# Patient Record
Sex: Male | Born: 1960 | Race: Black or African American | Hispanic: No | Marital: Single | Smoking: Never smoker
Health system: Southern US, Community
[De-identification: ages and names within clinical notes are randomized; demographics above are authoritative.]

## PROBLEM LIST (undated history)

## (undated) DIAGNOSIS — E785 Hyperlipidemia, unspecified: Secondary | ICD-10-CM

## (undated) DIAGNOSIS — I639 Cerebral infarction, unspecified: Secondary | ICD-10-CM

## (undated) DIAGNOSIS — I10 Essential (primary) hypertension: Secondary | ICD-10-CM

## (undated) DIAGNOSIS — Z789 Other specified health status: Secondary | ICD-10-CM

## (undated) DIAGNOSIS — E871 Hypo-osmolality and hyponatremia: Secondary | ICD-10-CM

## (undated) DIAGNOSIS — E119 Type 2 diabetes mellitus without complications: Secondary | ICD-10-CM

## (undated) HISTORY — DX: Type 2 diabetes mellitus without complications: E11.9

## (undated) HISTORY — DX: Essential (primary) hypertension: I10

## (undated) HISTORY — PX: MANDIBLE FRACTURE SURGERY: SHX706

## (undated) HISTORY — DX: Cerebral infarction, unspecified: I63.9

## (undated) HISTORY — DX: Hypo-osmolality and hyponatremia: E87.1

## (undated) HISTORY — DX: Hyperlipidemia, unspecified: E78.5

## (undated) HISTORY — PX: NO PAST SURGERIES: SHX2092

---

## 2020-09-11 ENCOUNTER — Other Ambulatory Visit: Payer: Self-pay

## 2020-09-11 ENCOUNTER — Encounter: Payer: Self-pay | Admitting: Emergency Medicine

## 2020-09-11 ENCOUNTER — Inpatient Hospital Stay
Admission: EM | Admit: 2020-09-11 | Discharge: 2020-09-12 | DRG: 065 | Disposition: A | Discharge status: Other Acute Inpatient Hospital | Payer: BC Managed Care – PPO | Attending: Internal Medicine | Admitting: Internal Medicine

## 2020-09-11 ENCOUNTER — Emergency Department: Payer: BC Managed Care – PPO

## 2020-09-11 DIAGNOSIS — G8191 Hemiplegia, unspecified affecting right dominant side: Secondary | ICD-10-CM | POA: Diagnosis present

## 2020-09-11 DIAGNOSIS — Z20822 Contact with and (suspected) exposure to covid-19: Secondary | ICD-10-CM | POA: Diagnosis present

## 2020-09-11 DIAGNOSIS — I10 Essential (primary) hypertension: Secondary | ICD-10-CM | POA: Diagnosis not present

## 2020-09-11 DIAGNOSIS — H532 Diplopia: Secondary | ICD-10-CM | POA: Diagnosis present

## 2020-09-11 DIAGNOSIS — Z8673 Personal history of transient ischemic attack (TIA), and cerebral infarction without residual deficits: Secondary | ICD-10-CM

## 2020-09-11 DIAGNOSIS — R278 Other lack of coordination: Secondary | ICD-10-CM | POA: Diagnosis present

## 2020-09-11 DIAGNOSIS — I7410 Embolism and thrombosis of unspecified parts of aorta: Secondary | ICD-10-CM | POA: Diagnosis not present

## 2020-09-11 DIAGNOSIS — R471 Dysarthria and anarthria: Secondary | ICD-10-CM | POA: Diagnosis present

## 2020-09-11 DIAGNOSIS — I639 Cerebral infarction, unspecified: Secondary | ICD-10-CM

## 2020-09-11 DIAGNOSIS — R270 Ataxia, unspecified: Secondary | ICD-10-CM | POA: Diagnosis not present

## 2020-09-11 DIAGNOSIS — I517 Cardiomegaly: Secondary | ICD-10-CM | POA: Diagnosis not present

## 2020-09-11 DIAGNOSIS — I63441 Cerebral infarction due to embolism of right cerebellar artery: Secondary | ICD-10-CM

## 2020-09-11 DIAGNOSIS — Z841 Family history of disorders of kidney and ureter: Secondary | ICD-10-CM | POA: Diagnosis not present

## 2020-09-11 DIAGNOSIS — R9431 Abnormal electrocardiogram [ECG] [EKG]: Secondary | ICD-10-CM | POA: Diagnosis not present

## 2020-09-11 DIAGNOSIS — R7301 Impaired fasting glucose: Secondary | ICD-10-CM | POA: Diagnosis not present

## 2020-09-11 DIAGNOSIS — E785 Hyperlipidemia, unspecified: Secondary | ICD-10-CM | POA: Diagnosis not present

## 2020-09-11 DIAGNOSIS — R410 Disorientation, unspecified: Secondary | ICD-10-CM | POA: Diagnosis not present

## 2020-09-11 DIAGNOSIS — R29706 NIHSS score 6: Secondary | ICD-10-CM | POA: Diagnosis present

## 2020-09-11 DIAGNOSIS — I63549 Cerebral infarction due to unspecified occlusion or stenosis of unspecified cerebellar artery: Secondary | ICD-10-CM | POA: Diagnosis not present

## 2020-09-11 DIAGNOSIS — J189 Pneumonia, unspecified organism: Secondary | ICD-10-CM | POA: Diagnosis not present

## 2020-09-11 DIAGNOSIS — F1210 Cannabis abuse, uncomplicated: Secondary | ICD-10-CM | POA: Diagnosis present

## 2020-09-11 HISTORY — DX: Other specified health status: Z78.9

## 2020-09-11 LAB — URINALYSIS, COMPLETE (UACMP) WITH MICROSCOPIC
Bacteria, UA: NONE SEEN
Bilirubin Urine: NEGATIVE
Glucose, UA: NEGATIVE mg/dL
Ketones, ur: 5 mg/dL — AB
Leukocytes,Ua: NEGATIVE
Nitrite: NEGATIVE
Protein, ur: NEGATIVE mg/dL
Specific Gravity, Urine: 1.036 — ABNORMAL HIGH (ref 1.005–1.030)
Squamous Epithelial / HPF: NONE SEEN (ref 0–5)
pH: 6 (ref 5.0–8.0)

## 2020-09-11 LAB — BASIC METABOLIC PANEL
Anion gap: 15 (ref 5–15)
BUN: 12 mg/dL (ref 6–20)
CO2: 22 mmol/L (ref 22–32)
Calcium: 9.7 mg/dL (ref 8.9–10.3)
Chloride: 96 mmol/L — ABNORMAL LOW (ref 98–111)
Creatinine, Ser: 0.9 mg/dL (ref 0.61–1.24)
GFR, Estimated: >60 mL/min (ref >60)
Glucose, Bld: 186 mg/dL — ABNORMAL HIGH (ref 70–99)
Potassium: 3.8 mmol/L (ref 3.5–5.1)
Sodium: 133 mmol/L — ABNORMAL LOW (ref 135–145)

## 2020-09-11 LAB — CBC
HCT: 43.3 % (ref 39.0–52.0)
Hemoglobin: 14.7 g/dL (ref 13.0–17.0)
MCH: 32 pg (ref 26.0–34.0)
MCHC: 33.9 g/dL (ref 30.0–36.0)
MCV: 94.3 fL (ref 80.0–100.0)
Platelets: 335 10*3/uL (ref 150–400)
RBC: 4.59 MIL/uL (ref 4.22–5.81)
RDW: 12.8 % (ref 11.5–15.5)
WBC: 6.5 10*3/uL (ref 4.0–10.5)
nRBC: 0 % (ref 0.0–0.2)

## 2020-09-11 LAB — RESP PANEL BY RT-PCR (FLU A&B, COVID) ARPGX2
Influenza A by PCR: NEGATIVE
Influenza B by PCR: NEGATIVE
SARS Coronavirus 2 by RT PCR: NEGATIVE

## 2020-09-11 LAB — TROPONIN I (HIGH SENSITIVITY)
Troponin I (High Sensitivity): 30 ng/L — ABNORMAL HIGH (ref <18)
Troponin I (High Sensitivity): 52 ng/L — ABNORMAL HIGH (ref <18)

## 2020-09-11 LAB — PROTIME-INR
INR: 1 (ref 0.8–1.2)
Prothrombin Time: 13 seconds (ref 11.4–15.2)

## 2020-09-11 LAB — APTT: aPTT: 26 seconds (ref 24–36)

## 2020-09-11 IMAGING — CT CT HEAD W/O CM
3 series · 15 of 47 positions shown, 18 images · non-contrast
Comparison: None.

CLINICAL DATA: Confusion began yesterday. Difficulty with thoughts.
Tired. Numbness in the left upper extremity.

EXAM:
CT HEAD WITHOUT CONTRAST
TECHNIQUE: Contiguous axial images were obtained from the base of the skull
through the vertex without intravenous contrast.

[Series 3: head wo · axial · 0.41mm/px · z∈[-133,-8]mm · 9 of 30 slices shown, 12 images]
[im 3/30  brain]
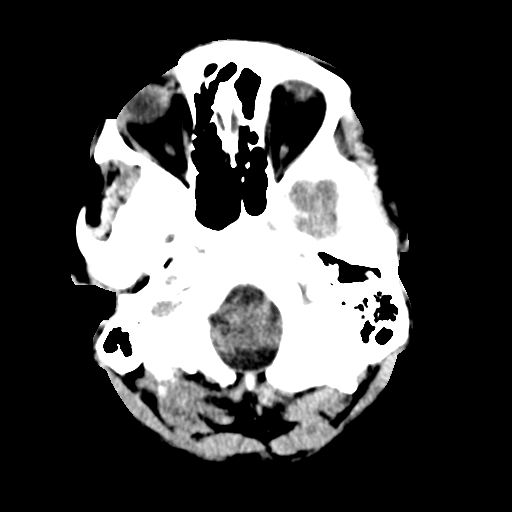
[im 3/30  bone]
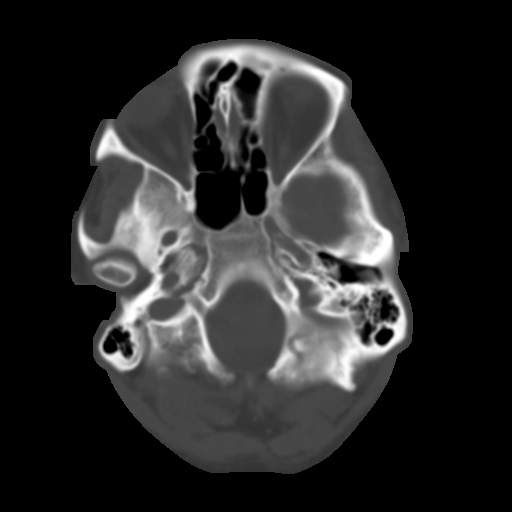
[im 6/30  brain]
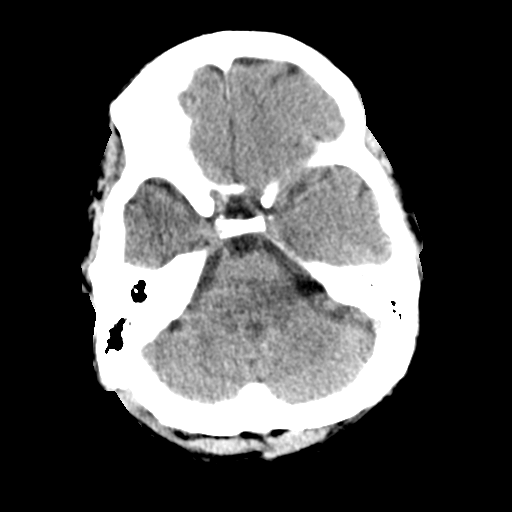
[im 9/30  brain]
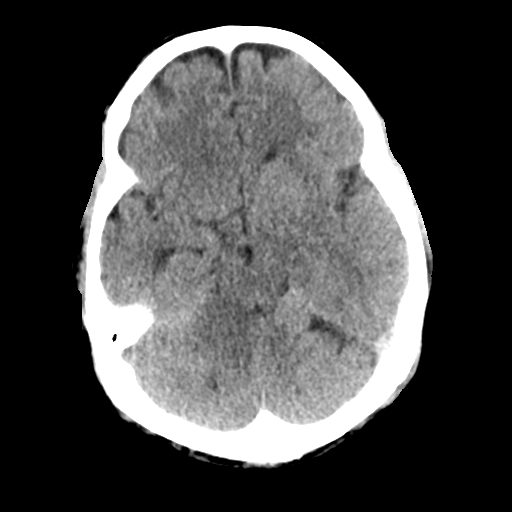
[im 12/30  brain]
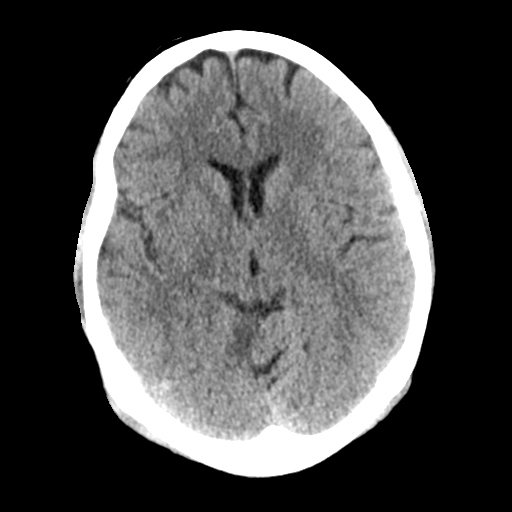
[im 16/30  brain]
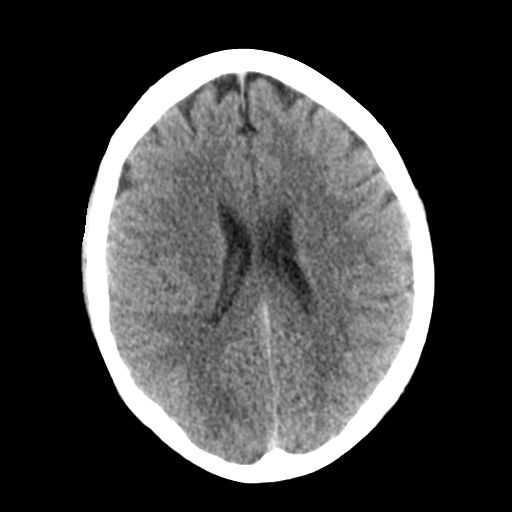
[im 16/30  bone]
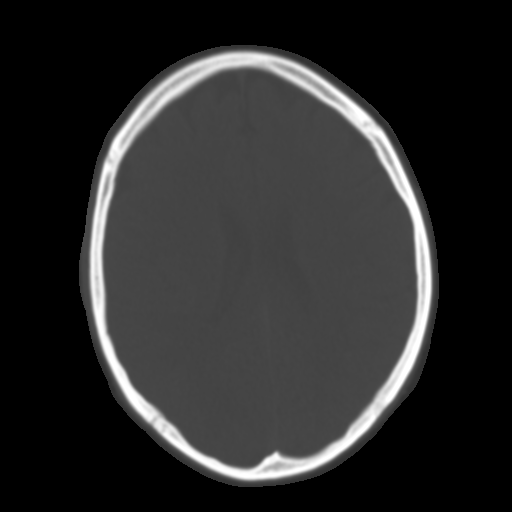
[im 19/30  brain]
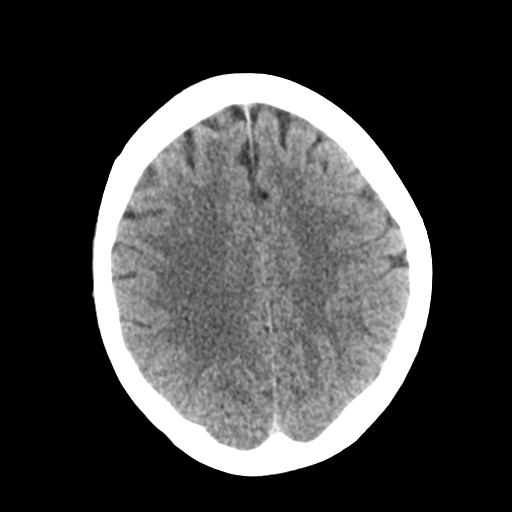
[im 22/30  brain]
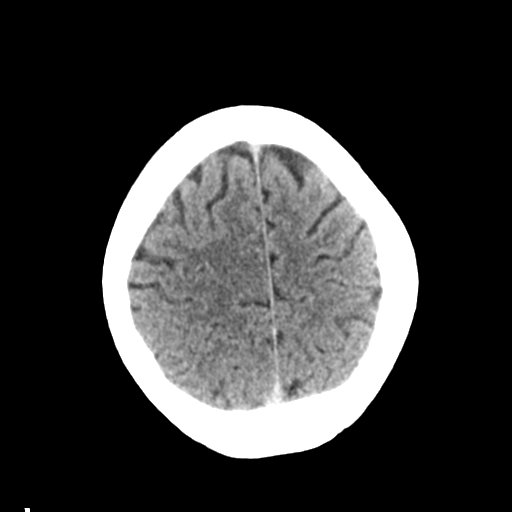
[im 25/30  brain]
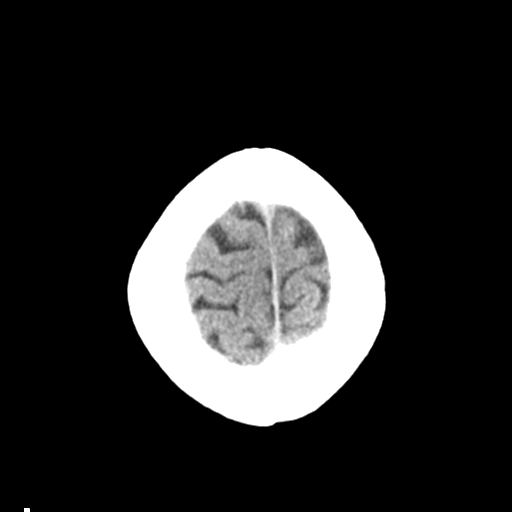
[im 28/30  brain]
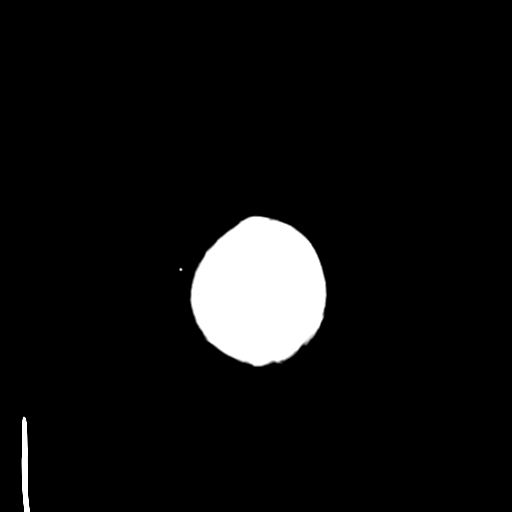
[im 28/30  bone]
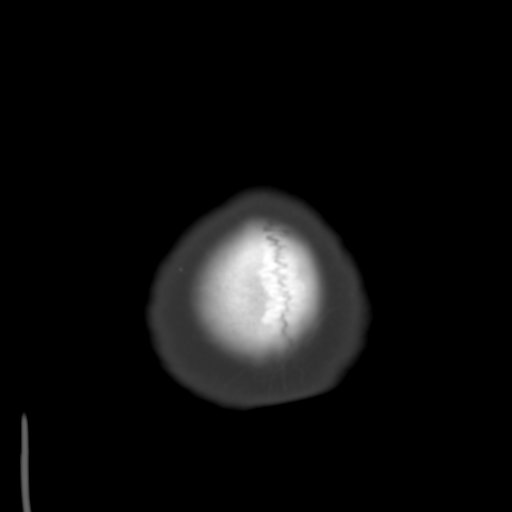

[Series 4: coronal soft tissue · coronal · 0.33mm/px · 3 of 67 slices shown]
[im 23/67  brain]
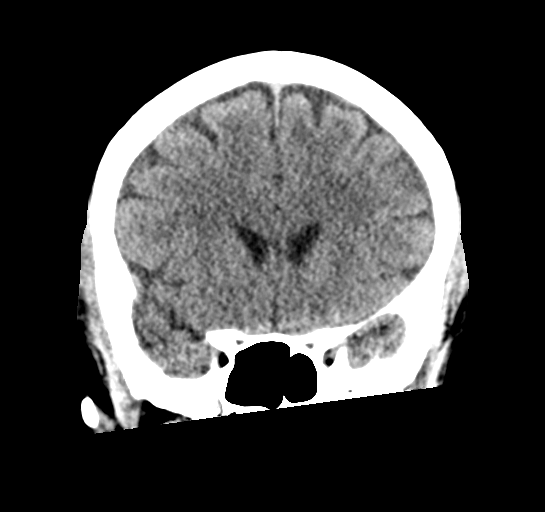
[im 30/67  brain]
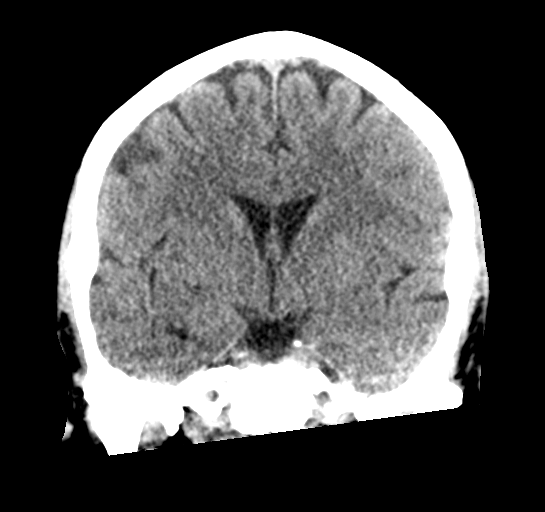
[im 37/67  brain]
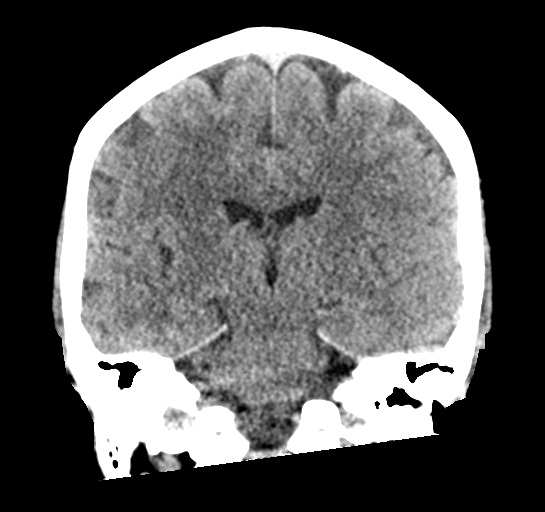

[Series 5: sagittal soft tissue · sagittal · 0.33mm/px · 3 of 56 slices shown]
[im 19/56  brain]
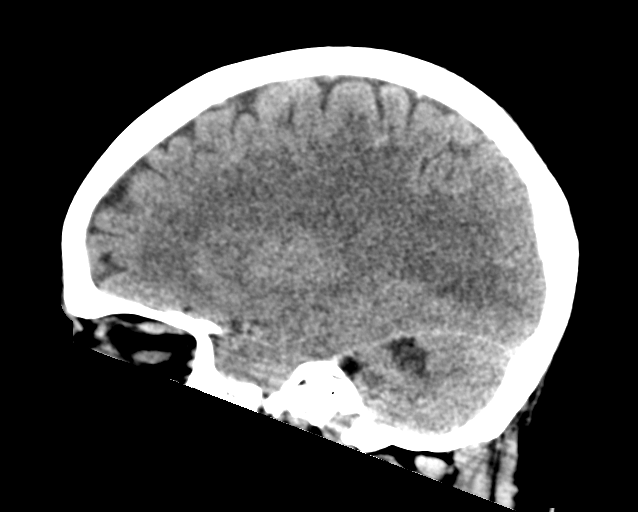
[im 28/56  brain]
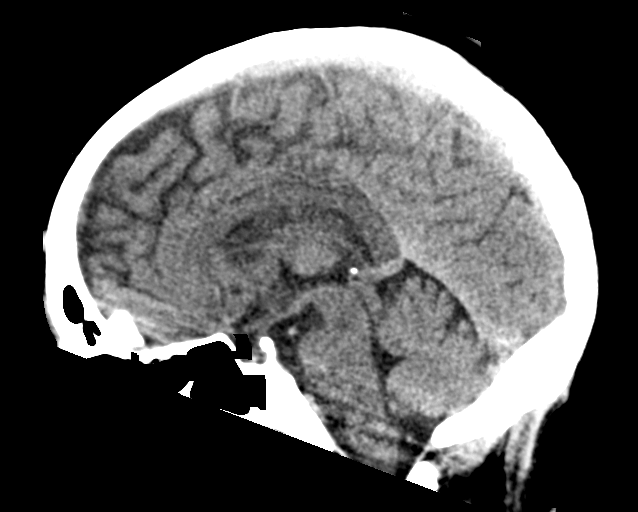
[im 37/56  brain]
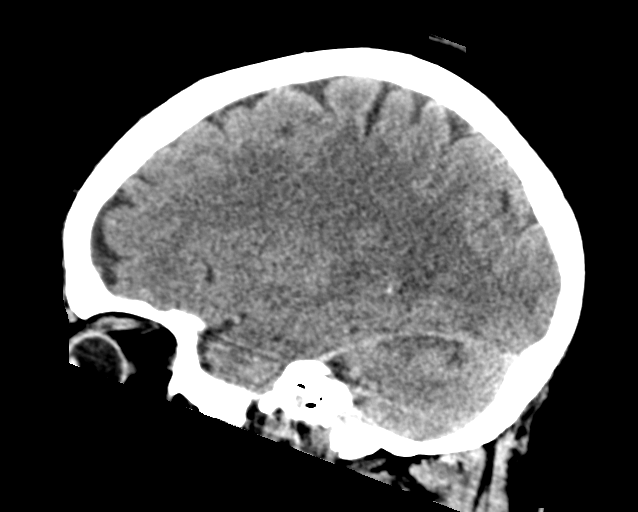

[15 of 47 positions shown; findings below may reference images not displayed]

FINDINGS: Brain: Remote left superior cerebellar infarct is noted with volume
loss.

An acute/subacute right superior cerebellar infarct is present loss
of gray-white differentiation and effacement of sulci. No acute
hemorrhage or other mass lesion is present. No other acute or focal
cortical abnormality is present.

The ventricles are of normal size. No significant extraaxial fluid
collection is present.

Vascular: Atherosclerotic changes are present the cavernous internal
carotid arteries. No hyperdense vessel is present.

Skull: Calvarium is intact. No focal lytic or blastic lesions are
present. No significant extracranial soft tissue lesion is present.

Sinuses/Orbits: The paranasal sinuses and mastoid air cells are
clear. The globes and orbits are within normal limits.
IMPRESSION: 1. Acute/subacute nonhemorrhagic right superior cerebellar infarct.
2. Remote left superior cerebellar infarct.
3. Atherosclerosis.

These results were called by telephone at the time of interpretation
acknowledged these results.

## 2020-09-11 IMAGING — CR DG CHEST 2V
1 series · 3 of 3 positions shown · non-contrast
Comparison: None.

CLINICAL DATA: Pneumonia, confusion

EXAM:
CHEST - 2 VIEW

[Series 1: w chest lat · 0.14mm/px · 3 of 3 slices shown]
[im 1/3]
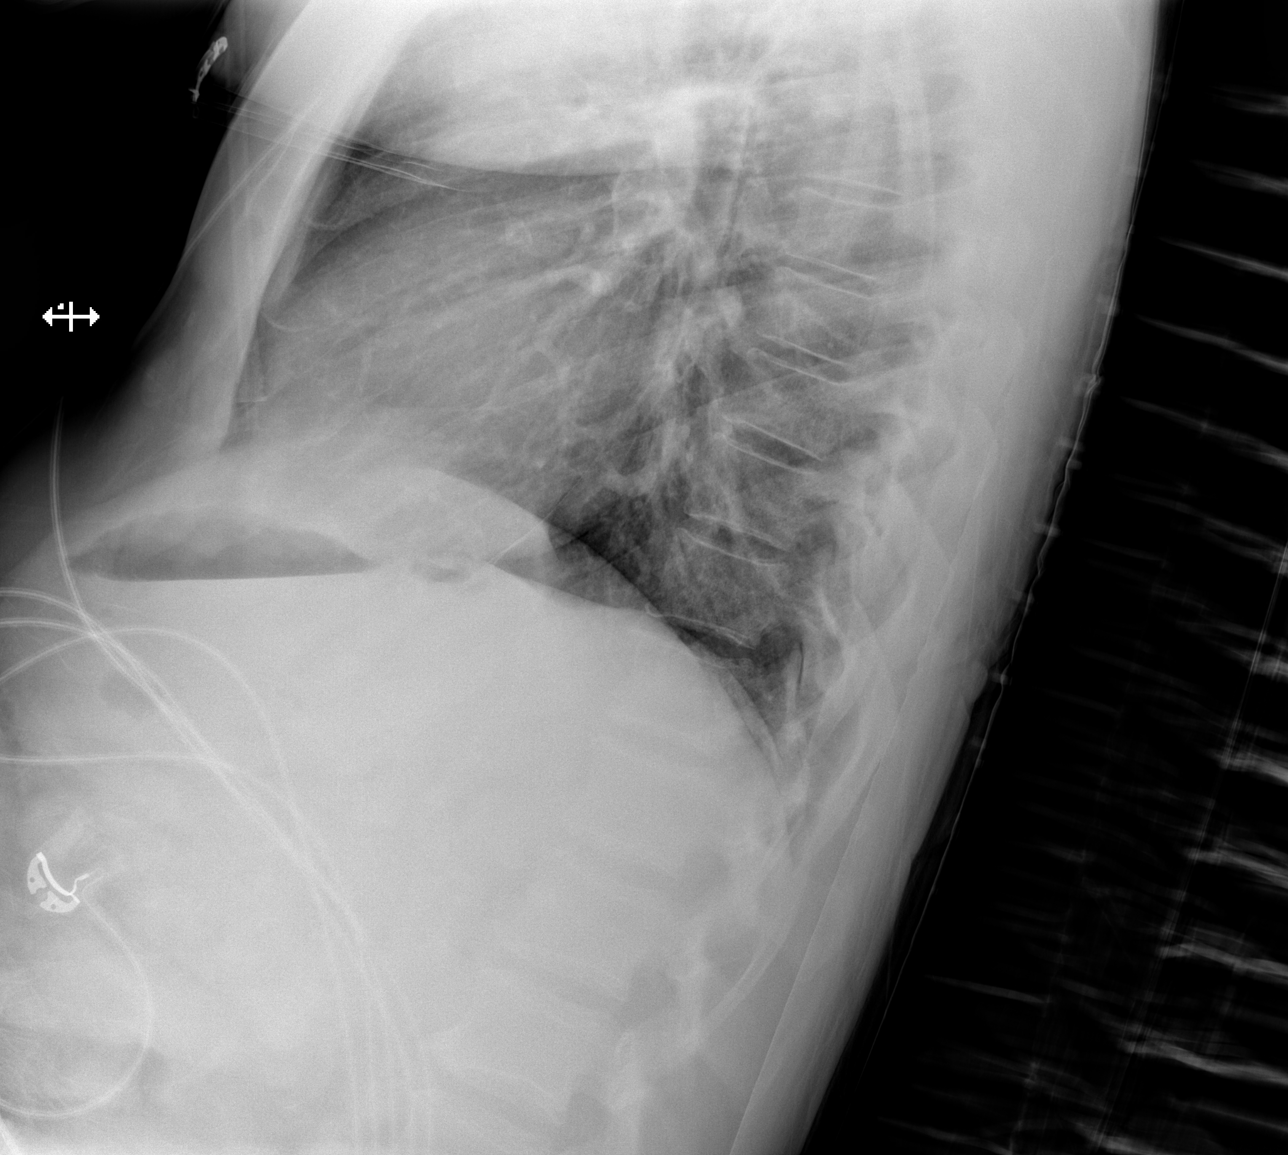
[im 2/3]
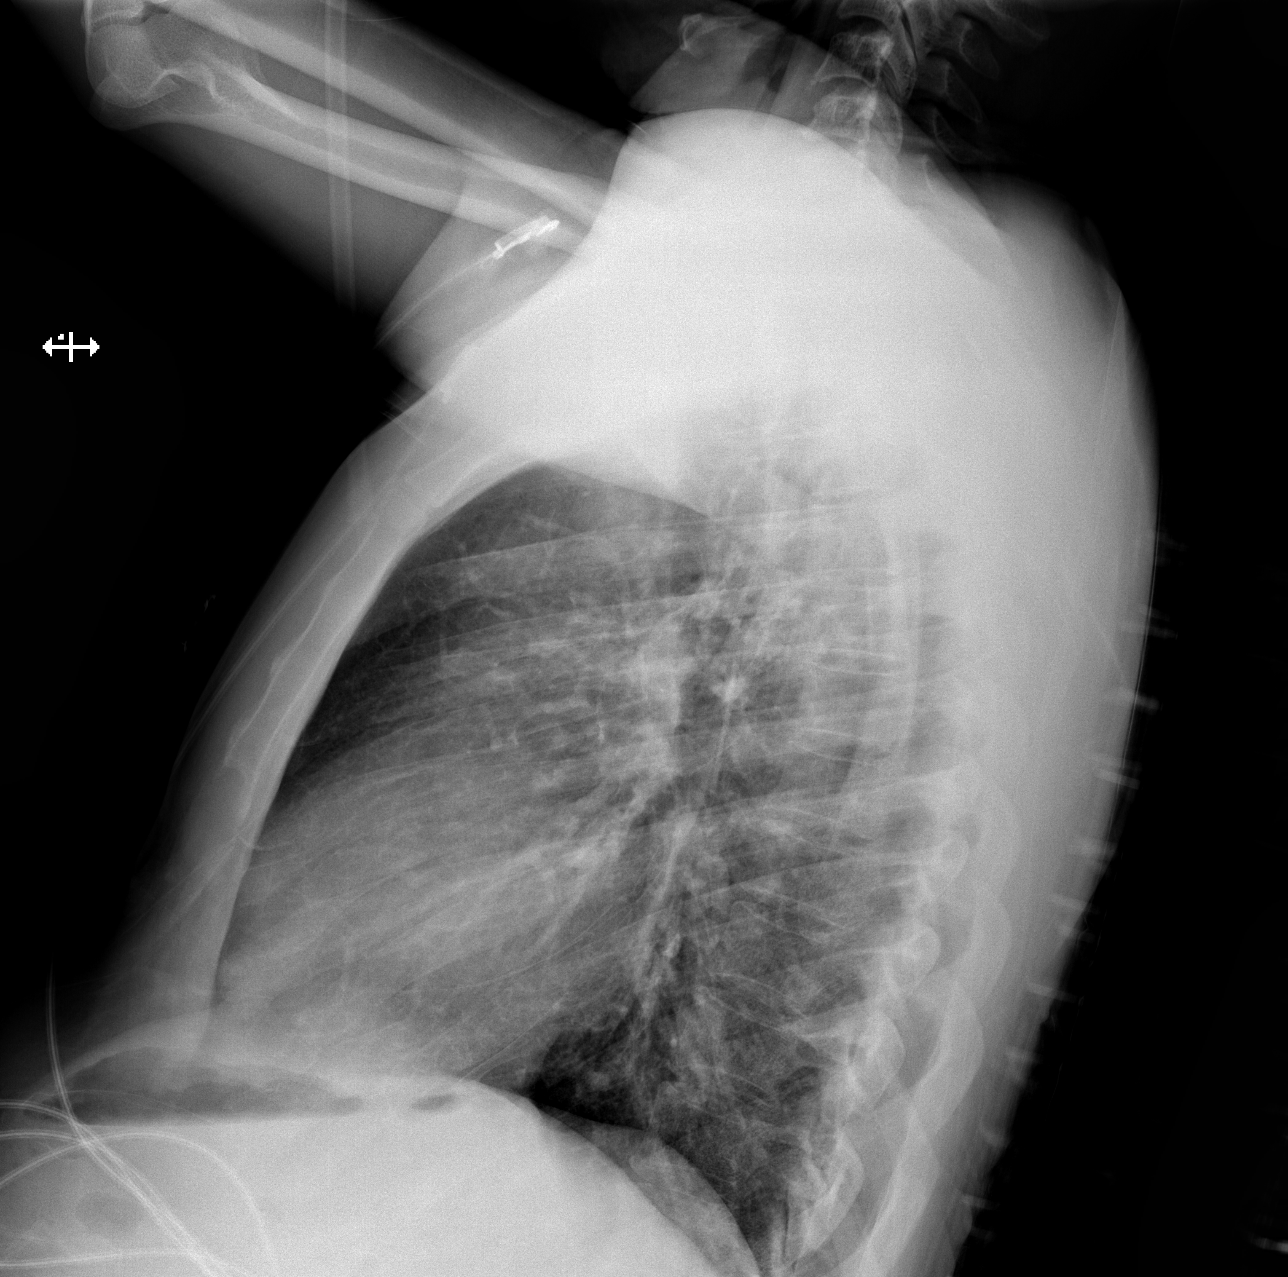
[im 3/3]
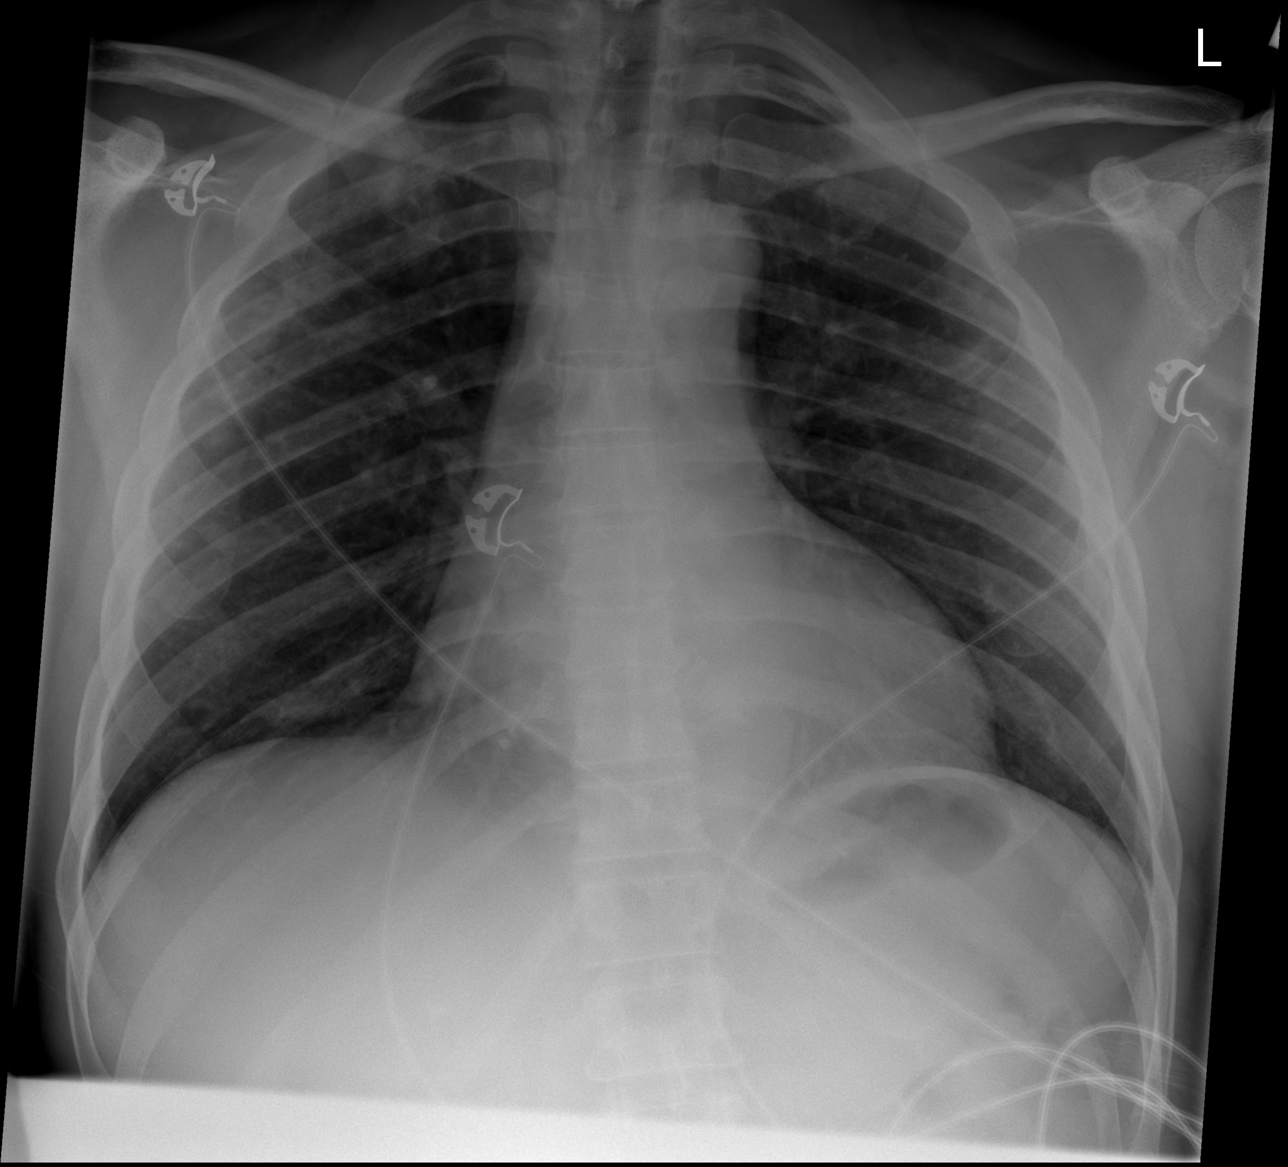

[3 of 3 positions shown; findings below may reference images not displayed]

FINDINGS: Cardiomegaly. Subtle heterogeneous airspace opacity of the
peripheral right upper lobe. The visualized skeletal structures are
unremarkable.
IMPRESSION: 1. Subtle heterogeneous airspace opacity of the peripheral right
upper lobe, concerning for infection.
2. Cardiomegaly.

## 2020-09-11 MED ORDER — IOHEXOL 350 MG/ML SOLN
100.0000 mL | Freq: Once | INTRAVENOUS | Status: AC | PRN
  Administered 2020-09-11: 100 mL via INTRAVENOUS

## 2020-09-11 MED ORDER — HYDRALAZINE HCL 20 MG/ML IJ SOLN
5.0000 mg | INTRAMUSCULAR | Status: DC | PRN
  Administered 2020-09-11 – 2020-09-12 (×5): 5 mg via INTRAVENOUS
  Filled 2020-09-11 (×4): qty 1

## 2020-09-11 MED ORDER — HEPARIN (PORCINE) 25000 UT/250ML-% IV SOLN
1350.0000 [IU]/h | INTRAVENOUS | Status: DC
  Administered 2020-09-11: 900 [IU]/h via INTRAVENOUS
  Filled 2020-09-11 (×2): qty 250

## 2020-09-11 MED ORDER — CLOPIDOGREL BISULFATE 75 MG PO TABS
75.0000 mg | ORAL_TABLET | Freq: Every day | ORAL | Status: DC
  Administered 2020-09-11: 75 mg via ORAL
  Filled 2020-09-11: qty 1

## 2020-09-11 MED ORDER — ASPIRIN EC 81 MG PO TBEC
81.0000 mg | DELAYED_RELEASE_TABLET | Freq: Every day | ORAL | Status: DC
  Administered 2020-09-11: 81 mg via ORAL
  Filled 2020-09-11: qty 1

## 2020-09-11 NOTE — Plan of Care (Signed)
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 From a neurological standpoint, he needs anticoagulation-heparin has been started. I spoke with Dr. Charna Archer again, who has reached out to [facility] hospitalist for admission. I will continue to recommend stepdown admission. Patient is likely going to go to [facility]. I will notify my team over at [facility] to follow him up in consultation once he arrives.   -- Amie Portland, MD Neurologist [facility] Pager: [phone]

## 2020-09-11 NOTE — ED Provider Notes (Signed)
[facility] Emergency Department Provider Note   ____________________________________________   Event Date/Time   First MD Initiated Contact with Patient 09/11/20 1048     (approximate)  I have reviewed the triage vital signs and the nursing notes.   HISTORY  Chief Complaint Weakness    HPI Malik Jones is a 60 y.o. male with no significant past medical history presents to the ED complaining of weakness.  Patient reports that around 8 PM last night he suddenly noticed that his left side felt numb and weak.  He thought he might be feeling better but then woke up this morning with ongoing symptoms and so decided to seek care.  He also feels like his "equilibrium" is off in both of his legs.  He states he has been very unsteady walking as well as when trying to use either arm.  He denies any vision changes or speech changes.  He has never had similar symptoms in the past.  He does not take any medications on a regular basis, denies alcohol or drug use.        History reviewed. No pertinent past medical history.  Patient Active Problem List   Diagnosis Date Noted   Embolic stroke ([place]) [id number]     History reviewed. No pertinent surgical history.  Prior to Admission medications   Not on File    Allergies Patient has no known allergies.  No family history on file.  Social History    Review of Systems  Constitutional: No fever/chills Eyes: No visual changes. ENT: No sore throat. Cardiovascular: Denies chest pain. Respiratory: Denies shortness of breath. Gastrointestinal: No abdominal pain.  No nausea, no vomiting.  No diarrhea.  No constipation. Genitourinary: Negative for dysuria. Musculoskeletal: Negative for back pain. Skin: Negative for rash. Neurological: Negative for headaches, positive for left-sided numbness and weakness.  Positive for loss of coordination.  ____________________________________________   PHYSICAL  EXAM:  VITAL SIGNS: ED Triage Vitals  Enc Vitals Group     BP 09/11/20 0941 (!) 185/98     Pulse Rate 09/11/20 0941 76     Resp 09/11/20 0941 18     Temp 09/11/20 0940 97.9 F (36.6 C)     Temp Source 09/11/20 0940 Oral     SpO2 09/11/20 0941 100 %     Weight 09/11/20 0943 163 lb (73.9 kg)     Height 09/11/20 0943 '6\' 3"'$  (1.905 m)     Head Circumference --      Peak Flow --      Pain Score 09/11/20 0943 0     Pain Loc --      Pain Edu? --      Excl. in [place]? --     Constitutional: Alert and oriented. Eyes: Conjunctivae are normal. Head: Atraumatic. Nose: No congestion/rhinnorhea. Mouth/Throat: Mucous membranes are moist. Neck: Normal ROM Cardiovascular: Normal rate, regular rhythm. Grossly normal heart sounds. Respiratory: Normal respiratory effort.  No retractions. Lungs CTAB. Gastrointestinal: Soft and nontender. No distention. Genitourinary: deferred Musculoskeletal: No lower extremity tenderness nor edema. Neurologic:  Normal speech and language.  Strength 5 out of 5 in bilateral upper and lower extremities.  Dysmetria noted with finger-nose and heel-to-shin testing in all 4 extremities, right greater than left. Skin:  Skin is warm, dry and intact. No rash noted. Psychiatric: Mood and affect are normal. Speech and behavior are normal.  ____________________________________________   LABS (all labs ordered are listed, but only abnormal results are displayed)  Labs Reviewed  BASIC METABOLIC PANEL - Abnormal; Notable for the following components:      Result Value   Sodium 133 (*)    Chloride 96 (*)    Glucose, Bld 186 (*)    All other components within normal limits  TROPONIN I (HIGH SENSITIVITY) - Abnormal; Notable for the following components:   Troponin I (High Sensitivity) 30 (*)    All other components within normal limits  TROPONIN I (HIGH SENSITIVITY) - Abnormal; Notable for the following components:   Troponin I (High Sensitivity) 52 (*)    All other  components within normal limits  RESP PANEL BY RT-PCR (FLU A&B, COVID) ARPGX2  CBC  PROTIME-INR  APTT  URINALYSIS, COMPLETE (UACMP) WITH MICROSCOPIC  CBG MONITORING, ED   ____________________________________________  EKG  ED ECG REPORT I, Blake Divine, the attending physician, personally viewed and interpreted this ECG.   Date: 09/11/2020  EKG Time: 9:46  Rate: 63  Rhythm: normal sinus rhythm  Axis: Normal  Intervals:none  ST&T Change: Possible LVH   PROCEDURES  Procedure(s) performed (including Critical Care):  .Critical Care  Date/Time: 09/11/2020 3:14 PM Performed by: Blake Divine, MD Authorized by: Blake Divine, MD   Critical care provider statement:    Critical care time (minutes):  75   Critical care time was exclusive of:  Separately billable procedures and treating other patients and teaching time   Critical care was necessary to treat or prevent imminent or life-threatening deterioration of the following conditions:  CNS failure or compromise   Critical care was time spent personally by me on the following activities:  Discussions with consultants, evaluation of patient's response to treatment, examination of patient, ordering and performing treatments and interventions, ordering and review of laboratory studies, ordering and review of radiographic studies, pulse oximetry, re-evaluation of patient's condition, obtaining history from patient or surrogate and review of old charts   I assumed direction of critical care for this patient from another provider in my specialty: no     Care discussed with: accepting provider at another facility     ____________________________________________   [place] / ASSESSMENT AND PLAN / ED COURSE      60 year old male with no significant past medical history presents to the ED complaining of acute onset left-sided numbness and weakness around 8 PM last night, has also noticed that his "equilibrium" has been  off.  Patient with significant dysmetria on exam although he does not appear to have any focal weakness.  CT head reviewed by me and is concerning for acute/subacute cerebellar stroke.  Findings discussed with neurology, who will evaluate the patient and has placed orders for CT angiogram with perfusion.  Neurology states that we may hold off on code stroke for now, patient is not a candidate for tPA given he is outside of the window.  CTA does not show central large vessel occlusion but is concerning for aortic a sending thrombosis.  In further discussion with vascular surgery and neurology, we will start patient on heparin, however due to risk of potential complication patient would be best served by transfer to tertiary care center.  Case discussed with hospitalist service at [facility] and patient accepted for transfer by Dr. Roosevelt Locks.  There is no indication for intervention by cardiothoracic surgery at this time, however patient would be best served at facility with CT surgery available.  He remained stable at this time with no new complaints.      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    Blake Divine, MD 09/11/20 [phone]

## 2020-09-11 NOTE — Consult Note (Signed)
ANTICOAGULATION CONSULT NOTE - Initial Consult  Pharmacy Consult for Heparin infusion Indication: stroke and aortic thrombus  No Known Allergies  Patient Measurements: Height: '6\' 3"'$  (190.5 cm) Weight: 73.9 kg (163 lb) IBW/kg (Calculated) : 84.5 Heparin Dosing Weight: 73.9 kg  Vital Signs: Temp: 97.9 F (36.6 C) (08/03 0941) Temp Source: Oral (08/03 0941) BP: 179/113 (08/03 1400) Pulse Rate: 73 (08/03 1400)  Labs: Recent Labs    09/11/20 0939 09/11/20 1324 09/11/20 1452  HGB 14.7  --   --   HCT 43.3  --   --   PLT 335  --   --   APTT  --   --  26  LABPROT 13.0  --   --   INR 1.0  --   --   CREATININE 0.90  --   --   TROPONINIHS 30* 52*  --     Estimated Creatinine Clearance: 93.5 mL/min (by C-G formula based on SCr of 0.9 mg/dL).   Medical History: History reviewed. No pertinent past medical history.  Medications:  (Not in a hospital admission)  Scheduled:  Infusions:   heparin 900 Units/hr (09/11/20 1451)   PRN: hydrALAZINE Anti-infectives (From admission, onward)    None       Assessment: Pharmacy has been consulted to initiate heparin infusion in 60yo male who presented to the ED with left sided weakness. CT of head c/f acute/subacute cerebellar stroke. Patient not a candidate for tPA as he is outside of the window, per Neurologist. Plan is for patient to transfer to [facility] once able. Baseline labs: INR 1.0, aPTT 26sec, Plts 335, hgb 14.7.   Goal of Therapy:  Heparin level 0.3-0.7 units/ml Monitor platelets by anticoagulation protocol: Yes   Plan:   Per stroke protocol, will avoid bolus. Start heparin infusion at 900 units/hr Check anti-Xa level in 6 hours and daily while on heparin Continue to monitor H&H and platelets  Brielyn Bosak A Clever Geraldo 09/11/2020,3:23 PM

## 2020-09-11 NOTE — ED Triage Notes (Addendum)
Pt here with confusion that started yesterday. Pt states that he is having trouble getting his thoughts together. Pt denies pain but says that he is tired. Pt now saying that his left arm is a little numb. NO facial droop, mild slurred speech but pt states that is due to being tired.

## 2020-09-11 NOTE — ED Notes (Signed)
Awaiting bed assignment at [facility]

## 2020-09-11 NOTE — ED Notes (Signed)
Pt was very unsteady when getting up from wheelchair to bed. Pt reports using "blunt" yesterday and alcohol use, states feeling bad after blunt.

## 2020-09-11 NOTE — ED Notes (Signed)
First Nurse Note: Pt to ED via [place] stating that he "cannot walk". Pt does not appear to be in any distress at this time.

## 2020-09-11 NOTE — ED Notes (Signed)
[place] sent to the lab.  

## 2020-09-11 NOTE — ED Notes (Signed)
Report received from [place], [place]

## 2020-09-11 NOTE — Consult Note (Signed)
Neurology Consultation  Reason for Consult: Stroke Referring Physician: Dr. Charna Archer  CC: Right-sided clumsiness  History is obtained from: Patient  HPI: Malik Jones is a 60 y.o. male no significant past medical history other than marijuana abuse, presenting to the emergency room for evaluation of unsteadiness and clumsiness on the right side. Reports last known well somewhere around 8 PM yesterday and started noticing that he is not able to walk normally because he is very discoordinated on the right side.  Also feels that his right side is weak compared to the left.  Reports left-sided arm and leg numbness as well.   No history of strokes in the past. Reports no nausea or vomiting or headache. Reports mild diplopia as well. Denies cocaine or stimulant abuse.  Reports marijuana abuse.  Denies tobacco abuse. ROS + dizziness.   LKW: 8 PM yesterday tpa given?: no, the window Premorbid modified Rankin scale (mRS): 0  ROS: Full ROS was performed and is negative except as noted in the HPI.   History reviewed. No pertinent past medical history.      No family history on file.   Social History:   has no history on file for tobacco use, alcohol use, and drug use.  Medications No current facility-administered medications for this encounter. No current outpatient medications on file.   Exam: Current vital signs: BP (!) 166/105   Pulse 64   Temp 97.9 F (36.6 C) (Oral)   Resp 14   Ht '6\' 3"'$  (1.905 m)   Wt 73.9 kg   SpO2 99%   BMI 20.37 kg/m  Vital signs in last 24 hours: Temp:  [97.9 F (36.6 C)] 97.9 F (36.6 C) (08/03 0941) Pulse Rate:  [64-76] 64 (08/03 1100) Resp:  [14-18] 14 (08/03 1100) BP: (166-185)/(98-105) 166/105 (08/03 1100) SpO2:  [99 %-100 %] 99 % (08/03 1100) Weight:  [73.9 kg] 73.9 kg (08/03 0943)  GENERAL: Awake, alert in NAD HEENT: - Normocephalic and atraumatic, dry mm, no LN++, no Thyromegally LUNGS - Clear to auscultation bilaterally with no  wheezes CV - S1S2 RRR, no m/r/g, equal pulses bilaterally. ABDOMEN - Soft, nontender, nondistended with normoactive BS Ext: warm, well perfused, intact peripheral pulses, intact skin with no edema  NEURO:  Mental Status: AA&Ox3  Language: speech is mildly dysarthric.  Naming, repetition, fluency, and comprehension intact. Cranial Nerves: PERRL. EOMI with no nystagmus, visual fields full, no facial asymmetry, facial sensation intact, hearing intact, tongue/uvula/soft palate midline, normal sternocleidomastoid and trapezius muscle strength. No evidence of tongue atrophy or fibrillations Motor: Right upper and lower extremity are mildly weak 4/5 with mild vertical drift and apparent ataxia even while trying to vertically lift from bed.  Left side normal. Tone: is normal and bulk is normal Sensation-mildly diminished left  sensation Coordination: [place] ataxic and right upper and lower extremity Gait- deferred  NIHSS 1a Level of Conscious.: 0 1b LOC Questions: 0 1c LOC Commands: 0 2 Best Gaze: 0 3 Visual: 0 4 Facial Palsy: 0 5a Motor Arm - left: 0 5b Motor Arm - Right: 1 6a Motor Leg - Left: 0 6b Motor Leg - Right: 1 7 Limb Ataxia: 2 8 Sensory: 1 9 Best Language: 0 10 Dysarthria: 1 11 Extinct. and Inatten.: 0 TOTAL: 6    Labs I have reviewed labs in epic and the results pertinent to this consultation are:   CBC    Component Value Date/Time   WBC 6.5 09/11/2020 0939   RBC 4.59 09/11/2020 0939  HGB 14.7 09/11/2020 0939   HCT 43.3 09/11/2020 0939   PLT 335 09/11/2020 0939   MCV 94.3 09/11/2020 0939   MCH 32.0 09/11/2020 0939   MCHC 33.9 09/11/2020 0939   RDW 12.8 09/11/2020 0939    CMP     Component Value Date/Time   NA 133 (L) 09/11/2020 0939   K 3.8 09/11/2020 0939   CL 96 (L) 09/11/2020 0939   CO2 22 09/11/2020 0939   GLUCOSE 186 (H) 09/11/2020 0939   BUN 12 09/11/2020 0939   CREATININE 0.90 09/11/2020 0939   CALCIUM 9.7 09/11/2020 0939   GFRNONAA >60  09/11/2020 0939    Imaging I have reviewed the images obtained: CT-head: Acute/subacute right superior cerebellar infarction as evidenced by a formed hypodensity.  No bleed. Remote left superior cerebellar stroke   Assessment:  60 year old with no significant past medical history presenting for sudden onset of gait disturbance, right-sided clumsiness and on examination is noted to have mild right hemiparesis and right-sided ataxia.  CT head concerning for a subacute right superior cerebellar infarct.  I suspect some brainstem involvement given crossed findings of sensory loss on the left. Needs further work-up  Impression: Acute ischemic stroke-etiology under investigation  Recommendations: Stat CTA head and neck and CT perfusion Frequent neurochecks Telemetry Dual antiplatelets-duration to be decided after head and neck vessel imaging High-dose statin MRI brain without contrast A1c Lipid panel PT OT Speech therapy Permissive hypertension-allow blood pressures to right high and treat only if systolics greater than [id number] on a as needed basis. Urinary toxicology screen Discussed with Dr. Charna Archer Will follow   ADDENDUM after CTA head and neck      CTA head and neck-suboptimal bolus timing but shows adherent or free floating thrombus in the ascending aorta.   Also concerning   ADDITIONAL RECS -Heparin drip-stroke protocol. I have canceled DAPT orders since he will be on anticoagulation. -MRI brain when able to -Will switch to PO DOAC if tolerates anticoagulation well and exam remains stable for 24h.  D/w Dr. Charna Archer. Patient prefers [facility] transfer. May not have beds but ER has a call out to [facility]. If he stays here - I will follow along. I would recommend stepdown unit admission.  -- Amie Portland, MD Neurologist [facility] Pager: [phone]

## 2020-09-12 ENCOUNTER — Inpatient Hospital Stay (HOSPITAL_COMMUNITY)
Admission: AD | Admit: 2020-09-12 | Discharge: 2020-09-18 | DRG: 065 | Disposition: A | Discharge status: Rehabilitation Facility | Payer: BC Managed Care – PPO | Source: Other Acute Inpatient Hospital | Attending: Internal Medicine | Admitting: Internal Medicine

## 2020-09-12 ENCOUNTER — Inpatient Hospital Stay
Admit: 2020-09-12 | Discharge: 2020-09-12 | Disposition: A | Discharge status: Home / Self Care | Payer: BC Managed Care – PPO | Attending: Student in an Organized Health Care Education/Training Program | Admitting: Student in an Organized Health Care Education/Training Program

## 2020-09-12 ENCOUNTER — Encounter (HOSPITAL_COMMUNITY): Payer: Self-pay | Admitting: Internal Medicine

## 2020-09-12 ENCOUNTER — Encounter: Payer: Self-pay | Admitting: Internal Medicine

## 2020-09-12 ENCOUNTER — Inpatient Hospital Stay: Payer: BC Managed Care – PPO

## 2020-09-12 DIAGNOSIS — I10 Essential (primary) hypertension: Secondary | ICD-10-CM | POA: Diagnosis present

## 2020-09-12 DIAGNOSIS — E785 Hyperlipidemia, unspecified: Secondary | ICD-10-CM | POA: Diagnosis present

## 2020-09-12 DIAGNOSIS — G464 Cerebellar stroke syndrome: Secondary | ICD-10-CM | POA: Diagnosis not present

## 2020-09-12 DIAGNOSIS — Z20822 Contact with and (suspected) exposure to covid-19: Secondary | ICD-10-CM | POA: Diagnosis present

## 2020-09-12 DIAGNOSIS — R7401 Elevation of levels of liver transaminase levels: Secondary | ICD-10-CM | POA: Diagnosis not present

## 2020-09-12 DIAGNOSIS — E1159 Type 2 diabetes mellitus with other circulatory complications: Secondary | ICD-10-CM | POA: Insufficient documentation

## 2020-09-12 DIAGNOSIS — I69322 Dysarthria following cerebral infarction: Secondary | ICD-10-CM | POA: Diagnosis not present

## 2020-09-12 DIAGNOSIS — Z8673 Personal history of transient ischemic attack (TIA), and cerebral infarction without residual deficits: Secondary | ICD-10-CM | POA: Diagnosis present

## 2020-09-12 DIAGNOSIS — E119 Type 2 diabetes mellitus without complications: Secondary | ICD-10-CM | POA: Diagnosis not present

## 2020-09-12 DIAGNOSIS — G8191 Hemiplegia, unspecified affecting right dominant side: Secondary | ICD-10-CM | POA: Diagnosis present

## 2020-09-12 DIAGNOSIS — R7301 Impaired fasting glucose: Secondary | ICD-10-CM | POA: Diagnosis not present

## 2020-09-12 DIAGNOSIS — I69393 Ataxia following cerebral infarction: Secondary | ICD-10-CM | POA: Diagnosis not present

## 2020-09-12 DIAGNOSIS — I63441 Cerebral infarction due to embolism of right cerebellar artery: Secondary | ICD-10-CM | POA: Diagnosis not present

## 2020-09-12 DIAGNOSIS — R29703 NIHSS score 3: Secondary | ICD-10-CM | POA: Diagnosis not present

## 2020-09-12 DIAGNOSIS — R5381 Other malaise: Secondary | ICD-10-CM | POA: Diagnosis not present

## 2020-09-12 DIAGNOSIS — Z23 Encounter for immunization: Secondary | ICD-10-CM | POA: Diagnosis not present

## 2020-09-12 DIAGNOSIS — I69318 Other symptoms and signs involving cognitive functions following cerebral infarction: Secondary | ICD-10-CM | POA: Diagnosis not present

## 2020-09-12 DIAGNOSIS — I7411 Embolism and thrombosis of thoracic aorta: Secondary | ICD-10-CM | POA: Diagnosis not present

## 2020-09-12 DIAGNOSIS — I6359 Cerebral infarction due to unspecified occlusion or stenosis of other cerebral artery: Secondary | ICD-10-CM | POA: Diagnosis not present

## 2020-09-12 DIAGNOSIS — E559 Vitamin D deficiency, unspecified: Secondary | ICD-10-CM | POA: Diagnosis not present

## 2020-09-12 DIAGNOSIS — I639 Cerebral infarction, unspecified: Secondary | ICD-10-CM | POA: Insufficient documentation

## 2020-09-12 DIAGNOSIS — I7410 Embolism and thrombosis of unspecified parts of aorta: Secondary | ICD-10-CM | POA: Insufficient documentation

## 2020-09-12 DIAGNOSIS — E871 Hypo-osmolality and hyponatremia: Secondary | ICD-10-CM | POA: Diagnosis not present

## 2020-09-12 DIAGNOSIS — R590 Localized enlarged lymph nodes: Secondary | ICD-10-CM | POA: Diagnosis not present

## 2020-09-12 DIAGNOSIS — E440 Moderate protein-calorie malnutrition: Secondary | ICD-10-CM | POA: Diagnosis not present

## 2020-09-12 DIAGNOSIS — R599 Enlarged lymph nodes, unspecified: Secondary | ICD-10-CM | POA: Diagnosis not present

## 2020-09-12 DIAGNOSIS — Z841 Family history of disorders of kidney and ureter: Secondary | ICD-10-CM | POA: Diagnosis not present

## 2020-09-12 DIAGNOSIS — I69351 Hemiplegia and hemiparesis following cerebral infarction affecting right dominant side: Secondary | ICD-10-CM | POA: Diagnosis not present

## 2020-09-12 DIAGNOSIS — H5509 Other forms of nystagmus: Secondary | ICD-10-CM | POA: Diagnosis not present

## 2020-09-12 LAB — ECHOCARDIOGRAM COMPLETE
AR max vel: 2.37 cm2
AV Area VTI: 2.34 cm2
AV Area mean vel: 2.23 cm2
AV Mean grad: 5 mmHg
AV Peak grad: 9.6 mmHg
Ao pk vel: 1.55 m/s
Area-P 1/2: 7.99 cm2
Height: 75 in
MV VTI: 3.71 cm2
S' Lateral: 2.3 cm
Weight: 2608 oz

## 2020-09-12 LAB — LIPID PANEL
Cholesterol: 251 mg/dL — ABNORMAL HIGH (ref 0–200)
HDL: 60 mg/dL (ref >40)
LDL Cholesterol: 169 mg/dL — ABNORMAL HIGH (ref 0–99)
Total CHOL/HDL Ratio: 4.2 RATIO
Triglycerides: 111 mg/dL (ref <150)
VLDL: 22 mg/dL (ref 0–40)

## 2020-09-12 LAB — CBC WITH DIFFERENTIAL/PLATELET
Abs Immature Granulocytes: 0.03 10*3/uL (ref 0.00–0.07)
Basophils Absolute: 0 10*3/uL (ref 0.0–0.1)
Basophils Relative: 1 %
Eosinophils Absolute: 0 10*3/uL (ref 0.0–0.5)
Eosinophils Relative: 0 %
HCT: 48.2 % (ref 39.0–52.0)
Hemoglobin: 16.3 g/dL (ref 13.0–17.0)
Immature Granulocytes: 0 %
Lymphocytes Relative: 11 %
Lymphs Abs: 0.9 10*3/uL (ref 0.7–4.0)
MCH: 31.7 pg (ref 26.0–34.0)
MCHC: 33.8 g/dL (ref 30.0–36.0)
MCV: 93.6 fL (ref 80.0–100.0)
Monocytes Absolute: 0.9 10*3/uL (ref 0.1–1.0)
Monocytes Relative: 11 %
Neutro Abs: 6.5 10*3/uL (ref 1.7–7.7)
Neutrophils Relative %: 77 %
Platelets: 373 10*3/uL (ref 150–400)
RBC: 5.15 MIL/uL (ref 4.22–5.81)
RDW: 13.2 % (ref 11.5–15.5)
WBC: 8.4 10*3/uL (ref 4.0–10.5)
nRBC: 0 % (ref 0.0–0.2)

## 2020-09-12 LAB — URINE DRUG SCREEN, QUALITATIVE (ARMC ONLY)
Amphetamines, Ur Screen: NOT DETECTED
Barbiturates, Ur Screen: NOT DETECTED
Benzodiazepine, Ur Scrn: NOT DETECTED
Cannabinoid 50 Ng, Ur ~~LOC~~: POSITIVE — AB
Cocaine Metabolite,Ur ~~LOC~~: NOT DETECTED
MDMA (Ecstasy)Ur Screen: NOT DETECTED
Methadone Scn, Ur: NOT DETECTED
Opiate, Ur Screen: NOT DETECTED
Phencyclidine (PCP) Ur S: NOT DETECTED
Tricyclic, Ur Screen: NOT DETECTED

## 2020-09-12 LAB — HEMOGLOBIN A1C
Hgb A1c MFr Bld: 6.5 % — ABNORMAL HIGH (ref 4.8–5.6)
Mean Plasma Glucose: 139.85 mg/dL

## 2020-09-12 LAB — CBG MONITORING, ED: Glucose-Capillary: 115 mg/dL — ABNORMAL HIGH (ref 70–99)

## 2020-09-12 LAB — BASIC METABOLIC PANEL
Anion gap: 12 (ref 5–15)
BUN: 14 mg/dL (ref 6–20)
CO2: 24 mmol/L (ref 22–32)
Calcium: 10 mg/dL (ref 8.9–10.3)
Chloride: 100 mmol/L (ref 98–111)
Creatinine, Ser: 0.86 mg/dL (ref 0.61–1.24)
GFR, Estimated: >60 mL/min (ref >60)
Glucose, Bld: 139 mg/dL — ABNORMAL HIGH (ref 70–99)
Potassium: 3.8 mmol/L (ref 3.5–5.1)
Sodium: 136 mmol/L (ref 135–145)

## 2020-09-12 LAB — HEPARIN LEVEL (UNFRACTIONATED)
Heparin Unfractionated: <0.1 IU/mL — ABNORMAL LOW (ref 0.30–0.70)
Heparin Unfractionated: 0.27 IU/mL — ABNORMAL LOW (ref 0.30–0.70)
Heparin Unfractionated: 0.19 IU/mL — ABNORMAL LOW (ref 0.30–0.70)

## 2020-09-12 MED ORDER — ACETAMINOPHEN 160 MG/5ML PO SOLN: 650.0000 mg | ORAL | Status: DC | PRN

## 2020-09-12 MED ORDER — ATORVASTATIN CALCIUM 20 MG PO TABS: 80.0000 mg | ORAL_TABLET | Freq: Every evening | ORAL | Status: DC

## 2020-09-12 MED ORDER — HEPARIN BOLUS VIA INFUSION
1100.0000 [IU] | Freq: Once | INTRAVENOUS | Status: AC
  Administered 2020-09-12: 1100 [IU] via INTRAVENOUS
  Filled 2020-09-12: qty 1100

## 2020-09-12 MED ORDER — STROKE: EARLY STAGES OF RECOVERY BOOK
Freq: Once | Status: DC
  Filled 2020-09-12: qty 1

## 2020-09-12 MED ORDER — ATORVASTATIN CALCIUM 40 MG PO TABS
40.0000 mg | ORAL_TABLET | Freq: Every day | ORAL | Status: DC
  Administered 2020-09-12 – 2020-09-18 (×7): 40 mg via ORAL
  Filled 2020-09-12 (×7): qty 1

## 2020-09-12 MED ORDER — STROKE: EARLY STAGES OF RECOVERY BOOK
Freq: Once | Status: AC
  Filled 2020-09-12: qty 1

## 2020-09-12 MED ORDER — ASPIRIN EC 81 MG PO TBEC
81.0000 mg | DELAYED_RELEASE_TABLET | Freq: Every day | ORAL | Status: DC
  Administered 2020-09-12: 81 mg via ORAL
  Filled 2020-09-12: qty 1

## 2020-09-12 MED ORDER — LORAZEPAM 1 MG PO TABS: 1.0000 mg | ORAL_TABLET | ORAL | Status: DC | PRN

## 2020-09-12 MED ORDER — SODIUM CHLORIDE 0.9 % IV SOLN: INTRAVENOUS | Status: DC

## 2020-09-12 MED ORDER — THIAMINE HCL 100 MG/ML IJ SOLN
100.0000 mg | Freq: Every day | INTRAMUSCULAR | Status: DC
  Administered 2020-09-12: 100 mg via INTRAVENOUS
  Filled 2020-09-12: qty 2

## 2020-09-12 MED ORDER — ACETAMINOPHEN 325 MG PO TABS: 650.0000 mg | ORAL_TABLET | ORAL | Status: DC | PRN

## 2020-09-12 MED ORDER — HEPARIN BOLUS VIA INFUSION
2200.0000 [IU] | Freq: Once | INTRAVENOUS | Status: AC
  Administered 2020-09-12: 2200 [IU] via INTRAVENOUS
  Filled 2020-09-12: qty 2200

## 2020-09-12 MED ORDER — SENNOSIDES-DOCUSATE SODIUM 8.6-50 MG PO TABS: 1.0000 | ORAL_TABLET | Freq: Every evening | ORAL | Status: DC | PRN

## 2020-09-12 MED ORDER — LORAZEPAM 2 MG/ML IJ SOLN: 1.0000 mg | INTRAMUSCULAR | Status: DC | PRN

## 2020-09-12 MED ORDER — ADULT MULTIVITAMIN W/MINERALS CH
1.0000 | ORAL_TABLET | Freq: Every day | ORAL | Status: DC
  Administered 2020-09-12: 1 via ORAL
  Filled 2020-09-12: qty 1

## 2020-09-12 MED ORDER — HEPARIN (PORCINE) 25000 UT/250ML-% IV SOLN
1300.0000 [IU]/h | INTRAVENOUS | Status: DC
  Administered 2020-09-12: 1350 [IU]/h via INTRAVENOUS
  Administered 2020-09-14 – 2020-09-16 (×4): 1300 [IU]/h via INTRAVENOUS
  Filled 2020-09-12 (×5): qty 250

## 2020-09-12 MED ORDER — FOLIC ACID 1 MG PO TABS
1.0000 mg | ORAL_TABLET | Freq: Every day | ORAL | Status: DC
  Administered 2020-09-12: 1 mg via ORAL
  Filled 2020-09-12: qty 1

## 2020-09-12 MED ORDER — ACETAMINOPHEN 650 MG RE SUPP: 650.0000 mg | RECTAL | Status: DC | PRN

## 2020-09-12 NOTE — Progress Notes (Addendum)
SLP Cancellation Note  Patient Details Name: Malik Jones MRN: [id number] DOB: 05-May-1960   Cancelled treatment:       Reason Eval/Treat Not Completed: Medical issues which prohibited therapy;Patient not medically ready (chart reviewed; consulted NSG re: pt's status). Pt is admitted for acute CVA and is known to have an aortic thrombus on CTA.  Neurology and vascular surgery consulted here.  Per vascular surgery recommend transfer to [facility] where the CT surgery is available.  ST services will Hold on any assessment at this time and monitor pt's status via chart for any potential f/u while still in ED at [facility] as he is pending transfer to [facility].    Orinda Kenner, MS, CCC-SLP Speech Language Pathologist Rehab Services [phone] [facility] 09/12/2020, 12:28 PM

## 2020-09-12 NOTE — ED Notes (Signed)
Pt resting, no complaints at this time

## 2020-09-12 NOTE — ED Notes (Signed)
[name] [phone] please call him with any updates and MRI result.

## 2020-09-12 NOTE — H&P (Signed)
[facility] [place] NAME: Malik Jones    MR#:  [id number]  DATE OF BIRTH:  16-Jan-1961  DATE OF ADMISSION:  09/11/2020  PRIMARY CARE PHYSICIAN: Ellene Route   REQUESTING/REFERRING PHYSICIAN: Dr Vladimir Crofts  CHIEF COMPLAINT:   Chief Complaint  Patient presents with   Weakness    HISTORY OF PRESENT ILLNESS:  Malik Jones  is a 60 y.o. male no past medical history presents to the emergency room after feeling dizzy and a few falls.  He was able to crawl back into his house and lie down.  He was discoordinated on his right side.  Also complains of right-sided weakness.  Patient did have some numbness on his left side.  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  ER physician contacted vascular surgery here who recommended transfer to a tertiary care center.  Dr. Roosevelt Locks hospitalist at [facility] accepted the patient but currently no beds.  Neurology here saw the patient and ordered an MRI of the brain and echocardiogram.  Patient currently on heparin drip.  PAST MEDICAL HISTORY:   Past Medical History:  Diagnosis Date   Medical history non-contributory     PAST SURGICAL HISTORY:   Past Surgical History:  Procedure Laterality Date   NO PAST SURGERIES      SOCIAL HISTORY:   Social History   Tobacco Use   Smoking status: Never   Smokeless tobacco: Never  Substance Use Topics   Alcohol use: Yes    FAMILY HISTORY:   Family History  Problem Relation Age of Onset   Kidney disease Mother    Hip fracture Father     DRUG ALLERGIES:  No Known Allergies  REVIEW OF SYSTEMS:  CONSTITUTIONAL: No fever, fatigue.  Positive for sweating. EYES: No blurred or double vision.  EARS, NOSE, AND THROAT: No tinnitus or ear pain. No  sore throat RESPIRATORY: No cough, shortness of breath, wheezing or hemoptysis.  CARDIOVASCULAR: No chest pain, orthopnea, edema.  GASTROINTESTINAL: No nausea, vomiting, diarrhea or abdominal pain. No blood in bowel movements GENITOURINARY: No dysuria, hematuria.  ENDOCRINE: No polyuria, nocturia,  HEMATOLOGY: No anemia, easy bruising or bleeding SKIN: No rash or lesion. MUSCULOSKELETAL: Right-sided incoordination NEUROLOGIC: Right-sided incoordination and left-sided numbness PSYCHIATRY: No anxiety or depression.   MEDICATIONS AT HOME:   Prior to Admission medications   Not on File   Patient does not take any medications.  VITAL SIGNS:  Blood pressure (!) 188/106, pulse 87, temperature 98.1 F (36.7 C), temperature source Oral, resp. rate 16, height '6\' 3"'$  (1.905 m), weight 73.9 kg, SpO2 99 %. Most recent BP 146/91. PHYSICAL EXAMINATION:  GENERAL:  60 y.o.-year-old patient lying in the bed with no acute distress.  EYES: Pupils equal, round, reactive to light and accommodation. No scleral icterus. Extraocular muscles intact.  HEENT: Head atraumatic, normocephalic. Oropharynx and nasopharynx clear.  NECK:  Supple, no jugular venous distention. No thyroid enlargement, no tenderness.  LUNGS: Normal breath sounds bilaterally, no wheezing, rales,rhonchi or crepitation. No use of accessory muscles of respiration.  CARDIOVASCULAR: S1, S2 normal. No murmurs, rubs, or gallops.  ABDOMEN: Soft, nontender, nondistended. Bowel sounds present. No organomegaly or mass.  EXTREMITIES: No pedal edema, cyanosis, or clubbing.  NEUROLOGIC: Tongue deviation to the right, and coordination with finger-nose and  heel shin right leg.  Power 4+ out of 5 right side.  5 out of 5 left side. PSYCHIATRIC: The patient is alert and oriented x 3.  SKIN: No rash, lesion, or ulcer.   LABORATORY PANEL:   CBC Recent Labs  Lab 09/12/20 0930  WBC 8.4  HGB 16.3  HCT 48.2  PLT 373    ------------------------------------------------------------------------------------------------------------------  Chemistries  Recent Labs  Lab 09/11/20 0939  NA 133*  K 3.8  CL 96*  CO2 22  GLUCOSE 186*  BUN 12  CREATININE 0.90  CALCIUM 9.7   ------------------------------------------------------------------------------------------------------------------    RADIOLOGY:  DG Chest 2 View  Result Date: 09/11/2020 CLINICAL DATA:  Pneumonia, confusion EXAM: CHEST - 2 VIEW COMPARISON:  None. FINDINGS: Cardiomegaly. Subtle heterogeneous airspace opacity of the peripheral right upper lobe. The visualized skeletal structures are unremarkable. IMPRESSION: 1. Subtle heterogeneous airspace opacity of the peripheral right upper lobe, concerning for infection. 2. Cardiomegaly. Electronically Signed   By: Eddie Candle M.D.   On: 09/11/2020 15:01   CT HEAD WO CONTRAST  Result Date: 09/11/2020 CLINICAL DATA:  Confusion began yesterday. Difficulty with thoughts. Tired. Numbness in the left upper extremity. EXAM: CT HEAD WITHOUT CONTRAST TECHNIQUE: Contiguous axial images were obtained from the base of the skull through the vertex without intravenous contrast. COMPARISON:  None. FINDINGS: Brain: Remote left superior cerebellar infarct is noted with volume loss. An acute/subacute right superior cerebellar infarct is present loss of gray-white differentiation and effacement of sulci. No acute hemorrhage or other mass lesion is present. No other acute or focal cortical abnormality is present. The ventricles are of normal size. No significant extraaxial fluid collection is present. Vascular: Atherosclerotic changes are present the cavernous internal carotid arteries. No hyperdense vessel is present. Skull: Calvarium is intact. No focal lytic or blastic lesions are present. No significant extracranial soft tissue lesion is present. Sinuses/Orbits: The paranasal sinuses and mastoid air cells are clear. The  globes and orbits are within normal limits. IMPRESSION: 1. Acute/subacute nonhemorrhagic right superior cerebellar infarct. 2. Remote left superior cerebellar infarct. 3. Atherosclerosis. These results were called by telephone at the time of interpretation on 09/11/2020 at 11:06 am to provider [facility] , who verbally acknowledged these results. Electronically Signed   By: San Morelle M.D.   On: 09/11/2020 11:06   CT ANGIO HEAD NECK W WO CM W PERF  Result Date: 09/11/2020 CLINICAL DATA:  Stroke, follow-up EXAM: CT ANGIOGRAPHY HEAD AND NECK CT PERFUSION BRAIN TECHNIQUE: Multidetector CT imaging of the head and neck was performed using the standard protocol during bolus administration of intravenous contrast. Multiplanar CT image reconstructions and MIPs were obtained to evaluate the vascular anatomy. Carotid stenosis measurements (when applicable) are obtained utilizing NASCET criteria, using the distal internal carotid diameter as the denominator. Multiphase CT imaging of the brain was performed following IV bolus contrast injection. Subsequent parametric perfusion maps were calculated using RAPID software. CONTRAST:  175m OMNIPAQUE IOHEXOL 350 MG/ML SOLN COMPARISON:  None. FINDINGS: CTA NECK Aortic arch: There is focal free-floating or adherent thrombus along the ascending thoracic aorta. Minor calcified plaque along the arch. Great vessel origins are patent with poor visualization of the left common carotid origin due to streak artifact from adjacent venous contrast. Right carotid system: Post patent.  No stenosis. Left carotid system: Patent.  No stenosis. Vertebral arteries: Patent. Suboptimal evaluation due to adjacent venous contrast. Left vertebral is dominant. No stenosis identified. Skeleton: No significant abnormality. Other neck: Unremarkable. Upper chest: Enlarged 1.2 cm  right paratracheal node. Nonenlarged 9 mm prevascular node. Foci of nodular consolidation in the visualized lungs. Review  of the MIP images confirms the above findings CTA HEAD Suboptimal contrast bolus. Anterior circulation: Intracranial internal carotid arteries are patent with trace calcified plaque. Anterior cerebral arteries are patent. Right A1 ACA is dominant. Middle cerebral arteries are patent. Posterior circulation: Intracranial vertebral arteries are patent. Basilar artery is patent. Major cerebellar artery origins are not well evaluated due to contrast timing. Right superior cerebellar artery origin is patent with suspected subsequent occlusion. Posterior cerebral arteries are patent. Venous sinuses: Patent as allowed by contrast bolus timing. Review of the MIP images confirms the above findings CT Brain Perfusion Findings: CBF (<30%) Volume: 50m Perfusion (Tmax>6.0s) volume: 362mMismatch Volume: 51m251mnfarction Location:Area of territory at risk partially corresponds to right superior cerebellar infarction on noncontrast head CT. IMPRESSION: Suboptimal contrast bolus timing. Focal free floating or adherent thrombus along the ascending thoracic aorta. Major cerebellar artery origins are not well evaluated due to contrast timing. Probable patent right superior cerebellar artery origin with suspected subsequent occlusion. Perfusion imaging is likely providing incomplete information in the posterior fossa. There is 3 mL of penumbra identified partially corresponding to the area of right superior cerebellar infarction on the noncontrast head CT. Foci of nodular consolidation in the visualized lungs suspicious for pneumonia. Reactive mediastinal adenopathy. Initial results were discussed by telephone at the time of interpretation on 09/11/2020 at 1:10 pm to [facility] these results. Electronically Signed   By: PraMacy MisD.   On: 09/11/2020 13:23    EKG:   Normal sinus rhythm 63 bpm, LVH.  IMPRESSION AND PLAN:   1.  Acute right superior cerebellar infarct.  Chronic left superior  cerebellar infarct.  Thrombus and aorta.  Neurology has on heparin drip and may convert over to Eliquis.  Patient with right-sided incoordination.  Neurology and vascular surgery here recommended tertiary care center evaluation.  ER physician [facility]  Asked nurse to do a swallow evaluation.  Speech, PT and OT consultations.  Patient currently getting an echocardiogram.  Neurology ordered an MRI of the brain.  LDL 169 start atorvastatin 2.  Alcohol use.  Patient would not quantify how much he drinks.  I will give IV thiamine and put on alcohol withdrawal protocol just in case. 3.  Hypertension essential.  Allow permissive hypertension at this point.  As needed hydralazine for elevated blood pressure. 4.  Impaired fasting glucose we will add on a hemoglobin A1c to existing labs 5.  Hyperlipidemia unspecified start atorvastatin    All the records are reviewed and case discussed with ED provider. Management plans discussed with the patient, and he is in agreement.  Left message for patient's sister.  Patient meets inpatient criteria secondary to having acute stroke and thrombus in the aorta.  Will need close monitoring.  CODE STATUS: full code  TOTAL TIME TAKING CARE OF THIS PATIENT: 50 minutes.    RicLoletha GrayerD on 09/12/2020 at 10:41 AM  Between 7am to 6pm - Pager - [phone] 6pm call admission pager [phone]  [facility]  CC: Primary care physician; CliEllene Route

## 2020-09-12 NOTE — ED Notes (Addendum)
Pt resting but blood pressure is elevated will give hydralazine

## 2020-09-12 NOTE — ED Notes (Signed)
Patient is scheduled for MRI, MRI called regarding IV pump for scan. Pump was brought to ER but does not turn on and no electrical cord with pump.  RN called and they are going to send another pump.

## 2020-09-12 NOTE — ED Notes (Signed)
Called by pharmacy to give bolus of heparin and to adjust the drip

## 2020-09-12 NOTE — H&P (Signed)
[facility] History and Physical  Malik Jones [id number] DOB: 1960-03-24 DOA: 09/12/2020  Referring physician: ED  PCP: Ellene Route   Patient is coming from: Home  Chief Complaint: Dizziness and falls  HPI:   Malik Jones is a 60 y.o. male with no significant past medical history presented to the emergency room at [facility] with dizziness and  falls with some discoordination on the right side and numbness on the left.  He was then brought into the emergency department and was noted to have acute cerebellar infarct.  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  Vascular surgery was consulted who recommended transfer to higher center.  Neurology saw the patient in the hospital who ordered MRI of the brain and echocardiogram.  Patient was then started on heparin drip and transferred to [facility] for further evaluation and treatment.  During my interview, patient still complains about dizziness lightheadedness.  Denies any chest pain, shortness of breath, fever, chills or rigor.  Denies any cough, runny nose or sore throat.  Denies any syncope.  Denies any urinary urgency, frequency or dysuria.  Patient did have 1 episode of vomiting during the whole episode.  He denies any current nausea.  Denies any recent travel or sick contacts.  Patient denies any significant medical history.  He states that he had been to [facility] in the past and never been told about blood pressure or cholesterol issues in the past   Review of Systems:  All systems were reviewed and were negative unless otherwise mentioned in the HPI  Past Medical History:  Diagnosis Date   Medical history non-contributory    Past Surgical History:  Procedure Laterality Date   NO PAST SURGERIES      Social History:  reports that he has never smoked. He has never used smokeless tobacco. He reports current alcohol use.  He reports current drug use. Drug: Marijuana.  No Known Allergies  Family History  Problem Relation Age of Onset   Kidney disease Mother    Hip fracture Father      Prior to Admission medications   Not on File    Physical Exam: There were no vitals filed for this visit. Wt Readings from Last 3 Encounters:  09/11/20 73.9 kg   There is no height or weight on file to calculate BMI.  General:  Average built, not in obvious distress HENT: Normocephalic, pupils equally reacting to light and accommodation.  No scleral pallor or icterus noted. Oral mucosa is moist.  Chest:  Clear breath sounds.  Diminished breath sounds bilaterally. No crackles or wheezes.  CVS: S1 &S2 heard. No murmur.  Regular rate and rhythm. Abdomen: Soft, nontender, nondistended.  Bowel sounds are heard.  Liver is not palpable, no abdominal mass palpated Extremities: No cyanosis, clubbing or edema.  Peripheral pulses are palpable. Psych: Alert, awake and oriented, normal mood CNS: Muscle power on both sides relatively preserved.  Gait not tested. Skin: Warm and dry.  No rashes noted.  Labs on Admission:   CBC: Recent Labs  Lab 09/11/20 0939 09/12/20 0930  WBC 6.5 8.4  NEUTROABS  --  6.5  HGB 14.7 16.3  HCT 43.3 48.2  MCV 94.3 93.6  PLT 335 [id number]    Basic Metabolic Panel: Recent Labs  Lab 09/11/20 0939 09/12/20 0930  NA 133* 136  K 3.8 3.8  CL 96* 100  CO2 22 24  GLUCOSE 186* 139*  BUN 12 14  CREATININE 0.90 0.86  CALCIUM 9.7 10.0    Liver Function Tests: No results for input(s): AST, ALT, ALKPHOS, BILITOT, PROT, ALBUMIN in the last 168 hours. No results for input(s): LIPASE, AMYLASE in the last 168 hours. No results for input(s): AMMONIA in the last 168 hours.  Cardiac Enzymes: No results for input(s): CKTOTAL, CKMB, CKMBINDEX, TROPONINI in the last 168 hours.  BNP (last 3 results) No results for input(s): BNP in the last 8760 hours.  ProBNP (last 3 results) No results for input(s):  PROBNP in the last 8760 hours.  CBG: Recent Labs  Lab 09/12/20 0732  GLUCAP 115*    Lipase  No results found for: LIPASE   Urinalysis    Component Value Date/Time   COLORURINE STRAW (A) 09/11/2020 1521   APPEARANCEUR CLEAR (A) 09/11/2020 1521   LABSPEC 1.036 (H) 09/11/2020 1521   PHURINE 6.0 09/11/2020 1521   GLUCOSEU NEGATIVE 09/11/2020 1521   HGBUR SMALL (A) 09/11/2020 1521   BILIRUBINUR NEGATIVE 09/11/2020 1521   KETONESUR 5 (A) 09/11/2020 1521   PROTEINUR NEGATIVE 09/11/2020 1521   NITRITE NEGATIVE 09/11/2020 1521   LEUKOCYTESUR NEGATIVE 09/11/2020 1521     Drugs of Abuse     Component Value Date/Time   LABOPIA NONE DETECTED 09/12/2020 0930   COCAINSCRNUR NONE DETECTED 09/12/2020 0930   LABBENZ NONE DETECTED 09/12/2020 0930   AMPHETMU NONE DETECTED 09/12/2020 0930   THCU POSITIVE (A) 09/12/2020 0930   LABBARB NONE DETECTED 09/12/2020 0930      Radiological Exams on Admission: DG Chest 2 View  Result Date: 09/11/2020 CLINICAL DATA:  Pneumonia, confusion EXAM: CHEST - 2 VIEW COMPARISON:  None. FINDINGS: Cardiomegaly. Subtle heterogeneous airspace opacity of the peripheral right upper lobe. The visualized skeletal structures are unremarkable. IMPRESSION: 1. Subtle heterogeneous airspace opacity of the peripheral right upper lobe, concerning for infection. 2. Cardiomegaly. Electronically Signed   By: Eddie Candle M.D.   On: 09/11/2020 15:01   CT HEAD WO CONTRAST  Result Date: 09/11/2020 CLINICAL DATA:  Confusion began yesterday. Difficulty with thoughts. Tired. Numbness in the left upper extremity. EXAM: CT HEAD WITHOUT CONTRAST TECHNIQUE: Contiguous axial images were obtained from the base of the skull through the vertex without intravenous contrast. COMPARISON:  None. FINDINGS: Brain: Remote left superior cerebellar infarct is noted with volume loss. An acute/subacute right superior cerebellar infarct is present loss of gray-white differentiation and effacement of sulci.  No acute hemorrhage or other mass lesion is present. No other acute or focal cortical abnormality is present. The ventricles are of normal size. No significant extraaxial fluid collection is present. Vascular: Atherosclerotic changes are present the cavernous internal carotid arteries. No hyperdense vessel is present. Skull: Calvarium is intact. No focal lytic or blastic lesions are present. No significant extracranial soft tissue lesion is present. Sinuses/Orbits: The paranasal sinuses and mastoid air cells are clear. The globes and orbits are within normal limits. IMPRESSION: 1. Acute/subacute nonhemorrhagic right superior cerebellar infarct. 2. Remote left superior cerebellar infarct. 3. Atherosclerosis. These results were called by telephone at the time of interpretation on 09/11/2020 at 11:06 am to provider [facility] , who verbally acknowledged these results. Electronically Signed   By: San Morelle M.D.   On: 09/11/2020 11:06   ECHOCARDIOGRAM COMPLETE  Result Date: 09/12/2020    ECHOCARDIOGRAM REPORT   Patient Name:   Malik Jones Date of Exam: 09/12/2020 Medical Rec #:  [id number]      Height:       75.0  in Accession #:    [id number]     Weight:       163.0 lb Date of Birth:  04-Apr-1960      BSA:          2.012 m Patient Age:    28 years       BP:           188/106 mmHg Patient Gender: M              HR:           103 bpm. Exam Location:  [facility] Procedure: 2D Echo, Color Doppler and Cardiac Doppler Indications:     I63.9 Stroke  History:         Patient has no prior history of Echocardiogram examinations.  Sonographer:     Charmayne Sheer RDCS (AE) Referring Phys:  [id number] ASHISH ARORA Diagnosing Phys: Serafina Royals MD  Sonographer Comments: Technically difficult study due to poor echo windows. Image acquisition challenging due to uncooperative patient and Image acquisition challenging due to respiratory motion. IMPRESSIONS  1. Left ventricular ejection fraction, by estimation, is 70 to 75%. The left  ventricle has hyperdynamic function. The left ventricle has no regional wall motion abnormalities. Left ventricular diastolic parameters were normal.  2. Right ventricular systolic function is normal. The right ventricular size is normal.  3. The mitral valve is normal in structure. Trivial mitral valve regurgitation.  4. The aortic valve is normal in structure. Aortic valve regurgitation is not visualized. FINDINGS  Left Ventricle: Left ventricular ejection fraction, by estimation, is 70 to 75%. The left ventricle has hyperdynamic function. The left ventricle has no regional wall motion abnormalities. The left ventricular internal cavity size was small. There is no  left ventricular hypertrophy. Left ventricular diastolic parameters were normal. Right Ventricle: The right ventricular size is normal. No increase in right ventricular wall thickness. Right ventricular systolic function is normal. Left Atrium: Left atrial size was normal in size. Right Atrium: Right atrial size was normal in size. Pericardium: There is no evidence of pericardial effusion. Mitral Valve: The mitral valve is normal in structure. Trivial mitral valve regurgitation. MV peak gradient, 2.9 mmHg. The mean mitral valve gradient is 1.0 mmHg. Tricuspid Valve: The tricuspid valve is normal in structure. Tricuspid valve regurgitation is trivial. Aortic Valve: The aortic valve is normal in structure. Aortic valve regurgitation is not visualized. Aortic valve mean gradient measures 5.0 mmHg. Aortic valve peak gradient measures 9.6 mmHg. Aortic valve area, by VTI measures 2.34 cm. Pulmonic Valve: The pulmonic valve was normal in structure. Pulmonic valve regurgitation is not visualized. Aorta: The aortic root and ascending aorta are structurally normal, with no evidence of dilitation. IAS/Shunts: No atrial level shunt detected by color flow Doppler.  LEFT VENTRICLE PLAX 2D LVIDd:         3.70 cm  Diastology LVIDs:         2.30 cm  LV e' medial:     5.44 cm/s LV PW:         0.90 cm  LV E/e' medial:  9.2 LV IVS:        0.80 cm  LV e' lateral:   7.29 cm/s LVOT diam:     2.10 cm  LV E/e' lateral: 6.9 LV SV:         46 LV SV Index:   23 LVOT Area:     3.46 cm  LEFT ATRIUM  Index LA diam:        2.30 cm 1.14 cm/m LA Vol (A2C):   26.2 ml 13.02 ml/m LA Vol (A4C):   22.0 ml 10.94 ml/m LA Biplane Vol: 24.5 ml 12.18 ml/m  AORTIC VALVE                    PULMONIC VALVE AV Area (Vmax):    2.37 cm     PV Vmax:       1.15 m/s AV Area (Vmean):   2.23 cm     PV Vmean:      80.800 cm/s AV Area (VTI):     2.34 cm     PV VTI:        0.150 m AV Vmax:           155.00 cm/s  PV Peak grad:  5.3 mmHg AV Vmean:          105.000 cm/s PV Mean grad:  3.0 mmHg AV VTI:            0.197 m AV Peak Grad:      9.6 mmHg AV Mean Grad:      5.0 mmHg LVOT Vmax:         106.00 cm/s LVOT Vmean:        67.600 cm/s LVOT VTI:          0.133 m LVOT/AV VTI ratio: 0.68  AORTA Ao Root diam: 3.50 cm MITRAL VALVE MV Area (PHT): 7.99 cm    SHUNTS MV Area VTI:   3.71 cm    Systemic VTI:  0.13 m MV Peak grad:  2.9 mmHg    Systemic Diam: 2.10 cm MV Mean grad:  1.0 mmHg MV Vmax:       0.85 m/s MV Vmean:      57.9 cm/s MV Decel Time: 95 msec MV E velocity: 50.10 cm/s MV A velocity: 78.00 cm/s MV E/A ratio:  0.64 Serafina Royals MD Electronically signed by Serafina Royals MD Signature Date/Time: 09/12/2020/2:42:46 PM    Final    CT ANGIO HEAD NECK W WO CM W PERF  Result Date: 09/11/2020 CLINICAL DATA:  Stroke, follow-up EXAM: CT ANGIOGRAPHY HEAD AND NECK CT PERFUSION BRAIN TECHNIQUE: Multidetector CT imaging of the head and neck was performed using the standard protocol during bolus administration of intravenous contrast. Multiplanar CT image reconstructions and MIPs were obtained to evaluate the vascular anatomy. Carotid stenosis measurements (when applicable) are obtained utilizing NASCET criteria, using the distal internal carotid diameter as the denominator. Multiphase CT imaging of the brain  was performed following IV bolus contrast injection. Subsequent parametric perfusion maps were calculated using RAPID software. CONTRAST:  170m OMNIPAQUE IOHEXOL 350 MG/ML SOLN COMPARISON:  None. FINDINGS: CTA NECK Aortic arch: There is focal free-floating or adherent thrombus along the ascending thoracic aorta. Minor calcified plaque along the arch. Great vessel origins are patent with poor visualization of the left common carotid origin due to streak artifact from adjacent venous contrast. Right carotid system: Post patent.  No stenosis. Left carotid system: Patent.  No stenosis. Vertebral arteries: Patent. Suboptimal evaluation due to adjacent venous contrast. Left vertebral is dominant. No stenosis identified. Skeleton: No significant abnormality. Other neck: Unremarkable. Upper chest: Enlarged 1.2 cm right paratracheal node. Nonenlarged 9 mm prevascular node. Foci of nodular consolidation in the visualized lungs. Review of the MIP images confirms the above findings CTA HEAD Suboptimal contrast bolus. Anterior circulation: Intracranial internal carotid arteries are patent with trace calcified plaque.  Anterior cerebral arteries are patent. Right A1 ACA is dominant. Middle cerebral arteries are patent. Posterior circulation: Intracranial vertebral arteries are patent. Basilar artery is patent. Major cerebellar artery origins are not well evaluated due to contrast timing. Right superior cerebellar artery origin is patent with suspected subsequent occlusion. Posterior cerebral arteries are patent. Venous sinuses: Patent as allowed by contrast bolus timing. Review of the MIP images confirms the above findings CT Brain Perfusion Findings: CBF (<30%) Volume: 12m Perfusion (Tmax>6.0s) volume: 332mMismatch Volume: 78m40mnfarction Location:Area of territory at risk partially corresponds to right superior cerebellar infarction on noncontrast head CT. IMPRESSION: Suboptimal contrast bolus timing. Focal free floating or  adherent thrombus along the ascending thoracic aorta. Major cerebellar artery origins are not well evaluated due to contrast timing. Probable patent right superior cerebellar artery origin with suspected subsequent occlusion. Perfusion imaging is likely providing incomplete information in the posterior fossa. There is 3 mL of penumbra identified partially corresponding to the area of right superior cerebellar infarction on the noncontrast head CT. Foci of nodular consolidation in the visualized lungs suspicious for pneumonia. Reactive mediastinal adenopathy. Initial results were discussed by telephone at the time of interpretation on 09/11/2020 at 1:10 pm to provider [facility] verb[facility] these results. Electronically Signed   By: PraMacy MisD.   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  On heparin drip.  [facility].  Continue heparin drip.  Repeat panel done this morning showed total cholesterol of 241 and LDL cholesterol of 169..  Marland Kitcheneurochecks.  We will get PT OT speech therapy.  [facility]  Spoke with neurology for consultation.  We will follow neurology recommendations.  We will continue heparin drip for now.  We will add Lipitor, low-dose aspirin.  Falls, dizziness secondary to cerebellar stroke.  We will provide supportive care.  Fall precautions.  Hyperlipidemia.  As per the lipid profile.  Continue statins.  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  He is unaware.  Hemoglobin A1c of 6.5.  Will need lifestyle  modification.  Might benefit from metformin prior to discharge.  We will get nutrition evaluation.   DVT Prophylaxis: Heparin drip  Consultant: Neurology consulted   Code Status: Full code  Microbiology none  Antibiotics: None  Family Communication:  Patients' condition and plan of care including tests being ordered have been discussed with the patient  who indicate understanding and agree with the plan.   Status is: Inpatient  Remains inpatient appropriate because:IV treatments appropriate due to intensity of illness or inability to take PO and Inpatient level of care appropriate due to severity of illness  Dispo: The patient is from: Home              Anticipated d/c is to: Home              Patient currently is not medically stable to d/c.   Difficult to place patient No  Severity of Illness: The appropriate patient status for this patient is INPATIENT. Inpatient status is judged to be reasonable and necessary in order to provide the required  intensity of service to ensure the patient's safety. The patient's presenting symptoms, physical exam findings, and initial radiographic and laboratory data in the context of their chronic comorbidities is felt to place them at high risk for further clinical deterioration. Furthermore, it is not anticipated that the patient will be medically stable for discharge from the hospital within 2 midnights of admission.  I certify that at the point of admission it is my clinical judgment that the patient will require inpatient hospital care spanning beyond 2 midnights from the point of admission due to high intensity of service, high risk for further deterioration and high frequency of surveillance required.  Signed, Flora Lipps, MD [facility] 09/12/2020

## 2020-09-12 NOTE — Progress Notes (Signed)
*  PRELIMINARY RESULTS* Echocardiogram 2D Echocardiogram has been performed.  Malik Jones 09/12/2020, 10:57 AM

## 2020-09-12 NOTE — Consult Note (Addendum)
ANTICOAGULATION CONSULT NOTE - Initial Consult at [place] for  IV Heparin Indication: stroke and aortic thrombus  No Known Allergies  Patient Measurements: Total Body Weight: 73.9 kg Height: 75 inches Heparin Dosing Weight: 73.9 kg  Vital Signs: Temp: 98.6 F (37 C) (08/04 1555) Temp Source: Oral (08/04 1356) BP: 159/107 (08/04 1555) Pulse Rate: 114 (08/04 1555)  Labs: Recent Labs    09/11/20 0939 09/11/20 1324 09/11/20 1452 09/11/20 2238 09/12/20 0724 09/12/20 0930 09/12/20 1826  HGB 14.7  --   --   --   --  16.3  --   HCT 43.3  --   --   --   --  48.2  --   PLT 335  --   --   --   --  373  --   APTT  --   --  26  --   --   --   --   LABPROT 13.0  --   --   --   --   --   --   INR 1.0  --   --   --   --   --   --   HEPARINUNFRC  --   --   --  <0.10* 0.27*  --  0.19*  CREATININE 0.90  --   --   --   --  0.86  --   TROPONINIHS 30* 52*  --   --   --   --   --   Estimated Creatinine Clearance: 97.9 mL/min (by C-G formula based on SCr of 0.86 mg/dL).  Medical History: Past Medical History:  Diagnosis Date   Medical history non-contributory     Assessment: 60 yr old man presented to the [facility] ED on 09/11/20 with L-sided weakness; head CT showed R nonhemorrhagic cerebellar stroke (pt out of window for tPA) and  thrombus along ascending thoracic aorta (likely the etiology of embolic infarction). Pt was started on heparin infusion at [facility]. He was transferred to [facility] this afternoon for further evaluation. Pt was on no anticoagulants PTA to [facility].  Pt's last heparin level at [facility] was 0.27 units/ml at 0724 this AM; he rec'd heparin 1100 units IV bolus X 1 and heparin infusion was increased to 1350 units/hr prior to transfer to [facility]. Per [facility] RN, pt arrived with heparin infusing; no issues with IV or bleeding observed. H/H 16.3/48.2, plt 373 (CBC stable, WNL).  Goal of Therapy:  Heparin level: 0.3-0.5 units/ml (stroke  protocol, no boluses due to recent stroke) Monitor platelets by anticoagulation protocol: Yes   Plan:  Continue heparin infusion at 1350 units/hr Check heparin level 1830 PM (~6 hrs after heparin infusion was increased to 1350 units/hr) Monitor daily heparin level, CBC Monitor for bleeding  Gillermina Hu, PharmD, BCPS, [facility] Clinical Pharmacist 09/12/2020 5:41 PM  ADDENDUM Heparin level ~6.5 hrs after pt rec'd heparin 1100 units IV bolus X 1, followed by heparin rate increase to 1350 units/hr at [facility], was 0.19 units/ml, which is below the goal range for this pt, despite bolus and rate increase.  As noted above, pt had heparin level of 0.27 units/ml prior to the bolus and rate increase. [facility] RN said that pt arrived with heparin IV infusing on pump from [facility] and she was told that the heparin was infusing en route from [facility] to [facility]. No issues with IV or bleeding observed. Increase heparin infusion to 1450 units/hr, ck 6-hr heparin level.

## 2020-09-12 NOTE — ED Notes (Signed)
RN has called MRI again regarding the IV pump they are going to check again and are aware this patient needs a pump.

## 2020-09-12 NOTE — Progress Notes (Signed)
OT Cancellation Note  Patient Details Name: Malik Jones MRN: [id number] DOB: [birth year]   Cancelled Treatment:    Reason Eval/Treat Not Completed: Patient not medically ready. Order received and chart reviewed. Pt noted to be pending possible transfer to [facility] surgery. Will hold this date and follow up as pt medically appropriate once plan established if pt remains at [facility]. Thank you.   Dessie Coma, M.S. OTR/L  09/12/20, 12:17 PM  ascom [phone]

## 2020-09-12 NOTE — ED Notes (Signed)
Report given to [place], Therapist, sports. Cathryn, RN will assume care of patient at this time.

## 2020-09-12 NOTE — ED Notes (Signed)
Pt care taken no complaints at this time. 

## 2020-09-12 NOTE — ED Notes (Signed)
Theodis Aguas (son) requested to be notified if patient is transferred to different facility. Phone number: [phone]

## 2020-09-12 NOTE — ED Notes (Signed)
Updated sister, Wandra Scot, at bedside. Contact number: [phone]

## 2020-09-12 NOTE — ED Provider Notes (Signed)
I assumed care of this patient approximately 0 700.  Please see operative progress note for full details regarding patient's initial evaluation assessment.  In brief patient presents with initial work-up concerning for acute CVA and is known to have an aortic thrombus on CTA.  Neurology and vascular surgery consulted here.  Per vascular surgery recommend transfer to [facility] where the CT surgery is available.  Was contacted by neurology with concerns that patient was still emergency room this morning and had any repeat lab work follow-up done at this point.  Blood work placed.  Discussed again with vascular surgery Dr. Delana Meyer who strongly advises not admit this patient here as he believes this will delay patient's transfer to [facility].  However given patient's prolonged stay in the emergency room will consult hospitalist pending transfer to [facility].   Lucrezia Starch, MD 09/12/20 1105

## 2020-09-12 NOTE — ED Notes (Signed)
Urine collected and sent to lab.

## 2020-09-12 NOTE — Consult Note (Signed)
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 Louanne Belton  CC: Right-sided clumsiness   History is obtained from: Chart review, Patient  HPI: Malik Jones is a 60 y.o. male with no significant medical history other than marijuana use who presented to [facility] on 09/11/2020 for evaluation of gait disturbance and clumsiness on the right upper and lower extremities. He noted on 09/10/2020 around 20:00 that his gait was uncoordinated with right lower extremity clumsiness with initial reports of left upper and lower extremity numbness. A CT head was obtained revealing an acute/subacute right superior cerebellar infarction as evidenced by a formed hypodensity. Vessel imaging was obtained with evidence of an adherent or free floating thrombus in the ascending aorta. He was started on a heparin drip under stroke protocol and transferred to [facility] for ongoing evaluation and access to a tertiary care center in the event that his condition should worsen with his aortic thrombus requiring cardiothoracic surgery. He arrived to [facility] on 09/12/2020.  On arrival, Malik Jones continues to endorse incoordination with the right upper and lower extremities with right lower extremity tingling sensation but without further reports of sensory deficits on the left.   LKW:  09/10/2020 20:00 tpa given?: no, outside of time window Modified Rankin Scale: 0-Completely asymptomatic and back to baseline post- stroke  ROS: A complete ROS was performed and is negative except as noted in the HPI.  Past Medical History:  Diagnosis Date   Medical history non-contributory    Past Surgical History:  Procedure Laterality Date   NO PAST SURGERIES     Family History  Problem Relation Age of Onset   Kidney disease Mother    Hip fracture Father    Social History:   reports that he has  never smoked. He has never used smokeless tobacco. He reports current alcohol use. He reports current drug use. Drug: Marijuana.  Medications  Current Facility-Administered Medications:     stroke: mapping our early stages of recovery book, , Does not apply, Once, Pokhrel, Laxman, MD   0.9 %  sodium chloride infusion, , Intravenous, Continuous, Pokhrel, Laxman, MD   acetaminophen (TYLENOL) tablet 650 mg, 650 mg, Oral, Q4H PRN **OR** acetaminophen (TYLENOL) 160 MG/5ML solution 650 mg, 650 mg, Per Tube, Q4H PRN **OR** acetaminophen (TYLENOL) suppository 650 mg, 650 mg, Rectal, Q4H PRN, Pokhrel, Laxman, MD   senna-docusate (Senokot-S) tablet 1 tablet, 1 tablet, Oral, QHS PRN, Pokhrel, Laxman, MD  Exam: Current vital signs: There were no vitals taken for this visit. Vital signs in last 24 hours: Temp:  [98.1 F (36.7 C)-98.6 F (37 C)] 98.6 F (37 C) (08/04 1555) Pulse Rate:  [66-115] 114 (08/04 1555) Resp:  [11-23] 23 (08/04 1555) BP: (139-188)/(87-116) 159/107 (08/04 1555) SpO2:  [91 %-100 %] 97 % (08/04 1555)  GENERAL: Awake, alert, in no acute distress Psych: Calm and cooperative with examination Head: Normocephalic and atraumatic without obvious abnormality EENT: Arcus senilis, dry mucous membranes, normal conjunctivae LUNGS: Normal respiratory effort on room air, non-labored breathing CV: Tachycardia on telemetry, no pedal edema noted ABDOMEN: Soft, non-tender, non-distended Ext: warm, well perfused, without obvious deformity  NEURO:  Mental Status: Awake, alert, and oriented x 3 Speech/Language: speech is intact without dysarthria.   Naming, repetition, fluency, and comprehension intact without evidence of aphasia No neglect is noted Cranial Nerves:  II: PERRL 2 mm/brisk. Visual fields full.   III, IV, VI:  EOMI without ptosis or nystagmus V: Sensation is intact to light touch with decreased sensation reported to the left cheek.  VII: Face is symmetric resting and smiling.    VIII: Hearing intact to voice IX, X: Palate elevation is symmetric. Phonation normal.  XI: Normal sternocleidomastoid and trapezius muscle strength XII: Tongue protrudes midline without fasciculations.   Motor: 5/5 strength is all muscle groups without vertical drift on assessment. No noted strength asymmetry.  Tone is normal. Bulk is normal.  Sensation: Sensation intact and symmetric to light touch on bilateral upper extremities with reports of tingling to the right lower extremity to light touch compared to the left lower extremity.  Coordination: Ataxic movements present on the right upper and lower extremity with more significant loss of fluid movement on the lower extremity compared to the upper extremity.  DTRs: 2+ and symmetric biceps and patellae Gait: Deferred  NIHSS: 1a Level of Conscious.: 0 1b LOC Questions: 0 1c LOC Commands: 0 2 Best Gaze: 0 3 Visual: 0 4 Facial Palsy: 0 5a Motor Arm - left: 0 5b Motor Arm - Right: 0 6a Motor Leg - Left: 0 6b Motor Leg - Right: 0 7 Limb Ataxia: 2 8 Sensory: 1 9 Best Language: 0 10 Dysarthria: 0 11 Extinct. and Inatten.: 0 TOTAL: 3  Labs I have reviewed labs in epic and the results pertinent to this consultation are: CBC    Component Value Date/Time   WBC 8.4 09/12/2020 0930   RBC 5.15 09/12/2020 0930   HGB 16.3 09/12/2020 0930   HCT 48.2 09/12/2020 0930   PLT 373 09/12/2020 0930   MCV 93.6 09/12/2020 0930   MCH 31.7 09/12/2020 0930   MCHC 33.8 09/12/2020 0930   RDW 13.2 09/12/2020 0930   LYMPHSABS 0.9 09/12/2020 0930   MONOABS 0.9 09/12/2020 0930   EOSABS 0.0 09/12/2020 0930   BASOSABS 0.0 09/12/2020 0930   CMP     Component Value Date/Time   NA 136 09/12/2020 0930   K 3.8 09/12/2020 0930   CL 100 09/12/2020 0930   CO2 24 09/12/2020 0930   GLUCOSE 139 (H) 09/12/2020 0930   BUN 14 09/12/2020 0930   CREATININE 0.86 09/12/2020 0930   CALCIUM 10.0 09/12/2020 0930   GFRNONAA >60 09/12/2020 0930   Lipid Panel      Component Value Date/Time   CHOL 251 (H) 09/12/2020 0930   TRIG 111 09/12/2020 0930   HDL 60 09/12/2020 0930   CHOLHDL 4.2 09/12/2020 0930   VLDL 22 09/12/2020 0930   LDLCALC 169 (H) 09/12/2020 0930   Lab Results  Component Value Date   HGBA1C 6.5 (H) 09/12/2020   Drugs of Abuse     Component Value Date/Time   LABOPIA NONE DETECTED 09/12/2020 0930   COCAINSCRNUR NONE DETECTED 09/12/2020 0930   LABBENZ NONE DETECTED 09/12/2020 0930   AMPHETMU NONE DETECTED 09/12/2020 0930   THCU POSITIVE (A) 09/12/2020 0930   LABBARB NONE DETECTED 09/12/2020 0930    Imaging I have reviewed the images obtained:  CT-scan of the brain 09/11/2020: 1. Acute/subacute nonhemorrhagic right superior cerebellar infarct. 2. Remote left superior cerebellar infarct. 3. Atherosclerosis.  CT angio head and neck with CT cerebral perfusion 09/11/2020: Suboptimal contrast bolus timing. Focal free floating or adherent thrombus along the ascending thoracic aorta. Major cerebellar artery origins are not well evaluated due to contrast timing. Probable patent right superior cerebellar artery origin with suspected subsequent occlusion. Perfusion imaging is likely providing incomplete information in the posterior fossa. There is  3 mL of penumbra identified partially corresponding to the area of right superior cerebellar infarction on the noncontrast head CT. Foci of nodular consolidation in the visualized lungs suspicious for pneumonia. Reactive mediastinal adenopathy.  Assessment: 60 year old with no significant PMHx who presented to [facility] 09/11/2020 for sudden onset of gait disturbance, right-sided clumsiness, left-sided numbness noted to have mild right hemiparesis, right-sided ataxia and left-sided sensory deficits with a CT head concerning for acute to subacute right superior cerebellar infarction and CT angiography of the head with no emergent LVO but an ascending aortic thrombus. 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 - MRI brain pending- if there is no bleed, might consider converting IV anticoagulation to p.o. anticoagulation with Eliquis. - Hemoglobin A1c is at goal at 6.5% - LDL of 169, above goal of < 70, initiate statin therapy - Given that he is anticoagulated-blood pressure goal, although permissive hypertension would be ideal but I would do modified reduced permissive hypertension with a systolic goal of no more than 180.  Pt seen by NP/Neuro and later by MD. Note/plan to be edited by MD as needed.  Anibal Henderson, AGAC-NP [facility] Pager: [phone]   NEUROHOSPITALIST ADDENDUM Performed a face to face diagnostic evaluation.   I have reviewed the contents of history and physical exam as documented by PA/ARNP/Resident and agree with above documentation.  I have discussed and formulated the above plan as documented. Edits to the note have been made as needed.  Impression/key exam findings/Plan: 88M p/w R cerebellar stroke and aortic arch thrombus. Tolerating Heparin fine. Has ataxia in RUE and RLE. Eventual plan to transition to [place] if stable.  Donnetta Simpers, MD [facility] [phone]   If 7pm to 7am, please call on call as listed on AMION.

## 2020-09-12 NOTE — ED Notes (Signed)
Report given to Lou, RN. 

## 2020-09-12 NOTE — Progress Notes (Signed)
PT Cancellation Note  Patient Details Name: Malik Jones MRN: [id number] DOB: 1960/11/04   Cancelled Treatment:    Reason Eval/Treat Not Completed: Patient not medically ready. PT order received and pt chart reviewed. Pt noted to be pending possible transfer to [facility] for CT surgery. Will hold this date and follow up as pt medically appropriate once plan established if pt remains at [facility]. Thank you.     Herminio Commons, PT, DPT 12:29 PM,09/12/20

## 2020-09-12 NOTE — Consult Note (Signed)
ANTICOAGULATION CONSULT NOTE - Initial Consult  Pharmacy Consult for Heparin infusion Indication: stroke and aortic thrombus  No Known Allergies  Patient Measurements: Height: '6\' 3"'$  (190.5 cm) Weight: 73.9 kg (163 lb) IBW/kg (Calculated) : 84.5 Heparin Dosing Weight: 73.9 kg  Vital Signs: BP: 181/116 (08/03 2100) Pulse Rate: 84 (08/03 2100)  Labs: Recent Labs    09/11/20 0939 09/11/20 1324 09/11/20 1452 09/11/20 2238  HGB 14.7  --   --   --   HCT 43.3  --   --   --   PLT 335  --   --   --   APTT  --   --  26  --   LABPROT 13.0  --   --   --   INR 1.0  --   --   --   HEPARINUNFRC  --   --   --  <0.10*  CREATININE 0.90  --   --   --   TROPONINIHS 30* 52*  --   --      Estimated Creatinine Clearance: 93.5 mL/min (by C-G formula based on SCr of 0.9 mg/dL).   Medical History: History reviewed. No pertinent past medical history.  Medications:  (Not in a hospital admission) Scheduled:   heparin  2,200 Units Intravenous Once   Infusions:   heparin 900 Units/hr (09/11/20 1451)   PRN: hydrALAZINE Anti-infectives (From admission, onward)    None       Assessment: Pharmacy has been consulted to initiate heparin infusion in 60yo male who presented to the ED with left sided weakness. CT of head c/f acute/subacute cerebellar stroke. Patient not a candidate for tPA as he is outside of the window, per Neurologist. Plan is for patient to transfer to [facility] once able. Baseline labs: INR 1.0, aPTT 26sec, Plts 335, hgb 14.7.   Goal of Therapy:  Heparin level 0.3-0.7 units/ml Monitor platelets by anticoagulation protocol: Yes   Plan:   8/4:  HL @ 2238 = < 0.1 Will order heparin 2200 units IV X 1 bolus and increase drip rate to 1200 units/hr.  Will recheck HL 6 hrs after rate change.   AmeLie Hollars D 09/12/2020,12:42 AM

## 2020-09-12 NOTE — Progress Notes (Addendum)
Neurology Progress Note   S:// No acute issues overnight. Blood pressure systolic range 1 [id number].   O:// Current vital signs: BP (!) 163/101   Pulse 99   Temp 98.1 F (36.7 C) (Oral)   Resp 14   Ht '6\' 3"'$  (1.905 m)   Wt 73.9 kg   SpO2 100%   BMI 20.37 kg/m  Vital signs in last 24 hours: Temp:  [97.9 F (36.6 C)-98.1 F (36.7 C)] 98.1 F (36.7 C) (08/04 0700) Pulse Rate:  [61-99] 99 (08/04 0700) Resp:  [11-19] 14 (08/04 0700) BP: (139-188)/(88-116) 163/101 (08/04 0700) SpO2:  [91 %-100 %] 100 % (08/04 0700) Weight:  [73.9 kg] 73.9 kg (08/03 0943) General: Comfortably sleeping in bed in no distress HEENT: Normocephalic atraumatic Lungs: Clear next cardiovascular: Regular rate rhythm Abdomen nondistended nontender Extremities warm well perfused Neurological exam Awake alert oriented x3. Speech is mildly dysarthric No evidence of aphasia Cranial nerves II to XII intact Motor examination with mildly decreased strength in the right a upper and lower extremities with subtle drift and gross dysmetria.  Left side normal strength. Sensation diminished on the left in comparison to the right Coordination: Severe dysmetria on the right upper and lower extremity Gait testing deferred at this time Examination is unchanged from yesterday Medications  Current Facility-Administered Medications:     stroke: mapping our early stages of recovery book, , Does not apply, Once, Amie Portland, MD   heparin ADULT infusion 100 units/mL (25000 units/297m), 1,200 Units/hr, Intravenous, Continuous, ZWynetta FinesT, MD, Last Rate: 12 mL/hr at 09/12/20 0154, 1,200 Units/hr at 09/12/20 0154   hydrALAZINE (APRESOLINE) injection 5 mg, 5 mg, Intravenous, Q4H PRN, ZWynetta FinesT, MD, 5 mg at 09/12/20 0650 No current outpatient medications on file.  Labs CBC    Component Value Date/Time   WBC 6.5 09/11/2020 0939   RBC 4.59 09/11/2020 0939   HGB 14.7 09/11/2020 0939   HCT 43.3 09/11/2020 0939    PLT 335 09/11/2020 0939   MCV 94.3 09/11/2020 0939   MCH 32.0 09/11/2020 0939   MCHC 33.9 09/11/2020 0939   RDW 12.8 09/11/2020 0939    CMP     Component Value Date/Time   NA 133 (L) 09/11/2020 0939   K 3.8 09/11/2020 0939   CL 96 (L) 09/11/2020 0939   CO2 22 09/11/2020 0939   GLUCOSE 186 (H) 09/11/2020 0939   BUN 12 09/11/2020 0939   CREATININE 0.90 09/11/2020 0939   CALCIUM 9.7 09/11/2020 0939   GFRNONAA >60 09/11/2020 [id number]   Imaging I have reviewed images in epic and the results pertinent to this consultation are: No new imaging to review CTA head and neck from yesterday with an aortic thrombus CT head with right cerebellar stroke MRI ordered-pending   Assessment: 60year old with no significant past medical history presenting for sudden onset of gait disturbance, right-sided clumsiness, left-sided numbness noted to have mild right hemiparesis, right-sided ataxia and left-sided sensory deficits with a CT head concerning for acute to subacute right superior cerebellar infarction and CT angiography of the head with no emergent LVO but an ascending aortic thrombus. Started on heparin yesterday. Vascular surgery consultation placed-recommend transfer to tertiary care center which he is still awaiting. [facility].  Impression: Right cerebellar infarction Ascending aortic thrombus-likely the etiology of the embolic infarction   Recommendations: He has tolerated IV heparin. I would repeat a  head CT or an MRI and if there is no bleed, might consider converting IV anticoagulation to p.o. anticoagulation with Eliquis. I have ordered an MRI of the brain Have also ordered the A1c, lipid panel and place the stroke order set as he is awaiting a bed at [facility]. I have reached out to the emergency room doctors to follow-up on his daily labs. His urinary  toxicology screen is also pending. Given that he is anticoagulated-blood pressure goal, although permissive hypertension would be ideal but I would do modified reduced permissive hypertension with a systolic goal of no more than 180.  I discussed my plan with the medical director on-call for the hospitalist service as well as the ED provider Dr. Tamala Julian.  Plan remains to transfer him to [facility] soon as a bed becomes available.  My team at [facility] is aware  -- Amie Portland, MD Neurologist [facility] Pager: [phone]

## 2020-09-12 NOTE — ED Notes (Signed)
Called pharmacy and requested they send stroke recovery book.

## 2020-09-12 NOTE — ED Notes (Signed)
Venipuncture completed for Heparin level and CBG Patient tolerated well.

## 2020-09-12 NOTE — Consult Note (Signed)
ANTICOAGULATION CONSULT NOTE - Initial Consult  Pharmacy Consult for Heparin infusion Indication: stroke and aortic thrombus  No Known Allergies  Patient Measurements: Height: '6\' 3"'$  (190.5 cm) Weight: 73.9 kg (163 lb) IBW/kg (Calculated) : 84.5 Heparin Dosing Weight: 73.9 kg  Vital Signs: Temp: 98.4 F (36.9 C) (08/04 1100) Temp Source: Oral (08/04 1100) BP: 165/87 (08/04 1100) Pulse Rate: 89 (08/04 1100)  Labs: Recent Labs    09/11/20 0939 09/11/20 1324 09/11/20 1452 09/11/20 2238 09/12/20 0724 09/12/20 0930  HGB 14.7  --   --   --   --  16.3  HCT 43.3  --   --   --   --  48.2  PLT 335  --   --   --   --  373  APTT  --   --  26  --   --   --   LABPROT 13.0  --   --   --   --   --   INR 1.0  --   --   --   --   --   HEPARINUNFRC  --   --   --  <0.10* 0.27*  --   CREATININE 0.90  --   --   --   --   --   TROPONINIHS 30* 52*  --   --   --   --     Estimated Creatinine Clearance: 93.5 mL/min (by C-G formula based on SCr of 0.9 mg/dL).   Medical History: Past Medical History:  Diagnosis Date   Medical history non-contributory     Medications:  (Not in a hospital admission) Scheduled:    stroke: mapping our early stages of recovery book   Does not apply Once   atorvastatin  80 mg Oral QPM   folic acid  1 mg Oral Daily   multivitamin with minerals  1 tablet Oral Daily   thiamine injection  100 mg Intravenous Daily   Infusions:   heparin 1,200 Units/hr (09/12/20 0154)   PRN: hydrALAZINE, LORazepam **OR** LORazepam  Assessment: Pharmacy has been consulted to initiate heparin infusion in 60yo male who presented to the ED with left sided weakness. CT of head c/f acute/subacute cerebellar stroke. Patient not a candidate for tPA as he is outside of the window, per Neurologist. Plan is for patient to transfer to [facility] once able. Baseline labs: INR 1.0, aPTT 26sec, Plts 335, hgb 14.7. repeat CBC shows stable H/H and PLT.  Goal of Therapy:  Heparin level  0.3-0.7 units/ml Monitor platelets by anticoagulation protocol: Yes   Plan:   8/4:  HL @ 0724 = 0.27 Will order heparin 1100 units IV X 1 bolus and increase drip rate to 1350 units/hr.  Will recheck HL 6 hrs after rate change.   Jadah Bobak Rodriguez-Guzman PharmD, BCPS 09/12/2020 11:28 AM

## 2020-09-12 NOTE — ED Notes (Signed)
Patient to [facility] via [place]. Patient stable Heparin drip infusing. Son at bedside at time of transport all information given to son.

## 2020-09-12 NOTE — ED Notes (Signed)
Verified Heparin infusing at 1200units/hr

## 2020-09-13 ENCOUNTER — Inpatient Hospital Stay (HOSPITAL_COMMUNITY): Payer: BC Managed Care – PPO

## 2020-09-13 DIAGNOSIS — E440 Moderate protein-calorie malnutrition: Secondary | ICD-10-CM | POA: Insufficient documentation

## 2020-09-13 LAB — BASIC METABOLIC PANEL
Anion gap: 10 (ref 5–15)
BUN: 14 mg/dL (ref 6–20)
CO2: 23 mmol/L (ref 22–32)
Calcium: 9.5 mg/dL (ref 8.9–10.3)
Chloride: 101 mmol/L (ref 98–111)
Creatinine, Ser: 0.99 mg/dL (ref 0.61–1.24)
GFR, Estimated: >60 mL/min (ref >60)
Glucose, Bld: 118 mg/dL — ABNORMAL HIGH (ref 70–99)
Potassium: 3.6 mmol/L (ref 3.5–5.1)
Sodium: 134 mmol/L — ABNORMAL LOW (ref 135–145)

## 2020-09-13 LAB — CBC
HCT: 46.2 % (ref 39.0–52.0)
Hemoglobin: 15.3 g/dL (ref 13.0–17.0)
MCH: 31.3 pg (ref 26.0–34.0)
MCHC: 33.1 g/dL (ref 30.0–36.0)
MCV: 94.5 fL (ref 80.0–100.0)
Platelets: 398 10*3/uL (ref 150–400)
RBC: 4.89 MIL/uL (ref 4.22–5.81)
RDW: 13.4 % (ref 11.5–15.5)
WBC: 5.9 10*3/uL (ref 4.0–10.5)
nRBC: 0 % (ref 0.0–0.2)

## 2020-09-13 LAB — HIV ANTIBODY (ROUTINE TESTING W REFLEX): HIV Screen 4th Generation wRfx: NONREACTIVE

## 2020-09-13 LAB — MAGNESIUM: Magnesium: 2.4 mg/dL (ref 1.7–2.4)

## 2020-09-13 LAB — HEPARIN LEVEL (UNFRACTIONATED)
Heparin Unfractionated: 0.68 IU/mL (ref 0.30–0.70)
Heparin Unfractionated: 0.48 IU/mL (ref 0.30–0.70)
Heparin Unfractionated: 0.33 IU/mL (ref 0.30–0.70)

## 2020-09-13 IMAGING — MR MR HEAD W/O CM
4 of 10 series · 21 of 48 positions shown · non-contrast
Comparison: Head CT [DATE].

CLINICAL DATA: Stroke suspected.

EXAM:
MRI HEAD WITHOUT CONTRAST
TECHNIQUE: Multiplanar, multiecho pulse sequences of the brain and surrounding
structures were obtained without intravenous contrast.

[Series 2: DWI · axial · 3.0mm · 0.94mm/px · z∈[-81,+61]mm · 9 of 96 slices shown (1 of 2)]
[im 1/96]
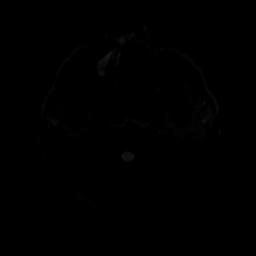
[im 12/96]
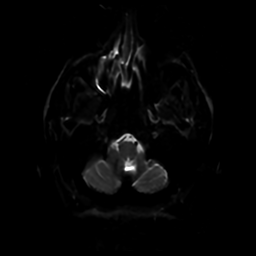
[im 24/96]
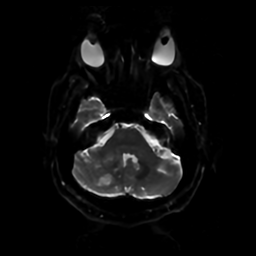
[im 36/96]
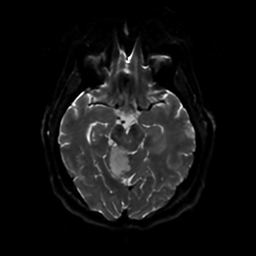
[im 48/96]
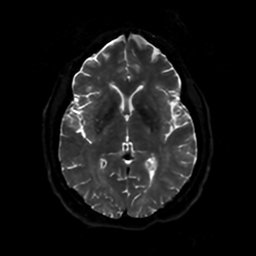
[im 60/96]
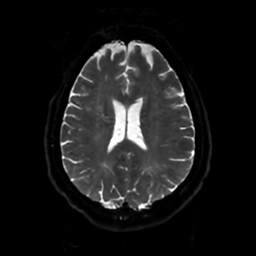
[im 72/96]
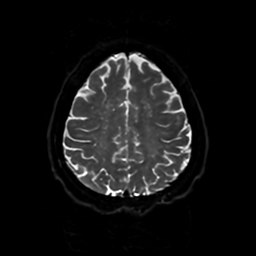
[im 84/96]
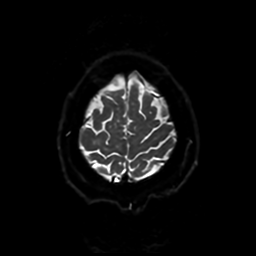
[im 96/96]
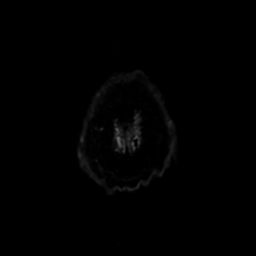

[Series 3: DWI · coronal · 4.0mm · 0.94mm/px · 5 of 71 slices shown (2 of 2)]
[im 1/71]
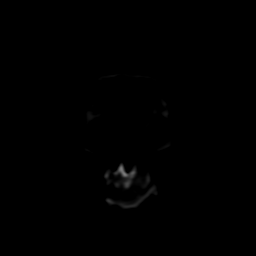
[im 11/71]
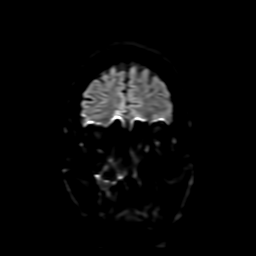
[im 21/71]
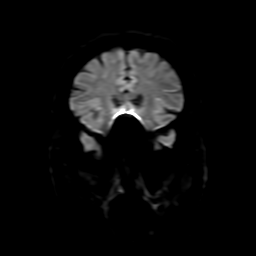
[im 41/71]
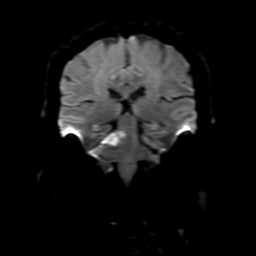
[im 61/71]
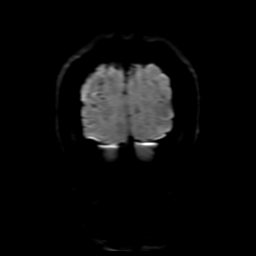

[Series 4: FLAIR · axial · 4.0mm · 0.45mm/px · z∈[-83,+64]mm · 4 of 35 slices shown (1 of 2)]
[im 1/35]
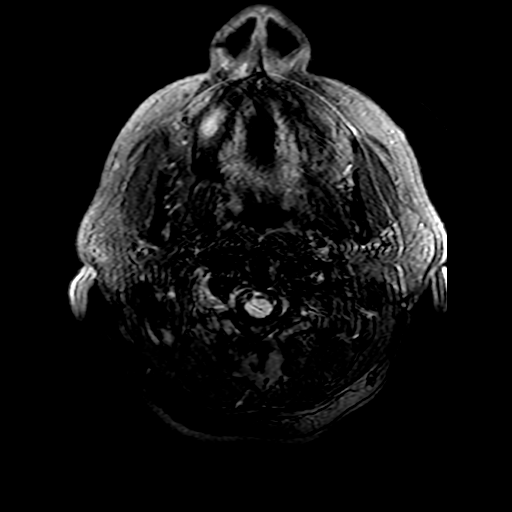
[im 12/35]
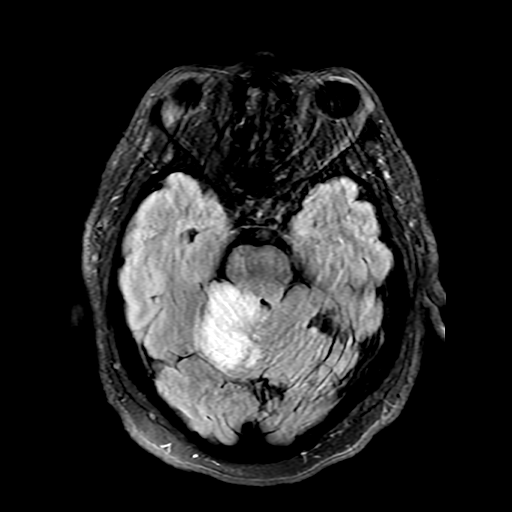
[im 23/35]
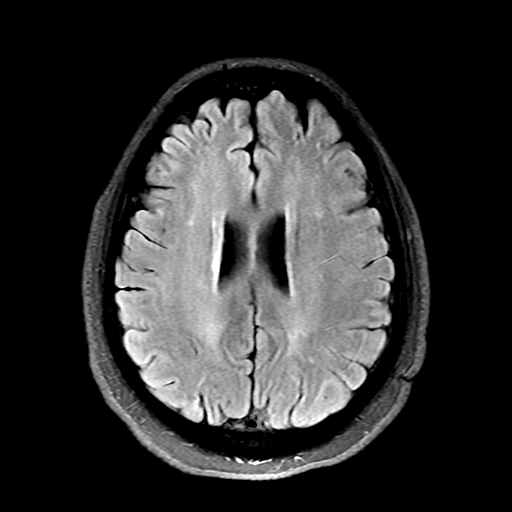
[im 35/35]
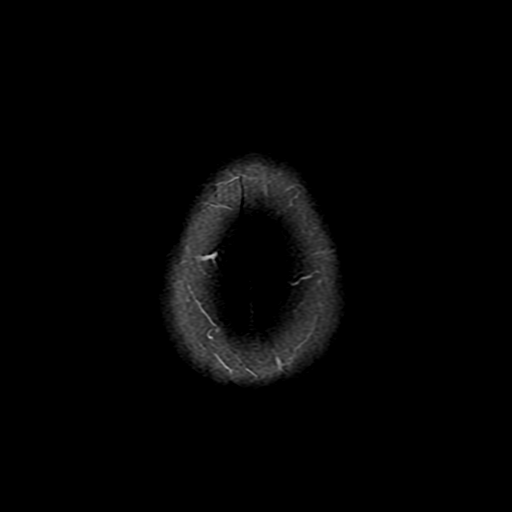

[Series 6: FLAIR · sagittal · 5.0mm · 0.23mm/px · 3 of 23 slices shown (2 of 2)]
[im 1/23]
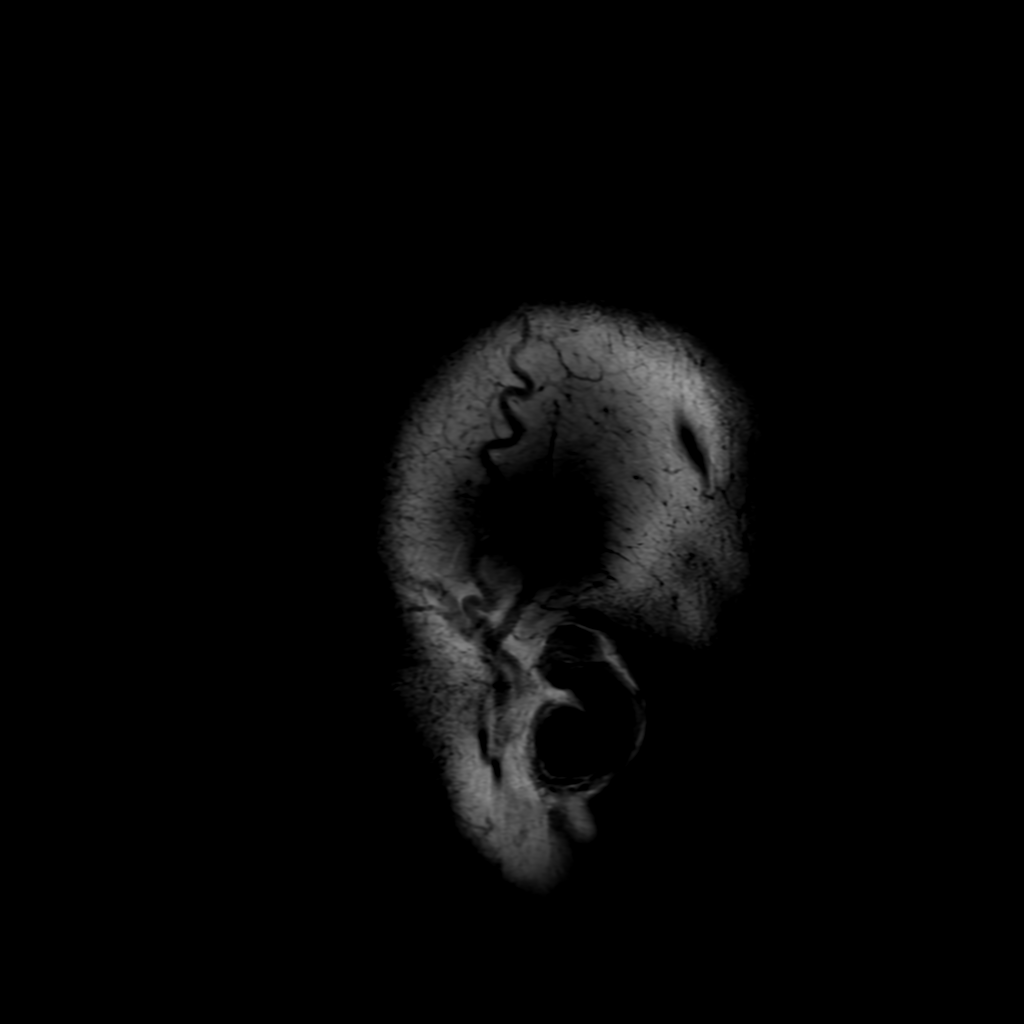
[im 12/23]
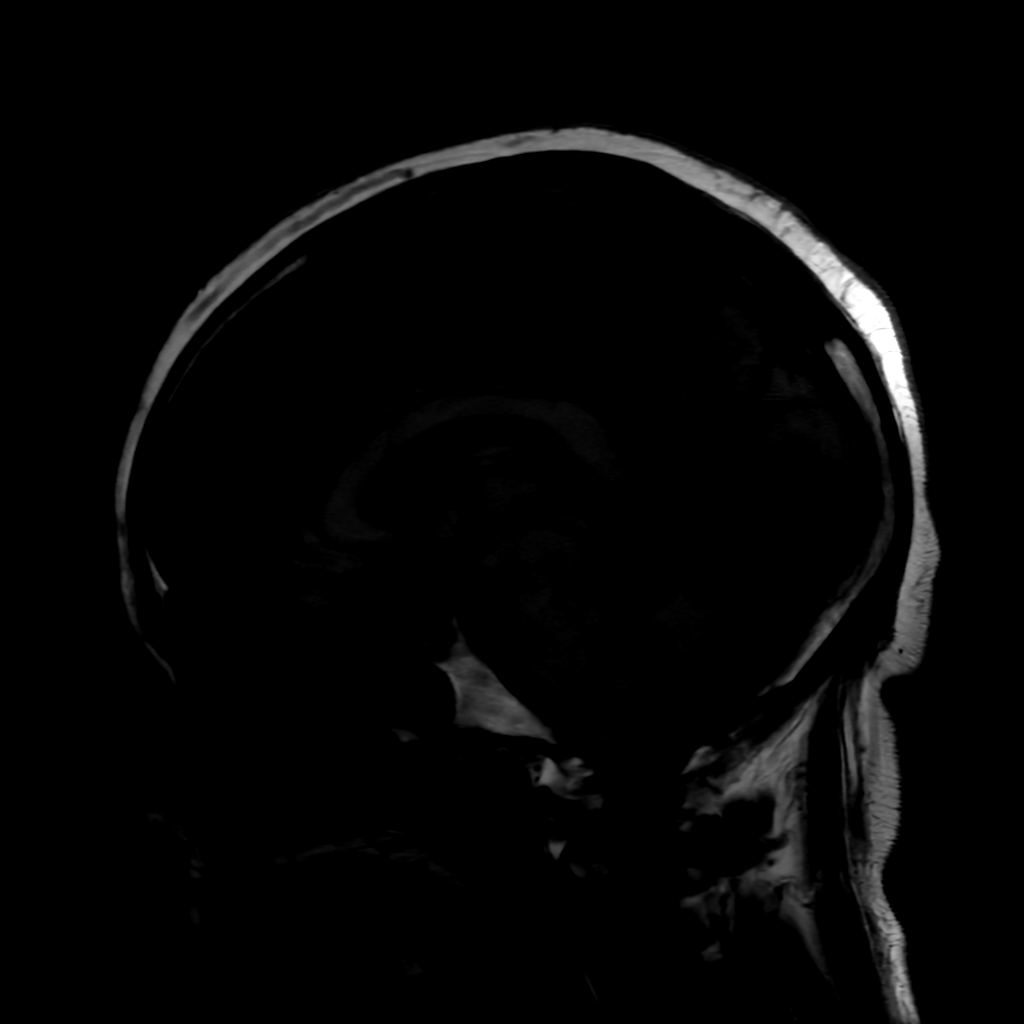
[im 23/23]
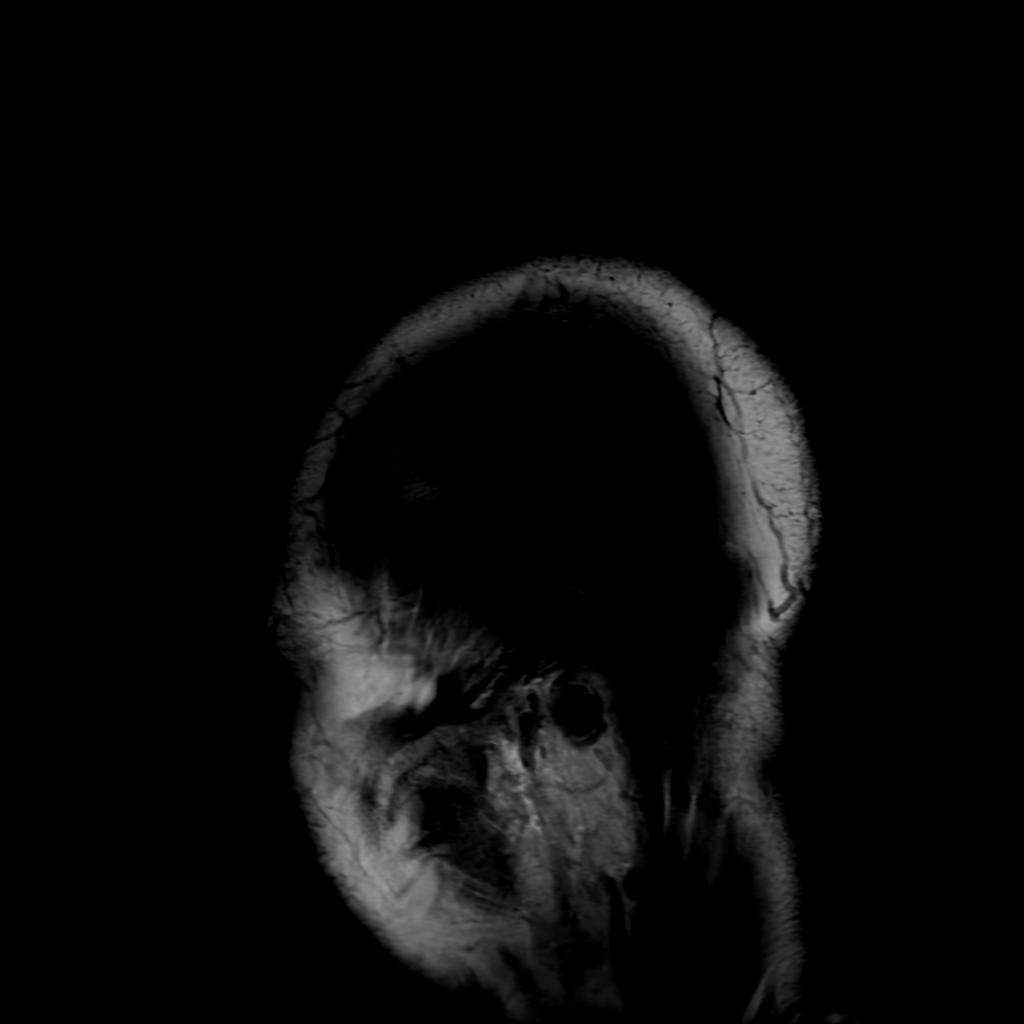

[21 of 48 positions shown; findings below may reference images not displayed]

FINDINGS: Brain: Area of restricted diffusion involving the superior right
cerebellar hemisphere extending to the superior cerebellar peduncle,
consistent with right superior cerebellar artery territory infarct.
There is mild mass effect with effacement of the fourth ventricle
which remains patent. No hydrocephalus. Remote infarct in the left
cerebellar hemisphere. Scattered foci of T2 hyperintensity are seen
within the white matter of the cerebral hemispheres, nonspecific.

Vascular: Normal flow voids.

Skull and upper cervical spine: Normal marrow signal.

Sinuses/Orbits: Opacification of the right maxillary sinus. Mucosal
thickening of the left maxillary sinus and bilateral ethmoid cells.
The orbits are maintained.

Other: None.
IMPRESSION: 1. Acute/subacute large right superior cerebellar artery territory
infarct. Mild mass effect on the fourth ventricle which remains
patent. No hydrocephalus. No evidence of hemorrhagic transformation.
2. Remote left superior cerebellar artery territory infarct.
3. Mild-to-moderate chronic microvascular ischemic changes of the
white matter.

## 2020-09-13 MED ORDER — GLUCERNA SHAKE PO LIQD
237.0000 mL | Freq: Three times a day (TID) | ORAL | Status: DC
  Administered 2020-09-13 – 2020-09-17 (×13): 237 mL via ORAL
  Filled 2020-09-13: qty 237

## 2020-09-13 MED ORDER — ADULT MULTIVITAMIN W/MINERALS CH
1.0000 | ORAL_TABLET | Freq: Every day | ORAL | Status: DC
  Administered 2020-09-13 – 2020-09-18 (×6): 1 via ORAL
  Filled 2020-09-13 (×6): qty 1

## 2020-09-13 NOTE — Progress Notes (Signed)
OT Cancellation Note  Patient Details Name: Malik Jones MRN: [id number] DOB: 1960-11-29   Cancelled Treatment:    Reason Eval/Treat Not Completed: Patient at procedure or test/ unavailable (MRI)  [place], OT/L   Acute OT Clinical Specialist Acute Rehabilitation Services Pager [phone] Office [phone]  09/13/2020, 11:44 AM

## 2020-09-13 NOTE — Progress Notes (Signed)
PROGRESS NOTE  Malik Jones [id number] DOB: 28-Nov-1960 DOA: 09/12/2020 PCP: Ellene Route   LOS: 1 day   Brief narrative:  Malik Jones is a 60 y.o. male with no significant past medical history presented to the emergency room at [facility] with dizziness and  falls with some discoordination on the right side and numbness on the left.  He was then brought into the emergency department and was noted to have acute cerebellar infarct.  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  Vascular surgery was consulted who recommended transfer to higher center.  Neurology saw the patient in the hospital who ordered MRI of the brain and echocardiogram.  Patient was then started on heparin drip and transferred to [facility] for further evaluation and treatment.   Assessment/Plan:  Principal Problem:   Cerebellar stroke 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  On heparin drip.  Vascular surgery and neurology was consulted at [facility] regional hospital who recommended admission to [facility] for further evaluation.  Continue heparin drip.  Lipid panel done  showed total cholesterol of 241 and LDL cholesterol of 169.  Continue neurochecks.   2D echocardiogram at [facility] showed LV ejection fraction of 70 to 75%.  CT head scan showed acute subacute nonhemorrhagic right superior cerebellar infarct.  MRI of the brain has been ordered, continue Lipitor.  Patient is on heparin drip so we will discontinue aspirin.  Follow neurology recommendations.   Falls, dizziness secondary to cerebellar stroke.  Physical therapy has seen the patient and recommended CIR.    Hyperlipidemia.  Continue statins.   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  He is unaware.  Hemoglobin A1c of 6.5.  Will need lifestyle  modification.  Might benefit from metformin prior to discharge.  We will get nutrition evaluation.  DVT prophylaxis:   Heparin drip  Code Status: Full code  Family Communication: None  Status is: Inpatient  Remains inpatient appropriate because:IV treatments appropriate due to intensity of illness or inability to take PO and Inpatient level of care appropriate due to severity of illness, awaiting neurology evaluation.  Dispo: The patient is from: Home              Anticipated d/c is to: CIR as per PT evaluation.              Patient currently is not medically stable to d/c.   Difficult to place patient No   Consultants: Neurology notified  Procedures: None  Anti-infectives:  None  Anti-infectives (From admission, onward)    None      Subjective: Today, patient was seen and examined at bedside.  Denies any dizziness lightheadedness today.  Objective: Vitals:   09/13/20 0403 09/13/20 0700  BP: 130/81 (!) 158/104  Pulse: 88 76  Resp: 17 15  Temp: 98.9 F (37.2 C) 98.1 F (36.7 C)  SpO2: 98% 99%   No intake or output data in the 24 hours ending 09/13/20 1108 There were no vitals filed for this visit. There is no height or weight on file to calculate BMI.   Physical Exam:  GENERAL: Patient is alert awake and oriented. Not in obvious distress. HENT: No scleral pallor or icterus. Pupils equally reactive to light. Oral mucosa is moist NECK: is supple, no gross swelling noted. CHEST: Clear to auscultation. No crackles or wheezes.  Diminished breath sounds bilaterally. CVS: S1 and S2 heard, no murmur. Regular rate and rhythm.  Mild tachycardia noted. ABDOMEN: Soft, non-tender, bowel sounds are present. EXTREMITIES: No edema. CNS: Cranial nerves are intact.  Minimal right sided weakness.  Finger-to-nose test intact.  Gait not tested. SKIN: warm and dry without rashes.  Data Review: I have personally reviewed the following laboratory data and  studies,  CBC: Recent Labs  Lab 09/11/20 0939 09/12/20 0930 09/13/20 0146  WBC 6.5 8.4 5.9  NEUTROABS  --  6.5  --   HGB 14.7 16.3 15.3  HCT 43.3 48.2 46.2  MCV 94.3 93.6 94.5  PLT 335 373 [id number]   Basic Metabolic Panel: Recent Labs  Lab 09/11/20 0939 09/12/20 0930 09/13/20 0146  NA 133* 136 134*  K 3.8 3.8 3.6  CL 96* 100 101  CO2 '22 24 23  '$ GLUCOSE 186* 139* 118*  BUN '12 14 14  '$ CREATININE 0.90 0.86 0.99  CALCIUM 9.7 10.0 9.5  MG  --   --  2.4   Liver Function Tests: No results for input(s): AST, ALT, ALKPHOS, BILITOT, PROT, ALBUMIN in the last 168 hours. No results for input(s): LIPASE, AMYLASE in the last 168 hours. No results for input(s): AMMONIA in the last 168 hours. Cardiac Enzymes: No results for input(s): CKTOTAL, CKMB, CKMBINDEX, TROPONINI in the last 168 hours. BNP (last 3 results) No results for input(s): BNP in the last 8760 hours.  ProBNP (last 3 results) No results for input(s): PROBNP in the last 8760 hours.  CBG: Recent Labs  Lab 09/12/20 0732  GLUCAP 115*   Recent Results (from the past 240 hour(s))  Resp Panel by RT-PCR (Flu A&B, Covid) Nasopharyngeal Swab     Status: None   Collection Time: 09/11/20 12:08 PM   Specimen: Nasopharyngeal Swab; Nasopharyngeal(NP) swabs in vial transport medium  Result Value Ref Range Status   SARS Coronavirus 2 by RT PCR NEGATIVE NEGATIVE Final    Comment: (NOTE) SARS-CoV-2 target nucleic acids are NOT DETECTED.  The SARS-CoV-2 RNA is generally detectable in upper respiratory specimens during the acute phase of infection. The lowest concentration of SARS-CoV-2 viral copies this assay can detect is 138 copies/mL. A negative result does not preclude SARS-Cov-2 infection and should not be used as the sole basis for treatment or other patient management decisions. A negative result may occur with  improper specimen collection/handling, submission of specimen other than nasopharyngeal swab, presence of viral  mutation(s) within the areas targeted by this assay, and inadequate number of viral copies(<138 copies/mL). A negative result must be combined with clinical observations, patient history, and epidemiological information. The expected result is Negative.  Fact Sheet for Patients:  EntrepreneurPulse.com.au  Fact Sheet for Healthcare Providers:  IncredibleEmployment.be  This test is no t yet approved or cleared by the [place] FDA and  has been authorized for detection and/or diagnosis of SARS-CoV-2 by FDA under an Emergency Use Authorization (EUA). This EUA will remain  in effect (meaning this test can be used) for the duration of the COVID-19 declaration under Section 564(b)(1) of the Act, 21 U.S.C.section 360bbb-3(b)(1), unless the authorization is terminated  or revoked sooner.       Influenza A by PCR NEGATIVE NEGATIVE Final   Influenza B by PCR NEGATIVE NEGATIVE Final    Comment: (NOTE) The Xpert Xpress SARS-CoV-2/FLU/RSV plus assay is intended as an aid in the diagnosis of influenza from Nasopharyngeal swab specimens and should not be used as a sole basis for treatment.  Nasal washings and aspirates are unacceptable for Xpert Xpress SARS-CoV-2/FLU/RSV testing.  Fact Sheet for Patients: EntrepreneurPulse.com.au  Fact Sheet for Healthcare Providers: IncredibleEmployment.be  This test is not yet approved or cleared by the [place] FDA and has been authorized for detection and/or diagnosis of SARS-CoV-2 by FDA under an Emergency Use Authorization (EUA). This EUA will remain in effect (meaning this test can be used) for the duration of the COVID-19 declaration under Section 564(b)(1) of the Act, 21 U.S.C. section 360bbb-3(b)(1), unless the authorization is terminated or revoked.  Performed at [facility] [address] [place], [place] [address]      Studies: DG Chest 2  View  Result Date: 09/11/2020 CLINICAL DATA:  Pneumonia, confusion EXAM: CHEST - 2 VIEW COMPARISON:  None. FINDINGS: Cardiomegaly. Subtle heterogeneous airspace opacity of the peripheral right upper lobe. The visualized skeletal structures are unremarkable. IMPRESSION: 1. Subtle heterogeneous airspace opacity of the peripheral right upper lobe, concerning for infection. 2. Cardiomegaly. Electronically Signed   By: Eddie Candle M.D.   On: 09/11/2020 15:01   ECHOCARDIOGRAM COMPLETE  Result Date: 09/12/2020    ECHOCARDIOGRAM REPORT   Patient Name:   ZINEDINE MAJID Date of Exam: 09/12/2020 Medical Rec #:  [id number]      Height:       75.0 in Accession #:    [id number]     Weight:       163.0 lb Date of Birth:  1960/06/23      BSA:          2.012 m Patient Age:    57 years       BP:           188/106 mmHg Patient Gender: M              HR:           103 bpm. Exam Location:  [facility] Procedure: 2D Echo, Color Doppler and Cardiac Doppler Indications:     I63.9 Stroke  History:         Patient has no prior history of Echocardiogram examinations.  Sonographer:     Charmayne Sheer RDCS (AE) Referring Phys:  [id number] ASHISH ARORA Diagnosing Phys: Serafina Royals MD  Sonographer Comments: Technically difficult study due to poor echo windows. Image acquisition challenging due to uncooperative patient and Image acquisition challenging due to respiratory motion. IMPRESSIONS  1. Left ventricular ejection fraction, by estimation, is 70 to 75%. The left ventricle has hyperdynamic function. The left ventricle has no regional wall motion abnormalities. Left ventricular diastolic parameters were normal.  2. Right ventricular systolic function is normal. The right ventricular size is normal.  3. The mitral valve is normal in structure. Trivial mitral valve regurgitation.  4. The aortic valve is normal in structure. Aortic valve regurgitation is not visualized. FINDINGS  Left Ventricle: Left ventricular ejection fraction, by estimation, is 70 to  75%. The left ventricle has hyperdynamic function. The left ventricle has no regional wall motion abnormalities. The left ventricular internal cavity size was small. There is no  left ventricular hypertrophy. Left ventricular diastolic parameters were normal. Right Ventricle: The right ventricular size is normal. No increase in right ventricular wall thickness. Right ventricular systolic function is normal. Left Atrium: Left atrial size was normal in size. Right Atrium: Right atrial size was normal in size. Pericardium: There is no evidence of pericardial effusion. Mitral Valve: The mitral valve is normal in structure. Trivial mitral valve regurgitation. MV peak gradient, 2.9 mmHg. The mean  mitral valve gradient is 1.0 mmHg. Tricuspid Valve: The tricuspid valve is normal in structure. Tricuspid valve regurgitation is trivial. Aortic Valve: The aortic valve is normal in structure. Aortic valve regurgitation is not visualized. Aortic valve mean gradient measures 5.0 mmHg. Aortic valve peak gradient measures 9.6 mmHg. Aortic valve area, by VTI measures 2.34 cm. Pulmonic Valve: The pulmonic valve was normal in structure. Pulmonic valve regurgitation is not visualized. Aorta: The aortic root and ascending aorta are structurally normal, with no evidence of dilitation. IAS/Shunts: No atrial level shunt detected by color flow Doppler.  LEFT VENTRICLE PLAX 2D LVIDd:         3.70 cm  Diastology LVIDs:         2.30 cm  LV e' medial:    5.44 cm/s LV PW:         0.90 cm  LV E/e' medial:  9.2 LV IVS:        0.80 cm  LV e' lateral:   7.29 cm/s LVOT diam:     2.10 cm  LV E/e' lateral: 6.9 LV SV:         46 LV SV Index:   23 LVOT Area:     3.46 cm  LEFT ATRIUM             Index LA diam:        2.30 cm 1.14 cm/m LA Vol (A2C):   26.2 ml 13.02 ml/m LA Vol (A4C):   22.0 ml 10.94 ml/m LA Biplane Vol: 24.5 ml 12.18 ml/m  AORTIC VALVE                    PULMONIC VALVE AV Area (Vmax):    2.37 cm     PV Vmax:       1.15 m/s AV Area  (Vmean):   2.23 cm     PV Vmean:      80.800 cm/s AV Area (VTI):     2.34 cm     PV VTI:        0.150 m AV Vmax:           155.00 cm/s  PV Peak grad:  5.3 mmHg AV Vmean:          105.000 cm/s PV Mean grad:  3.0 mmHg AV VTI:            0.197 m AV Peak Grad:      9.6 mmHg AV Mean Grad:      5.0 mmHg LVOT Vmax:         106.00 cm/s LVOT Vmean:        67.600 cm/s LVOT VTI:          0.133 m LVOT/AV VTI ratio: 0.68  AORTA Ao Root diam: 3.50 cm MITRAL VALVE MV Area (PHT): 7.99 cm    SHUNTS MV Area VTI:   3.71 cm    Systemic VTI:  0.13 m MV Peak grad:  2.9 mmHg    Systemic Diam: 2.10 cm MV Mean grad:  1.0 mmHg MV Vmax:       0.85 m/s MV Vmean:      57.9 cm/s MV Decel Time: 95 msec MV E velocity: 50.10 cm/s MV A velocity: 78.00 cm/s MV E/A ratio:  0.64 Serafina Royals MD Electronically signed by Serafina Royals MD Signature Date/Time: 09/12/2020/2:42:46 PM    Final    CT ANGIO HEAD NECK W WO CM W PERF  Result Date: 09/11/2020 CLINICAL DATA:  Stroke, follow-up EXAM:  CT ANGIOGRAPHY HEAD AND NECK CT PERFUSION BRAIN TECHNIQUE: Multidetector CT imaging of the head and neck was performed using the standard protocol during bolus administration of intravenous contrast. Multiplanar CT image reconstructions and MIPs were obtained to evaluate the vascular anatomy. Carotid stenosis measurements (when applicable) are obtained utilizing NASCET criteria, using the distal internal carotid diameter as the denominator. Multiphase CT imaging of the brain was performed following IV bolus contrast injection. Subsequent parametric perfusion maps were calculated using RAPID software. CONTRAST:  167m OMNIPAQUE IOHEXOL 350 MG/ML SOLN COMPARISON:  None. FINDINGS: CTA NECK Aortic arch: There is focal free-floating or adherent thrombus along the ascending thoracic aorta. Minor calcified plaque along the arch. Great vessel origins are patent with poor visualization of the left common carotid origin due to streak artifact from adjacent venous  contrast. Right carotid system: Post patent.  No stenosis. Left carotid system: Patent.  No stenosis. Vertebral arteries: Patent. Suboptimal evaluation due to adjacent venous contrast. Left vertebral is dominant. No stenosis identified. Skeleton: No significant abnormality. Other neck: Unremarkable. Upper chest: Enlarged 1.2 cm right paratracheal node. Nonenlarged 9 mm prevascular node. Foci of nodular consolidation in the visualized lungs. Review of the MIP images confirms the above findings CTA HEAD Suboptimal contrast bolus. Anterior circulation: Intracranial internal carotid arteries are patent with trace calcified plaque. Anterior cerebral arteries are patent. Right A1 ACA is dominant. Middle cerebral arteries are patent. Posterior circulation: Intracranial vertebral arteries are patent. Basilar artery is patent. Major cerebellar artery origins are not well evaluated due to contrast timing. Right superior cerebellar artery origin is patent with suspected subsequent occlusion. Posterior cerebral arteries are patent. Venous sinuses: Patent as allowed by contrast bolus timing. Review of the MIP images confirms the above findings CT Brain Perfusion Findings: CBF (<30%) Volume: 066mPerfusion (Tmax>6.0s) volume: 80m4mismatch Volume: 80mL5mfarction Location:Area of territory at risk partially corresponds to right superior cerebellar infarction on noncontrast head CT. IMPRESSION: Suboptimal contrast bolus timing. Focal free floating or adherent thrombus along the ascending thoracic aorta. Major cerebellar artery origins are not well evaluated due to contrast timing. Probable patent right superior cerebellar artery origin with suspected subsequent occlusion. Perfusion imaging is likely providing incomplete information in the posterior fossa. There is 3 mL of penumbra identified partially corresponding to the area of right superior cerebellar infarction on the noncontrast head CT. Foci of nodular consolidation in the  visualized lungs suspicious for pneumonia. Reactive mediastinal adenopathy. Initial results were discussed by telephone at the time of interpretation on 09/11/2020 at 1:10 pm to provider [facility] verbally acknowledged these results. Electronically Signed   By: PranMacy Mis.   On: 09/11/2020 13:23      LaxmFlora Lipps  [facility] 09/13/2020  If 7PM-7AM, please contact night-coverage

## 2020-09-13 NOTE — Evaluation (Signed)
Physical Therapy Evaluation Patient Details Name: Malik Jones MRN: [id number] DOB: 1960-08-14 Today's Date: 09/13/2020   History of Present Illness  Pt admitted to [facility] on 09/11/20 for evaluation of unsteadiness, R sided weakness, and L sided numbness. Speech is mildly dysarthric. 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 Pt without significant PMH.   Clinical Impression  Pt presents with condition above and deficits mentioned below, see PT Problem List. PTA, he was living alone in a 1-level house with 4-5 STE, driving, functioning independently, and working in retail at [employer]. 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 Dysmetria/ataxia noted in bil upper extremities also, again R worse than L. Pt is at high risk for falls. He is currently requiring minA for transfers and min-modA for short household gait distances using a RW. Pt would greatly benefit from intensive therapy in the CIR setting prior to return home to maximize his safety and independence with all functional mobility. Will continue to follow acutely.     Follow Up Recommendations CIR;Supervision/Assistance - 24 hour    Equipment Recommendations  Rolling walker with 5" wheels;3in1 (PT)    Recommendations for Other Services Rehab consult     Precautions / Restrictions Precautions Precautions: Fall Precaution Comments: ataxia Restrictions Weight Bearing Restrictions: No      Mobility  Bed Mobility Overal bed mobility: Needs Assistance Bed Mobility: Supine to Sit     Supine to sit: Supervision     General bed mobility comments: Bed flat and no use of rails, pt displaying incoordination with R UE to push up to  sit, supervision for safety.    Transfers Overall transfer level: Needs assistance Equipment used: Rolling walker (2 wheeled) Transfers: Sit to/from Stand Sit to Stand: Min assist         General transfer comment: Cues to keep R hand on RW but push up with L UE due to his ataxia, success but minA to steady with power up to stand.  Ambulation/Gait Ambulation/Gait assistance: Min assist;Mod assist Gait Distance (Feet): 50 Feet Assistive device: Rolling walker (2 wheeled) Gait Pattern/deviations: Step-through pattern;Decreased stride length;Ataxic;Trunk flexed Gait velocity: reduced Gait velocity interpretation: <1.31 ft/sec, indicative of household ambulator General Gait Details: Pt with downward gaze and trunk flexed, watching his feet placement throughout. Cues provided to aim feet for square tiles to decrease his poor placement due to ataxia. Increased narrowing of feet as distance and fatigue progressed, needing min > modA for stability.  Stairs            Wheelchair Mobility    Modified Rankin (Stroke Patients Only) Modified Rankin (Stroke Patients Only) Pre-Morbid Rankin Score: No symptoms Modified Rankin: Moderately severe disability     Balance Overall balance assessment: Needs assistance Sitting-balance support: No upper extremity supported;Feet supported Sitting balance-Leahy Scale: Fair Sitting balance - Comments: Reaching min off BOS to donn socks with min guard assist, truncal instability noted.   Standing balance support: Bilateral upper extremity supported Standing balance-Leahy Scale: Poor Standing balance comment: Relaint on UE support and min-modA for standing mobility.                             Pertinent Vitals/Pain Pain Assessment: Faces Faces Pain Scale: No hurt  Pain Intervention(s): Monitored during session    Home Living Family/patient expects to be discharged to:: Private residence Living Arrangements: Alone Available Help  at Discharge: Family;Available 24 hours/day Type of Home: House Home Access: Stairs to enter Entrance Stairs-Rails: None Entrance Stairs-Number of Steps: 4-5 Home Layout: One level Home Equipment: None      Prior Function Level of Independence: Independent         Comments: Drives and works in Scientist, research (medical) at [employer].     Hand Dominance   Dominant Hand: Right    Extremity/Trunk Assessment   Upper Extremity Assessment Upper Extremity Assessment: Defer to OT evaluation;RUE deficits/detail;LUE deficits/detail RUE Deficits / Details: Dysmetria/ataxia noted, worse in R than L LUE Deficits / Details: Dysmetria/ataxia noted, worse in R than L    Lower Extremity Assessment Lower Extremity Assessment: RLE deficits/detail;LLE deficits/detail RLE Deficits / Details: dysmetria/dysdiadochokinesia/ataxia noted (R worse than L); gross strength of 5/5 MMT throughout; sensation to light touch intact RLE Coordination: decreased fine motor;decreased gross motor LLE Deficits / Details: dysmetria/dysdiadochokinesia/ataxia noted (R worse than L); gross strength of 5/5 MMT throughout; slight numbness to light touch noted throughout LLE Sensation: decreased light touch LLE Coordination: decreased fine motor;decreased gross motor    Cervical / Trunk Assessment Cervical / Trunk Assessment: Normal  Communication   Communication: No difficulties  Cognition Arousal/Alertness: Awake/alert Behavior During Therapy: Flat affect Overall Cognitive Status: Within Functional Limits for tasks assessed                                 General Comments: A&Ox4. Follows commands and waits for therapist appropriately throughout. Flat affect throughout.      General Comments General comments (skin integrity, edema, etc.): VSS on RA; denied dizziness throughout    Exercises     Assessment/Plan    PT Assessment Patient needs continued PT services  PT Problem List Decreased activity  tolerance;Decreased balance;Decreased mobility;Decreased coordination;Decreased knowledge of use of DME;Impaired sensation       PT Treatment Interventions DME instruction;Gait training;Stair training;Functional mobility training;Therapeutic activities;Therapeutic exercise;Balance training;Neuromuscular re-education;Cognitive remediation;Patient/family education    PT Goals (Current goals can be found in the Care Plan section)  Acute Rehab PT Goals Patient Stated Goal: to improve and go home PT Goal Formulation: With patient/family Time For Goal Achievement: 09/27/20 Potential to Achieve Goals: Good    Frequency Min 4X/week   Barriers to discharge        Co-evaluation               AM-PAC PT "6 Clicks" Mobility  Outcome Measure Help needed turning from your back to your side while in a flat bed without using bedrails?: A Little Help needed moving from lying on your back to sitting on the side of a flat bed without using bedrails?: A Little Help needed moving to and from a bed to a chair (including a wheelchair)?: A Lot Help needed standing up from a chair using your arms (e.g., wheelchair or bedside chair)?: A Little Help needed to walk in hospital room?: A Lot Help needed climbing 3-5 steps with a railing? : Total 6 Click Score: 14    End of Session Equipment Utilized During Treatment: Gait belt Activity Tolerance: Patient tolerated treatment well Patient left: in chair;with call bell/phone within reach;with chair alarm set Nurse Communication: Mobility status PT Visit Diagnosis: Unsteadiness on feet (R26.81);Other abnormalities of gait and mobility (R26.89);Ataxic gait (R26.0);Difficulty in walking, not elsewhere  classified (R26.2);Other symptoms and signs involving the nervous system (R29.898)    Time: [id number] PT Time Calculation (min) (ACUTE ONLY): 45 min   Charges:   PT Evaluation $PT Eval Moderate Complexity: 1 Mod PT Treatments $Gait Training: 8-22  mins $Therapeutic Activity: 8-22 mins        Moishe Spice, PT, DPT Acute Rehabilitation Services  Pager: [phone] Office: [phone]   [place] 09/13/2020, 10:30 AM

## 2020-09-13 NOTE — Progress Notes (Signed)
ANTICOAGULATION CONSULT NOTE - Follow Up Consult  Pharmacy Consult for heparin Indication: stroke and aortic thrombus   Labs: Recent Labs    09/11/20 0939 09/11/20 1324 09/11/20 1452 09/11/20 2238 09/12/20 0724 09/12/20 0930 09/12/20 1826 09/13/20 0146  HGB 14.7  --   --   --   --  16.3  --  15.3  HCT 43.3  --   --   --   --  48.2  --  46.2  PLT 335  --   --   --   --  373  --  398  APTT  --   --  26  --   --   --   --   --   LABPROT 13.0  --   --   --   --   --   --   --   INR 1.0  --   --   --   --   --   --   --   HEPARINUNFRC  --   --   --    < > 0.27*  --  0.19* 0.68  CREATININE 0.90  --   --   --   --  0.86  --  0.99  TROPONINIHS 30* 52*  --   --   --   --   --   --    < > = values in this interval not displayed.   Assessment: 60yo male now supratherapeutic on heparin after rate change, likely accumulating; no infusion issues or signs of bleeding per RN.  Goal of Therapy:  Heparin level 0.3-0.5 units/ml   Plan:  Will decrease heparin infusion by 2 units/kg/hr to 1300 units/hr and check level in 6 hours.    Wynona Neat, PharmD, BCPS  09/13/2020,3:20 AM

## 2020-09-13 NOTE — TOC Initial Note (Signed)
Transition of Care [facility]) - Initial/Assessment Note    Patient Details  Name: Malik Jones MRN: [id number] Date of Birth: 1960-08-17  [facility]) CM/SW Contact:    Tom-Johnson, Renea Ee, RN Phone Number: 09/13/2020, 2:29 PM  Clinical Narrative:                 Patient checked on to see if there is any Case manager needs. Patient states he lives alone but family comes around and help. Goes to [facility] for health issues. Does not have any problems with medication. Drives self to appointments. No further needs at this time.  Expected Discharge Plan: IP Rehab Facility Barriers to Discharge: Continued Medical Work up   Patient Goals and CMS Choice Patient states their goals for this hospitalization and ongoing recovery are:: To get stronger and go home.      Expected Discharge Plan and Services Expected Discharge Plan: [place] In-house Referral: NA     Living arrangements for the past 2 months: [place]                                      Prior Living Arrangements/Services Living arrangements for the past 2 months: Single Family Home Lives with:: Self Patient language and need for interpreter reviewed:: Yes Do you feel safe going back to the place where you live?: Yes      Need for Family Participation in Patient Care: Yes (Comment) Care giver support system in place?: Yes (comment)   Criminal Activity/Legal Involvement Pertinent to Current Situation/Hospitalization: No - Comment as needed  Activities of Daily Living      Permission Sought/Granted Permission sought to share information with : Case Manager, Customer service manager Permission granted to share information with : Yes, Verbal Permission Granted              Emotional Assessment Appearance:: Appears stated age Attitude/Demeanor/Rapport: Engaged Affect (typically observed): Accepting Orientation: : Oriented to Self, Oriented to Place,  Oriented to  Time, Oriented to Situation Alcohol / Substance Use: Illicit Drugs (Cannabinoid) Psych Involvement: No (comment)  Admission diagnosis:  Stroke [facility]) [I63.9] Patient Active Problem List   Diagnosis Date Noted   Stroke ([place]) 09/12/2020   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

## 2020-09-13 NOTE — Consult Note (Signed)
[place] for  IV Heparin Indication: stroke and aortic thrombus  No Known Allergies  Patient Measurements: Total Body Weight: 73.9 kg Height: 75 inches Heparin Dosing Weight: 73.9 kg  Vital Signs: Temp: 98.2 F (36.8 C) (08/05 2021) Temp Source: Oral (08/05 2021) BP: 146/98 (08/05 2021) Pulse Rate: 75 (08/05 2021)  Labs: Recent Labs    09/11/20 0939 09/11/20 1324 09/11/20 1452 09/11/20 2238 09/12/20 0930 09/12/20 1826 09/13/20 0146 09/13/20 0949 09/13/20 2004  HGB 14.7  --   --   --  16.3  --  15.3  --   --   HCT 43.3  --   --   --  48.2  --  46.2  --   --   PLT 335  --   --   --  373  --  398  --   --   APTT  --   --  26  --   --   --   --   --   --   LABPROT 13.0  --   --   --   --   --   --   --   --   INR 1.0  --   --   --   --   --   --   --   --   HEPARINUNFRC  --   --   --    < >  --    < > 0.68 0.48 0.33  CREATININE 0.90  --   --   --  0.86  --  0.99  --   --   TROPONINIHS 30* 52*  --   --   --   --   --   --   --    < > = values in this interval not displayed.   Estimated Creatinine Clearance: 85 mL/min (by C-G formula based on SCr of 0.99 mg/dL).  Medical History: Past Medical History:  Diagnosis Date   Medical history non-contributory     Assessment: 60 yr old man presented to the [facility] ED on 09/11/20 with L-sided weakness; head CT showed R nonhemorrhagic cerebellar stroke (pt out of window for tPA) and  thrombus along ascending thoracic aorta (likely the etiology of embolic infarction). Pt was started on heparin infusion at [facility]. He was transferred to [facility] this afternoon for further evaluation. Pt was on no anticoagulants PTA to [facility].  Heparin level came back therapeutic at 0.33, on 1300 units/hr. No s/sx of bleeding or infusion issues. CBC stable.   Goal of Therapy:  Heparin level: 0.3-0.5 units/ml (stroke protocol, no boluses due to recent stroke) Monitor platelets by anticoagulation protocol:  Yes   Plan:  Continue heparin infusion at 1300 units/hr Monitor daily heparin level, CBC Monitor for bleeding  Alanda Slim, PharmD, [facility] Clinical Pharmacist Please see AMION for all Pharmacists' Contact Phone Numbers 09/13/2020, 9:05 PM

## 2020-09-13 NOTE — Consult Note (Signed)
[place] for  IV Heparin Indication: stroke and aortic thrombus  No Known Allergies  Patient Measurements: Total Body Weight: 73.9 kg Height: 75 inches Heparin Dosing Weight: 73.9 kg  Vital Signs: Temp: 98.1 F (36.7 C) (08/05 0700) Temp Source: Oral (08/05 0700) BP: 158/104 (08/05 0700) Pulse Rate: 76 (08/05 0700)  Labs: Recent Labs    09/11/20 0939 09/11/20 1324 09/11/20 1452 09/11/20 2238 09/12/20 0930 09/12/20 1826 09/13/20 0146 09/13/20 0949  HGB 14.7  --   --   --  16.3  --  15.3  --   HCT 43.3  --   --   --  48.2  --  46.2  --   PLT 335  --   --   --  373  --  398  --   APTT  --   --  26  --   --   --   --   --   LABPROT 13.0  --   --   --   --   --   --   --   INR 1.0  --   --   --   --   --   --   --   HEPARINUNFRC  --   --   --    < >  --  0.19* 0.68 0.48  CREATININE 0.90  --   --   --  0.86  --  0.99  --   TROPONINIHS 30* 52*  --   --   --   --   --   --    < > = values in this interval not displayed.   Estimated Creatinine Clearance: 85 mL/min (by C-G formula based on SCr of 0.99 mg/dL).  Medical History: Past Medical History:  Diagnosis Date   Medical history non-contributory     Assessment: 60 yr old man presented to the [facility] ED on 09/11/20 with L-sided weakness; head CT showed R nonhemorrhagic cerebellar stroke (pt out of window for tPA) and  thrombus along ascending thoracic aorta (likely the etiology of embolic infarction). Pt was started on heparin infusion at [facility]. He was transferred to [facility] this afternoon for further evaluation. Pt was on no anticoagulants PTA to [facility].  Heparin level came back therapeutic at 0.48, on 1300 units/hr. No s/sx of bleeding or infusion issues. CBC stable.   Goal of Therapy:  Heparin level: 0.3-0.5 units/ml (stroke protocol, no boluses due to recent stroke) Monitor platelets by anticoagulation protocol: Yes   Plan:  Continue heparin infusion at 1300  units/hr Given on higher end of goal and clinical pic, will confirm level in 6 hours  Monitor daily heparin level, CBC Monitor for bleeding  Antonietta Jewel, PharmD, BCCCP Clinical Pharmacist  Phone: [phone] 09/13/2020 11:35 AM  Please check AMION for all [place] phone numbers After 10:00 PM, call [place] [phone]

## 2020-09-13 NOTE — Progress Notes (Signed)
STROKE TEAM PROGRESS NOTE   HISTORY OF PRESENT ILLNESS (per record) Malik Jones is a 60 y.o. male with no significant medical history other than marijuana use who presented to [facility] on 09/11/2020 for evaluation of gait disturbance and clumsiness on the right upper and lower extremities. He noted on 09/10/2020 around 20:00 that his gait was uncoordinated with right lower extremity clumsiness with initial reports of left upper and lower extremity numbness. A CT head was obtained revealing an acute/subacute right superior cerebellar infarction as evidenced by a formed hypodensity. Vessel imaging was obtained with evidence of an adherent or free floating thrombus in the ascending aorta. He was started on a heparin drip under stroke protocol and transferred to [facility] for ongoing evaluation and access to a tertiary care center in the event that his condition should worsen with his aortic thrombus requiring cardiothoracic surgery. He arrived to [facility] on 09/12/2020. On arrival, Mr. Saah continues to endorse incoordination with the right upper and lower extremities with right lower extremity tingling sensation but without further reports of sensory deficits on the left.  LKW:  09/10/2020 20:00 tpa given?: no, outside of time window   INTERVAL HISTORY Patient is lying comfortably in bed.  He still has some right-sided ataxia but he feels he is doing better.  Vital signs are stable. MRI scan of the brain done today confirms large subacute to acute right superior cerebellar infarct with mild mass-effect on fourth ventricle but no hydrocephalus or hemorrhagic transformation.  Remote his left superior cerebellar artery infarct is noted. CT angiogram done at [facility] shows a free-floating or adherent thrombus along ascending thoracic aorta but no large vessel stenosis or occlusion. Echocardiogram shows hyperdynamic left ventricular function with ejection fraction 7075%. LDL cholesterol is elevated 169 mg  percent and hemoglobin A1c 6.5.  Urine drug screen is positive for marijuana  OBJECTIVE Vitals:   09/12/20 2043 09/12/20 2348 09/13/20 0403 09/13/20 0700  BP:  127/88 130/81 (!) 158/104  Pulse: 96 98 88 76  Resp:  '17 17 15  '$ Temp:  99.9 F (37.7 C) 98.9 F (37.2 C) 98.1 F (36.7 C)  TempSrc:  Oral Oral Oral  SpO2:  97% 98% 99%    CBC:  Recent Labs  Lab 09/12/20 0930 09/13/20 0146  WBC 8.4 5.9  NEUTROABS 6.5  --   HGB 16.3 15.3  HCT 48.2 46.2  MCV 93.6 94.5  PLT 373 [id number]    Basic Metabolic Panel:  Recent Labs  Lab 09/12/20 0930 09/13/20 0146  NA 136 134*  K 3.8 3.6  CL 100 101  CO2 24 23  GLUCOSE 139* 118*  BUN 14 14  CREATININE 0.86 0.99  CALCIUM 10.0 9.5  MG  --  2.4    Lipid Panel:     Component Value Date/Time   CHOL 251 (H) 09/12/2020 0930   TRIG 111 09/12/2020 0930   HDL 60 09/12/2020 0930   CHOLHDL 4.2 09/12/2020 0930   VLDL 22 09/12/2020 0930   LDLCALC 169 (H) 09/12/2020 0930   HgbA1c:  Lab Results  Component Value Date   HGBA1C 6.5 (H) 09/12/2020   Urine Drug Screen:     Component Value Date/Time   LABOPIA NONE DETECTED 09/12/2020 0930   COCAINSCRNUR NONE DETECTED 09/12/2020 0930   LABBENZ NONE DETECTED 09/12/2020 0930   AMPHETMU NONE DETECTED 09/12/2020 0930   THCU POSITIVE (A) 09/12/2020 0930   LABBARB NONE DETECTED 09/12/2020 0930    Alcohol Level No results found for: [facility]  IMAGING   DG Chest 2 View  Result Date: 09/11/2020 CLINICAL DATA:  Pneumonia, confusion EXAM: CHEST - 2 VIEW COMPARISON:  None. FINDINGS: Cardiomegaly. Subtle heterogeneous airspace opacity of the peripheral right upper lobe. The visualized skeletal structures are unremarkable. IMPRESSION: 1. Subtle heterogeneous airspace opacity of the peripheral right upper lobe, concerning for infection. 2. Cardiomegaly. Electronically Signed   By: Eddie Candle M.D.   On: 09/11/2020 15:01   MR BRAIN WO CONTRAST  Result Date: 09/13/2020 CLINICAL DATA:  Stroke suspected.  EXAM: MRI HEAD WITHOUT CONTRAST TECHNIQUE: Multiplanar, multiecho pulse sequences of the brain and surrounding structures were obtained without intravenous contrast. COMPARISON:  Head CT September 11, 2020. FINDINGS: Brain: Area of restricted diffusion involving the superior right cerebellar hemisphere extending to the superior cerebellar peduncle, consistent with right superior cerebellar artery territory infarct. There is mild mass effect with effacement of the fourth ventricle which remains patent. No hydrocephalus. Remote infarct in the left cerebellar hemisphere. Scattered foci of T2 hyperintensity are seen within the white matter of the cerebral hemispheres, nonspecific. Vascular: Normal flow voids. Skull and upper cervical spine: Normal marrow signal. Sinuses/Orbits: Opacification of the right maxillary sinus. Mucosal thickening of the left maxillary sinus and bilateral ethmoid cells. The orbits are maintained. Other: None. IMPRESSION: 1. 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 Mild mass effect on the fourth ventricle which remains patent. No hydrocephalus. No evidence of hemorrhagic transformation. 2. Remote left superior cerebellar artery territory infarct. 3. Mild-to-moderate chronic microvascular ischemic changes of the white matter. Electronically Signed   By: Pedro Earls M.D.   On: 09/13/2020 13:15   ECHOCARDIOGRAM COMPLETE  Result Date: 09/12/2020    ECHOCARDIOGRAM REPORT   Patient Name:   Malik Jones Date of Exam: 09/12/2020 Medical Rec #:  [id number]      Height:       75.0 in Accession #:    [id number]     Weight:       163.0 lb Date of Birth:  July 20, 1960      BSA:          2.012 m Patient Age:    33 years       BP:           188/106 mmHg Patient Gender: M              HR:           103 bpm. Exam Location:  [facility] Procedure: 2D Echo, Color Doppler and Cardiac Doppler Indications:     I63.9 Stroke  History:         Patient has no prior history of  Echocardiogram examinations.  Sonographer:     Charmayne Sheer RDCS (AE) Referring Phys:  [id number] ASHISH ARORA Diagnosing Phys: Serafina Royals MD  Sonographer Comments: Technically difficult study due to poor echo windows. Image acquisition challenging due to uncooperative patient and Image acquisition challenging due to respiratory motion. IMPRESSIONS  1. Left ventricular ejection fraction, by estimation, is 70 to 75%. The left ventricle has hyperdynamic function. The left ventricle has no regional wall motion abnormalities. Left ventricular diastolic parameters were normal.  2. Right ventricular systolic function is normal. The right ventricular size is normal.  3. The mitral valve is normal in structure. Trivial mitral valve regurgitation.  4. The aortic valve is normal in structure. Aortic valve regurgitation is not visualized. FINDINGS  Left Ventricle: Left ventricular ejection fraction, by estimation, is 70 to 75%. The left ventricle has hyperdynamic function.  The left ventricle has no regional wall motion abnormalities. The left ventricular internal cavity size was small. There is no  left ventricular hypertrophy. Left ventricular diastolic parameters were normal. Right Ventricle: The right ventricular size is normal. No increase in right ventricular wall thickness. Right ventricular systolic function is normal. Left Atrium: Left atrial size was normal in size. Right Atrium: Right atrial size was normal in size. Pericardium: There is no evidence of pericardial effusion. Mitral Valve: The mitral valve is normal in structure. Trivial mitral valve regurgitation. MV peak gradient, 2.9 mmHg. The mean mitral valve gradient is 1.0 mmHg. Tricuspid Valve: The tricuspid valve is normal in structure. Tricuspid valve regurgitation is trivial. Aortic Valve: The aortic valve is normal in structure. Aortic valve regurgitation is not visualized. Aortic valve mean gradient measures 5.0 mmHg. Aortic valve peak gradient measures 9.6  mmHg. Aortic valve area, by VTI measures 2.34 cm. Pulmonic Valve: The pulmonic valve was normal in structure. Pulmonic valve regurgitation is not visualized. Aorta: The aortic root and ascending aorta are structurally normal, with no evidence of dilitation. IAS/Shunts: No atrial level shunt detected by color flow Doppler.  LEFT VENTRICLE PLAX 2D LVIDd:         3.70 cm  Diastology LVIDs:         2.30 cm  LV e' medial:    5.44 cm/s LV PW:         0.90 cm  LV E/e' medial:  9.2 LV IVS:        0.80 cm  LV e' lateral:   7.29 cm/s LVOT diam:     2.10 cm  LV E/e' lateral: 6.9 LV SV:         46 LV SV Index:   23 LVOT Area:     3.46 cm  LEFT ATRIUM             Index LA diam:        2.30 cm 1.14 cm/m LA Vol (A2C):   26.2 ml 13.02 ml/m LA Vol (A4C):   22.0 ml 10.94 ml/m LA Biplane Vol: 24.5 ml 12.18 ml/m  AORTIC VALVE                    PULMONIC VALVE AV Area (Vmax):    2.37 cm     PV Vmax:       1.15 m/s AV Area (Vmean):   2.23 cm     PV Vmean:      80.800 cm/s AV Area (VTI):     2.34 cm     PV VTI:        0.150 m AV Vmax:           155.00 cm/s  PV Peak grad:  5.3 mmHg AV Vmean:          105.000 cm/s PV Mean grad:  3.0 mmHg AV VTI:            0.197 m AV Peak Grad:      9.6 mmHg AV Mean Grad:      5.0 mmHg LVOT Vmax:         106.00 cm/s LVOT Vmean:        67.600 cm/s LVOT VTI:          0.133 m LVOT/AV VTI ratio: 0.68  AORTA Ao Root diam: 3.50 cm MITRAL VALVE MV Area (PHT): 7.99 cm    SHUNTS MV Area VTI:   3.71 cm    Systemic VTI:  0.13 m  MV Peak grad:  2.9 mmHg    Systemic Diam: 2.10 cm MV Mean grad:  1.0 mmHg MV Vmax:       0.85 m/s MV Vmean:      57.9 cm/s MV Decel Time: 95 msec MV E velocity: 50.10 cm/s MV A velocity: 78.00 cm/s MV E/A ratio:  0.64 Serafina Royals MD Electronically signed by Serafina Royals MD Signature Date/Time: 09/12/2020/2:42:46 PM    Final      ECG - SR rate 63 BPM. (See cardiology reading for complete details)   PHYSICAL EXAM Blood pressure (!) 158/104, pulse 76, temperature 98.1 F (36.7  C), temperature source Oral, resp. rate 15, SpO2 99 %. Pleasant middle-aged African-American male not in distress. . Afebrile. Head is nontraumatic. Neck is supple without bruit.    Cardiac exam no murmur or gallop. Lungs are clear to auscultation. Distal pulses are well felt.   Neurological Exam : Awake alert oriented to time place and person.  No dysarthria or aphasia or apraxia.  Extraocular movements are full range without nystagmus.  Mild saccadic dysmetria on rightward gaze no nystagmus.  Face is symmetric without weakness.  Visual fields are full to bedside confrontational testing.  Tongue is midline.  Motor system exam shows no upper or lower extremity drift or focal weakness.  Finger-to-nose and knee to heel incoordination is present on the right and normal on the left.  Sensation is intact but some subjective tingling in the right leg.  Gait not tested.   ASSESSMENT/PLAN Malik Jones is a 60 y.o. male with history of marijuana use who presented to [facility] on 09/11/2020 for evaluation of gait disturbance and clumsiness on the right upper and lower extremities. CT head was obtained revealing an acute/subacute right superior cerebellar infarction. Pt found to have a floating thrombus in the ascending aorta. He was started on a heparin drip and tx'd to [facility] for further treatment. He did not receive IV t-PA due to late presentation (>4.5 hours from time of onset).  Stroke: acute/subacute right superior cerebellar infarction - embolic - aortic thrombus. Resultant right hemiataxia Code Stroke CT Head - [facility] CT head - not ordered MRI head - Acute/subacute large right superior cerebellar artery territory infarct. Mild mass effect on the fourth ventricle which remains patent. No hydrocephalus. No evidence of hemorrhagic transformation. Remote left superior cerebellar artery territory infarct. Mild-to-moderate chronic microvascular ischemic changes of the white matter. MRA head - not ordered CTA H&N  - Suboptimal contrast bolus timing. Focal free floating or adherent thrombus along the ascending thoracic aorta. Major cerebellar artery origins are not well evaluated due to contrast timing. Probable patent right superior cerebellar artery origin with suspected subsequent occlusion. Foci of nodular consolidation in the visualized lungs suspicious for pneumonia. Reactive mediastinal adenopathy. CT Perfusion - Perfusion imaging is likely providing incomplete information in the posterior fossa. There is 3 mL of penumbra identified partially corresponding to the area of right superior cerebellar infarction on the noncontrast head CT. Carotid Doppler - CTA neck ordered - carotid dopplers not indicated. 2D Echo - EF 70 - 75%. No cardiac source of emboli identified.  Sars Corona Virus 2 - negative LDL - 169 HgbA1c - 6.5 UDS - positive THCU VTE prophylaxis - Heparin IV Diet  Diet Order             Diet regular Room service appropriate? Yes; Fluid consistency: Thin  Diet effective now  No antithrombotic prior to admission, now on heparin IV Patient will be counseled to be compliant with his antithrombotic medications Ongoing aggressive stroke risk factor management Therapy recommendations: CLR  disposition:  Pending  Hypertension Home BP meds: none  Current BP meds: none  Stable Permissive hypertension (OK if < 220/120) but gradually normalize in 5-7 days  Long-term BP goal normotensive  Hyperlipidemia Home Lipid lowering medication: none  LDL 169, goal < 70 Current lipid lowering medication: Lipitor 40 mg daily  Continue statin at discharge  Other Stroke Risk Factors ETOH use, advised to drink no more than 1 alcoholic beverage per day. Substance abuse  Other Active Problems, Findings, Recommendations and/or Plan Code status - Full code Possible pneumonia   Hospital day # 1  Patient presented with ataxia secondary to right superior cerebral artery infarct  likely thromboembolic from ascending aorta floating thrombus.  Remains at risk for recurrent thromboembolism hence agree with IV heparin for now but will need to transition to Eliquis at discharge.  For at least 3 to 6 months and then repeat CT angio and if the clot is resolved switch to antiplatelet therapy.  Patient counseled to quit using marijuana.  Aggressive risk factor modification.  Continue ongoing therapies and likely transfer to inpatient rehab when bed available over the next few days.  Greater than 50% time during this 35-minute visit was spent in counseling and coordination of care about his embolic stroke and discussion about aortic thrombus and need for short-term anticoagulation for at least 3 to 6 months.  Stroke team will sign off.  Kindly call for questions Antony Contras, MD  To contact Stroke Continuity provider, please refer to [email or web address] After hours, contact General Neurology

## 2020-09-13 NOTE — Progress Notes (Signed)
Occupational Therapy Evaluation Patient Details Name: Malik Jones MRN: [id number] DOB: 31-Jul-1960 Today's Date: 09/13/2020    History of Present Illness Pt admitted to [facility] on 09/11/20 for evaluation of unsteadiness, R sided weakness, and L sided numbness. Speech is mildly dysarthric. 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 Pt without significant PMH.   Clinical Impression   PTA pt lived alone independently and worked at [employer]. Pt presents with significant ataxia R side and L sided sensory deficits, requiring Mod A with ADL and mobility @ RW level. Pt will most likely benefit from using ankle weight RLE to increase proprioceptive input, in addition to visual targets, to increase ability to mobilize. Feel pt is an excellent CIR candidate and very motivated to return to prior level of function. Will follow acutely.    Follow Up Recommendations  CIR    Equipment Recommendations  3 in 1 bedside commode;Tub/shower seat    Recommendations for Other Services Rehab consult     Precautions / Restrictions Precautions Precautions: Fall Precaution Comments: ataxia      Mobility Bed Mobility Overal bed mobility: Needs Assistance Bed Mobility: Supine to Sit     Supine to sit: Supervision          Transfers Overall transfer level: Needs assistance Equipment used: Rolling walker (2 wheeled) Transfers: Sit to/from Stand Sit to Stand: Mod assist         General transfer comment: Mod A without use of RW. Pt unaware of position of BLE in preparation to stand.Mod VC    Balance Overall balance assessment: Needs assistance Sitting-balance support: No upper extremity supported;Feet supported Sitting balance-Leahy Scale: Fair Sitting balance - Comments: Reaching min off BOS to donn socks with min guard assist, truncal instability noted.   Standing balance support: Bilateral upper extremity  supported Standing balance-Leahy Scale: Poor Standing balance comment: Relaint on UE support and min-modA for standing mobility.                           ADL either performed or assessed with clinical judgement   ADL Overall ADL's : Needs assistance/impaired Eating/Feeding: Set up;Sitting (feeding self with LUE as unable to feed self with R dominant hand)   Grooming: Minimal assistance;Sitting   Upper Body Bathing: Minimal assistance;Sitting   Lower Body Bathing: Moderate assistance;Sit to/from stand   Upper Body Dressing : Moderate assistance;Sitting   Lower Body Dressing: Maximal assistance;Sit to/from stand   Toilet Transfer: Moderate assistance;RW;Ambulation;Comfort height toilet;Grab bars;Cueing for safety;Cueing for sequencing   Toileting- Clothing Manipulation and Hygiene: Moderate assistance;Sit to/from stand       Functional mobility during ADLs: Moderate assistance General ADL Comments: does well with visual cues/targets to help with foot placement during mobility     Vision Baseline Vision/History: Wears glasses Wears Glasses: Reading only Patient Visual Report: Blurring of vision Vision Assessment?: Yes Eye Alignment: Within Functional Limits Ocular Range of Motion: Within Functional Limits Alignment/Gaze Preference: Within Defined Limits Tracking/Visual Pursuits: Able to track stimulus in all quads without difficulty Saccades: Within functional limits Convergence: Within functional limits Visual Fields: No apparent deficits Additional Comments: Pt states vision was "blurry" yesterday. Much improved today per pt     Perception     Praxis Praxis Praxis tested?: Within functional limits    Pertinent Vitals/Pain Pain Assessment: No/denies pain Faces Pain Scale: No hurt     Hand Dominance Right   Extremity/Trunk  Assessment Upper Extremity Assessment Upper Extremity Assessment: RUE deficits/detail;LUE deficits/detail RUE Deficits /  Details: apparent motor sensory deficits resulting in decreased gross/fine motor coordination. poor control of speed and accuracy of movement, making funciotnal use of RUE difficult. strength WFL; dropping items LUE Deficits / Details: Strength WFL; decreased sensation throughout LUE   Lower Extremity Assessment Lower Extremity Assessment: Defer to PT evaluation   Cervical / Trunk Assessment Cervical / Trunk Assessment: Other exceptions (Decreased sensation L hemibody) Cervical / Trunk Exceptions: trunkal ataxia noted   Communication Communication Communication: No difficulties   Cognition Arousal/Alertness: Awake/alert Behavior During Therapy: Flat affect Overall Cognitive Status: No family/caregiver present to determine baseline cognitive functioning                                 General Comments: A&Ox4. Will further assess   General Comments  VSS    Exercises Exercises: Other exercises Other Exercises Other Exercises: Educated pt on importance of repeating smaller controller exerciese with emphasis on smooth controlled movement patterns. Added water bottle to R side to increase proprioceptive input. Ableto complete hand to mouth pattern with increased control Pt able to return demonstrate   Shoulder Instructions      Home Living Family/patient expects to be discharged to:: Private residence Living Arrangements: Alone Available Help at Discharge: Family;Available 24 hours/day Type of Home: House Home Access: Stairs to enter [employer] of Steps: 4-5 Entrance Stairs-Rails: None Home Layout: One level     Bathroom Shower/Tub: Occupational psychologist: Standard Bathroom Accessibility: Yes How Accessible: Accessible via walker Home Equipment: None          Prior Functioning/Environment Level of Independence: Independent        Comments: Drives and works in Scientist, research (medical) at [employer].        OT Problem List: Decreased activity  tolerance;Impaired balance (sitting and/or standing);Decreased coordination;Decreased safety awareness;Decreased knowledge of use of DME or AE;Impaired sensation;Impaired UE functional use      OT Treatment/Interventions: Self-care/ADL training;Therapeutic exercise;Neuromuscular education;DME and/or AE instruction;Therapeutic activities;Patient/family education;Balance training    OT Goals(Current goals can be found in the care plan section) Acute Rehab OT Goals Patient Stated Goal: to get better OT Goal Formulation: With patient Time For Goal Achievement: 09/27/20 Potential to Achieve Goals: Good  OT Frequency: Min 2X/week   Barriers to D/C:            Co-evaluation              AM-PAC OT "6 Clicks" Daily Activity     Outcome Measure Help from another person eating meals?: A Little Help from another person taking care of personal grooming?: A Little Help from another person toileting, which includes using toliet, bedpan, or urinal?: A Lot Help from another person bathing (including washing, rinsing, drying)?: A Lot Help from another person to put on and taking off regular upper body clothing?: A Lot Help from another person to put on and taking off regular lower body clothing?: A Lot 6 Click Score: 14   End of Session Equipment Utilized During Treatment: Gait belt;Rolling walker Nurse Communication: Mobility status  Activity Tolerance: Patient tolerated treatment well Patient left: in chair;with call bell/phone within reach;with chair alarm set;with nursing/sitter in room  OT Visit Diagnosis: Unsteadiness on feet (R26.81);Other abnormalities of gait and mobility (R26.89);Ataxia, unspecified (R27.0)                Time:  [id number] OT Time Calculation (min): 29 min Charges:  OT General Charges $OT Visit: 1 Visit OT Evaluation $OT Eval Moderate Complexity: 1 Mod OT Treatments $Self Care/Home Management : 8-22 mins  Maurie Boettcher, OT/L   Acute OT Clinical  Specialist Acute Rehabilitation Services Pager [phone] Office [phone]   [facility] 09/13/2020, 3:28 PM

## 2020-09-13 NOTE — Plan of Care (Signed)
MRI completed today. Pt is working with PT and OT for mobility remains at high risk for falls. Bed alarm on. Heparin gtt is therapeutic.   Problem: Education: Goal: Knowledge of General Education information will improve Description: Including pain rating scale, medication(s)/side effects and non-pharmacologic comfort measures Outcome: Progressing   Problem: Health Behavior/Discharge Planning: Goal: Ability to manage health-related needs will improve Outcome: Progressing   Problem: Clinical Measurements: Goal: Ability to maintain clinical measurements within normal limits will improve Outcome: Progressing Goal: Will remain free from infection Outcome: Progressing Goal: Diagnostic test results will improve Outcome: Progressing Goal: Respiratory complications will improve Outcome: Progressing Goal: Cardiovascular complication will be avoided Outcome: Progressing   Problem: Activity: Goal: Risk for activity intolerance will decrease Outcome: Progressing   Problem: Nutrition: Goal: Adequate nutrition will be maintained Outcome: Progressing   Problem: Coping: Goal: Level of anxiety will decrease Outcome: Progressing   Problem: Elimination: Goal: Will not experience complications related to bowel motility Outcome: Progressing Goal: Will not experience complications related to urinary retention Outcome: Progressing   Problem: Pain Managment: Goal: General experience of comfort will improve Outcome: Progressing   Problem: Safety: Goal: Ability to remain free from injury will improve Outcome: Progressing   Problem: Skin Integrity: Goal: Risk for impaired skin integrity will decrease Outcome: Progressing   Problem: Education: Goal: Knowledge of disease or condition will improve Outcome: Progressing Goal: Knowledge of secondary prevention will improve Outcome: Progressing Goal: Knowledge of patient specific risk factors addressed and post discharge goals established  will improve Outcome: Progressing

## 2020-09-13 NOTE — Progress Notes (Signed)
Rehab Admissions Coordinator Note:  Patient was screened by Cleatrice Burke for appropriateness for an Inpatient Acute Rehab Consult per therapy recs.  At this time, we are recommending Inpatient Rehab consult. I will place order per protocol.  Cleatrice Burke RN MSN 09/13/2020, 11:47 AM  I can be reached at [phone].

## 2020-09-13 NOTE — Progress Notes (Signed)
Initial Nutrition Assessment  DOCUMENTATION CODES:   Non-severe (moderate) malnutrition in context of acute illness/injury  INTERVENTION:  -Glucerna Shake po TID, each supplement provides 220 kcal and 10 grams of protein -MVI with minerals daily -provided information on diabetes diet, will follow-up as able/appropriate   NUTRITION DIAGNOSIS:   Moderate Malnutrition related to acute illness (stroke) as evidenced by mild fat depletion, mild muscle depletion  GOAL:   Patient will meet greater than or equal to 90% of their needs  MONITOR:   PO intake, Supplement acceptance, Labs, Weight trends, I & O's  REASON FOR ASSESSMENT:   Consult Assessment of nutrition requirement/status, Diet education  ASSESSMENT:   Pt with no significant PMH admitted with 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  RD consulted as pt found to have hemoglobin A1c of 6.5, new onset DM and unaware of diagnosis. At time of RD visit, pt was still very lethargic and unable to participate in interview/exam (sleeping). Pt is inappropriate for thorough education at this time. Will provide educational materials in discharge instructions and will follow-up as able to attempt education as pt's mentation/alertness improve.   No weight history available to review. No PO intake documented.    Medications reviewed. Labs: Na 134 (L) CBGs 115 Hemoglobin A1c 6.5  UOP: 763m x24 hours I/O: +1.1L since admit  NUTRITION - FOCUSED PHYSICAL EXAM:  Flowsheet Row Most Recent Value  Orbital Region No depletion  Upper Arm Region Mild depletion  Thoracic and Lumbar Region No depletion  Buccal Region Mild depletion  Temple Region Mild depletion  Clavicle Bone Region Mild depletion  Clavicle and Acromion Bone Region No depletion  Scapular Bone Region No depletion  Dorsal Hand No depletion  Patellar Region Mild depletion  Anterior Thigh Region Moderate depletion  Posterior Calf Region Moderate depletion  Edema (RD  Assessment) None  Hair Reviewed  Eyes Unable to assess  Mouth Unable to assess  Skin Reviewed  Nails Reviewed       Diet Order:   Diet Order             Diet regular Room service appropriate? Yes; Fluid consistency: Thin  Diet effective now                   EDUCATION NEEDS:   Education needs have been addressed  Skin:  Skin Assessment: Reviewed RN Assessment  Last BM:  8/2  Height:   Ht Readings from Last 1 Encounters:  09/11/20 '6\' 3"'$  (1.905 m)    Weight:   Wt Readings from Last 1 Encounters:  09/11/20 73.9 kg    BMI:  There is no height or weight on file to calculate BMI.  Estimated Nutritional Needs:   Kcal:  2200-2400  Protein:  110-120 grams  Fluid:  >2.2L    ALarkin Ina MS, RD, LDN (she/her/hers) RD pager number and weekend/on-call pager number located in [email or web address]

## 2020-09-14 DIAGNOSIS — I639 Cerebral infarction, unspecified: Secondary | ICD-10-CM

## 2020-09-14 LAB — BASIC METABOLIC PANEL
Anion gap: 8 (ref 5–15)
BUN: 13 mg/dL (ref 6–20)
CO2: 22 mmol/L (ref 22–32)
Calcium: 9.3 mg/dL (ref 8.9–10.3)
Chloride: 106 mmol/L (ref 98–111)
Creatinine, Ser: 0.93 mg/dL (ref 0.61–1.24)
GFR, Estimated: >60 mL/min (ref >60)
Glucose, Bld: 109 mg/dL — ABNORMAL HIGH (ref 70–99)
Potassium: 4.1 mmol/L (ref 3.5–5.1)
Sodium: 136 mmol/L (ref 135–145)

## 2020-09-14 LAB — CBC
HCT: 46.4 % (ref 39.0–52.0)
Hemoglobin: 14.9 g/dL (ref 13.0–17.0)
MCH: 30.8 pg (ref 26.0–34.0)
MCHC: 32.1 g/dL (ref 30.0–36.0)
MCV: 96.1 fL (ref 80.0–100.0)
Platelets: 327 10*3/uL (ref 150–400)
RBC: 4.83 MIL/uL (ref 4.22–5.81)
RDW: 13.2 % (ref 11.5–15.5)
WBC: 5.7 10*3/uL (ref 4.0–10.5)
nRBC: 0 % (ref 0.0–0.2)

## 2020-09-14 LAB — MAGNESIUM: Magnesium: 2.1 mg/dL (ref 1.7–2.4)

## 2020-09-14 LAB — PHOSPHORUS: Phosphorus: 3.7 mg/dL (ref 2.5–4.6)

## 2020-09-14 LAB — HEPARIN LEVEL (UNFRACTIONATED): Heparin Unfractionated: 0.34 IU/mL (ref 0.30–0.70)

## 2020-09-14 MED ORDER — AMLODIPINE BESYLATE 2.5 MG PO TABS
2.5000 mg | ORAL_TABLET | Freq: Every day | ORAL | Status: DC
  Administered 2020-09-14 – 2020-09-16 (×3): 2.5 mg via ORAL
  Filled 2020-09-14 (×3): qty 1

## 2020-09-14 NOTE — Progress Notes (Signed)
Occupational Therapy Treatment Patient Details Name: Malik Jones MRN: [id number] DOB: 12-20-1960 Today's Date: 09/14/2020    History of present illness Pt admitted to [facility] on 09/11/20 for evaluation of unsteadiness, R sided weakness, and L sided numbness. Speech is mildly dysarthric. 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 Pt without significant PMH.   OT comments  Pt continues with excellent participation and is a great candidate for post acute CIR level of rehab. Focus of session on sequencing reach-grasp-release activities and targeting items on tray both in and out of base of support. Goal of activities to focus on improved coordination in prep for Indep and ease with ADL completion. Tolerated transfer training and brief bout of mobility with up to mod A for correction of balance, improved with verbal cuing through task completion. OT will continue to follow acutely, with post acute recommendations remaining for CIR.    Follow Up Recommendations  CIR    Equipment Recommendations  3 in 1 bedside commode;Tub/shower seat    Recommendations for Other Services Rehab consult    Precautions / Restrictions Precautions Precautions: Fall Precaution Comments: ataxia Restrictions Weight Bearing Restrictions: No       Mobility Bed Mobility Overal bed mobility: Needs Assistance Bed Mobility: Supine to Sit     Supine to sit: Supervision     General bed mobility comments: supervision for safety    Transfers Overall transfer level: Needs assistance Equipment used: Rolling walker (2 wheeled) Transfers: Sit to/from Stand Sit to Stand: Min guard         General transfer comment: min guard consistently with cues for hand placement and posture with use of RW for support.    Balance Overall balance assessment: Needs assistance Sitting-balance support: No upper extremity supported;Feet  supported Sitting balance-Leahy Scale: Fair     Standing balance support: Bilateral upper extremity supported Standing balance-Leahy Scale: Poor Standing balance comment: reliant on UE support and external assist                           ADL either performed or assessed with clinical judgement   ADL                           Toilet Transfer: Moderate assistance;RW           Functional mobility during ADLs: Moderate assistance General ADL Comments: improved performance with mod cues including sequencing and visual reminders/sustained attention     Vision       Perception     Praxis      Cognition Arousal/Alertness: Awake/alert Behavior During Therapy: Flat affect Overall Cognitive Status: No family/caregiver present to determine baseline cognitive functioning                                 General Comments: Fairly flat affect, follow multi step commands, Ox4,        Exercises     Shoulder Instructions       General Comments      Pertinent Vitals/ Pain       Pain Assessment: No/denies pain  Home Living  Prior Functioning/Environment              Frequency  Min 2X/week        Progress Toward Goals  OT Goals(current goals can now be found in the care plan section)  Progress towards OT goals: Progressing toward goals  Acute Rehab OT Goals Patient Stated Goal: to do more for myself OT Goal Formulation: With patient  Plan Discharge plan remains appropriate    Co-evaluation                 AM-PAC OT "6 Clicks" Daily Activity     Outcome Measure   Help from another person eating meals?: A Little Help from another person taking care of personal grooming?: A Little Help from another person toileting, which includes using toliet, bedpan, or urinal?: A Lot Help from another person bathing (including washing, rinsing, drying)?: A Lot Help  from another person to put on and taking off regular upper body clothing?: A Lot Help from another person to put on and taking off regular lower body clothing?: A Lot 6 Click Score: 14    End of Session Equipment Utilized During Treatment: Gait belt;Rolling walker  OT Visit Diagnosis: Unsteadiness on feet (R26.81);Other abnormalities of gait and mobility (R26.89);Ataxia, unspecified (R27.0)   Activity Tolerance Patient tolerated treatment well   Patient Left in chair;with call bell/phone within reach;with chair alarm set;with nursing/sitter in room   Nurse Communication Mobility status        Time: [id number] OT Time Calculation (min): 28 min  Charges: OT General Charges $OT Visit: 1 Visit OT Treatments $Therapeutic Activity: 23-37 mins  Wynton Hufstetler OTR/L acute rehab services Office: [phone]   09/14/2020, 2:47 PM

## 2020-09-14 NOTE — Progress Notes (Signed)
Patient ID: Mal[facility] Jones, male   DOB: [birth year], 60 y.o.   MRN: [id number]  PROGRESS NOTE    Malik Jones  [id number] DOB: January 05, 1961 DOA: 09/12/2020 PCP: [facility]    Brief Narrative:  Malik Jones is a 60 y.o. male with no significant past medical history presented to [facility] emergency room at [facility] with dizziness and falls with some discoordination on Malik right side and numbness on [facility] left.  He was then brought into [facility] emergency department and was noted to have acute cerebellar infarct.  CTA of [facility] head and neck was found to have free-floating) thrombus along [facility]ik ascending thoracic aorta.  Vascular surgery was consulted who recommended transfer to higher center.  Neurology saw [facility] patient in [facility] who ordered MRI of [facility] brain and echocardiogram.  Patient was then started on heparin drip and transferred to [facility] for further evaluation and treatment.   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 On heparin drip.  Vascular surgery and neurology was consulted at [facility] who recommended admission to [facility] for further evaluation.  Lipid panel with total cholesterol of 241 and LDL cholesterol of 169. 2D echocardiogram at [facility] showed LV ejection fraction of 70 to 75%, no cardiac source of emboli.  MRI of [facility] brain showed large right superior cerebellar artery territory infarct with mild mass-effect on Malik fourth ventricle without hydrocephalus, remote left superior cerebellar artery territory infarct and chronic microvascular ischemic changes.  Stroke team has signed off Continue Lipitor.  Anticoagulation for Malik next 3 to 6 months (will likely be Eliquis pharmacy to check co-pay costs on Monday)    Falls, dizziness secondary to cerebellar stroke.  Physical therapy has seen [facility] patient and recommended CIR.     Hyperlipidemia.  Continue statins.   Hypertension Permissive hypertension allowed and pressures are in Malik 160s over 100 range Will begin low-dose Norvasc today  New onset diabetes likely.  He is unaware.  Hemoglobin A1c of 6.5.  Will need lifestyle modification.  Might benefit from metformin prior to discharge.  We will get nutrition evaluation.   DVT prophylaxis: On: Heparin GTT Code Status: Full code  Family Communication: Patient at bedside Disposition Plan: CIR Patient remains inpatient due to ongoing medical work-up and unsafe discharge planning.   Consultants:  Neurology  Procedures: None  Antimicrobials: Anti-infectives (From admission, onward)    None        Subjective: Patient feels well today he is anxious to get home if possible.  He notes some fatigue with walking  Objective: Vitals:   09/14/20 0425 09/14/20 0755 09/14/20 1156 09/14/20 1515  BP: (!) 161/97 (!) 160/106 (!) 160/98 (!) 153/83  Pulse: 73 69 69 71  Resp: '15 14 18 16  '$ Temp: 98 F (36.7 C) 98.6 F (37 C) 98.2 F (36.8 C) 98.8 F (37.1 C)  TempSrc: Oral Oral Oral Oral  SpO2: 100% 99% 100% 99%    Intake/Output Summary (Last 24 hours) at 09/14/2020 1523 Last data filed at 09/13/2020 2021 Gross per 24 hour  Intake --  Output 600 ml  Net -600 ml    Examination:  General exam: Appears calm and comfortable  Respiratory system: Clear to auscultation. Respiratory effort normal. Cardiovascular system: S1 & S2 heard, RRR.  Gastrointestinal  system: Abdomen is nondistended, soft and nontender.  Central nervous system: Alert and oriented. No focal neurological deficits. Extremities: Symmetric  Skin: No rashes Psychiatry: Judgement and insight appear normal. Mood & affect appropriate.   Data Reviewed: I have personally reviewed following labs and imaging studies  CBC: Recent  Labs  Lab 09/11/20 0939 09/12/20 0930 09/13/20 0146 09/14/20 0247  WBC 6.5 8.4 5.9 5.7  NEUTROABS  --  6.5  --   --   HGB 14.7 16.3 15.3 14.9  HCT 43.3 48.2 46.2 46.4  MCV 94.3 93.6 94.5 96.1  PLT 335 373 398 [id number]   Basic Metabolic Panel: Recent Labs  Lab 09/11/20 0939 09/12/20 0930 09/13/20 0146 09/14/20 0247  NA 133* 136 134* 136  K 3.8 3.8 3.6 4.1  CL 96* 100 101 106  CO2 '22 24 23 22  '$ GLUCOSE 186* 139* 118* 109*  BUN '12 14 14 13  '$ CREATININE 0.90 0.86 0.99 0.93  CALCIUM 9.7 10.0 9.5 9.3  MG  --   --  2.4 2.1  PHOS  --   --   --  3.7   GFR: Estimated Creatinine Clearance: 90.5 mL/min (by C-G formula based on SCr of 0.93 mg/dL).  Coagulation Profile: Recent Labs  Lab 09/11/20 0939  INR 1.0    HbA1C: Recent Labs    09/12/20 0930  HGBA1C 6.5*   CBG: Recent Labs  Lab 09/12/20 0732  GLUCAP 115*   Lipid Profile: Recent Labs    09/12/20 0930  CHOL 251*  HDL 60  LDLCALC 169*  TRIG 111  CHOLHDL 4.2    Sepsis Labs: Recent Results (from [facility] past 240 hour(s))  Resp Panel by RT-PCR (Flu A&B, Covid) Nasopharyngeal Swab     Status: None   Collection Time: 09/11/20 12:08 PM   Specimen: Nasopharyngeal Swab; Nasopharyngeal(NP) swabs in vial transport medium  Result Value Ref Range Status   SARS Coronavirus 2 by RT PCR NEGATIVE NEGATIVE Final    Comment: (NOTE) SARS-CoV-2 target nucleic acids are NOT DETECTED.  [facility] SARS-CoV-2 RNA is generally detectable in upper respiratory specimens during Malik acute phase of infection. Malik lowest concentration of SARS-CoV-2 viral copies this assay can detect is 138 copies/mL. A negative result does not preclude SARS-Cov-2 infection and should not be used as Malik sole basis for treatment or other patient management decisions. A negative result may occur with  improper specimen collection/handling, submission of specimen other than nasopharyngeal swab, presence of viral mutation(s) within Malik areas targeted by this assay,  and inadequate number of viral copies(<138 copies/mL). A negative result must be combined with clinical observations, patient history, and epidemiological information. Malik expected result is Negative.  Fact Sheet for Patients:  EntrepreneurPulse.com.au  Fact Sheet for Healthcare Providers:  IncredibleEmployment.be  This test is no t yet approved or cleared by [place] FDA and  has been authorized for detection and/or diagnosis of SARS-CoV-2 by FDA under an Emergency Use Authorization (EUA). This EUA will remain  in effect (meaning this test can be used) for Malik duration of Malik COVID-19 declaration under Section 564(b)(1) of [place] Act, 21 U.S.C.section 360bbb-3(b)(1), unless Malik authorization is terminated  or revoked sooner.       Influenza A by PCR NEGATIVE NEGATIVE Final   Influenza B by PCR NEGATIVE NEGATIVE Final    Comment: (NOTE) [facility] Xpert Xpress SARS-CoV-2/FLU/RSV plus assay is intended as an aid in [facility] diagnosis of influenza from Nasopharyngeal swab specimens and should not be used as a sole basis  for treatment. Nasal washings and aspirates are unacceptable for Xpert Xpress SARS-CoV-2/FLU/RSV testing.  Fact Sheet for Patients: EntrepreneurPulse.com.au  Fact Sheet for Healthcare Providers: IncredibleEmployment.be  This test is not yet approved or cleared by [place] FDA and has been authorized for detection and/or diagnosis of SARS-CoV-2 by FDA under an Emergency Use Authorization (EUA). This EUA will remain in effect (meaning this test can be used) for Mal[facility] duration of Malik COVID-19 declaration under Section 564(b)(1) of [place] Act, 21 U.S.C. section 360bbb-3(b)(1), unless [place] authorization is terminated or revoked.  Performed at [facility], [address], [place], [place] [address]       Radiology Studies: MR BRAIN WO CONTRAST  Result Date: 09/13/2020 CLINICAL  DATA:  Stroke suspected. EXAM: MRI HEAD WITHOUT CONTRAST TECHNIQUE: Multiplanar, multiecho pulse sequences of Malik brain and surrounding structures were obtained without intravenous contrast. COMPARISON:  Head CT September 11, 2020. FINDINGS: Brain: Area of restricted diffusion involving Malik superior right cerebellar hemisphere extending to Malik superior cerebellar peduncle, consistent with right superior cerebellar artery territory infarct. There is mild mass effect with effacement of Malik fourth ventricle which remains patent. No hydrocephalus. Remote infarct in Malik left cerebellar hemisphere. Scattered foci of T2 hyperintensity are seen within Malik white matter of Malik cerebral hemispheres, nonspecific. Vascular: Normal flow voids. Skull and upper cervical spine: Normal marrow signal. Sinuses/Orbits: Opacification of Malik right maxillary sinus. Mucosal thickening of Malik left maxillary sinus and bilateral ethmoid cells. Malik orbits are maintained. Other: None. IMPRESSION: 1. 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 Mild mass effect on Malik fourth ventricle which remains patent. No hydrocephalus. No evidence of hemorrhagic transformation. 2. Remote left superior cerebellar artery territory infarct. 3. Mild-to-moderate chronic microvascular ischemic changes of Malik white matter. Electronically Signed   By: Pedro Earls M.D.   On: 09/13/2020 13:15     Scheduled Meds:  atorvastatin  40 mg Oral Daily   feeding supplement (GLUCERNA SHAKE)  237 mL Oral TID BM   multivitamin with minerals  1 tablet Oral Daily   Continuous Infusions:  sodium chloride 100 mL/hr at 09/14/20 0637   heparin 1,300 Units/hr (09/14/20 0350)     LOS: 2 days    Donnamae Jude, MD 09/14/2020 3:23 PM [phone] [facility] If 7PM-7AM, please contact night-coverage 09/14/2020, 3:23 PM

## 2020-09-14 NOTE — Consult Note (Signed)
[facility] for  IV Heparin Indication: stroke and aortic thrombus  No Known Allergies  Patient Measurements: Total Body Weight: 73.9 kg Height: 75 inches Heparin Dosing Weight: 73.9 kg  Vital Signs: Temp: 98.6 F (37 C) (08/06 0755) Temp Source: Oral (08/06 0755) BP: 160/106 (08/06 0755) Pulse Rate: 69 (08/06 0755)  Labs: Recent Labs    09/11/20 1324 09/11/20 1452 09/11/20 2238 09/12/20 0930 09/12/20 1826 09/13/20 0146 09/13/20 0949 09/13/20 2004 09/14/20 0247  HGB  --   --    < > 16.3  --  15.3  --   --  14.9  HCT  --   --   --  48.2  --  46.2  --   --  46.4  PLT  --   --   --  373  --  398  --   --  327  APTT  --  26  --   --   --   --   --   --   --   HEPARINUNFRC  --   --    < >  --    < > 0.68 0.48 0.33 0.34  CREATININE  --   --   --  0.86  --  0.99  --   --  0.93  TROPONINIHS 52*  --   --   --   --   --   --   --   --    < > = values in this interval not displayed.   Estimated Creatinine Clearance: 90.5 mL/min (by C-G formula based on SCr of 0.93 mg/dL).  Medical History: Past Medical History:  Diagnosis Date   Medical history non-contributory     Assessment: 60 yr old man presented to [facility] ED on 09/11/20 with L-sided weakness; head CT showed R nonhemorrhagic cerebellar stroke (pt out of window for tPA) and thrombus along ascending thoracic aorta (likely the etiology of embolic infarction). Pt was started on heparin infusion at [facility]. He was transferred to [facility] for further evaluation. Pt was on no anticoagulants PTA to [facility].  Pt continues on heparin at 1300 units/hr.  Heparin level remains therapeutic.  No s/sx of bleeding or infusion issues. CBC stable.   Goal of Therapy:  Heparin level: 0.3-0.5 units/ml (stroke protocol, no boluses due to recent stroke) Monitor platelets by anticoagulation protocol: Yes   Plan:  Continue heparin infusion at 1300 units/hr Monitor daily heparin level, CBC Monitor for  bleeding  Noted Stroke team recommendations for eventual transition to Eliquis for 3-6 months. Will ask for copay check on Monday 8/8.  [employer], Pharm.D., BCPS Clinical Pharmacist  **Pharmacist phone directory can be found on [email or web address] listed under [place].  09/14/2020 9:54 AM

## 2020-09-14 NOTE — Progress Notes (Signed)
Physical Therapy Treatment Patient Details Name: Malik Jones MRN: [id number] DOB: November 21, 1960 Today's Date: 09/14/2020    History of Present Illness Pt admitted to [facility] on 09/11/20 for evaluation of unsteadiness, R sided weakness, and L sided numbness. Speech is mildly dysarthric. 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 Pt without significant PMH.    PT Comments    Patient progressing towards physical therapy goals. Patient continues to be limited by ataxia on R, impaired sensation on L, and weakness. Patient had LOB x 4-5 throughout ambulation requiring modA to recover. Patient required frequent standing rest breaks due increasing incoordination and needing to "reset". Patient with increased difficulty with turns requiring cues for smaller steps and slowed pace during turns. Patient seems motivated but had flat affect throughout. Continue to recommend comprehensive inpatient rehab (CIR) for post-acute therapy needs.     Follow Up Recommendations  CIR;Supervision/Assistance - 24 hour     Equipment Recommendations  Rolling Verneda Hollopeter with 5" wheels;3in1 (PT)    Recommendations for Other Services       Precautions / Restrictions Precautions Precautions: Fall Precaution Comments: ataxia Restrictions Weight Bearing Restrictions: No    Mobility  Bed Mobility Overal bed mobility: Needs Assistance Bed Mobility: Supine to Sit     Supine to sit: Supervision     General bed mobility comments: supervision for safety    Transfers Overall transfer level: Needs assistance Equipment used: Rolling Keilana Morlock (2 wheeled) Transfers: Sit to/from Stand Sit to Stand: Min guard         General transfer comment: min guard for safety from low bed surface. Able to self steady upon standing  Ambulation/Gait Ambulation/Gait assistance: Min assist;Mod assist Gait Distance (Feet): 150 Feet Assistive  device: Rolling Kamryn Messineo (2 wheeled) Gait Pattern/deviations: Step-through pattern;Decreased stride length;Ataxic;Trunk flexed Gait velocity: decreased   General Gait Details: LOB x 4-5 during ambulation (more with fatigue) with modA to recover. MinA for balance overall. Cues for stopping and restarting when patient becomes increasingly unsteady rather than pushing self which increases fall risk. Standing rest break x 5 throughout ambulation. HR max 110. Patient with forceful placement of R LE at times, cues for control and soft placement.   Stairs             Wheelchair Mobility    Modified Rankin (Stroke Patients Only) Modified Rankin (Stroke Patients Only) Pre-Morbid Rankin Score: No symptoms Modified Rankin: Moderately severe disability     Balance Overall balance assessment: Needs assistance Sitting-balance support: No upper extremity supported;Feet supported Sitting balance-Leahy Scale: Fair     Standing balance support: Bilateral upper extremity supported Standing balance-Leahy Scale: Poor Standing balance comment: reliant on UE support and external assist                            Cognition Arousal/Alertness: Awake/alert Behavior During Therapy: Flat affect Overall Cognitive Status: No family/caregiver present to determine baseline cognitive functioning                                 General Comments: A&Ox4. Flat affect but follows all commands appropriately      Exercises      General Comments        Pertinent Vitals/Pain Pain Assessment: No/denies pain    Home Living  Prior Function            PT Goals (current goals can now be found in the care plan section) Acute Rehab PT Goals Patient Stated Goal: to get better PT Goal Formulation: With patient/family Time For Goal Achievement: 09/27/20 Potential to Achieve Goals: Good Progress towards PT goals: Progressing toward goals     Frequency    Min 4X/week      PT Plan Current plan remains appropriate    Co-evaluation              AM-PAC PT "6 Clicks" Mobility   Outcome Measure  Help needed turning from your back to your side while in a flat bed without using bedrails?: A Little Help needed moving from lying on your back to sitting on the side of a flat bed without using bedrails?: A Little Help needed moving to and from a bed to a chair (including a wheelchair)?: A Lot Help needed standing up from a chair using your arms (e.g., wheelchair or bedside chair)?: A Little Help needed to walk in hospital room?: A Lot Help needed climbing 3-5 steps with a railing? : Total 6 Click Score: 14    End of Session Equipment Utilized During Treatment: Gait belt Activity Tolerance: Patient tolerated treatment well Patient left: in chair;with call bell/phone within reach;with chair alarm set Nurse Communication: Mobility status PT Visit Diagnosis: Unsteadiness on feet (R26.81);Other abnormalities of gait and mobility (R26.89);Ataxic gait (R26.0);Difficulty in walking, not elsewhere classified (R26.2);Other symptoms and signs involving the nervous system (R29.898)     Time: [id number] PT Time Calculation (min) (ACUTE ONLY): 43 min  Charges:  $Gait Training: 38-52 mins                     Josten Warmuth A. Gilford Rile PT, DPT Acute Rehabilitation Services Pager [phone] Office [phone]    Linna Hoff 09/14/2020, 1:06 PM

## 2020-09-15 LAB — CBC
HCT: 40.5 % (ref 39.0–52.0)
Hemoglobin: 13.5 g/dL (ref 13.0–17.0)
MCH: 31.8 pg (ref 26.0–34.0)
MCHC: 33.3 g/dL (ref 30.0–36.0)
MCV: 95.3 fL (ref 80.0–100.0)
Platelets: 281 10*3/uL (ref 150–400)
RBC: 4.25 MIL/uL (ref 4.22–5.81)
RDW: 13 % (ref 11.5–15.5)
WBC: 4.5 10*3/uL (ref 4.0–10.5)
nRBC: 0 % (ref 0.0–0.2)

## 2020-09-15 LAB — HEPARIN LEVEL (UNFRACTIONATED): Heparin Unfractionated: 0.44 IU/mL (ref 0.30–0.70)

## 2020-09-15 NOTE — Progress Notes (Signed)
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 He does not have the subscriber ID but states that he will try to find it and get it to me. Family is interested in [place] for Pt. And Theodis Aguas is working on pulling together a plan for 24/7 support at discharge.   Clemens Catholic, [place], [place] Admissions Coordinator  [phone] ([place]) [phone] (office)

## 2020-09-15 NOTE — Consult Note (Signed)
[facility] OF [place]  ASSESSMENT / PLAN: 60 y.o. 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 No evidence of significant disease in the carotid or vertebral arteries. Recommend continued anticoagulation with heparin, and non-urgent cardiac surgery evaluation. Please call for questions.   CHIEF COMPLAINT: cerebellar stroke  HISTORY OF PRESENT ILLNESS: Malik Jones is a 60 y.o. male transferred to [facility] from [facility] regional for higher level of care after a cerebellar stroke.  Patient was brought to the emergency room at [facility] 09/12/2020.  With dizziness, falls, discoordination.  CT angiogram of the head neck performed 09/13/2020 demonstrated ascending aortic thrombus.  Vascular surgery at [facility] recommended transfer to higher level of care.  He was initiated on a heparin drip and transferred to our facility for further care.  On my evaluation, the patient reports good progress since admission. He is slowly regaining neurologic function. He reports no prior history of stroke or clotting.  Past Medical History:  Diagnosis Date   Medical history non-contributory     Past Surgical History:  Procedure Laterality Date   NO PAST SURGERIES      Family History  Problem Relation Age of Onset   Kidney disease Mother    Hip fracture Father     Social History   Socioeconomic History   Marital status: Single    Spouse name: Not on file   Number of children: Not on file   Years of education: Not on file   Highest education level: Not on file  Occupational History   Not on file  Tobacco Use   Smoking status: Never   Smokeless tobacco: Never  Substance and Sexual Activity   Alcohol use: Yes   Drug use: Yes    Types: Marijuana   Sexual activity: Not on file  Other Topics Concern   Not on file  Social History Narrative   Not on file   Social Determinants of Health   Financial Resource Strain: Not on  file  Food Insecurity: Not on file  Transportation Needs: Not on file  Physical Activity: Not on file  Stress: Not on file  Social Connections: Not on file  Intimate Partner Violence: Not on file    No Known Allergies  Current Facility-Administered Medications  Medication Dose Route Frequency Provider Last Rate Last Admin   0.9 %  sodium chloride infusion   Intravenous Continuous Pokhrel, Laxman, MD 100 mL/hr at 09/15/20 0828 New Bag at 09/15/20 [id number]   acetaminophen (TYLENOL) tablet 650 mg  650 mg Oral Q4H PRN Pokhrel, Laxman, MD       Or   acetaminophen (TYLENOL) 160 MG/5ML solution 650 mg  650 mg Per Tube Q4H PRN Pokhrel, Laxman, MD       Or   acetaminophen (TYLENOL) suppository 650 mg  650 mg Rectal Q4H PRN Pokhrel, Laxman, MD       amLODipine (NORVASC) tablet 2.5 mg  2.5 mg Oral Daily Donnamae Jude, MD   2.5 mg at 09/15/20 1031   atorvastatin (LIPITOR) tablet 40 mg  40 mg Oral Daily Pokhrel, Laxman, MD   40 mg at 09/15/20 1031   feeding supplement (GLUCERNA SHAKE) (GLUCERNA SHAKE) liquid 237 mL  237 mL Oral TID BM Pokhrel, Laxman, MD   237 mL at 09/15/20 1031   heparin ADULT infusion 100 units/mL (25000 units/230m)  1,300 Units/hr Intravenous Continuous BLaren Everts RPH 13 mL/hr at 09/15/20 0022 1,300 Units/hr at 09/15/20 0022  multivitamin with minerals tablet 1 tablet  1 tablet Oral Daily Pokhrel, Laxman, MD   1 tablet at 09/15/20 1031   senna-docusate (Senokot-S) tablet 1 tablet  1 tablet Oral QHS PRN Pokhrel, Laxman, MD        REVIEW OF SYSTEMS:  '[X]'$  denotes positive finding, '[ ]'$  denotes negative finding Cardiac  Comments:  Chest pain or chest pressure:    Shortness of breath upon exertion:    Short of breath when lying flat:    Irregular heart rhythm:        Vascular    Pain in calf, thigh, or hip brought on by ambulation:    Pain in feet at night that wakes you up from your sleep:     Blood clot in your veins:    Leg swelling:         Pulmonary    Oxygen at  home:    Productive cough:     Wheezing:         Neurologic    Sudden weakness in arms or legs:     Sudden numbness in arms or legs:     Sudden onset of difficulty speaking or slurred speech:    Temporary loss of vision in one eye:     Problems with dizziness:         Gastrointestinal    Blood in stool:     Vomited blood:         Genitourinary    Burning when urinating:     Blood in urine:        Psychiatric    Major depression:         Hematologic    Bleeding problems:    Problems with blood clotting too easily:        Skin    Rashes or ulcers:        Constitutional    Fever or chills:      PHYSICAL EXAM Vitals:   09/14/20 2350 09/15/20 0739 09/15/20 1031 09/15/20 1201  BP: (!) 177/96 (!) 168/90 (!) 178/99 (!) 151/88  Pulse: 64 (!) 57  68  Resp: '16 10  14  '$ Temp: 98.2 F (36.8 C) 98.8 F (37.1 C)  97.9 F (36.6 C)  TempSrc: Oral Oral  Oral  SpO2: 98% 100%  100%    Constitutional: well appearing. no distress. Appears well nourished.  Neurologic: CN intact. Some hesitancy with speech. No gross extremity weakness. Some mild discoordination. Psychiatric: Mood and affect symmetric and appropriate. Eyes: No icterus. No conjunctival pallor. Ears, nose, throat: mucous membranes moist. Midline trachea.  Cardiac: regular rate and rhythm.  Respiratory:  unlabored. Abdominal:  soft, non-tender, non-distended.  Peripheral vascular: 2+ radial pulses. 2+ PT  Extremity: no edema. no cyanosis. no pallor.  Skin: no gangrene. no ulceration.  Lymphatic: no Stemmer's sign. no palpable lymphadenopathy.  PERTINENT LABORATORY AND RADIOLOGIC DATA  Most recent CBC CBC Latest Ref Rng & Units 09/15/2020 09/14/2020 09/13/2020  WBC 4.0 - 10.5 K/uL 4.5 5.7 5.9  Hemoglobin 13.0 - 17.0 g/dL 13.5 14.9 15.3  Hematocrit 39.0 - 52.0 % 40.5 46.4 46.2  Platelets 150 - 400 K/uL 281 327 398     Most recent CMP CMP Latest Ref Rng & Units 09/14/2020 09/13/2020 09/12/2020  Glucose 70 - 99 mg/dL  109(H) 118(H) 139(H)  BUN 6 - 20 mg/dL '13 14 14  '$ Creatinine 0.61 - 1.24 mg/dL 0.93 0.99 0.86  Sodium 135 - 145 mmol/L 136 134(L) 136  Potassium 3.5 -  5.1 mmol/L 4.1 3.6 3.8  Chloride 98 - 111 mmol/L 106 101 100  CO2 22 - 32 mmol/L '22 23 24  '$ Calcium 8.9 - 10.3 mg/dL 9.3 9.5 10.0    Renal function Estimated Creatinine Clearance: 90.5 mL/min (by C-G formula based on SCr of 0.93 mg/dL).  Hgb A1c MFr Bld (%)  Date Value  09/12/2020 6.5 (H)    LDL Cholesterol  Date Value Ref Range Status  09/12/2020 169 (H) 0 - 99 mg/dL Final    Comment:           Total Cholesterol/HDL:CHD Risk Coronary Heart Disease Risk Table                     Men   Women  1/2 Average Risk   3.4   3.3  Average Risk       5.0   4.4  2 X Average Risk   9.6   7.1  3 X Average Risk  23.4   11.0        Use the calculated Patient Ratio above and the CHD Risk Table to determine the patient's CHD Risk.        ATP III CLASSIFICATION (LDL):  <100     mg/dL   Optimal  100-129  mg/dL   Near or Above                    Optimal  130-159  mg/dL   Borderline  160-189  mg/dL   High  >190     mg/dL   Very High Performed at [facility], [address] [place], [place] [address]      CLINICAL DATA:  Stroke suspected.   EXAM: MRI HEAD WITHOUT CONTRAST   TECHNIQUE: Multiplanar, multiecho pulse sequences of the brain and surrounding structures were obtained without intravenous contrast.   COMPARISON:  Head CT September 11, 2020.   FINDINGS: Brain: Area of restricted diffusion involving the superior right cerebellar hemisphere extending to the superior cerebellar peduncle, consistent with right superior cerebellar artery territory infarct. There is mild mass effect with effacement of the fourth ventricle which remains patent. No hydrocephalus. Remote infarct in the left cerebellar hemisphere. Scattered foci of T2 hyperintensity are seen within the white matter of the cerebral hemispheres, nonspecific.    Vascular: Normal flow voids.   Skull and upper cervical spine: Normal marrow signal.   Sinuses/Orbits: Opacification of the right maxillary sinus. Mucosal thickening of the left maxillary sinus and bilateral ethmoid cells. The orbits are maintained.   Other: None.   IMPRESSION: 1. 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 Mild mass effect on the fourth ventricle which remains patent. No hydrocephalus. No evidence of hemorrhagic transformation. 2. Remote left superior cerebellar artery territory infarct. 3. Mild-to-moderate chronic microvascular ischemic changes of the white matter.     Electronically Signed   By: Pedro Earls M.D.   On: 09/13/2020 13:15   CLINICAL DATA:  Stroke, follow-up   EXAM: CT ANGIOGRAPHY HEAD AND NECK   CT PERFUSION BRAIN   TECHNIQUE: Multidetector CT imaging of the head and neck was performed using the standard protocol during bolus administration of intravenous contrast. Multiplanar CT image reconstructions and MIPs were obtained to evaluate the vascular anatomy. Carotid stenosis measurements (when applicable) are obtained utilizing NASCET criteria, using the distal internal carotid diameter as the denominator.   Multiphase CT imaging of the brain was performed following IV bolus contrast injection. Subsequent parametric  perfusion maps were calculated using RAPID software.   CONTRAST:  159m OMNIPAQUE IOHEXOL 350 MG/ML SOLN   COMPARISON:  None.   FINDINGS: CTA NECK   Aortic arch: There is focal free-floating or adherent thrombus along the ascending thoracic aorta. Minor calcified plaque along the arch. Great vessel origins are patent with poor visualization of the left common carotid origin due to streak artifact from adjacent venous contrast.   Right carotid system: Post patent.  No stenosis.   Left carotid system: Patent.  No stenosis.   Vertebral arteries: Patent. Suboptimal  evaluation due to adjacent venous contrast. Left vertebral is dominant. No stenosis identified.   Skeleton: No significant abnormality.   Other neck: Unremarkable.   Upper chest: Enlarged 1.2 cm right paratracheal node. Nonenlarged 9 mm prevascular node. Foci of nodular consolidation in the visualized lungs.   Review of the MIP images confirms the above findings   CTA HEAD   Suboptimal contrast bolus.   Anterior circulation: Intracranial internal carotid arteries are patent with trace calcified plaque. Anterior cerebral arteries are patent. Right A1 ACA is dominant. Middle cerebral arteries are patent.   Posterior circulation: Intracranial vertebral arteries are patent. Basilar artery is patent. Major cerebellar artery origins are not well evaluated due to contrast timing. Right superior cerebellar artery origin is patent with suspected subsequent occlusion. Posterior cerebral arteries are patent.   Venous sinuses: Patent as allowed by contrast bolus timing.   Review of the MIP images confirms the above findings   CT Brain Perfusion Findings:   CBF (<30%) Volume: 059m  Perfusion (Tmax>6.0s) volume: 69m75m Mismatch Volume: 69mL36mInfarction Location:Area of territory at risk partially corresponds to right superior cerebellar infarction on noncontrast head CT.   IMPRESSION: Suboptimal contrast bolus timing.   Focal free floating or adherent thrombus along the ascending thoracic aorta.   Major cerebellar artery origins are not well evaluated due to contrast timing. Probable patent right superior cerebellar artery origin with suspected subsequent occlusion.   Perfusion imaging is likely providing incomplete information in the posterior fossa. There is 3 mL of penumbra identified partially corresponding to the area of right superior cerebellar infarction on the noncontrast head CT.   Foci of nodular consolidation in the visualized lungs suspicious for pneumonia.  Reactive mediastinal adenopathy.   Initial results were discussed by telephone at the time of interpretation on 09/11/2020 at 1:10 pm to provider [facility] verbally acknowledged these results.     Electronically Signed   By: PranMacy Mis.   On: 09/11/2020 13:23  ThomYevonne AlinewkStanford Breed [facility] Number: [phone]/2022 1:01 PM

## 2020-09-15 NOTE — Progress Notes (Signed)
PROGRESS NOTE    Malik Jones  [id number] DOB: [birth year] DOA: 09/12/2020 PCP: Ellene Route    No chief complaint on file.   Brief Narrative:  Malik Jones is a 60 y.o. male with no significant past medical history presented to the emergency room at [facility] with dizziness and falls with some discoordination on the right side and numbness on the left.  He was then brought into the emergency department and was noted to have acute cerebellar infarct.  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  Vascular surgery was consulted who recommended transfer to higher center.  Neurology saw the patient in the hospital who ordered MRI of the brain and echocardiogram.  Patient was then started on heparin drip and transferred to [facility] for further evaluation and treatment.  Subjective:  Sitting up in chair, reports dysarthria is better, still right side clumsiness He is tolerating heparin drip, denies chest pain, Family at bedside   Assessment & Plan:   Principal Problem:   Cerebellar stroke [facility]) Active Problems:   Aortic thrombus (HCC)   Impaired fasting glucose   Malnutrition of moderate degree  Cerebellar stroke Likely from aortic thrombus and embolism He started on statin He is on heparin drip, neurology evaluated recommend transition to Eliquis and signed off  Aortic thrombus Case discussed with vascular surgery Dr. Daphane Shepherd who is going to see patient later today, will follow recommendation  Hypertension Does not appear to be on medication at home He is started on Norvasc  New diagnosis of diabetes A1c 6.5 Start diabetes education Carb modified diet Consider metformin at discharge    Nutrition Status: Nutrition Problem: Moderate Malnutrition Etiology: acute illness (stroke) Signs/Symptoms: mild fat depletion, mild muscle depletion Interventions: MVI, Education,  Glucerna shake  .      Unresulted Labs (From admission, onward)     Start     Ordered   09/13/20 0500  Heparin level (unfractionated)  Daily,   R      09/12/20 1809   09/13/20 0500  CBC  Daily,   R      09/12/20 1809              DVT prophylaxis:   On heparin drip   Code Status: Full Family Communication: Family at bedside  disposition:   Status is: Inpatient   Dispo: The patient is from: Home              Anticipated d/c is to: CIR              Anticipated d/c date is: When bed is available, follow-up on vascular surgery recommendation                Consultants:  Neurology Vascular surgery Dr Stanford Breed   Procedures:  None  Antimicrobials:   None     Objective: Vitals:   09/14/20 2350 09/15/20 0739 09/15/20 1031 09/15/20 1201  BP: (!) 177/96 (!) 168/90 (!) 178/99 (!) 151/88  Pulse: 64 (!) 57  68  Resp: '16 10  14  '$ Temp: 98.2 F (36.8 C) 98.8 F (37.1 C)  97.9 F (36.6 C)  TempSrc: Oral Oral  Oral  SpO2: 98% 100%  100%    Intake/Output Summary (Last 24 hours) at 09/15/2020 1306 Last data filed at 09/15/2020 0500 Gross per 24 hour  Intake --  Output 3100 ml  Net -3100 ml   There were no vitals filed for  this visit.  Examination:  General exam: calm, NAD Respiratory system: Clear to auscultation. Respiratory effort normal. Cardiovascular system: S1 & S2 heard, RRR. No JVD, no murmur, No pedal edema. Gastrointestinal system: Abdomen is nondistended, soft and nontender.  Normal bowel sounds heard. Central nervous system: Alert and oriented.  Finger-to-nose incoordination on the right Extremities: Symmetric 5 x 5 power. Skin: No rashes, lesions or ulcers Psychiatry: Judgement and insight appear normal. Mood & affect appropriate.     Data Reviewed: I have personally reviewed following labs and imaging studies  CBC: Recent Labs  Lab 09/11/20 0939 09/12/20 0930 09/13/20 0146 09/14/20 0247 09/15/20 0509  WBC 6.5 8.4 5.9 5.7 4.5   NEUTROABS  --  6.5  --   --   --   HGB 14.7 16.3 15.3 14.9 13.5  HCT 43.3 48.2 46.2 46.4 40.5  MCV 94.3 93.6 94.5 96.1 95.3  PLT 335 373 398 327 [id number]    Basic Metabolic Panel: Recent Labs  Lab 09/11/20 0939 09/12/20 0930 09/13/20 0146 09/14/20 0247  NA 133* 136 134* 136  K 3.8 3.8 3.6 4.1  CL 96* 100 101 106  CO2 '22 24 23 22  '$ GLUCOSE 186* 139* 118* 109*  BUN '12 14 14 13  '$ CREATININE 0.90 0.86 0.99 0.93  CALCIUM 9.7 10.0 9.5 9.3  MG  --   --  2.4 2.1  PHOS  --   --   --  3.7    GFR: Estimated Creatinine Clearance: 90.5 mL/min (by C-G formula based on SCr of 0.93 mg/dL).  Liver Function Tests: No results for input(s): AST, ALT, ALKPHOS, BILITOT, PROT, ALBUMIN in the last 168 hours.  CBG: Recent Labs  Lab 09/12/20 0732  GLUCAP 115*     Recent Results (from the past 240 hour(s))  Resp Panel by RT-PCR (Flu A&B, Covid) Nasopharyngeal Swab     Status: None   Collection Time: 09/11/20 12:08 PM   Specimen: Nasopharyngeal Swab; Nasopharyngeal(NP) swabs in vial transport medium  Result Value Ref Range Status   SARS Coronavirus 2 by RT PCR NEGATIVE NEGATIVE Final    Comment: (NOTE) SARS-CoV-2 target nucleic acids are NOT DETECTED.  The SARS-CoV-2 RNA is generally detectable in upper respiratory specimens during the acute phase of infection. The lowest concentration of SARS-CoV-2 viral copies this assay can detect is 138 copies/mL. A negative result does not preclude SARS-Cov-2 infection and should not be used as the sole basis for treatment or other patient management decisions. A negative result may occur with  improper specimen collection/handling, submission of specimen other than nasopharyngeal swab, presence of viral mutation(s) within the areas targeted by this assay, and inadequate number of viral copies(<138 copies/mL). A negative result must be combined with clinical observations, patient history, and epidemiological information. The expected result is  Negative.  Fact Sheet for Patients:  EntrepreneurPulse.com.au  Fact Sheet for Healthcare Providers:  IncredibleEmployment.be  This test is no t yet approved or cleared by the [place] FDA and  has been authorized for detection and/or diagnosis of SARS-CoV-2 by FDA under an Emergency Use Authorization (EUA). This EUA will remain  in effect (meaning this test can be used) for the duration of the COVID-19 declaration under Section 564(b)(1) of the Act, 21 U.S.C.section 360bbb-3(b)(1), unless the authorization is terminated  or revoked sooner.       Influenza A by PCR NEGATIVE NEGATIVE Final   Influenza B by PCR NEGATIVE NEGATIVE Final    Comment: (NOTE) The Xpert Xpress SARS-CoV-2/FLU/RSV plus assay  is intended as an aid in the diagnosis of influenza from Nasopharyngeal swab specimens and should not be used as a sole basis for treatment. Nasal washings and aspirates are unacceptable for Xpert Xpress SARS-CoV-2/FLU/RSV testing.  Fact Sheet for Patients: EntrepreneurPulse.com.au  Fact Sheet for Healthcare Providers: IncredibleEmployment.be  This test is not yet approved or cleared by the [place] FDA and has been authorized for detection and/or diagnosis of SARS-CoV-2 by FDA under an Emergency Use Authorization (EUA). This EUA will remain in effect (meaning this test can be used) for the duration of the COVID-19 declaration under Section 564(b)(1) of the Act, 21 U.S.C. section 360bbb-3(b)(1), unless the authorization is terminated or revoked.  Performed at [facility], [address] [place], [place] [address]          Radiology Studies: No results found.      Scheduled Meds:  amLODipine  2.5 mg Oral Daily   atorvastatin  40 mg Oral Daily   feeding supplement (GLUCERNA SHAKE)  237 mL Oral TID BM   multivitamin with minerals  1 tablet Oral Daily   Continuous  Infusions:  sodium chloride 100 mL/hr at 09/15/20 0828   heparin 1,300 Units/hr (09/15/20 0022)     LOS: 3 days   Time spent: 52mns Greater than 50% of this time was spent in counseling, explanation of diagnosis, planning of further management, and coordination of care.   Voice Recognition /Viviann Sparedictation system was used to create this note, attempts have been made to correct errors. Please contact the author with questions and/or clarifications.   FFlorencia Reasons MD PhD FACP [facility]  Available via Epic secure chat 7am-7pm for nonurgent issues Please page for urgent issues To page the attending provider between 7A-7P or the covering provider during after hours 7P-7A, please log into the web site [email or web address] and access using universal [place] password for that web site. If you do not have the password, please call the hospital operator.    09/15/2020, 1:06 PM

## 2020-09-15 NOTE — Evaluation (Signed)
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 Speech is mildly dysarthric. 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 Pt without significant PMH. Speech evaluation ordered.   Assessment / Plan / Recommendation Clinical Impression  Cerebellar Cognitive Affective Schmahamann Syndrome Scale administered with pt scoring as listed:  Semantic Fluency 8, Phonemic Fluency 10, Category Switching 4, Digit Span Forward 5, Digit Span Reverse 3, Cube draw 4, Verbal Recall 4, Similarities 4, Go-No-Go 2, Affect 4 - With total raw score of 48/120 and 6/10.  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  In addition, pt with mild dysarthria resulting in ataxic output with imprecise articulation - with consonants and most profound with DDK rate and decreased phonatory strength.  SLP advised pt assure background noise is eliminated due to improve his listener comprehension.  Therefore he will benefit from follow up SLP to maximize his rehab and decrease  caregiver burden.  SLP reviewed recommended speech treatement to which he was agreeable.  Pt advises he can get frustrated and shut down - recommend he become frustrated "enough" to motivate him in recovery.    SLP Assessment  SLP Recommendation/Assessment: Patient needs continued Speech Lanaguage Pathology Services SLP Visit Diagnosis: Dysarthria and anarthria (R47.1);Cognitive communication deficit (R41.841)    Follow Up Recommendations  Inpatient Rehab    Frequency and Duration min 1 x/week  1 week      SLP Evaluation Cognition  Overall Cognitive Status: No family/caregiver present to determine baseline cognitive functioning Arousal/Alertness: Awake/alert Orientation Level: Oriented X4 Attention: Sustained Sustained Attention: Impaired Sustained Attention Impairment: Verbal complex Memory: Impaired Memory Impairment: Retrieval deficit Comments: pt with recall difficulties - recalling 2/5 words independently, 1 of 5 with category cue, did not recall two others despite multiple choice       Comprehension  Auditory Comprehension Overall Auditory Comprehension: Appears within functional limits for tasks assessed Yes/No Questions: Not tested Commands: Within Functional Limits Conversation: Complex (at times requiring repetition) Interfering Components: Attention EffectiveTechniques: Repetition Visual Recognition/Discrimination Discrimination: Not tested Reading Comprehension Reading Status: Not tested    Expression Expression Primary Mode of Expression: Verbal Verbal Expression Overall Verbal Expression: Impaired Initiation: No impairment Naming: Not tested Written Expression Dominant Hand: Right Written Expression: Exceptions to [facility] (drawing cube difficult due to coordination, able to tell SLP where to put lines for square but not cute)   Oral / Motor  Oral Motor/Sensory Function Overall Oral Motor/Sensory Function: Mild impairment Facial Sensation: Other  (Comment);Suspected CN V (Trigeminal) dysfunction (pt reports "tingling on the left of his entire face") Motor Speech  Overall Motor Speech: Impaired Respiration: Impaired Level of Impairment: Phrase Phonation: Low vocal intensity Resonance: Within functional limits Intelligibility: Intelligibility reduced Word: 75-100% accurate Phrase: 75-100% accurate Sentence: 50-74% accurate Conversation: 50-74% accurate Motor Planning: Witnin functional limits Effective Techniques: Slow rate;Over-articulate   GO                    Malik Jones 09/15/2020, 9:17 AM  Kathleen Lime, MS [facility] SLP Acute Rehab Services Office [phone] Pager [phone]

## 2020-09-15 NOTE — PMR Pre-admission (Signed)
PMR Admission Coordinator Pre-Admission Assessment  Patient: Malik Jones is an 60 y.o., male MRN: [id number] DOB: [birth year] Height:   Weight:    Insurance Information HMO:     PPO: yes     PCP:      IPA:      80/20:      OTHER:  PRIMARY: BCBS      Policy#: [id number]    Subscriber: Pt  CM Name: Virgina Evener     Phone#: [phone] ext [phone]     Fax#: [phone]  Pre-Cert#: [id number]   I received a call from Collie Siad at [facility] 09/18/20 giving authorization for admission 09/18/20-09/25/20 with updates due on 09/25/20   Employer: [employer]  Phone #:  [phone], ext [phone]     Name:  Irene Shipper Date: 02/10/2020 - still active Deductible: $3,500 (0 met) OOP Max: $7,350 ($0 met) CIR: 70% coverage, 30% co-insurance SNF: 70% coverage, 30% co-insurance, limited to 100 days/cal yr; 100 remaining Outpatient:  70% coverage, 30% co-insurance, limited to 60 visits combined/cal yr(60 remaining) Home Health:  70% coverage, 30% co-insurance DME: 70% coverage, 30% co-insurance  SECONDARY: none       Policy#:      Phone#:   The "Data Collection Information Summary" for patients in Inpatient Rehabilitation Facilities with attached "Privacy Act [place] Records" was provided and verbally reviewed with: Patient  Emergency Contact Information Contact Information     Name Relation Home Work Mobile   [place] Sister   [phone]   Larwance Rote   [phone]   Lynn, Sissel   [phone]       Current Medical History  Patient Admitting Diagnosis: CVA History of Present Illness:  Malik Jones is a 60 y.o. male no significant past medical history other than marijuana abuse, presented to the [facility]  emergency room  09/11/20 for evaluation of unsteadiness and clumsiness on the right side. CT showed acute cerebellar infarct.  CTA of the head and neck was found to have free-floating) thrombus along the ascending thoracic  aorta.  Vascular surgery was consulted who recommended transfer to higher center.  Neurology saw the patient in the hospital who ordered MRI of the brain and echocardiogram.  Patient was then started on heparin drip and transferred to [facility] for further evaluation and treatment. 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 CIR was consulted to assist in return to PLOF.  Patient's medical record from [facility] has been reviewed by the rehabilitation admission coordinator and physician.  Past Medical History  Past Medical History:  Diagnosis Date   Medical history non-contributory     Family History   family history includes Hip fracture in his father; Kidney disease in his mother.  Prior Rehab/Hospitalizations Has the patient had prior rehab or hospitalizations prior to admission? No  Has the patient had major surgery during 100 days prior to admission? No   Current Medications  Current Facility-Administered Medications:    0.9 %  sodium chloride infusion, , Intravenous, Continuous, Pokhrel, Laxman, MD, Last Rate: 100 mL/hr at 09/15/20 0828, New Bag at 09/15/20 4627   acetaminophen (TYLENOL) tablet 650 mg, 650 mg, Oral, Q4H PRN **OR** acetaminophen (TYLENOL) 160 MG/5ML solution 650 mg, 650 mg, Per Tube, Q4H PRN **OR** acetaminophen (TYLENOL) suppository 650 mg, 650 mg, Rectal,  Q4H PRN, Pokhrel, Laxman, MD   amLODipine (NORVASC) tablet 2.5 mg, 2.5 mg, Oral, Daily, Donnamae Jude, MD, 2.5 mg at 09/15/20 1031   atorvastatin (LIPITOR) tablet 40 mg, 40 mg, Oral, Daily, Pokhrel, Laxman, MD, 40 mg at 09/15/20 1031   feeding supplement (GLUCERNA SHAKE) (GLUCERNA SHAKE) liquid 237 mL, 237 mL, Oral, TID BM, Pokhrel, Laxman, MD, 237 mL at  09/15/20 1031   heparin ADULT infusion 100 units/mL (25000 units/252m), 1,300 Units/hr, Intravenous, Continuous, BLaren Everts RPH, Last Rate: 13 mL/hr at 09/15/20 0022, 1,300 Units/hr at 09/15/20 0022   multivitamin with minerals tablet 1 tablet, 1 tablet, Oral, Daily, Pokhrel, Laxman, MD, 1 tablet at 09/15/20 1031   senna-docusate (Senokot-S) tablet 1 tablet, 1 tablet, Oral, QHS PRN, Pokhrel, Laxman, MD  Patients Current Diet:  Diet Order             Diet regular Room service appropriate? Yes; Fluid consistency: Thin  Diet effective now                   Precautions / Restrictions Precautions Precautions: Fall Precaution Comments: ataxia Restrictions Weight Bearing Restrictions: No   Has the patient had 2 or more falls or a fall with injury in the past year? No  Prior Activity Level Community (5-7x/wk): Pt. was active in the community PTA  Prior Functional Level Self Care: Did the patient need help bathing, dressing, using the toilet or eating? Independent  Indoor Mobility: Did the patient need assistance with walking from room to room (with or without device)? Independent  Stairs: Did the patient need assistance with internal or external stairs (with or without device)? Independent  Functional Cognition: Did the patient need help planning regular tasks such as shopping or remembering to take medications? Independent  Home Assistive Devices / Equipment Home Equipment: None  Prior Device Use: Indicate devices/aids used by the patient prior to current illness, exacerbation or injury? None of the above  Current Functional Level Cognition  Arousal/Alertness: Awake/alert Overall Cognitive Status: No family/caregiver present to determine baseline cognitive functioning Orientation Level: Oriented X4 General Comments: Fairly flat affect, follow multi step commands, Ox4, Attention: Sustained Sustained Attention: Impaired Sustained Attention Impairment: Verbal  complex Memory: Impaired Memory Impairment: Retrieval deficit Comments: pt with recall difficulties - recalling 2/5 words independently, 1 of 5 with category cue, did not recall two others despite multiple choice    Extremity Assessment (includes Sensation/Coordination)  Upper Extremity Assessment: RUE deficits/detail, LUE deficits/detail RUE Deficits / Details: apparent motor sensory deficits resulting in decreased gross/fine motor coordination. poor control of speed and accuracy of movement, making funciotnal use of RUE difficult. strength WFL; dropping items LUE Deficits / Details: Strength WFL; decreased sensation throughout LUE  Lower Extremity Assessment: Defer to PT evaluation RLE Deficits / Details: dysmetria/dysdiadochokinesia/ataxia noted (R worse than L); gross strength of 5/5 MMT throughout; sensation to light touch intact RLE Coordination: decreased fine motor, decreased gross motor LLE Deficits / Details: dysmetria/dysdiadochokinesia/ataxia noted (R worse than L); gross strength of 5/5 MMT throughout; slight numbness to light touch noted throughout LLE Sensation: decreased light touch LLE Coordination: decreased fine motor, decreased gross motor    ADLs  Overall ADL's : Needs assistance/impaired Eating/Feeding: Set up, Sitting (feeding self with LUE as unable to feed self with R dominant hand) Grooming: Minimal assistance, Sitting Upper Body Bathing: Minimal assistance, Sitting Lower Body Bathing: Moderate assistance, Sit to/from stand Upper Body Dressing : Moderate assistance, Sitting Lower Body Dressing: Maximal assistance, Sit to/from stand Toilet  gross motor     ADLs   Overall ADL's : Needs assistance/impaired Eating/Feeding: Set up, Sitting (feeding self with LUE as unable to feed self with R dominant hand) Grooming: Minimal assistance, Sitting Upper Body Bathing: Minimal assistance, Sitting Lower Body Bathing: Moderate assistance, Sit to/from stand Upper Body Dressing : Moderate assistance, Sitting Lower Body Dressing: Maximal assistance, Sit to/from stand Toilet Transfer: Moderate assistance, RW Toileting- Clothing Manipulation and Hygiene: Moderate assistance, Sit to/from stand Functional mobility during ADLs: Moderate assistance General ADL Comments: improved performance with mod cues including sequencing and visual  reminders/sustained attention     Mobility   Overal bed mobility: Needs Assistance Bed Mobility: Supine to Sit Supine to sit: Supervision General bed mobility comments: supervision for safety     Transfers   Overall transfer level: Needs assistance Equipment used: Rolling walker (2 wheeled) Transfers: Sit to/from Stand Sit to Stand: Min guard General transfer comment: min guard consistently with cues for hand placement and posture with use of RW for support.     Ambulation / Gait / Stairs / Wheelchair Mobility   Ambulation/Gait Ambulation/Gait assistance: Min assist, Mod assist Gait Distance (Feet): 150 Feet Assistive device: Rolling walker (2 wheeled) Gait Pattern/deviations: Step-through pattern, Decreased stride length, Ataxic, Trunk flexed General Gait Details: LOB x 4-5 during ambulation (more with fatigue) with modA to recover. MinA for balance overall. Cues for stopping and restarting when patient becomes increasingly unsteady rather than pushing self which increases fall risk. Standing rest break x 5 throughout ambulation. HR max 110. Patient with forceful placement of R LE at times, cues for control and soft placement. Gait velocity: decreased Gait velocity interpretation: <1.31 ft/sec, indicative of household ambulator     Posture / Balance Dynamic Sitting Balance Sitting balance - Comments: Reaching min off BOS to donn socks with min guard assist, truncal instability noted. Balance Overall balance assessment: Needs assistance Sitting-balance support: No upper extremity supported, Feet supported Sitting balance-Leahy Scale: Fair Sitting balance - Comments: Reaching min off BOS to donn socks with min guard assist, truncal instability noted. Standing balance support: Bilateral upper extremity supported Standing balance-Leahy Scale: Poor Standing balance comment: reliant on UE support and external assist     Special needs/care consideration None     Previous Home  Environment (from acute therapy documentation) Living Arrangements: Alone Available Help at Discharge: Family, Available 24 hours/day Type of Home: House Home Layout: One level Home Access: Stairs to enter Entrance Stairs-Rails: None Entrance Stairs-Number of Steps: 4-5 Bathroom Shower/Tub: Walk-in shower Bathroom Toilet: Standard Bathroom Accessibility: Yes How Accessible: Accessible via walker   Discharge Living Setting Plans for Discharge Living Setting: Alone Type of Home at Discharge: House Discharge Home Layout: One level Discharge Home Access: Stairs to enter Entrance Stairs-Rails: None Entrance Stairs-Number of Steps: 4-5 Discharge Bathroom Shower/Tub: Walk-in shower Discharge Bathroom Toilet: Standard Discharge Bathroom Accessibility: Yes   Social/Family/Support Systems Patient Roles: Other (Comment) Contact Information: [phone] Anticipated Caregiver: Jamil (son) Anticipated Caregiver's Contact Information: [phone] Caregiver Availability: 24/7 Discharge Plan Discussed with Primary Caregiver: Yes Is Caregiver In Agreement with Plan?: Yes Does Caregiver/Family have Issues with Lodging/Transportation while Pt is in Rehab?: No   Goals Patient/Family Goal for Rehab: PT/OT/SLP Supervision Expected length of stay: 12-14 days Pt/Family Agrees to Admission and willing to participate: Yes Program Orientation Provided & Reviewed with Pt/Caregiver Including Roles  & Responsibilities: Yes   Decrease burden of Care through IP rehab admission: Specialzed equipment needs, Decrease number of caregivers, and Patient/family education   Possible   the coordinated team approach during an Inpatient Acute Rehabilitation admission.  The patient will receive intensive therapy as well as Rehabilitation physician, nursing, social worker, and care management interventions.  Due to safety, disease management, medication administration, pain management, and patient education the patient requires 24 hour a day rehabilitation nursing.  The patient is currently Min A with mobility and basic ADLs.  Discharge setting and therapy post discharge at home with home health is anticipated.  Patient has agreed to participate in the Acute Inpatient Rehabilitation Program and will admit today.  Preadmission Screen Completed By:  Genella Mech, 09/15/2020 12:14 PM ______________________________________________________________________   Discussed status with Dr. Posey Pronto  on 09/18/20 at 80 and received approval for admission today.  Admission Coordinator:  Genella Mech, CCC-SLP, time 1040/Date 09/18/20   Assessment/Plan: Diagnosis: acute cerebellar infarct Does the need for close, 24 hr/day Medical supervision in concert with the patient's rehab needs make it unreasonable for this patient to be served in a less intensive setting? Yes Co-Morbidities requiring supervision/potential complications: marijuana abuse (counsel when appropriate), Tachycardia (monitor in accordance with pain and increasing activity), DM (Monitor in accordance with exercise and adjust meds as necessary) Due to bladder management, bowel management, disease management, and patient education, does the patient require 24 hr/day rehab nursing? Yes Does the patient require coordinated care of a physician, rehab nurse, PT, OT, and SLP to address physical and functional deficits in the context of the above medical diagnosis(es)? Yes Addressing deficits in the following areas: balance, endurance, locomotion, strength, transferring, bathing, dressing,  toileting, cognition, and psychosocial support Can the patient actively participate in an intensive therapy program of at least 3 hrs of therapy 5 days a week? Yes The potential for patient to make measurable gains while on inpatient rehab is excellent Anticipated functional outcomes upon discharge from inpatient rehab: supervision PT, supervision OT, modified independent and supervision SLP Estimated rehab length of stay to reach the above functional goals is: 10-14 days. Anticipated discharge destination: Home 10. Overall Rehab/Functional Prognosis: good   MD Signature: Delice Lesch, MD, ABPMR

## 2020-09-15 NOTE — Consult Note (Signed)
[place] for  IV Heparin Indication: stroke and aortic thrombus  No Known Allergies  Patient Measurements: Total Body Weight: 73.9 kg Height: 75 inches Heparin Dosing Weight: 73.9 kg  Vital Signs: Temp: 98.2 F (36.8 C) (08/06 2350) Temp Source: Oral (08/06 2350) BP: 177/96 (08/06 2350) Pulse Rate: 64 (08/06 2350)  Labs: Recent Labs    09/12/20 0930 09/12/20 1826 09/13/20 0146 09/13/20 0949 09/13/20 2004 09/14/20 0247 09/15/20 0509  HGB 16.3  --  15.3  --   --  14.9 13.5  HCT 48.2  --  46.2  --   --  46.4 40.5  PLT 373  --  398  --   --  327 281  HEPARINUNFRC  --    < > 0.68   < > 0.33 0.34 0.44  CREATININE 0.86  --  0.99  --   --  0.93  --    < > = values in this interval not displayed.   Estimated Creatinine Clearance: 90.5 mL/min (by C-G formula based on SCr of 0.93 mg/dL).  Medical History: Past Medical History:  Diagnosis Date   Medical history non-contributory     Assessment: 60 yr old man presented to [facility] ED on 09/11/20 with L-sided weakness; head CT showed R nonhemorrhagic cerebellar stroke (pt out of window for tPA) and thrombus along ascending thoracic aorta (likely the etiology of embolic infarction). Pt was started on heparin infusion at [facility]. He was transferred to [facility] for further evaluation. Pt was on no anticoagulants PTA to [facility].  Pt continues on heparin at 1300 units/hr.  Heparin level remains therapeutic.  Disussed with RN- no s/sx of bleeding or infusion issues. CBC stable.   Goal of Therapy:  Heparin level: 0.3-0.5 units/ml (stroke protocol, no boluses due to recent stroke) Monitor platelets by anticoagulation protocol: Yes   Plan:  Continue heparin infusion at 1300 units/hr Monitor daily heparin level, CBC Monitor for bleeding  Noted Stroke team recommendations for eventual transition to Eliquis for 3-6 months. Will ask for copay check on Monday 8/8.  **Pharmacist phone directory can  be found on [email or web address] listed under [place].  Dimple Nanas, PharmD 09/15/2020 7:39 AM

## 2020-09-16 ENCOUNTER — Other Ambulatory Visit (HOSPITAL_COMMUNITY): Payer: Self-pay

## 2020-09-16 ENCOUNTER — Inpatient Hospital Stay (HOSPITAL_COMMUNITY): Payer: BC Managed Care – PPO

## 2020-09-16 DIAGNOSIS — I7411 Embolism and thrombosis of thoracic aorta: Secondary | ICD-10-CM

## 2020-09-16 DIAGNOSIS — R599 Enlarged lymph nodes, unspecified: Secondary | ICD-10-CM

## 2020-09-16 DIAGNOSIS — G464 Cerebellar stroke syndrome: Secondary | ICD-10-CM

## 2020-09-16 LAB — HEPARIN LEVEL (UNFRACTIONATED): Heparin Unfractionated: 0.36 IU/mL (ref 0.30–0.70)

## 2020-09-16 LAB — CBC
HCT: 41.1 % (ref 39.0–52.0)
Hemoglobin: 13.4 g/dL (ref 13.0–17.0)
MCH: 31.1 pg (ref 26.0–34.0)
MCHC: 32.6 g/dL (ref 30.0–36.0)
MCV: 95.4 fL (ref 80.0–100.0)
Platelets: 324 10*3/uL (ref 150–400)
RBC: 4.31 MIL/uL (ref 4.22–5.81)
RDW: 12.9 % (ref 11.5–15.5)
WBC: 4.2 10*3/uL (ref 4.0–10.5)
nRBC: 0 % (ref 0.0–0.2)

## 2020-09-16 IMAGING — CT CT ANGIO CHEST
2 of 5 series · 19 of 46 positions shown · IV contrast (omnipaque)
Comparison: [DATE]

CLINICAL DATA: History of aortic thrombus, stroke

EXAM:
CT ANGIOGRAPHY CHEST WITH CONTRAST
TECHNIQUE: Multidetector CT imaging of the chest was performed using the
standard protocol during bolus administration of intravenous
contrast. Multiplanar CT image reconstructions and MIPs were
obtained to evaluate the vascular anatomy.
CONTRAST:  125mL OMNIPAQUE IOHEXOL 350 MG/ML SOLN

[Series 5: thoracic cta 2mm · axial · 0.78mm/px · z∈[-267,+27]mm · 16 of 159 slices shown]
[im 6/159  lung]
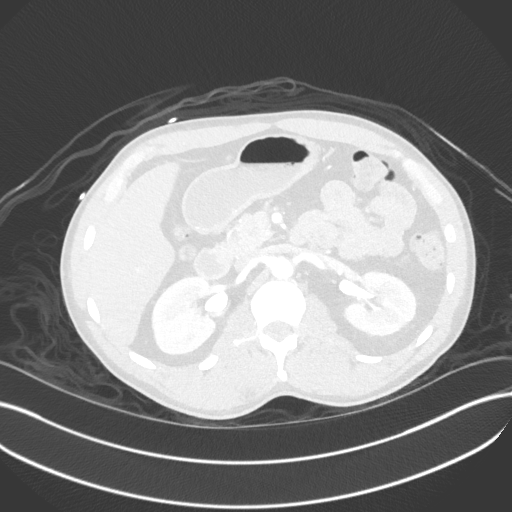
[im 16/159  soft-tissue]
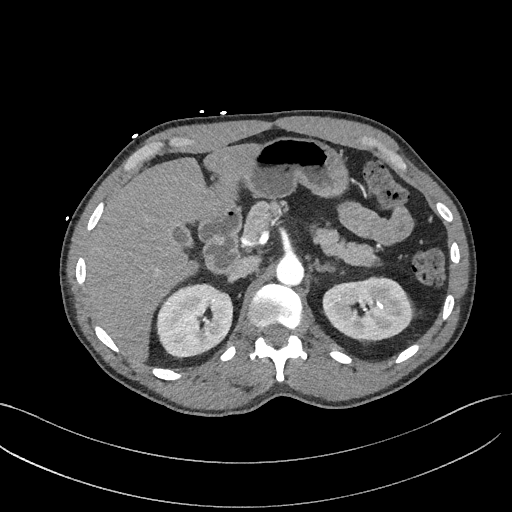
[im 26/159  lung]
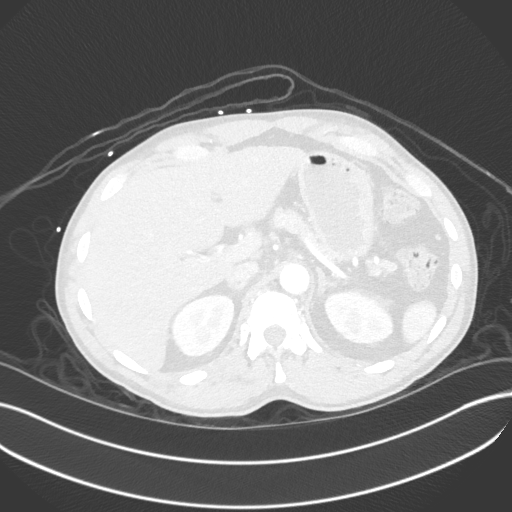
[im 36/159  soft-tissue]
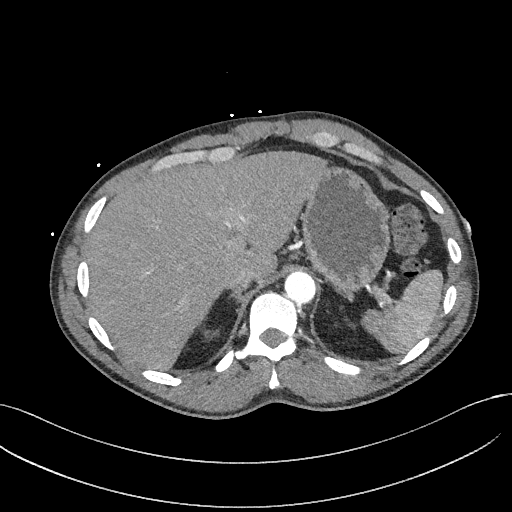
[im 46/159  lung]
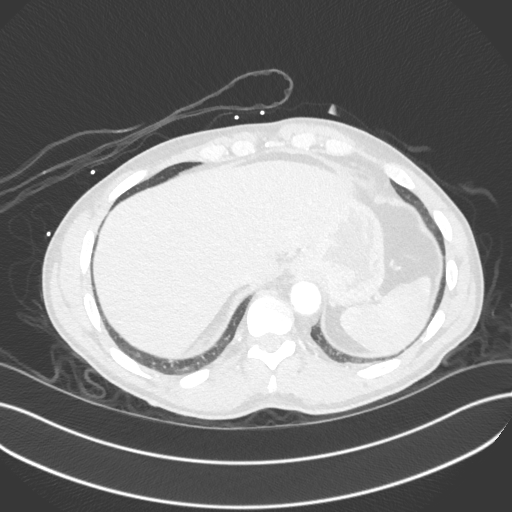
[im 57/159  soft-tissue]
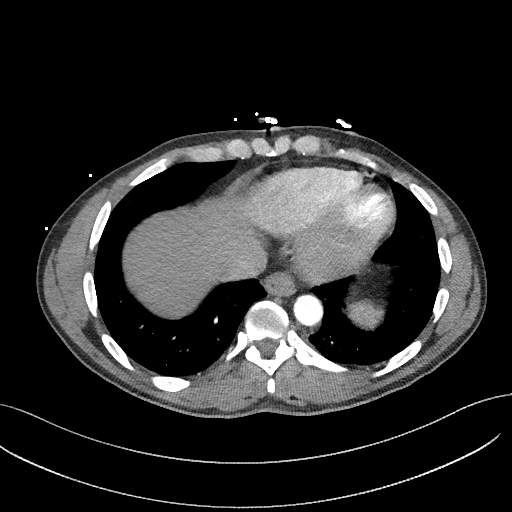
[im 67/159  lung]
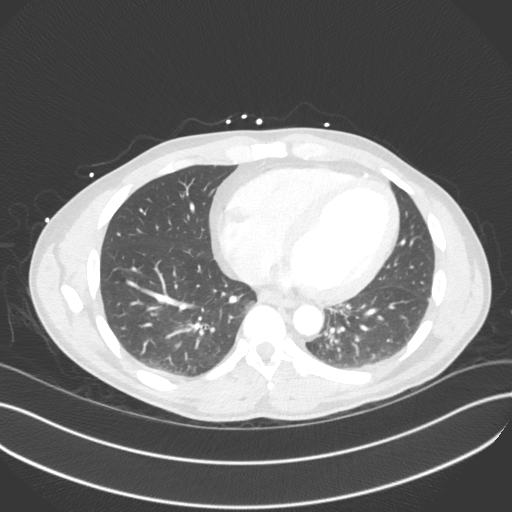
[im 77/159  soft-tissue]
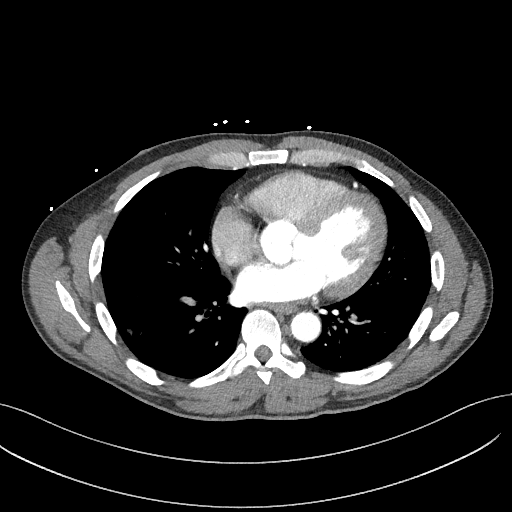
[im 82/159  lung]
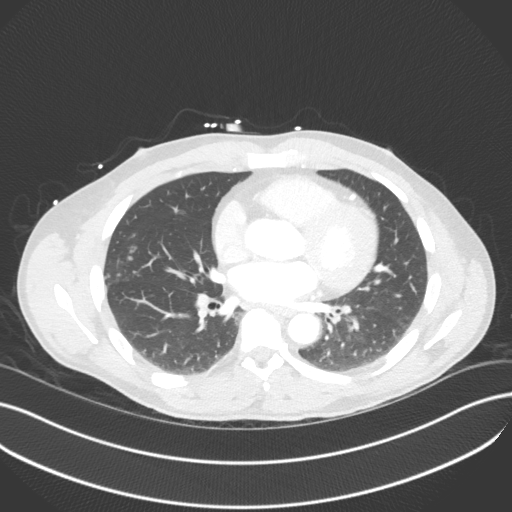
[im 92/159  soft-tissue]
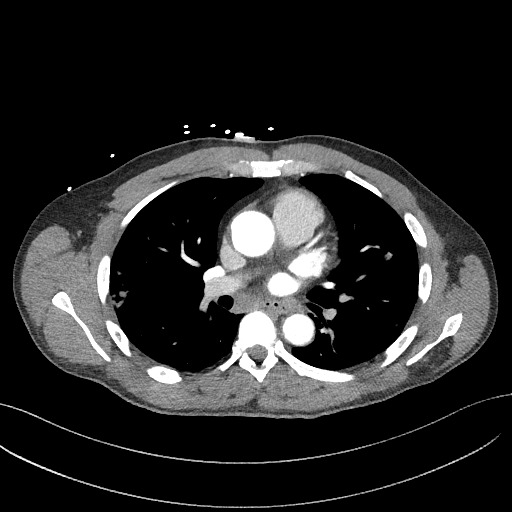
[im 102/159  lung]
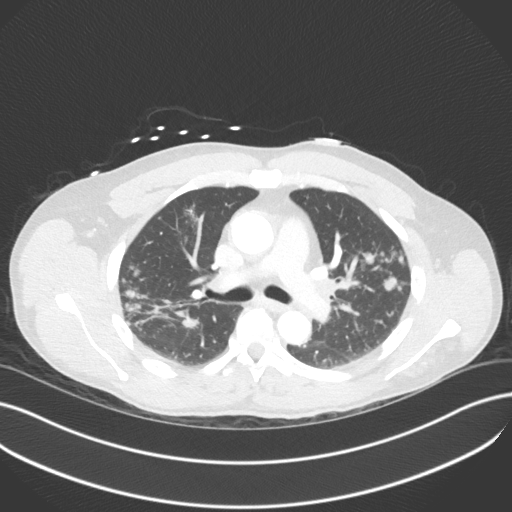
[im 113/159  soft-tissue]
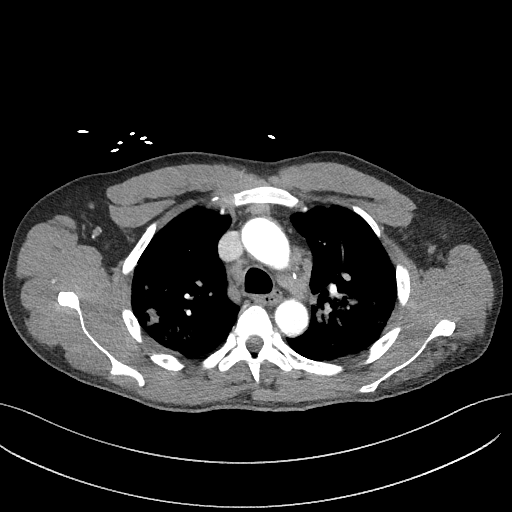
[im 123/159  lung]
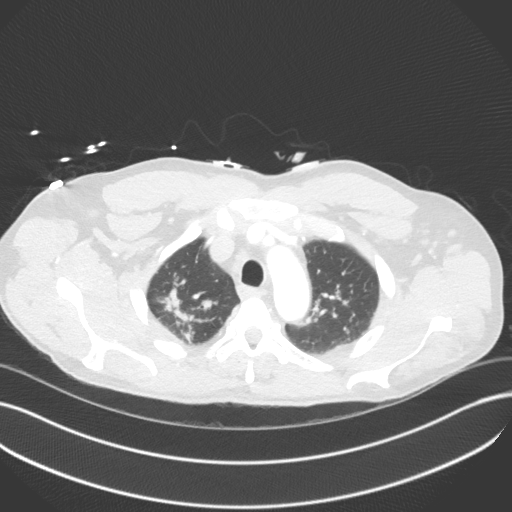
[im 133/159  soft-tissue]
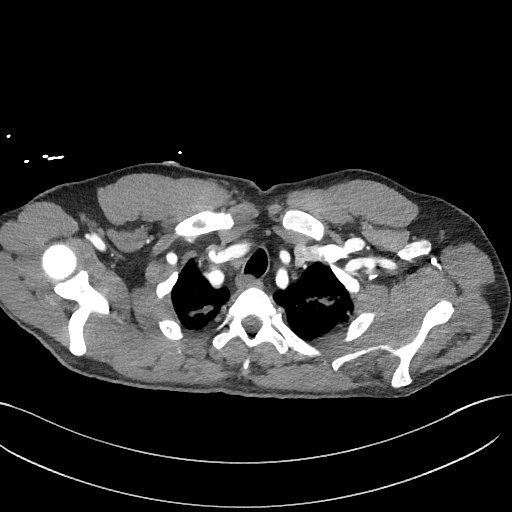
[im 143/159  lung]
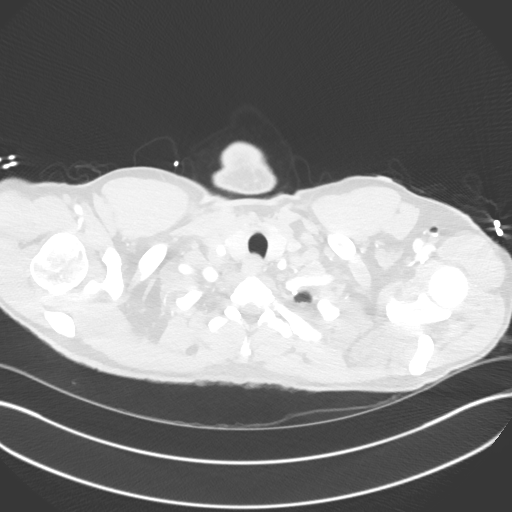
[im 153/159  soft-tissue]
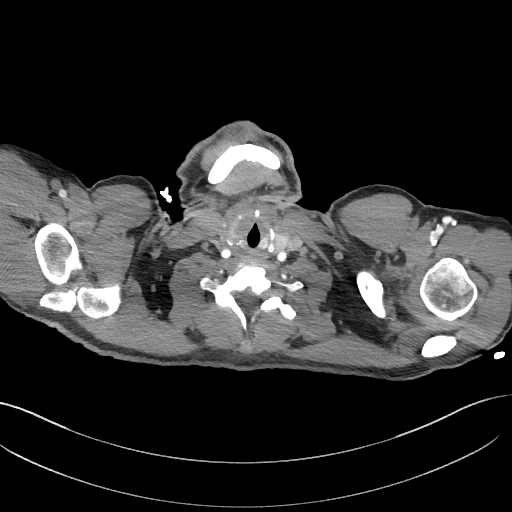

[Series 8: thoracic cta 2mm cor · coronal · 0.62mm/px · 3 of 151 slices shown]
[im 38/151  soft-tissue]
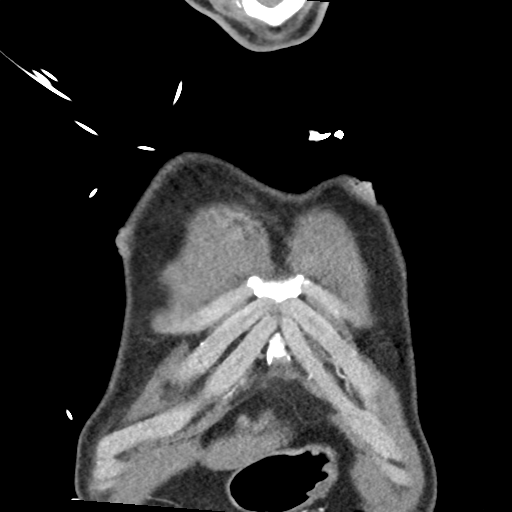
[im 76/151  soft-tissue]
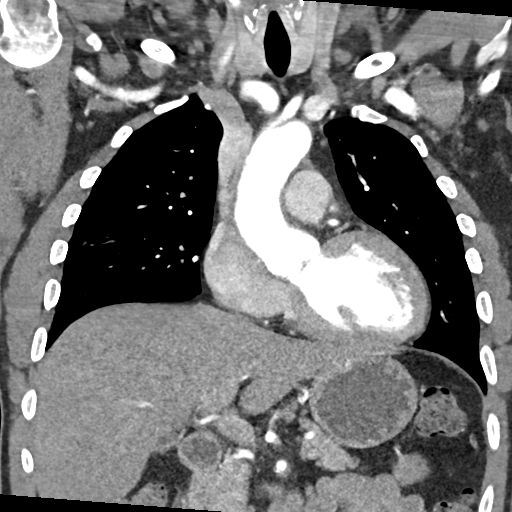
[im 113/151  soft-tissue]
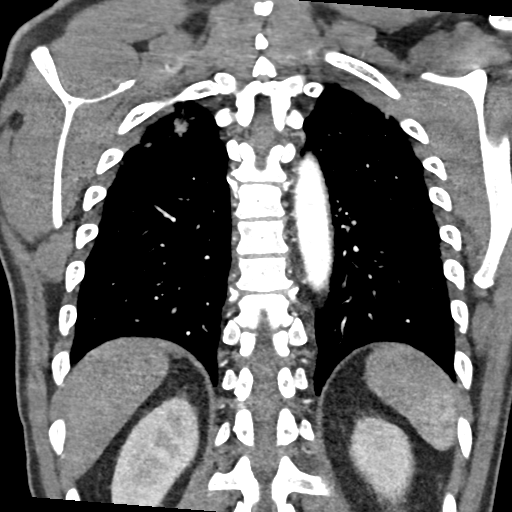

[19 of 46 positions shown; findings below may reference images not displayed]

FINDINGS: Cardiovascular: Preferential opacification of the thoracic aorta. No
evidence of thoracic aortic aneurysm or dissection. Specifically, no
evidence of intraluminal thrombus or other filling defect in the
thoracic aorta. The previously identified wall adherent thrombus in
the anterior wall of the ascending thoracic aorta is no longer
visualized. Normal heart size. No pericardial effusion. No
intracardiac thrombus identified.

Mediastinum/Nodes: Prominent mediastinal lymph nodes are favored
reactive. The thyroid gland appears normal.

Lungs/Pleura: No pleural effusion. No pneumothorax. Nodular
consolidation in the bilateral upper lobes.

Musculoskeletal: No aggressive osseous lesions.

Upper abdomen: There is an approximately 1 cm enhancing lesion in
segment 2 of the liver, which is favored represent a small flash
filling hemangioma. The visualized upper abdomen is otherwise
unremarkable.

Review of the MIP images confirms the above findings.
IMPRESSION: 1. The previously identified wall adherent thrombus in the anterior
wall of the ascending thoracic aorta is no longer visualized. No
other filling defects identified in the thoracic aorta or within the
heart.
2. Nodular consolidation cenetered in the bilateral upper lobes with
reactive mediastinal lymphadenopathy is most likely infectious
pneumonia. Consider follow-up CT chest in 2-3 months to ensure
resolution.

## 2020-09-16 MED ORDER — AMLODIPINE BESYLATE 10 MG PO TABS
10.0000 mg | ORAL_TABLET | Freq: Every day | ORAL | Status: DC
  Administered 2020-09-17 – 2020-09-18 (×2): 10 mg via ORAL
  Filled 2020-09-16 (×2): qty 1

## 2020-09-16 MED ORDER — IOHEXOL 350 MG/ML SOLN
125.0000 mL | Freq: Once | INTRAVENOUS | Status: AC | PRN
  Administered 2020-09-16: 125 mL via INTRAVENOUS

## 2020-09-16 MED ORDER — AMLODIPINE BESYLATE 5 MG PO TABS
5.0000 mg | ORAL_TABLET | Freq: Once | ORAL | Status: AC
  Administered 2020-09-16: 5 mg via ORAL
  Filled 2020-09-16: qty 1

## 2020-09-16 NOTE — Discharge Instructions (Addendum)

## 2020-09-16 NOTE — Progress Notes (Signed)
Occupational Therapy Treatment Patient Details Name: Malik Jones MRN: [id number] DOB: [birth year] Today's Date: 09/16/2020    History of present illness 60 yo male admitted to [facility] on 09/11/20 for evaluation of unsteadiness, R sided weakness, and L sided numbness. Speech is mildly dysarthric. 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 Pt without significant PMH.   OT comments  Pt continues to present with decreased coordination, balance, strength, and activity tolerance. Despite fatigue, pt continues to demonstrate high motivation to participate in therapy. After PT session, pt eager to participate in bathing and grooming at sink. Pt performing grooming, bathing, and LB dressing with Min A for balance. Continues to present with poor coordination and targeted reach with RUE; requiring significant time and effort for incorporating RUE. Continue to recommend dc to CIR for intensive OT and will continue to follow acutely as admitted.    Follow Up Recommendations  CIR    Equipment Recommendations  3 in 1 bedside commode;Tub/shower seat    Recommendations for Other Services Rehab consult    Precautions / Restrictions Precautions Precautions: Fall Precaution Comments: ataxia       Mobility Bed Mobility               General bed mobility comments: Pt up qwith PT upon arrival    Transfers Overall transfer level: Needs assistance Equipment used: Rolling walker (2 wheeled) Transfers: Sit to/from Stand Sit to Stand: Min guard         General transfer comment: Min Guard A for safety    Balance Overall balance assessment: Needs assistance Sitting-balance support: No upper extremity supported;Feet supported Sitting balance-Leahy Scale: Fair     Standing balance support: Bilateral upper extremity supported;No upper extremity supported;During functional activity Standing balance-Leahy  Scale: Poor Standing balance comment: Able to maintain static standing at sink; slight posterior lean at times                           ADL either performed or assessed with clinical judgement   ADL Overall ADL's : Needs assistance/impaired     Grooming: Oral care;Minimal assistance;Standing;Cueing for sequencing;Cueing for safety;Wash/dry face Grooming Details (indicate cue type and reason): Min A for standing balance with slight posteruior lean. Difficulty with targeted reach and grasp during oral care and washing his face. Upper Body Bathing: Min guard;Sitting   Lower Body Bathing: Minimal assistance;Sit to/from stand Lower Body Bathing Details (indicate cue type and reason): Min A for posterior lean     Lower Body Dressing: Minimal assistance;Sit to/from stand Lower Body Dressing Details (indicate cue type and reason): donning underwear and shoes. Min A for posterior lean during donning underwear Toilet Transfer: Min guard;Ambulation;RW (simulated to chair)           Functional mobility during ADLs: Minimal assistance;Rolling walker General ADL Comments: Pt very motivated. Continues to present wityh decreased coroindation, balance, strength, and activity tolerance. Pt performing grooming and simple batyhing task at sink with seated rest breaks     Vision       Perception     Praxis      Cognition Arousal/Alertness: Awake/alert Behavior During Therapy: WFL for tasks assessed/performed;Flat affect Overall Cognitive Status: No family/caregiver present to determine baseline cognitive functioning  Exercises     Shoulder Instructions       General Comments VSS    Pertinent Vitals/ Pain       Pain Assessment: No/denies pain  Home Living                                          Prior Functioning/Environment              Frequency  Min 2X/week        Progress Toward  Goals  OT Goals(current goals can now be found in the care plan section)  Progress towards OT goals: Progressing toward goals  Acute Rehab OT Goals Patient Stated Goal: to do more for myself OT Goal Formulation: With patient Time For Goal Achievement: 09/27/20 Potential to Achieve Goals: Good ADL Goals Pt Will Perform Grooming: sitting;with modified independence Pt Will Perform Upper Body Bathing: sitting;with modified independence Pt Will Perform Lower Body Bathing: with modified independence;sit to/from stand Pt Will Transfer to Toilet: with modified independence;ambulating Pt/caregiver will Perform Home Exercise Program: Right Upper extremity;Left upper extremity;With written HEP provided  Plan Discharge plan remains appropriate    Co-evaluation                 AM-PAC OT "6 Clicks" Daily Activity     Outcome Measure   Help from another person eating meals?: A Little Help from another person taking care of personal grooming?: A Little Help from another person toileting, which includes using toliet, bedpan, or urinal?: A Little Help from another person bathing (including washing, rinsing, drying)?: A Little Help from another person to put on and taking off regular upper body clothing?: A Little Help from another person to put on and taking off regular lower body clothing?: A Little 6 Click Score: 18    End of Session Equipment Utilized During Treatment: Gait belt;Rolling walker  OT Visit Diagnosis: Unsteadiness on feet (R26.81);Other abnormalities of gait and mobility (R26.89);Ataxia, unspecified (R27.0)   Activity Tolerance Patient tolerated treatment well   Patient Left in chair;with call bell/phone within reach;with chair alarm set   Nurse Communication Mobility status        Time: [id number] OT Time Calculation (min): 29 min  Charges: OT General Charges $OT Visit: 1 Visit OT Treatments $Self Care/Home Management : 23-37 mins  [place],  OTR/L Acute Rehab Pager: [phone] Office: [place] 09/16/2020, 9:29 AM

## 2020-09-16 NOTE — Consult Note (Signed)
Reason for Consult: Ascending aortic thrombus Referring Physician: Dr. Farris Has hospitalist  Malik Jones is an 60 y.o. male.   HPI: Malik Jones is a 60 year old man who presented on 09/11/2020 with a chief complaint of dizziness and falls.  He also complained of discoordination on the right side and some numbness on his left side.  He went to the ED at [facility].  CT of the head and neck showed acute cerebellar infarct.  A CTA showed possible free-floating thrombus in the ascending aorta.  He was transferred to [facility].  He was started on a heparin drip.  He has had improvement with his neurologic status although he does still complain of feeling a little uncoordinated particular on the right side.  Past Medical History:  Diagnosis Date   Medical history non-contributory     Past Surgical History:  Procedure Laterality Date   NO PAST SURGERIES      Family History  Problem Relation Age of Onset   Kidney disease Mother    Hip fracture Father     Social History:  reports that he has never smoked. He has never used smokeless tobacco. He reports current alcohol use. He reports current drug use. Drug: Marijuana.  Allergies: No Known Allergies  Medications: Scheduled:  [START ON 09/17/2020] amLODipine  10 mg Oral Daily   atorvastatin  40 mg Oral Daily   feeding supplement (GLUCERNA SHAKE)  237 mL Oral TID BM   multivitamin with minerals  1 tablet Oral Daily    Results for orders placed or performed during the hospital encounter of 09/12/20 (from the past 48 hour(s))  Heparin level (unfractionated)     Status: None   Collection Time: 09/15/20  5:09 AM  Result Value Ref Range   Heparin Unfractionated 0.44 0.30 - 0.70 IU/mL    Comment: (NOTE) The clinical reportable range upper limit is being lowered to >1.10 to align with the FDA approved guidance for the current laboratory assay.  If heparin results are below expected values, and patient dosage has  been confirmed,  suggest follow up testing of antithrombin III levels. Performed at [facility], [place] [address] [place], [place] [address]   CBC     Status: None   Collection Time: 09/15/20  5:09 AM  Result Value Ref Range   WBC 4.5 4.0 - 10.5 K/uL   RBC 4.25 4.22 - 5.81 MIL/uL   Hemoglobin 13.5 13.0 - 17.0 g/dL   HCT 40.5 39.0 - 52.0 %   MCV 95.3 80.0 - 100.0 fL   MCH 31.8 26.0 - 34.0 pg   MCHC 33.3 30.0 - 36.0 g/dL   RDW 13.0 11.5 - 15.5 %   Platelets 281 150 - 400 K/uL   nRBC 0.0 0.0 - 0.2 %    Comment: Performed at [facility], [place] [address], [place], [place] [address]  Heparin level (unfractionated)     Status: None   Collection Time: 09/16/20  4:01 AM  Result Value Ref Range   Heparin Unfractionated 0.36 0.30 - 0.70 IU/mL    Comment: (NOTE) The clinical reportable range upper limit is being lowered to >1.10 to align with the FDA approved guidance for the current laboratory assay.  If heparin results are below expected values, and patient dosage has  been confirmed, suggest follow up testing of antithrombin III levels. Performed at [facility], [place] [address] [place],  [address]   CBC     Status: None  Collection Time: 09/16/20  4:01 AM  Result Value Ref Range   WBC 4.2 4.0 - 10.5 K/uL   RBC 4.31 4.22 - 5.81 MIL/uL   Hemoglobin 13.4 13.0 - 17.0 g/dL   HCT 41.1 39.0 - 52.0 %   MCV 95.4 80.0 - 100.0 fL   MCH 31.1 26.0 - 34.0 pg   MCHC 32.6 30.0 - 36.0 g/dL   RDW 12.9 11.5 - 15.5 %   Platelets 324 150 - 400 K/uL   nRBC 0.0 0.0 - 0.2 %    Comment: Performed at [facility], [place] [address], [place], [place] [address]    CT ANGIO CHEST AORTA W/CM & OR WO/CM  Result Date: 09/16/2020 CLINICAL DATA:  History of aortic thrombus, stroke EXAM: CT ANGIOGRAPHY CHEST WITH CONTRAST TECHNIQUE: Multidetector CT imaging of the chest was performed using the standard protocol during bolus administration of intravenous contrast. Multiplanar CT image  reconstructions and MIPs were obtained to evaluate the vascular anatomy. CONTRAST:  115m OMNIPAQUE IOHEXOL 350 MG/ML SOLN COMPARISON:  September 11, 2020 FINDINGS: Cardiovascular: Preferential opacification of the thoracic aorta. No evidence of thoracic aortic aneurysm or dissection. Specifically, no evidence of intraluminal thrombus or other filling defect in the thoracic aorta. The previously identified wall adherent thrombus in the anterior wall of the ascending thoracic aorta is no longer visualized. Normal heart size. No pericardial effusion. No intracardiac thrombus identified. Mediastinum/Nodes: Prominent mediastinal lymph nodes are favored reactive. The thyroid gland appears normal. Lungs/Pleura: No pleural effusion. No pneumothorax. Nodular consolidation in the bilateral upper lobes. Musculoskeletal: No aggressive osseous lesions. Upper abdomen: There is an approximately 1 cm enhancing lesion in segment 2 of the liver, which is favored represent a small flash filling hemangioma. The visualized upper abdomen is otherwise unremarkable. Review of the MIP images confirms the above findings. IMPRESSION: 1. The previously identified wall adherent thrombus in the anterior wall of the ascending thoracic aorta is no longer visualized. No other filling defects identified in the thoracic aorta or within the heart. 2. Nodular consolidation cenetered in the bilateral upper lobes with reactive mediastinal lymphadenopathy is most likely infectious pneumonia. Consider follow-up CT chest in 2-3 months to ensure resolution. Electronically Signed   By: YAlbin FellingMD   On: 09/16/2020 14:49    I personally reviewed the CT angiogram of the chest.  There is no evidence of thoracic aortic thrombus.  Probable pneumonia with mild mediastinal adenopathy.  Review of Systems  Constitutional:  Negative for fatigue and unexpected weight change.  Respiratory:  Negative for shortness of breath.   Cardiovascular:  Negative for chest  pain and leg swelling.  Neurological:  Positive for dizziness.       Falls, right side coordination problems  Hematological:  Negative for adenopathy. Does not bruise/bleed easily.  All other systems reviewed and are negative. Blood pressure (!) 155/108, pulse 75, temperature 97.9 F (36.6 C), temperature source Oral, resp. rate 16, SpO2 100 %. Physical Exam Constitutional:      General: He is not in acute distress.    Appearance: Normal appearance.  HENT:     Head: Normocephalic and atraumatic.  Eyes:     General: No scleral icterus.    Extraocular Movements: Extraocular movements intact.  Neck:     Vascular: No carotid bruit.  Cardiovascular:     Rate and Rhythm: Normal rate and regular rhythm.     Heart sounds: Normal heart sounds. No murmur heard.   No friction rub. No gallop.  Pulmonary:     Effort: Pulmonary effort is normal. No respiratory distress.     Breath sounds: Normal breath sounds. No wheezing.  Abdominal:     General: There is no distension.     Palpations: Abdomen is soft.  Musculoskeletal:        General: No swelling.     Cervical back: Neck supple.  Skin:    General: Skin is warm and dry.  Neurological:     Mental Status: He is alert and oriented to person, place, and time.     Cranial Nerves: No cranial nerve deficit.     Motor: No weakness (5 out of 5 strength in all 4 extremities).    Assessment/Plan: Mack Czaja is a 60 year old man with a past history of hypertension and hyperlipidemia.  He presented with acute onset of dizziness and falls.  He was diagnosed with a acute cerebellar stroke.  On a CT angiogram of the head and neck on 09/11/2020, there was suggestion of free-floating thrombus in the ascending aorta.  He has had a good neurologic recovery although he does still have some issues with coordination.  A CT angiogram of the chest was done today to further evaluate the possible free-floating thrombus in the ascending aorta.  There was no  thrombus noted.  He does have some mild atherosclerotic changes.  There is no indication for surgery.  He does need ongoing anticoagulation.  He had some changes in the lungs that likely are due to pneumonia.  He has some mild adenopathy.  He needs a follow-up CT in about 3 months, to make sure the adenopathy resolves.  I would suggest a hematology consult to see if he has any underlying hypercoagulable state.  Melrose Nakayama 09/16/2020, 5:33 PM

## 2020-09-16 NOTE — Consult Note (Signed)
[place] for  IV Heparin Indication: stroke and aortic thrombus  No Known Allergies  Patient Measurements: Total Body Weight: 73.9 kg Height: 75 inches Heparin Dosing Weight: 73.9 kg  Vital Signs: Temp: 98.6 F (37 C) (08/08 0909) Temp Source: Oral (08/08 0909) BP: 147/101 (08/08 0909) Pulse Rate: 72 (08/08 0909)  Labs: Recent Labs    09/14/20 0247 09/15/20 0509 09/16/20 0401  HGB 14.9 13.5 13.4  HCT 46.4 40.5 41.1  PLT 327 281 324  HEPARINUNFRC 0.34 0.44 0.36  CREATININE 0.93  --   --    Estimated Creatinine Clearance: 90.5 mL/min (by C-G formula based on SCr of 0.93 mg/dL).  Medical History: Past Medical History:  Diagnosis Date   Medical history non-contributory     Assessment: 60 yr old man presented to [facility] ED on 09/11/20 with L-sided weakness; head CT showed R nonhemorrhagic cerebellar stroke (pt out of window for tPA) and thrombus along ascending thoracic aorta (likely the etiology of embolic infarction). Pt was started on heparin infusion at [facility]. He was transferred to [facility] for further evaluation. Pt was on no anticoagulants [facility].  Heparin level remains therapeutic on heparin at 1300 units/hr. CBC stable.   Goal of Therapy:  Heparin level: 0.3-0.5 units/ml (stroke protocol, no boluses due to recent stroke) Monitor platelets by anticoagulation protocol: Yes   Plan:  Continue heparin infusion at 1300 units/hr Monitor daily heparin level, CBC Monitor for bleeding Noted Stroke team recommendation for eventual transition to Eliquis for 3-6 months.    Benetta Spar, PharmD, BCPS, BCCP Clinical Pharmacist  Please check AMION for all [place] phone numbers After 10:00 PM, call [place] [phone]

## 2020-09-16 NOTE — Progress Notes (Signed)
Brief Nutrition Note  RD attempted to visit patient to follow up on diabetes diet education. Pt not in room at time of RD visit.  Pt was educated on previous visit with RD.  This RD will place outpatient RD consult for outpatient diabetes education for new diabetes diagnosis.  Unit RD will continue to follow and attempt to follow up on education again.  Derrel Nip, RD, LDN (she/her/hers) Registered Dietitian I After-Hours/Weekend Pager # in [phone]

## 2020-09-16 NOTE — Progress Notes (Signed)
Inpatient Diabetes Program Recommendations  AACE/ADA: New Consensus Statement on Inpatient Glycemic Control (2015)  Target Ranges:  Prepandial:   less than 140 mg/dL      Peak postprandial:   less than 180 mg/dL (1-2 hours)      Critically ill patients:  140 - 180 mg/dL   Lab Results  Component Value Date   GLUCAP 115 (H) 09/12/2020   HGBA1C 6.5 (H) 09/12/2020    Review of Glycemic Control  Diabetes history: New diagnosis this admission  Current orders for Inpatient glycemic control:  None  Spoke with pt at bedside regarding new diabetes diagnosis. Discussed A1c results 6.5%. Discussed glucose and A1c goals. Mentioned to pt about following up with his PCP. Explained some PCP's may want to start medications and monitoring while others will just allow lifestyle changes at this level of A1c. Explained lifestyle modifications through diet and exercise. Explained the carbohydrate counting document left behind from Dietitian while pt in CT scan. Pt has support system at home with family members who are knowledgeable.   Thanks,  Tama Headings RN, MSN, BC-ADM Inpatient Diabetes Coordinator Team Pager [phone] (8a-5p)

## 2020-09-16 NOTE — Progress Notes (Signed)
Physical Therapy Treatment Patient Details Name: Malik Jones MRN: [id number] DOB: 16-Oct-1960 Today's Date: 09/16/2020    History of Present Illness 60 yo male admitted to [facility] on 09/11/20 for evaluation of unsteadiness, R sided weakness, and L sided numbness. Speech is mildly dysarthric. 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 Pt without significant PMH.    PT Comments    Pt progressing towards physical therapy goals. Was able to perform transfers and ambulation with up to mod assist (more frequent losses of balance with fatigue). Pt motivated to participate with therapy and states he is already ready to go back to work (joking he has a hard time being still). Continue to feel this patient will benefit from the increased intensity of a CIR level rehab to maximize functional independence and safety prior to return home with family support. Will continue to follow.    Follow Up Recommendations  CIR;Supervision/Assistance - 24 hour     Equipment Recommendations  Rolling walker with 5" wheels;3in1 (PT)    Recommendations for Other Services Rehab consult     Precautions / Restrictions Precautions Precautions: Fall Precaution Comments: ataxia Restrictions Weight Bearing Restrictions: No    Mobility  Bed Mobility Overal bed mobility: Needs Assistance Bed Mobility: Supine to Sit     Supine to sit: Supervision     General bed mobility comments: Pt transitioned into long sitting and then was able to swing LE's around to sit EOB. Pt with increased effort to initiate.    Transfers Overall transfer level: Needs assistance Equipment used: Rolling walker (2 wheeled) Transfers: Sit to/from Stand Sit to Stand: Min guard         General transfer comment: Hands-on guarding for safety as pt powered up to full stand. Was able to take his hands off the walker to use the urinal. Increased sway  but no LOB.  Ambulation/Gait Ambulation/Gait assistance: Mod assist Gait Distance (Feet): 200 Feet Assistive device: Rolling walker (2 wheeled) Gait Pattern/deviations: Step-through pattern;Decreased stride length;Ataxic;Trunk flexed Gait velocity: decreased Gait velocity interpretation: <1.31 ft/sec, indicative of household ambulator General Gait Details: LOB x 4-5 during ambulation (more with fatigue) with modA to recover. MinA for balance overall. No standing rest breaks required this session. Patient with forceful placement of R LE at times, cues for control and soft placement and pt able to make corrective changes, however unable to maintain.   Stairs             Wheelchair Mobility    Modified Rankin (Stroke Patients Only) Modified Rankin (Stroke Patients Only) Pre-Morbid Rankin Score: No symptoms Modified Rankin: Moderately severe disability     Balance Overall balance assessment: Needs assistance Sitting-balance support: No upper extremity supported;Feet supported Sitting balance-Leahy Scale: Fair     Standing balance support: Bilateral upper extremity supported;No upper extremity supported;During functional activity Standing balance-Leahy Scale: Poor Standing balance comment: Reliant on UE support during dynamic standing activity.                            Cognition Arousal/Alertness: Awake/alert Behavior During Therapy: WFL for tasks assessed/performed;Flat affect Overall Cognitive Status: Impaired/Different from baseline Area of Impairment: Safety/judgement;Problem solving                         Safety/Judgement: Decreased awareness of safety;Decreased awareness of deficits   Problem Solving: Requires verbal cues  Exercises      General Comments General comments (skin integrity, edema, etc.): VSS      Pertinent Vitals/Pain Pain Assessment: No/denies pain Faces Pain Scale: No hurt    Home Living                       Prior Function            PT Goals (current goals can now be found in the care plan section) Acute Rehab PT Goals Patient Stated Goal: Get back to work PT Goal Formulation: With patient/family Time For Goal Achievement: 09/27/20 Potential to Achieve Goals: Good Progress towards PT goals: Progressing toward goals    Frequency    Min 4X/week      PT Plan Current plan remains appropriate    Co-evaluation              AM-PAC PT "6 Clicks" Mobility   Outcome Measure  Help needed turning from your back to your side while in a flat bed without using bedrails?: A Little Help needed moving from lying on your back to sitting on the side of a flat bed without using bedrails?: A Little Help needed moving to and from a bed to a chair (including a wheelchair)?: A Lot Help needed standing up from a chair using your arms (e.g., wheelchair or bedside chair)?: A Little Help needed to walk in hospital room?: A Lot Help needed climbing 3-5 steps with a railing? : A Lot 6 Click Score: 15    End of Session Equipment Utilized During Treatment: Gait belt Activity Tolerance: Patient tolerated treatment well Patient left: Other (comment) (With OT) Nurse Communication: Mobility status PT Visit Diagnosis: Unsteadiness on feet (R26.81);Other abnormalities of gait and mobility (R26.89);Ataxic gait (R26.0);Difficulty in walking, not elsewhere classified (R26.2);Other symptoms and signs involving the nervous system [id number])     Time: [id number] PT Time Calculation (min) (ACUTE ONLY): 20 min  Charges:  $Gait Training: 8-22 mins                     Rolinda Roan, PT, DPT Acute Rehabilitation Services Pager: [phone] Office: [phone]    Thelma Comp 09/16/2020, 10:33 AM

## 2020-09-16 NOTE — TOC Benefit Eligibility Note (Signed)
Patient Teacher, English as a foreign language completed.    The patient is currently admitted and upon discharge could be taking Eliquis 5 mg.  The current 30 day co-pay is, $50.00.   The patient is insured through [employer], [place] Patient Advocate Specialist [place] Team Direct Number: [phone]  Fax: [phone]

## 2020-09-16 NOTE — Progress Notes (Signed)
PROGRESS NOTE    Malik Jones  [id number] DOB: 1961/01/19 DOA: 09/12/2020 PCP: Ellene Route    Brief Narrative: Patient is a 60 year old male with no significant past medical history who presented with [facility] with dizziness, falls, discoordination, numbness on the left side.  He was found to have acute cerebellar infarct.  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  Vascular surgery was consulted and he was transferred here.  Neurology was consulted after he presented at [facility].  Stroke work-up initiated.  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  Currently on heparin drip.  Plan for CT aortic angiogram today.  Assessment & Plan:   Principal Problem:   Cerebellar stroke [facility]) Active Problems:   Aortic thrombus (HCC)   Impaired fasting glucose   Malnutrition of moderate degree   Acute cerebellar stroke: Presented with dizziness, falls, discoordination.  Likely from aortic thrombus with embolism.  Currently on heparin drip, statin.  Neurology was following, now signed off.  We will change heparin drip to Eliquis after CT surgery recommendation about the need of further intervention. Vascular surgery was also consulted. Continue Lipitor  Aortic thrombus: CT surgery following.  We are checking CT aortic angiogram today.  Continue heparin drip  Hypertension: Currently on amlodipine.  Monitor blood pressure  Diabetes type 2: A1c of 6.5.  Will consider metformin on discharge.  Debility/deconditioning: PT/OT recommended CIR on discharge.  TOC following     Nutrition Problem: Moderate Malnutrition Etiology: acute illness (stroke)      DVT prophylaxis:Heparin IV Code Status: Full Family Communication: None at bedside Status is: Inpatient  Remains inpatient appropriate because:Unsafe d/c plan  Dispo:  The patient is from: Home              Anticipated d/c is to: CIR              Patient currently is not medically stable to d/c.   Difficult to place patient No     Consultants: Neurology, vascular surgery, CT surgery  Procedures: None  Antimicrobials:  Anti-infectives (From admission, onward)    None       Subjective:  Patient seen and examined the bedside this afternoon.  Hemodynamically stable.  Sitting on the chair, denies any complaints,comfortale   Objective: Vitals:   09/15/20 2310 09/16/20 0310 09/16/20 0909 09/16/20 0956  BP: (!) 174/92 (!) 147/99 (!) 147/101 (!) 167/84  Pulse: (!) 58 60 72   Resp: '12 16 15   '$ Temp: 99 F (37.2 C)  98.6 F (37 C)   TempSrc: Oral  Oral   SpO2: 100% 98% 99%     Intake/Output Summary (Last 24 hours) at 09/16/2020 1135 Last data filed at 09/16/2020 0450 Gross per 24 hour  Intake --  Output 1000 ml  Net -1000 ml   There were no vitals filed for this visit.  Examination:  General exam: Overall comfortable, not in distress HEENT: PERRL Respiratory system:  no wheezes or crackles  Cardiovascular system: S1 & S2 heard, RRR.  Gastrointestinal system: Abdomen is nondistended, soft and nontender. Central nervous system: Alert and oriented Extremities: No edema, no clubbing ,no cyanosis Skin: No rashes, no ulcers,no icterus      Data Reviewed: I have personally reviewed following labs and imaging studies  CBC: Recent Labs  Lab 09/12/20 0930 09/13/20 0146 09/14/20 0247 09/15/20 0509 09/16/20 0401  WBC 8.4 5.9 5.7 4.5 4.2  NEUTROABS  6.5  --   --   --   --   HGB 16.3 15.3 14.9 13.5 13.4  HCT 48.2 46.2 46.4 40.5 41.1  MCV 93.6 94.5 96.1 95.3 95.4  PLT 373 398 327 281 0000000   Basic Metabolic Panel: Recent Labs  Lab 09/11/20 0939 09/12/20 0930 09/13/20 0146 09/14/20 0247  NA 133* 136 134* 136  K 3.8 3.8 3.6 4.1  CL 96* 100 101 106  CO2 '22 24 23 22  '$ GLUCOSE 186* 139* 118* 109*  BUN '12 14 14 13  '$ CREATININE 0.90  0.86 0.99 0.93  CALCIUM 9.7 10.0 9.5 9.3  MG  --   --  2.4 2.1  PHOS  --   --   --  3.7   GFR: Estimated Creatinine Clearance: 90.5 mL/min (by C-G formula based on SCr of 0.93 mg/dL). Liver Function Tests: No results for input(s): AST, ALT, ALKPHOS, BILITOT, PROT, ALBUMIN in the last 168 hours. No results for input(s): LIPASE, AMYLASE in the last 168 hours. No results for input(s): AMMONIA in the last 168 hours. Coagulation Profile: Recent Labs  Lab 09/11/20 0939  INR 1.0   Cardiac Enzymes: No results for input(s): CKTOTAL, CKMB, CKMBINDEX, TROPONINI in the last 168 hours. BNP (last 3 results) No results for input(s): PROBNP in the last 8760 hours. HbA1C: No results for input(s): HGBA1C in the last 72 hours. CBG: Recent Labs  Lab 09/12/20 0732  GLUCAP 115*   Lipid Profile: No results for input(s): CHOL, HDL, LDLCALC, TRIG, CHOLHDL, LDLDIRECT in the last 72 hours. Thyroid Function Tests: No results for input(s): TSH, T4TOTAL, FREET4, T3FREE, THYROIDAB in the last 72 hours. Anemia Panel: No results for input(s): VITAMINB12, FOLATE, FERRITIN, TIBC, IRON, RETICCTPCT in the last 72 hours. Sepsis Labs: No results for input(s): PROCALCITON, LATICACIDVEN in the last 168 hours.  Recent Results (from the past 240 hour(s))  Resp Panel by RT-PCR (Flu A&B, Covid) Nasopharyngeal Swab     Status: None   Collection Time: 09/11/20 12:08 PM   Specimen: Nasopharyngeal Swab; Nasopharyngeal(NP) swabs in vial transport medium  Result Value Ref Range Status   SARS Coronavirus 2 by RT PCR NEGATIVE NEGATIVE Final    Comment: (NOTE) SARS-CoV-2 target nucleic acids are NOT DETECTED.  The SARS-CoV-2 RNA is generally detectable in upper respiratory specimens during the acute phase of infection. The lowest concentration of SARS-CoV-2 viral copies this assay can detect is 138 copies/mL. A negative result does not preclude SARS-Cov-2 infection and should not be used as the sole basis for treatment  or other patient management decisions. A negative result may occur with  improper specimen collection/handling, submission of specimen other than nasopharyngeal swab, presence of viral mutation(s) within the areas targeted by this assay, and inadequate number of viral copies(<138 copies/mL). A negative result must be combined with clinical observations, patient history, and epidemiological information. The expected result is Negative.  Fact Sheet for Patients:  EntrepreneurPulse.com.au  Fact Sheet for Healthcare Providers:  IncredibleEmployment.be  This test is no t yet approved or cleared by the [place] FDA and  has been authorized for detection and/or diagnosis of SARS-CoV-2 by FDA under an Emergency Use Authorization (EUA). This EUA will remain  in effect (meaning this test can be used) for the duration of the COVID-19 declaration under Section 564(b)(1) of the Act, 21 U.S.C.section 360bbb-3(b)(1), unless the authorization is terminated  or revoked sooner.       Influenza A by PCR NEGATIVE NEGATIVE Final   Influenza B by  PCR NEGATIVE NEGATIVE Final    Comment: (NOTE) The Xpert Xpress SARS-CoV-2/FLU/RSV plus assay is intended as an aid in the diagnosis of influenza from Nasopharyngeal swab specimens and should not be used as a sole basis for treatment. Nasal washings and aspirates are unacceptable for Xpert Xpress SARS-CoV-2/FLU/RSV testing.  Fact Sheet for Patients: EntrepreneurPulse.com.au  Fact Sheet for Healthcare Providers: IncredibleEmployment.be  This test is not yet approved or cleared by the [place] FDA and has been authorized for detection and/or diagnosis of SARS-CoV-2 by FDA under an Emergency Use Authorization (EUA). This EUA will remain in effect (meaning this test can be used) for the duration of the COVID-19 declaration under Section 564(b)(1) of the Act, 21 U.S.C. section  360bbb-3(b)(1), unless the authorization is terminated or revoked.  Performed at [facility], [address], [place], [place] [address]          Radiology Studies: No results found.      Scheduled Meds:  amLODipine  2.5 mg Oral Daily   atorvastatin  40 mg Oral Daily   feeding supplement (GLUCERNA SHAKE)  237 mL Oral TID BM   multivitamin with minerals  1 tablet Oral Daily   Continuous Infusions:  sodium chloride 100 mL/hr at 09/16/20 0450   heparin 1,300 Units/hr (09/15/20 2152)     LOS: 4 days    Time spent: 25 mins.More than 50% of that time was spent in counseling and/or coordination of care.      Shelly Coss, MD [facility] P8/09/2020, 11:35 AM

## 2020-09-16 NOTE — Progress Notes (Signed)
Inpatient Rehab Admissions Coordinator:   I am following for potential admission. I have not heard from Pt.'s son regarding Pt.'s insurance card. I attempted to call Pt.'s primary care provider to see if they had a policy on file and they do not. I will continue to try and track down an insurance policy for this pt. As both Pt. And don are sure he has one.  Clemens Catholic, [place], [place] Admissions Coordinator  [phone] (Wel[place]) [phone] (office)

## 2020-09-17 LAB — CBC
HCT: 42.4 % (ref 39.0–52.0)
Hemoglobin: 14.1 g/dL (ref 13.0–17.0)
MCH: 31.4 pg (ref 26.0–34.0)
MCHC: 33.3 g/dL (ref 30.0–36.0)
MCV: 94.4 fL (ref 80.0–100.0)
Platelets: 318 K/uL (ref 150–400)
RBC: 4.49 MIL/uL (ref 4.22–5.81)
RDW: 12.9 % (ref 11.5–15.5)
WBC: 4.4 K/uL (ref 4.0–10.5)
nRBC: 0 % (ref 0.0–0.2)

## 2020-09-17 LAB — HEPARIN LEVEL (UNFRACTIONATED): Heparin Unfractionated: 0.34 [IU]/mL (ref 0.30–0.70)

## 2020-09-17 MED ORDER — APIXABAN 5 MG PO TABS
5.0000 mg | ORAL_TABLET | Freq: Two times a day (BID) | ORAL | Status: DC
  Administered 2020-09-17 – 2020-09-18 (×3): 5 mg via ORAL
  Filled 2020-09-17 (×3): qty 1

## 2020-09-17 MED ORDER — HYDROCHLOROTHIAZIDE 12.5 MG PO CAPS
12.5000 mg | ORAL_CAPSULE | Freq: Every day | ORAL | Status: DC
  Administered 2020-09-17 – 2020-09-18 (×2): 12.5 mg via ORAL
  Filled 2020-09-17 (×2): qty 1

## 2020-09-17 NOTE — Progress Notes (Signed)
PROGRESS NOTE    Malik Jones  [id number] DOB: 10-06-1960 DOA: 09/12/2020 PCP: Ellene Route    Brief Narrative: Patient is a 60 year old male with no significant past medical history who presented with [facility] with dizziness, falls, discoordination, numbness on the left side.  He was found to have acute cerebellar infarct.  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  Vascular surgery was consulted and he was transferred here.  Neurology was consulted after he presented at [facility].  Stroke work-up initiated and completed.  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  CT aortic angiogram did not show the previous seen adherent thoracic ascending aortic thrombus.  Heparin switched to Eliquis.  He is waiting for CIR bed.  Medically stable for discharge whenever possible.  Assessment & Plan:   Principal Problem:   Cerebellar stroke [facility]) Active Problems:   Aortic thrombus (HCC)   Impaired fasting glucose   Malnutrition of moderate degree   Acute cerebellar stroke: Presented with dizziness, falls, discoordination. 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  Stroke workup completed.  Neurology was following, now signed off.   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  CT aortic angiogram did not show the previous seen adherent thoracic ascending aortic thrombus.  Heparin switched to Eliquis  Abnormal CT angiogram of the chest:CT chest showed nodular consolidation cenetered in the bilateral upper lobes with reactive mediastinal lymphadenopathy suspected to have  infectious pneumonia.  Patient currently does not have any clinical signs of pneumonia.  We recommend follow-up CT chest in 2-3 months to ensure resolution  Hypertension: Currently on amlodipine.  Monitor blood pressure  Diabetes type 2: A1c of 6.5.  Will consider metformin on  discharge.  Debility/deconditioning: PT/OT recommended CIR on discharge.  TOC following     Nutrition Problem: Moderate Malnutrition Etiology: acute illness (stroke)      DVT prophylaxis:Heparin IV Code Status: Full Family Communication: None at bedside Status is: Inpatient  Remains inpatient appropriate because:Unsafe d/c plan  Dispo: The patient is from: Home              Anticipated d/c is to: CIR              Patient currently is not medically stable to d/c.   Difficult to place patient No     Consultants: Neurology, vascular surgery, CT surgery  Procedures: None  Antimicrobials:  Anti-infectives (From admission, onward)    None       Subjective:  Patient seen and examined the bedside this morning.  Hemodynamically stable.  Comfortable.  No new complaints   Objective: Vitals:   09/16/20 2349 09/16/20 2350 09/17/20 0338 09/17/20 0858  BP: (!) 166/99 (!) 166/99 (!) 151/100 (!) 143/95  Pulse:   75 76  Resp: '13 13 16 14  '$ Temp: 98.4 F (36.9 C) 98.4 F (36.9 C) 98.4 F (36.9 C) 98.5 F (36.9 C)  TempSrc: Oral Oral Oral Oral  SpO2:  99% 100% 98%    Intake/Output Summary (Last 24 hours) at 09/17/2020 1218 Last data filed at 09/17/2020 0857 Gross per 24 hour  Intake 925.99 ml  Output 3000 ml  Net -2074.01 ml   There were no vitals filed for this visit.  Examination:  General exam: Overall comfortable, not in distress HEENT: PERRL Respiratory system:  no wheezes or crackles  Cardiovascular system: S1 & S2 heard, RRR.  Gastrointestinal system: Abdomen is nondistended, soft and  nontender. Central nervous system: Alert and oriented Extremities: No edema, no clubbing ,no cyanosis Skin: No rashes, no ulcers,no icterus      Data Reviewed: I have personally reviewed following labs and imaging studies  CBC: Recent Labs  Lab 09/12/20 0930 09/13/20 0146 09/14/20 0247 09/15/20 0509 09/16/20 0401 09/17/20 0411  WBC 8.4 5.9 5.7 4.5 4.2 4.4   NEUTROABS 6.5  --   --   --   --   --   HGB 16.3 15.3 14.9 13.5 13.4 14.1  HCT 48.2 46.2 46.4 40.5 41.1 42.4  MCV 93.6 94.5 96.1 95.3 95.4 94.4  PLT 373 398 327 281 324 0000000   Basic Metabolic Panel: Recent Labs  Lab 09/11/20 0939 09/12/20 0930 09/13/20 0146 09/14/20 0247  NA 133* 136 134* 136  K 3.8 3.8 3.6 4.1  CL 96* 100 101 106  CO2 '22 24 23 22  '$ GLUCOSE 186* 139* 118* 109*  BUN '12 14 14 13  '$ CREATININE 0.90 0.86 0.99 0.93  CALCIUM 9.7 10.0 9.5 9.3  MG  --   --  2.4 2.1  PHOS  --   --   --  3.7   GFR: Estimated Creatinine Clearance: 90.5 mL/min (by C-G formula based on SCr of 0.93 mg/dL). Liver Function Tests: No results for input(s): AST, ALT, ALKPHOS, BILITOT, PROT, ALBUMIN in the last 168 hours. No results for input(s): LIPASE, AMYLASE in the last 168 hours. No results for input(s): AMMONIA in the last 168 hours. Coagulation Profile: Recent Labs  Lab 09/11/20 0939  INR 1.0   Cardiac Enzymes: No results for input(s): CKTOTAL, CKMB, CKMBINDEX, TROPONINI in the last 168 hours. BNP (last 3 results) No results for input(s): PROBNP in the last 8760 hours. HbA1C: No results for input(s): HGBA1C in the last 72 hours. CBG: Recent Labs  Lab 09/12/20 0732  GLUCAP 115*   Lipid Profile: No results for input(s): CHOL, HDL, LDLCALC, TRIG, CHOLHDL, LDLDIRECT in the last 72 hours. Thyroid Function Tests: No results for input(s): TSH, T4TOTAL, FREET4, T3FREE, THYROIDAB in the last 72 hours. Anemia Panel: No results for input(s): VITAMINB12, FOLATE, FERRITIN, TIBC, IRON, RETICCTPCT in the last 72 hours. Sepsis Labs: No results for input(s): PROCALCITON, LATICACIDVEN in the last 168 hours.  Recent Results (from the past 240 hour(s))  Resp Panel by RT-PCR (Flu A&B, Covid) Nasopharyngeal Swab     Status: None   Collection Time: 09/11/20 12:08 PM   Specimen: Nasopharyngeal Swab; Nasopharyngeal(NP) swabs in vial transport medium  Result Value Ref Range Status   SARS  Coronavirus 2 by RT PCR NEGATIVE NEGATIVE Final    Comment: (NOTE) SARS-CoV-2 target nucleic acids are NOT DETECTED.  The SARS-CoV-2 RNA is generally detectable in upper respiratory specimens during the acute phase of infection. The lowest concentration of SARS-CoV-2 viral copies this assay can detect is 138 copies/mL. A negative result does not preclude SARS-Cov-2 infection and should not be used as the sole basis for treatment or other patient management decisions. A negative result may occur with  improper specimen collection/handling, submission of specimen other than nasopharyngeal swab, presence of viral mutation(s) within the areas targeted by this assay, and inadequate number of viral copies(<138 copies/mL). A negative result must be combined with clinical observations, patient history, and epidemiological information. The expected result is Negative.  Fact Sheet for Patients:  EntrepreneurPulse.com.au  Fact Sheet for Healthcare Providers:  IncredibleEmployment.be  This test is no t yet approved or cleared by the [place] FDA and  has been  authorized for detection and/or diagnosis of SARS-CoV-2 by FDA under an Emergency Use Authorization (EUA). This EUA will remain  in effect (meaning this test can be used) for the duration of the COVID-19 declaration under Section 564(b)(1) of the Act, 21 U.S.C.section 360bbb-3(b)(1), unless the authorization is terminated  or revoked sooner.       Influenza A by PCR NEGATIVE NEGATIVE Final   Influenza B by PCR NEGATIVE NEGATIVE Final    Comment: (NOTE) The Xpert Xpress SARS-CoV-2/FLU/RSV plus assay is intended as an aid in the diagnosis of influenza from Nasopharyngeal swab specimens and should not be used as a sole basis for treatment. Nasal washings and aspirates are unacceptable for Xpert Xpress SARS-CoV-2/FLU/RSV testing.  Fact Sheet for  Patients: EntrepreneurPulse.com.au  Fact Sheet for Healthcare Providers: IncredibleEmployment.be  This test is not yet approved or cleared by the [place] FDA and has been authorized for detection and/or diagnosis of SARS-CoV-2 by FDA under an Emergency Use Authorization (EUA). This EUA will remain in effect (meaning this test can be used) for the duration of the COVID-19 declaration under Section 564(b)(1) of the Act, 21 U.S.C. section 360bbb-3(b)(1), unless the authorization is terminated or revoked.  Performed at [facility], [address] [place], [place] [address]          Radiology Studies: CT ANGIO CHEST AORTA W/CM & OR WO/CM  Result Date: 09/16/2020 CLINICAL DATA:  History of aortic thrombus, stroke EXAM: CT ANGIOGRAPHY CHEST WITH CONTRAST TECHNIQUE: Multidetector CT imaging of the chest was performed using the standard protocol during bolus administration of intravenous contrast. Multiplanar CT image reconstructions and MIPs were obtained to evaluate the vascular anatomy. CONTRAST:  167m OMNIPAQUE IOHEXOL 350 MG/ML SOLN COMPARISON:  September 11, 2020 FINDINGS: Cardiovascular: Preferential opacification of the thoracic aorta. No evidence of thoracic aortic aneurysm or dissection. Specifically, no evidence of intraluminal thrombus or other filling defect in the thoracic aorta. The previously identified wall adherent thrombus in the anterior wall of the ascending thoracic aorta is no longer visualized. Normal heart size. No pericardial effusion. No intracardiac thrombus identified. Mediastinum/Nodes: Prominent mediastinal lymph nodes are favored reactive. The thyroid gland appears normal. Lungs/Pleura: No pleural effusion. No pneumothorax. Nodular consolidation in the bilateral upper lobes. Musculoskeletal: No aggressive osseous lesions. Upper abdomen: There is an approximately 1 cm enhancing lesion in segment 2 of the liver, which is  favored represent a small flash filling hemangioma. The visualized upper abdomen is otherwise unremarkable. Review of the MIP images confirms the above findings. IMPRESSION: 1. The previously identified wall adherent thrombus in the anterior wall of the ascending thoracic aorta is no longer visualized. No other filling defects identified in the thoracic aorta or within the heart. 2. Nodular consolidation cenetered in the bilateral upper lobes with reactive mediastinal lymphadenopathy is most likely infectious pneumonia. Consider follow-up CT chest in 2-3 months to ensure resolution. Electronically Signed   By: YAlbin FellingMD   On: 09/16/2020 14:49        Scheduled Meds:  amLODipine  10 mg Oral Daily   apixaban  5 mg Oral BID   atorvastatin  40 mg Oral Daily   feeding supplement (GLUCERNA SHAKE)  237 mL Oral TID BM   hydrochlorothiazide  12.5 mg Oral Daily   multivitamin with minerals  1 tablet Oral Daily   Continuous Infusions:     LOS: 5 days    Time spent: 25 mins.More than 50% of that time was spent in counseling and/or coordination of care.  Shelly Coss, MD [facility] P8/10/2020, 12:18 PM

## 2020-09-17 NOTE — Progress Notes (Signed)
Physical Therapy Treatment Patient Details Name: Malik Jones MRN: [id number] DOB: 05-02-1960 Today's Date: 09/17/2020    History of Present Illness 60 yo male admitted to [facility] on 09/11/20 for evaluation of unsteadiness, R sided weakness, and L sided numbness. Speech is mildly dysarthric. 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 Pt without significant PMH.    PT Comments    Pt reports not sleeping well last night and is tired, but motivated to progress mobility. Pt overall requiring min assist for bed mobility, repeated transfers, and x2 bouts of short hallway ambulation, PT focusing on RLE coordination and balance throughout session. Pt benefits from external cuing (I.e. using tile on floor to adjust step width, using visual inputs to place RLE where he intends to on step) for improving RLE coordination, worsening incoordination with fatigue. PT to continue to follow acutely.    Follow Up Recommendations  CIR;Supervision/Assistance - 24 hour     Equipment Recommendations  Rolling walker with 5" wheels;3in1 (PT)    Recommendations for Other Services Rehab consult     Precautions / Restrictions Precautions Precautions: Fall Precaution Comments: ataxia Restrictions Weight Bearing Restrictions: No    Mobility  Bed Mobility Overal bed mobility: Needs Assistance Bed Mobility: Supine to Sit;Sit to Supine     Supine to sit: Supervision;HOB elevated Sit to supine: Supervision;HOB elevated   General bed mobility comments: for safety, increased time and use of bedrails    Transfers Overall transfer level: Needs assistance Equipment used: Rolling walker (2 wheeled) Transfers: Sit to/from Stand Sit to Stand: Min assist         General transfer comment: min assist to steady upon standing, verbal cuing for hand placement when rising/sitting each time, STS x3 from EOB and chair in rehab  gym x2.  Ambulation/Gait Ambulation/Gait assistance: Min assist Gait Distance (Feet): 80 Feet (x2 - to and from rehab gym) Assistive device: Rolling walker (2 wheeled) Gait Pattern/deviations: Step-through pattern;Decreased stride length;Ataxic;Trunk flexed Gait velocity: decr   General Gait Details: min assist to steady, guide RW and pt. PT cuing for use of floor tile as external cue for step width and control over RLE, upright posture.   Stairs Stairs: Yes Stairs assistance: Min assist Stair Management: Two rails;Step to pattern;Forwards Number of Stairs: 1 (x20, as intervention)     Wheelchair Mobility    Modified Rankin (Stroke Patients Only) Modified Rankin (Stroke Patients Only) Pre-Morbid Rankin Score: No symptoms Modified Rankin: Moderately severe disability     Balance Overall balance assessment: Needs assistance Sitting-balance support: No upper extremity supported;Feet supported Sitting balance-Leahy Scale: Fair     Standing balance support: Bilateral upper extremity supported;No upper extremity supported;During functional activity Standing balance-Leahy Scale: Poor Standing balance comment: Reliant on UE support during dynamic standing activity.                            Cognition Arousal/Alertness: Awake/alert Behavior During Therapy: WFL for tasks assessed/performed;Flat affect Overall Cognitive Status: Impaired/Different from baseline Area of Impairment: Safety/judgement;Problem solving                         Safety/Judgement: Decreased awareness of safety;Decreased awareness of deficits   Problem Solving: Requires verbal cues        Exercises Other Exercises Other Exercises: step ups, x10 with RLE leading and then with LLE leading, bilat UE support on railings,  cues for motor grading RLE. Other Exercises: step in and out of "box" (paper placed on floor), for LE coordination and balance    General Comments         Pertinent Vitals/Pain Pain Assessment: No/denies pain    Home Living                      Prior Function            PT Goals (current goals can now be found in the care plan section) Acute Rehab PT Goals Patient Stated Goal: Get back to work PT Goal Formulation: With patient/family Time For Goal Achievement: 09/27/20 Potential to Achieve Goals: Good Progress towards PT goals: Progressing toward goals    Frequency    Min 4X/week      PT Plan Current plan remains appropriate    Co-evaluation              AM-PAC PT "6 Clicks" Mobility   Outcome Measure  Help needed turning from your back to your side while in a flat bed without using bedrails?: A Little Help needed moving from lying on your back to sitting on the side of a flat bed without using bedrails?: A Little Help needed moving to and from a bed to a chair (including a wheelchair)?: A Little Help needed standing up from a chair using your arms (e.g., wheelchair or bedside chair)?: A Little Help needed to walk in hospital room?: A Little Help needed climbing 3-5 steps with a railing? : A Lot 6 Click Score: 17    End of Session Equipment Utilized During Treatment: Gait belt Activity Tolerance: Patient tolerated treatment well Patient left: in bed;with call bell/phone within reach;with bed alarm set Nurse Communication: Mobility status PT Visit Diagnosis: Unsteadiness on feet (R26.81);Other abnormalities of gait and mobility (R26.89);Ataxic gait (R26.0);Difficulty in walking, not elsewhere classified (R26.2);Other symptoms and signs involving the nervous system (R29.898)     Time: [id number] PT Time Calculation (min) (ACUTE ONLY): 27 min  Charges:  $Gait Training: 8-22 mins $Therapeutic Exercise: 8-22 mins                     Stacie Glaze, PT DPT Acute Rehabilitation Services Pager [phone]  Office [phone]    [place] 09/17/2020, 12:36 PM

## 2020-09-17 NOTE — Progress Notes (Signed)
ANTICOAGULATION CONSULT NOTE - Follow Up Consult  Pharmacy Consult for Heparin to Apixaban Indication:  stroke and aortic thrombus  No Known Allergies  Patient Measurements:   Heparin Dosing Weight: 73.9 kg  Vital Signs: Temp: 98.4 F (36.9 C) (08/09 0338) Temp Source: Oral (08/09 0338) BP: 151/100 (08/09 0338) Pulse Rate: 75 (08/09 0338)  Labs: Recent Labs    09/15/20 0509 09/16/20 0401 09/17/20 0411  HGB 13.5 13.4 14.1  HCT 40.5 41.1 42.4  PLT 281 324 318  HEPARINUNFRC 0.44 0.36 0.34    Estimated Creatinine Clearance: 90.5 mL/min (by C-G formula based on SCr of 0.93 mg/dL).    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 Pt was started on heparin infusion at [facility]. He was transferred to [facility] for further evaluation. Pt was on no anticoagulants PTA to [facility].   Heparin level remains therapeutic. Team consulted to switch to apixaban. Good renal function, age, weight appropriate for 5 mg BID.   Goal of Therapy:    Heparin Level: 0.3-0.5 units/mL (stroke protocol, no boluses due to recent stroke) Monitor platelets by anticoagulation protocol: Yes   Plan:  D/c heparin Start apixaban 5 mg BID Monitor for bleeding  Lovena [place], Stage manager

## 2020-09-17 NOTE — Plan of Care (Signed)

## 2020-09-17 NOTE — Progress Notes (Signed)
Inpatient Rehab Admissions Coordinator:   I spoke with pt.'s son and he states that he is going to look for Pt.'s insurance card at his house today. He states he will call me if he finds it.   Clemens Catholic, [place], [place] Admissions Coordinator  [phone] ([place]) [phone] (office)

## 2020-09-18 ENCOUNTER — Other Ambulatory Visit: Payer: Self-pay

## 2020-09-18 ENCOUNTER — Encounter (HOSPITAL_COMMUNITY): Payer: Self-pay | Admitting: Internal Medicine

## 2020-09-18 ENCOUNTER — Encounter (HOSPITAL_COMMUNITY): Payer: Self-pay | Admitting: Physical Medicine and Rehabilitation

## 2020-09-18 ENCOUNTER — Inpatient Hospital Stay (HOSPITAL_COMMUNITY)
Admission: RE | Admit: 2020-09-18 | Discharge: 2020-09-26 | DRG: 057 | Disposition: A | Discharge status: Home / Self Care | Payer: BC Managed Care – PPO | Source: Intra-hospital | Attending: Physical Medicine and Rehabilitation | Admitting: Physical Medicine and Rehabilitation

## 2020-09-18 DIAGNOSIS — R590 Localized enlarged lymph nodes: Secondary | ICD-10-CM | POA: Diagnosis not present

## 2020-09-18 DIAGNOSIS — I152 Hypertension secondary to endocrine disorders: Secondary | ICD-10-CM | POA: Diagnosis present

## 2020-09-18 DIAGNOSIS — E559 Vitamin D deficiency, unspecified: Secondary | ICD-10-CM | POA: Diagnosis not present

## 2020-09-18 DIAGNOSIS — I10 Essential (primary) hypertension: Secondary | ICD-10-CM | POA: Diagnosis present

## 2020-09-18 DIAGNOSIS — I69318 Other symptoms and signs involving cognitive functions following cerebral infarction: Secondary | ICD-10-CM | POA: Diagnosis not present

## 2020-09-18 DIAGNOSIS — Z8673 Personal history of transient ischemic attack (TIA), and cerebral infarction without residual deficits: Secondary | ICD-10-CM | POA: Diagnosis present

## 2020-09-18 DIAGNOSIS — Z23 Encounter for immunization: Secondary | ICD-10-CM | POA: Diagnosis not present

## 2020-09-18 DIAGNOSIS — I69322 Dysarthria following cerebral infarction: Secondary | ICD-10-CM | POA: Diagnosis not present

## 2020-09-18 DIAGNOSIS — I69351 Hemiplegia and hemiparesis following cerebral infarction affecting right dominant side: Principal | ICD-10-CM

## 2020-09-18 DIAGNOSIS — E1159 Type 2 diabetes mellitus with other circulatory complications: Secondary | ICD-10-CM | POA: Diagnosis present

## 2020-09-18 DIAGNOSIS — R7401 Elevation of levels of liver transaminase levels: Secondary | ICD-10-CM

## 2020-09-18 DIAGNOSIS — I639 Cerebral infarction, unspecified: Secondary | ICD-10-CM | POA: Diagnosis not present

## 2020-09-18 DIAGNOSIS — E871 Hypo-osmolality and hyponatremia: Secondary | ICD-10-CM

## 2020-09-18 DIAGNOSIS — H5509 Other forms of nystagmus: Secondary | ICD-10-CM | POA: Diagnosis present

## 2020-09-18 DIAGNOSIS — E119 Type 2 diabetes mellitus without complications: Secondary | ICD-10-CM

## 2020-09-18 DIAGNOSIS — I69393 Ataxia following cerebral infarction: Secondary | ICD-10-CM

## 2020-09-18 DIAGNOSIS — Z841 Family history of disorders of kidney and ureter: Secondary | ICD-10-CM

## 2020-09-18 DIAGNOSIS — I6300 Cerebral infarction due to thrombosis of unspecified precerebral artery: Secondary | ICD-10-CM | POA: Diagnosis not present

## 2020-09-18 LAB — CBC
HCT: 44.7 % (ref 39.0–52.0)
Hemoglobin: 15 g/dL (ref 13.0–17.0)
MCH: 31.3 pg (ref 26.0–34.0)
MCHC: 33.6 g/dL (ref 30.0–36.0)
MCV: 93.3 fL (ref 80.0–100.0)
Platelets: 355 10*3/uL (ref 150–400)
RBC: 4.79 MIL/uL (ref 4.22–5.81)
RDW: 13 % (ref 11.5–15.5)
WBC: 4.5 10*3/uL (ref 4.0–10.5)
nRBC: 0 % (ref 0.0–0.2)

## 2020-09-18 LAB — GLUCOSE, CAPILLARY: Glucose-Capillary: 118 mg/dL — ABNORMAL HIGH (ref 70–99)

## 2020-09-18 MED ORDER — TRAZODONE HCL 50 MG PO TABS: 25.0000 mg | ORAL_TABLET | Freq: Every evening | ORAL | Status: DC | PRN

## 2020-09-18 MED ORDER — GUAIFENESIN-DM 100-10 MG/5ML PO SYRP: 5.0000 mL | ORAL_SOLUTION | Freq: Four times a day (QID) | ORAL | Status: DC | PRN

## 2020-09-18 MED ORDER — INSULIN ASPART 100 UNIT/ML IJ SOLN: 0.0000 [IU] | Freq: Every day | INTRAMUSCULAR | Status: DC

## 2020-09-18 MED ORDER — HYDROCHLOROTHIAZIDE 12.5 MG PO CAPS
12.5000 mg | ORAL_CAPSULE | Freq: Every day | ORAL | Status: DC
  Administered 2020-09-19 – 2020-09-20 (×2): 12.5 mg via ORAL
  Filled 2020-09-18 (×2): qty 1

## 2020-09-18 MED ORDER — PROCHLORPERAZINE 25 MG RE SUPP: 12.5000 mg | Freq: Four times a day (QID) | RECTAL | Status: DC | PRN

## 2020-09-18 MED ORDER — APIXABAN 5 MG PO TABS
5.0000 mg | ORAL_TABLET | Freq: Two times a day (BID) | ORAL | Status: DC
  Administered 2020-09-18 – 2020-09-26 (×16): 5 mg via ORAL
  Filled 2020-09-18 (×16): qty 1

## 2020-09-18 MED ORDER — ADULT MULTIVITAMIN W/MINERALS CH: 1.0000 | ORAL_TABLET | Freq: Every day | ORAL | Status: DC

## 2020-09-18 MED ORDER — INSULIN ASPART 100 UNIT/ML IJ SOLN
0.0000 [IU] | Freq: Three times a day (TID) | INTRAMUSCULAR | Status: DC
  Administered 2020-09-20 – 2020-09-21 (×3): 1 [IU] via SUBCUTANEOUS
  Administered 2020-09-21: 2 [IU] via SUBCUTANEOUS
  Administered 2020-09-22 – 2020-09-23 (×2): 1 [IU] via SUBCUTANEOUS

## 2020-09-18 MED ORDER — BLOOD PRESSURE CONTROL BOOK
Freq: Once | Status: AC
  Filled 2020-09-18: qty 1

## 2020-09-18 MED ORDER — PNEUMOCOCCAL VAC POLYVALENT 25 MCG/0.5ML IJ INJ
0.5000 mL | INJECTION | INTRAMUSCULAR | Status: AC
  Administered 2020-09-20: 0.5 mL via INTRAMUSCULAR
  Filled 2020-09-18: qty 0.5

## 2020-09-18 MED ORDER — ATORVASTATIN CALCIUM 40 MG PO TABS: 40.0000 mg | ORAL_TABLET | Freq: Every day | ORAL | Status: DC

## 2020-09-18 MED ORDER — FLEET ENEMA 7-19 GM/118ML RE ENEM
1.0000 | ENEMA | Freq: Once | RECTAL | Status: DC | PRN
Start: 2020-09-18 — End: 2020-09-26

## 2020-09-18 MED ORDER — AMLODIPINE BESYLATE 10 MG PO TABS: 10.0000 mg | ORAL_TABLET | Freq: Every day | ORAL | Status: DC

## 2020-09-18 MED ORDER — LIVING WELL WITH DIABETES BOOK
Freq: Once | Status: AC
  Filled 2020-09-18: qty 1

## 2020-09-18 MED ORDER — METFORMIN HCL ER 500 MG PO TB24: 500.0000 mg | ORAL_TABLET | Freq: Every day | ORAL | 11 refills | Status: DC

## 2020-09-18 MED ORDER — SENNOSIDES-DOCUSATE SODIUM 8.6-50 MG PO TABS
1.0000 | ORAL_TABLET | Freq: Every evening | ORAL | Status: DC | PRN
  Filled 2020-09-18: qty 1

## 2020-09-18 MED ORDER — POLYETHYLENE GLYCOL 3350 17 G PO PACK
17.0000 g | PACK | Freq: Every day | ORAL | Status: DC | PRN
  Administered 2020-09-20: 17 g via ORAL
  Filled 2020-09-18: qty 1

## 2020-09-18 MED ORDER — ADULT MULTIVITAMIN W/MINERALS CH
1.0000 | ORAL_TABLET | Freq: Every day | ORAL | Status: DC
  Administered 2020-09-19 – 2020-09-26 (×8): 1 via ORAL
  Filled 2020-09-18 (×8): qty 1

## 2020-09-18 MED ORDER — BISACODYL 10 MG RE SUPP: 10.0000 mg | Freq: Every day | RECTAL | Status: DC | PRN

## 2020-09-18 MED ORDER — PROCHLORPERAZINE EDISYLATE 10 MG/2ML IJ SOLN: 5.0000 mg | Freq: Four times a day (QID) | INTRAMUSCULAR | Status: DC | PRN

## 2020-09-18 MED ORDER — AMLODIPINE BESYLATE 10 MG PO TABS
10.0000 mg | ORAL_TABLET | Freq: Every day | ORAL | Status: DC
  Administered 2020-09-19 – 2020-09-26 (×8): 10 mg via ORAL
  Filled 2020-09-18 (×8): qty 1

## 2020-09-18 MED ORDER — APIXABAN 5 MG PO TABS: 5.0000 mg | ORAL_TABLET | Freq: Two times a day (BID) | ORAL | Status: DC

## 2020-09-18 MED ORDER — HYDROCHLOROTHIAZIDE 12.5 MG PO CAPS: 12.5000 mg | ORAL_CAPSULE | Freq: Every day | ORAL | Status: DC

## 2020-09-18 MED ORDER — ATORVASTATIN CALCIUM 40 MG PO TABS
40.0000 mg | ORAL_TABLET | Freq: Every day | ORAL | Status: DC
  Administered 2020-09-19 – 2020-09-26 (×8): 40 mg via ORAL
  Filled 2020-09-18 (×8): qty 1

## 2020-09-18 MED ORDER — ALUM & MAG HYDROXIDE-SIMETH 200-200-20 MG/5ML PO SUSP: 30.0000 mL | ORAL | Status: DC | PRN

## 2020-09-18 MED ORDER — PROCHLORPERAZINE MALEATE 5 MG PO TABS: 5.0000 mg | ORAL_TABLET | Freq: Four times a day (QID) | ORAL | Status: DC | PRN

## 2020-09-18 MED ORDER — DIPHENHYDRAMINE HCL 12.5 MG/5ML PO ELIX: 12.5000 mg | ORAL_SOLUTION | Freq: Four times a day (QID) | ORAL | Status: DC | PRN

## 2020-09-18 MED ORDER — ACETAMINOPHEN 325 MG PO TABS: 325.0000 mg | ORAL_TABLET | ORAL | Status: DC | PRN

## 2020-09-18 NOTE — Progress Notes (Signed)
Physical Therapy Treatment Patient Details Name: Malik Jones MRN: [id number] DOB: [birth year] Today's Date: 09/18/2020    History of Present Illness 59 yo male admitted to [facility] on 09/11/20 for evaluation of unsteadiness, R sided weakness, and L sided numbness. Speech is mildly dysarthric. 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 Pt without significant PMH.    PT Comments    Pt demonstrating smoother, more controlled gait today with less significant ataxia RLE. Pt utilizing external cue of floor tile approach learned during therapy session yesterday. PT focused session on RLE coordination in dynamic standing tasks, tolerated well but ataxia worse with fatigue. PT to continue to follow acutely, remains an excellent CIR candidate.     Follow Up Recommendations  CIR;Supervision/Assistance - 24 hour     Equipment Recommendations  Rolling walker with 5" wheels;3in1 (PT)    Recommendations for Other Services       Precautions / Restrictions Precautions Precautions: Fall Precaution Comments: ataxia Restrictions Weight Bearing Restrictions: No    Mobility  Bed Mobility Overal bed mobility: Needs Assistance Bed Mobility: Supine to Sit     Supine to sit: Supervision     General bed mobility comments: for safety, increased time and use of bedrails    Transfers Overall transfer level: Needs assistance Equipment used: Rolling walker (2 wheeled) Transfers: Sit to/from Stand Sit to Stand: Min guard;Min assist         General transfer comment: min guard from elevated surface, light min assist for initial rise from lower surface. STS x1 from EOB, x6 from wheelchair in rehab gym as intervention.  Ambulation/Gait Ambulation/Gait assistance: Min assist Gait Distance (Feet): 80 Feet (x2 - to and from rehab gym) Assistive device: Rolling walker (2 wheeled) Gait Pattern/deviations:  Step-through pattern;Decreased stride length;Ataxic;Trunk flexed Gait velocity: decr   General Gait Details: min assist to steady, cues for widening BOS x2 and hallway navigation especially on R.   Stairs             Wheelchair Mobility    Modified Rankin (Stroke Patients Only) Modified Rankin (Stroke Patients Only) Pre-Morbid Rankin Score: No symptoms Modified Rankin: Moderately severe disability     Balance Overall balance assessment: Needs assistance Sitting-balance support: No upper extremity supported;Feet supported Sitting balance-Leahy Scale: Fair     Standing balance support: Bilateral upper extremity supported;No upper extremity supported;During functional activity Standing balance-Leahy Scale: Poor Standing balance comment: Reliant on UE support during dynamic standing activity.                            Cognition Arousal/Alertness: Awake/alert Behavior During Therapy: WFL for tasks assessed/performed;Flat affect Overall Cognitive Status: Impaired/Different from baseline Area of Impairment: Safety/judgement;Problem solving                         Safety/Judgement: Decreased awareness of safety;Decreased awareness of deficits   Problem Solving: Requires verbal cues        Exercises Other Exercises Other Exercises: box taps R foot x10, lateral step RLE onto step x10, diagonal step L and R with RLE x10 each direction Other Exercises: STS with RLE in rear of LLE, x5    General Comments General comments (skin integrity, edema, etc.): HRmax 137 bpm, during abdominal cramp      Pertinent Vitals/Pain Pain Assessment: Faces Faces Pain Scale: Hurts little more Pain Location: L abdomen Pain Descriptors /  Indicators: Spasm;Cramping Pain Intervention(s): Limited activity within patient's tolerance;Monitored during session;Repositioned    Home Living                      Prior Function            PT Goals (current goals  can now be found in the care plan section) Acute Rehab PT Goals Patient Stated Goal: Get back to work PT Goal Formulation: With patient/family Time For Goal Achievement: 09/27/20 Potential to Achieve Goals: Good Progress towards PT goals: Progressing toward goals    Frequency    Min 4X/week      PT Plan Current plan remains appropriate    Co-evaluation              AM-PAC PT "6 Clicks" Mobility   Outcome Measure  Help needed turning from your back to your side while in a flat bed without using bedrails?: A Little Help needed moving from lying on your back to sitting on the side of a flat bed without using bedrails?: A Little Help needed moving to and from a bed to a chair (including a wheelchair)?: A Little Help needed standing up from a chair using your arms (e.g., wheelchair or bedside chair)?: A Little Help needed to walk in hospital room?: A Little Help needed climbing 3-5 steps with a railing? : A Lot 6 Click Score: 17    End of Session Equipment Utilized During Treatment: Gait belt Activity Tolerance: Patient tolerated treatment well Patient left: with call bell/phone within reach;in chair;with chair alarm set Nurse Communication: Mobility status PT Visit Diagnosis: Unsteadiness on feet (R26.81);Other abnormalities of gait and mobility (R26.89);Ataxic gait (R26.0)     Time: [id number] PT Time Calculation (min) (ACUTE ONLY): 23 min  Charges:  $Gait Training: 8-22 mins $Neuromuscular Re-education: 8-22 mins                    Stacie Glaze, PT DPT Acute Rehabilitation Services Pager [phone]  Office [phone]   [place] 09/18/2020, 11:33 AM

## 2020-09-18 NOTE — Progress Notes (Signed)
Inpatient Rehab Admissions Coordinator:   I have a bed for this Pt. On CIR and plan to admit today. RN may call report to [phone] after 12pm.  Clemens Catholic, [place], [place] Admissions Coordinator  [phone] (celll) [phone] (office)

## 2020-09-18 NOTE — Discharge Summary (Signed)
Physician Discharge Summary  Naser Falardeau [id number] DOB: 1960/09/26 DOA: 09/12/2020  PCP: Ellene Route  Admit date: 09/12/2020 Discharge date: 09/18/2020  Admitted From: Home Disposition:  Home  Discharge Condition:Stable CODE STATUS:FULL Diet recommendation: Heart Healthy Brief/Interim Summary:  Patient is a 60 year old male with no significant past medical history who presented with [facility] with dizziness, falls, discoordination, numbness on the left side.  He was found to have acute cerebellar infarct.  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  Vascular surgery was consulted and he was transferred here.  Neurology was consulted after he presented at [facility].  Stroke work-up initiated and completed.  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  CT aortic angiogram did not show the previous seen adherent thoracic ascending aortic thrombus.  Heparin switched to Eliquis.  PT/OT recommended CIR on discharge.  Medically stable for discharge today.  Following problems were addressed during his hospitalization:  Acute cerebellar stroke: Presented with dizziness, falls, discoordination. 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  Stroke workup completed.  Neurology was following, now signed off.  We recommend to follow-up with [facility] neurology in 4 weeks.   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  CT aortic angiogram did not show the previous seen adherent thoracic ascending aortic thrombus.  Heparin switched to Eliquis   Abnormal CT angiogram of the chest:CT chest showed nodular consolidation cenetered in the bilateral upper lobes with reactive mediastinal lymphadenopathy suspected to have  infectious pneumonia.  Patient currently does not have any clinical signs of pneumonia.  We recommend follow-up CT chest in 2-3 months to ensure  resolution   Hypertension: Currently on amlodipine,HCTZ.  Monitor blood pressure   Diabetes type 2: A1c of 6.5.  Started on metformin on discharge.   Debility/deconditioning: PT/OT recommended CIR on discharge.   Discharge Diagnoses:  Principal Problem:   Cerebellar stroke [facility]) Active Problems:   Aortic thrombus (HCC)   Impaired fasting glucose   Malnutrition of moderate degree    Discharge Instructions  Discharge Instructions     Amb Referral to Nutrition and Diabetic Education   Complete by: As directed    Ambulatory referral to Neurology   Complete by: As directed    An appointment is requested in approximately: 2 weeks   Diet - low sodium heart healthy   Complete by: As directed    Discharge instructions   Complete by: As directed    1)Please take prescribed medications as instructed 2)Follow up with neurology in 4 weeks.  Name and number of the provider group has been attached 3)Do a noncontrast CT scan of the chest in 2-3 months to follow up on enlarged lymph nodes around lungs   Increase activity slowly   Complete by: As directed       Allergies as of 09/18/2020   No Known Allergies      Medication List     TAKE these medications    amLODipine 10 MG tablet Commonly known as: NORVASC Take 1 tablet (10 mg total) by mouth daily. Start taking on: September 19, 2020   apixaban 5 MG Tabs tablet Commonly known as: ELIQUIS Take 1 tablet (5 mg total) by mouth 2 (two) times daily.   atorvastatin 40 MG tablet Commonly known as: LIPITOR Take 1 tablet (40 mg total) by mouth daily. Start taking on: September 19, 2020   hydrochlorothiazide 12.5 MG capsule Commonly known as: MICROZIDE Take 1 capsule (  12.5 mg total) by mouth daily. Start taking on: September 19, 2020   ibuprofen 200 MG tablet Commonly known as: ADVIL Take 400 mg by mouth every 6 (six) hours as needed for mild pain.   metFORMIN 500 MG 24 hr tablet Commonly known as: Glucophage XR Take 1 tablet  (500 mg total) by mouth daily with breakfast.   multivitamin with minerals Tabs tablet Take 1 tablet by mouth daily. Start taking on: September 19, 2020        Follow-up Information     [facility]. Schedule an appointment as soon as possible for a visit in 4 week(s).   Specialty: Neurology Contact information: [address] [place] [phone]               No Known Allergies  Consultations: Neurology   Procedures/Studies: DG Chest 2 View  Result Date: 09/11/2020 CLINICAL DATA:  Pneumonia, confusion EXAM: CHEST - 2 VIEW COMPARISON:  None. FINDINGS: Cardiomegaly. Subtle heterogeneous airspace opacity of the peripheral right upper lobe. The visualized skeletal structures are unremarkable. IMPRESSION: 1. Subtle heterogeneous airspace opacity of the peripheral right upper lobe, concerning for infection. 2. Cardiomegaly. Electronically Signed   By: Eddie Candle M.D.   On: 09/11/2020 15:01   CT HEAD WO CONTRAST  Result Date: 09/11/2020 CLINICAL DATA:  Confusion began yesterday. Difficulty with thoughts. Tired. Numbness in the left upper extremity. EXAM: CT HEAD WITHOUT CONTRAST TECHNIQUE: Contiguous axial images were obtained from the base of the skull through the vertex without intravenous contrast. COMPARISON:  None. FINDINGS: Brain: Remote left superior cerebellar infarct is noted with volume loss. An acute/subacute right superior cerebellar infarct is present loss of gray-white differentiation and effacement of sulci. No acute hemorrhage or other mass lesion is present. No other acute or focal cortical abnormality is present. The ventricles are of normal size. No significant extraaxial fluid collection is present. Vascular: Atherosclerotic changes are present the cavernous internal carotid arteries. No hyperdense vessel is present. Skull: Calvarium is intact. No focal lytic or blastic lesions are present. No significant  extracranial soft tissue lesion is present. Sinuses/Orbits: The paranasal sinuses and mastoid air cells are clear. The globes and orbits are within normal limits. IMPRESSION: 1. Acute/subacute nonhemorrhagic right superior cerebellar infarct. 2. Remote left superior cerebellar infarct. 3. Atherosclerosis. These results were called by telephone at the time of interpretation on 09/11/2020 at 11:06 am to provider [facility] , who verbally acknowledged these results. Electronically Signed   By: San Morelle M.D.   On: 09/11/2020 11:06   MR BRAIN WO CONTRAST  Result Date: 09/13/2020 CLINICAL DATA:  Stroke suspected. EXAM: MRI HEAD WITHOUT CONTRAST TECHNIQUE: Multiplanar, multiecho pulse sequences of the brain and surrounding structures were obtained without intravenous contrast. COMPARISON:  Head CT September 11, 2020. FINDINGS: Brain: Area of restricted diffusion involving the superior right cerebellar hemisphere extending to the superior cerebellar peduncle, consistent with right superior cerebellar artery territory infarct. There is mild mass effect with effacement of the fourth ventricle which remains patent. No hydrocephalus. Remote infarct in the left cerebellar hemisphere. Scattered foci of T2 hyperintensity are seen within the white matter of the cerebral hemispheres, nonspecific. Vascular: Normal flow voids. Skull and upper cervical spine: Normal marrow signal. Sinuses/Orbits: Opacification of the right maxillary sinus. Mucosal thickening of the left maxillary sinus and bilateral ethmoid cells. The orbits are maintained. Other: None. IMPRESSION: 1. 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 Mild mass effect on the fourth ventricle  which remains patent. No hydrocephalus. No evidence of hemorrhagic transformation. 2. Remote left superior cerebellar artery territory infarct. 3. Mild-to-moderate chronic microvascular ischemic changes of the white matter. Electronically Signed    By: Pedro Earls M.D.   On: 09/13/2020 13:15   CT ANGIO CHEST AORTA W/CM & OR WO/CM  Result Date: 09/16/2020 CLINICAL DATA:  History of aortic thrombus, stroke EXAM: CT ANGIOGRAPHY CHEST WITH CONTRAST TECHNIQUE: Multidetector CT imaging of the chest was performed using the standard protocol during bolus administration of intravenous contrast. Multiplanar CT image reconstructions and MIPs were obtained to evaluate the vascular anatomy. CONTRAST:  165m OMNIPAQUE IOHEXOL 350 MG/ML SOLN COMPARISON:  September 11, 2020 FINDINGS: Cardiovascular: Preferential opacification of the thoracic aorta. No evidence of thoracic aortic aneurysm or dissection. Specifically, no evidence of intraluminal thrombus or other filling defect in the thoracic aorta. The previously identified wall adherent thrombus in the anterior wall of the ascending thoracic aorta is no longer visualized. Normal heart size. No pericardial effusion. No intracardiac thrombus identified. Mediastinum/Nodes: Prominent mediastinal lymph nodes are favored reactive. The thyroid gland appears normal. Lungs/Pleura: No pleural effusion. No pneumothorax. Nodular consolidation in the bilateral upper lobes. Musculoskeletal: No aggressive osseous lesions. Upper abdomen: There is an approximately 1 cm enhancing lesion in segment 2 of the liver, which is favored represent a small flash filling hemangioma. The visualized upper abdomen is otherwise unremarkable. Review of the MIP images confirms the above findings. IMPRESSION: 1. The previously identified wall adherent thrombus in the anterior wall of the ascending thoracic aorta is no longer visualized. No other filling defects identified in the thoracic aorta or within the heart. 2. Nodular consolidation cenetered in the bilateral upper lobes with reactive mediastinal lymphadenopathy is most likely infectious pneumonia. Consider follow-up CT chest in 2-3 months to ensure resolution. Electronically Signed    By: YAlbin FellingMD   On: 09/16/2020 14:49   ECHOCARDIOGRAM COMPLETE  Result Date: 09/12/2020    ECHOCARDIOGRAM REPORT   Patient Name:   LJRUE ORMESDate of Exam: 09/12/2020 Medical Rec #:  [id number]     Height:       75.0 in Accession #:    [id number]    Weight:       163.0 lb Date of Birth:  709-Jan-1962     BSA:          2.012 m Patient Age:    89years       BP:           188/106 mmHg Patient Gender: M              HR:           103 bpm. Exam Location:  [facility] Procedure: 2D Echo, Color Doppler and Cardiac Doppler Indications:     I63.9 Stroke  History:         Patient has no prior history of Echocardiogram examinations.  Sonographer:     JCharmayne SheerRDCS (AE) Referring Phys:  [id number] ARORA Diagnosing Phys: BSerafina RoyalsMD  Sonographer Comments: Technically difficult study due to poor echo windows. Image acquisition challenging due to uncooperative patient and Image acquisition challenging due to respiratory motion. IMPRESSIONS  1. Left ventricular ejection fraction, by estimation, is 70 to 75%. The left ventricle has hyperdynamic function. The left ventricle has no regional wall motion abnormalities. Left ventricular diastolic parameters were normal.  2. Right ventricular systolic function is normal. The right ventricular size is normal.  3.  The mitral valve is normal in structure. Trivial mitral valve regurgitation.  4. The aortic valve is normal in structure. Aortic valve regurgitation is not visualized. FINDINGS  Left Ventricle: Left ventricular ejection fraction, by estimation, is 70 to 75%. The left ventricle has hyperdynamic function. The left ventricle has no regional wall motion abnormalities. The left ventricular internal cavity size was small. There is no  left ventricular hypertrophy. Left ventricular diastolic parameters were normal. Right Ventricle: The right ventricular size is normal. No increase in right ventricular wall thickness. Right ventricular systolic function is normal. Left  Atrium: Left atrial size was normal in size. Right Atrium: Right atrial size was normal in size. Pericardium: There is no evidence of pericardial effusion. Mitral Valve: The mitral valve is normal in structure. Trivial mitral valve regurgitation. MV peak gradient, 2.9 mmHg. The mean mitral valve gradient is 1.0 mmHg. Tricuspid Valve: The tricuspid valve is normal in structure. Tricuspid valve regurgitation is trivial. Aortic Valve: The aortic valve is normal in structure. Aortic valve regurgitation is not visualized. Aortic valve mean gradient measures 5.0 mmHg. Aortic valve peak gradient measures 9.6 mmHg. Aortic valve area, by VTI measures 2.34 cm. Pulmonic Valve: The pulmonic valve was normal in structure. Pulmonic valve regurgitation is not visualized. Aorta: The aortic root and ascending aorta are structurally normal, with no evidence of dilitation. IAS/Shunts: No atrial level shunt detected by color flow Doppler.  LEFT VENTRICLE PLAX 2D LVIDd:         3.70 cm  Diastology LVIDs:         2.30 cm  LV e' medial:    5.44 cm/s LV PW:         0.90 cm  LV E/e' medial:  9.2 LV IVS:        0.80 cm  LV e' lateral:   7.29 cm/s LVOT diam:     2.10 cm  LV E/e' lateral: 6.9 LV SV:         46 LV SV Index:   23 LVOT Area:     3.46 cm  LEFT ATRIUM             Index LA diam:        2.30 cm 1.14 cm/m LA Vol (A2C):   26.2 ml 13.02 ml/m LA Vol (A4C):   22.0 ml 10.94 ml/m LA Biplane Vol: 24.5 ml 12.18 ml/m  AORTIC VALVE                    PULMONIC VALVE AV Area (Vmax):    2.37 cm     PV Vmax:       1.15 m/s AV Area (Vmean):   2.23 cm     PV Vmean:      80.800 cm/s AV Area (VTI):     2.34 cm     PV VTI:        0.150 m AV Vmax:           155.00 cm/s  PV Peak grad:  5.3 mmHg AV Vmean:          105.000 cm/s PV Mean grad:  3.0 mmHg AV VTI:            0.197 m AV Peak Grad:      9.6 mmHg AV Mean Grad:      5.0 mmHg LVOT Vmax:         106.00 cm/s LVOT Vmean:        67.600 cm/s LVOT VTI:  0.133 m LVOT/AV VTI ratio: 0.68   AORTA Ao Root diam: 3.50 cm MITRAL VALVE MV Area (PHT): 7.99 cm    SHUNTS MV Area VTI:   3.71 cm    Systemic VTI:  0.13 m MV Peak grad:  2.9 mmHg    Systemic Diam: 2.10 cm MV Mean grad:  1.0 mmHg MV Vmax:       0.85 m/s MV Vmean:      57.9 cm/s MV Decel Time: 95 msec MV E velocity: 50.10 cm/s MV A velocity: 78.00 cm/s MV E/A ratio:  0.64 Serafina Royals MD Electronically signed by Serafina Royals MD Signature Date/Time: 09/12/2020/2:42:46 PM    Final    CT ANGIO HEAD NECK W WO CM W PERF  Result Date: 09/11/2020 CLINICAL DATA:  Stroke, follow-up EXAM: CT ANGIOGRAPHY HEAD AND NECK CT PERFUSION BRAIN TECHNIQUE: Multidetector CT imaging of the head and neck was performed using the standard protocol during bolus administration of intravenous contrast. Multiplanar CT image reconstructions and MIPs were obtained to evaluate the vascular anatomy. Carotid stenosis measurements (when applicable) are obtained utilizing NASCET criteria, using the distal internal carotid diameter as the denominator. Multiphase CT imaging of the brain was performed following IV bolus contrast injection. Subsequent parametric perfusion maps were calculated using RAPID software. CONTRAST:  158m OMNIPAQUE IOHEXOL 350 MG/ML SOLN COMPARISON:  None. FINDINGS: CTA NECK Aortic arch: There is focal free-floating or adherent thrombus along the ascending thoracic aorta. Minor calcified plaque along the arch. Great vessel origins are patent with poor visualization of the left common carotid origin due to streak artifact from adjacent venous contrast. Right carotid system: Post patent.  No stenosis. Left carotid system: Patent.  No stenosis. Vertebral arteries: Patent. Suboptimal evaluation due to adjacent venous contrast. Left vertebral is dominant. No stenosis identified. Skeleton: No significant abnormality. Other neck: Unremarkable. Upper chest: Enlarged 1.2 cm right paratracheal node. Nonenlarged 9 mm prevascular node. Foci of nodular consolidation in  the visualized lungs. Review of the MIP images confirms the above findings CTA HEAD Suboptimal contrast bolus. Anterior circulation: Intracranial internal carotid arteries are patent with trace calcified plaque. Anterior cerebral arteries are patent. Right A1 ACA is dominant. Middle cerebral arteries are patent. Posterior circulation: Intracranial vertebral arteries are patent. Basilar artery is patent. Major cerebellar artery origins are not well evaluated due to contrast timing. Right superior cerebellar artery origin is patent with suspected subsequent occlusion. Posterior cerebral arteries are patent. Venous sinuses: Patent as allowed by contrast bolus timing. Review of the MIP images confirms the above findings CT Brain Perfusion Findings: CBF (<30%) Volume: 070mPerfusion (Tmax>6.0s) volume: 15m32mismatch Volume: 15mL72mfarction Location:Area of territory at risk partially corresponds to right superior cerebellar infarction on noncontrast head CT. IMPRESSION: Suboptimal contrast bolus timing. Focal free floating or adherent thrombus along the ascending thoracic aorta. Major cerebellar artery origins are not well evaluated due to contrast timing. Probable patent right superior cerebellar artery origin with suspected subsequent occlusion. Perfusion imaging is likely providing incomplete information in the posterior fossa. There is 3 mL of penumbra identified partially corresponding to the area of right superior cerebellar infarction on the noncontrast head CT. Foci of nodular consolidation in the visualized lungs suspicious for pneumonia. Reactive mediastinal adenopathy. Initial results were discussed by telephone at the time of interpretation on 09/11/2020 at 1:10 pm to provider [facility] verb[facility] acknowledged these results. Electronically Signed   By: PranMacy Mis.   On: 09/11/2020 13:23      Subjective: Patient  seen and examined at the bedside this morning.  Hemodynamically stable for  discharge.  Discharge Exam: Vitals:   09/18/20 0356 09/18/20 0828  BP: (!) 132/99 (!) 144/105  Pulse: 72 85  Resp: 15 14  Temp: 98.5 F (36.9 C) 98.2 F (36.8 C)  SpO2: 98% 97%   Vitals:   09/17/20 1956 09/18/20 0002 09/18/20 0356 09/18/20 0828  BP: (!) 150/92 131/86 (!) 132/99 (!) 144/105  Pulse:  76 72 85  Resp:  '14 15 14  '$ Temp: 98 F (36.7 C) 98.2 F (36.8 C) 98.5 F (36.9 C) 98.2 F (36.8 C)  TempSrc: Oral Oral Oral Oral  SpO2:  97% 98% 97%    General: Pt is alert, awake, not in acute distress Cardiovascular: RRR, S1/S2 +, no rubs, no gallops Respiratory: CTA bilaterally, no wheezing, no rhonchi Abdominal: Soft, NT, ND, bowel sounds + Extremities: no edema, no cyanosis    The results of significant diagnostics from this hospitalization (including imaging, microbiology, ancillary and laboratory) are listed below for reference.     Microbiology: Recent Results (from the past 240 hour(s))  Resp Panel by RT-PCR (Flu A&B, Covid) Nasopharyngeal Swab     Status: None   Collection Time: 09/11/20 12:08 PM   Specimen: Nasopharyngeal Swab; Nasopharyngeal(NP) swabs in vial transport medium  Result Value Ref Range Status   SARS Coronavirus 2 by RT PCR NEGATIVE NEGATIVE Final    Comment: (NOTE) SARS-CoV-2 target nucleic acids are NOT DETECTED.  The SARS-CoV-2 RNA is generally detectable in upper respiratory specimens during the acute phase of infection. The lowest concentration of SARS-CoV-2 viral copies this assay can detect is 138 copies/mL. A negative result does not preclude SARS-Cov-2 infection and should not be used as the sole basis for treatment or other patient management decisions. A negative result may occur with  improper specimen collection/handling, submission of specimen other than nasopharyngeal swab, presence of viral mutation(s) within the areas targeted by this assay, and inadequate number of viral copies(<138 copies/mL). A negative result must be  combined with clinical observations, patient history, and epidemiological information. The expected result is Negative.  Fact Sheet for Patients:  EntrepreneurPulse.com.au  Fact Sheet for Healthcare Providers:  IncredibleEmployment.be  This test is no t yet approved or cleared by the [place] FDA and  has been authorized for detection and/or diagnosis of SARS-CoV-2 by FDA under an Emergency Use Authorization (EUA). This EUA will remain  in effect (meaning this test can be used) for the duration of the COVID-19 declaration under Section 564(b)(1) of the Act, 21 U.S.C.section 360bbb-3(b)(1), unless the authorization is terminated  or revoked sooner.       Influenza A by PCR NEGATIVE NEGATIVE Final   Influenza B by PCR NEGATIVE NEGATIVE Final    Comment: (NOTE) The Xpert Xpress SARS-CoV-2/FLU/RSV plus assay is intended as an aid in the diagnosis of influenza from Nasopharyngeal swab specimens and should not be used as a sole basis for treatment. Nasal washings and aspirates are unacceptable for Xpert Xpress SARS-CoV-2/FLU/RSV testing.  Fact Sheet for Patients: EntrepreneurPulse.com.au  Fact Sheet for Healthcare Providers: IncredibleEmployment.be  This test is not yet approved or cleared by the [place] FDA and has been authorized for detection and/or diagnosis of SARS-CoV-2 by FDA under an Emergency Use Authorization (EUA). This EUA will remain in effect (meaning this test can be used) for the duration of the COVID-19 declaration under Section 564(b)(1) of the Act, 21 U.S.C. section 360bbb-3(b)(1), unless the authorization is terminated or revoked.  Performed at [facility], [address] [place], [place] [address]      Labs: BNP (last 3 results) No results for input(s): BNP in the last 8760 hours. Basic Metabolic Panel: Recent Labs  Lab 09/12/20 0930 09/13/20 0146  09/14/20 0247  NA 136 134* 136  K 3.8 3.6 4.1  CL 100 101 106  CO2 '24 23 22  '$ GLUCOSE 139* 118* 109*  BUN '14 14 13  '$ CREATININE 0.86 0.99 0.93  CALCIUM 10.0 9.5 9.3  MG  --  2.4 2.1  PHOS  --   --  3.7   Liver Function Tests: No results for input(s): AST, ALT, ALKPHOS, BILITOT, PROT, ALBUMIN in the last 168 hours. No results for input(s): LIPASE, AMYLASE in the last 168 hours. No results for input(s): AMMONIA in the last 168 hours. CBC: Recent Labs  Lab 09/12/20 0930 09/13/20 0146 09/14/20 0247 09/15/20 0509 09/16/20 0401 09/17/20 0411 09/18/20 0330  WBC 8.4   < > 5.7 4.5 4.2 4.4 4.5  NEUTROABS 6.5  --   --   --   --   --   --   HGB 16.3   < > 14.9 13.5 13.4 14.1 15.0  HCT 48.2   < > 46.4 40.5 41.1 42.4 44.7  MCV 93.6   < > 96.1 95.3 95.4 94.4 93.3  PLT 373   < > 327 281 324 318 355   < > = values in this interval not displayed.   Cardiac Enzymes: No results for input(s): CKTOTAL, CKMB, CKMBINDEX, TROPONINI in the last 168 hours. BNP: Invalid input(s): POCBNP CBG: Recent Labs  Lab 09/12/20 0732  GLUCAP 115*   D-Dimer No results for input(s): DDIMER in the last 72 hours. Hgb A1c No results for input(s): HGBA1C in the last 72 hours. Lipid Profile No results for input(s): CHOL, HDL, LDLCALC, TRIG, CHOLHDL, LDLDIRECT in the last 72 hours. Thyroid function studies No results for input(s): TSH, T4TOTAL, T3FREE, THYROIDAB in the last 72 hours.  Invalid input(s): FREET3 Anemia work up No results for input(s): VITAMINB12, FOLATE, FERRITIN, TIBC, IRON, RETICCTPCT in the last 72 hours. Urinalysis    Component Value Date/Time   COLORURINE STRAW (A) 09/11/2020 1521   APPEARANCEUR CLEAR (A) 09/11/2020 1521   LABSPEC 1.036 (H) 09/11/2020 1521   PHURINE 6.0 09/11/2020 1521   GLUCOSEU NEGATIVE 09/11/2020 1521   HGBUR SMALL (A) 09/11/2020 1521   BILIRUBINUR NEGATIVE 09/11/2020 1521   KETONESUR 5 (A) 09/11/2020 1521   PROTEINUR NEGATIVE 09/11/2020 1521   NITRITE  NEGATIVE 09/11/2020 1521   LEUKOCYTESUR NEGATIVE 09/11/2020 1521   Sepsis Labs Invalid input(s): PROCALCITONIN,  WBC,  LACTICIDVEN Microbiology Recent Results (from the past 240 hour(s))  Resp Panel by RT-PCR (Flu A&B, Covid) Nasopharyngeal Swab     Status: None   Collection Time: 09/11/20 12:08 PM   Specimen: Nasopharyngeal Swab; Nasopharyngeal(NP) swabs in vial transport medium  Result Value Ref Range Status   SARS Coronavirus 2 by RT PCR NEGATIVE NEGATIVE Final    Comment: (NOTE) SARS-CoV-2 target nucleic acids are NOT DETECTED.  The SARS-CoV-2 RNA is generally detectable in upper respiratory specimens during the acute phase of infection. The lowest concentration of SARS-CoV-2 viral copies this assay can detect is 138 copies/mL. A negative result does not preclude SARS-Cov-2 infection and should not be used as the sole basis for treatment or other patient management decisions. A negative result may occur with  improper specimen collection/handling, submission of specimen other than nasopharyngeal swab, presence of  viral mutation(s) within the areas targeted by this assay, and inadequate number of viral copies(<138 copies/mL). A negative result must be combined with clinical observations, patient history, and epidemiological information. The expected result is Negative.  Fact Sheet for Patients:  EntrepreneurPulse.com.au  Fact Sheet for Healthcare Providers:  IncredibleEmployment.be  This test is no t yet approved or cleared by the [place] FDA and  has been authorized for detection and/or diagnosis of SARS-CoV-2 by FDA under an Emergency Use Authorization (EUA). This EUA will remain  in effect (meaning this test can be used) for the duration of the COVID-19 declaration under Section 564(b)(1) of the Act, 21 U.S.C.section 360bbb-3(b)(1), unless the authorization is terminated  or revoked sooner.       Influenza A by PCR NEGATIVE  NEGATIVE Final   Influenza B by PCR NEGATIVE NEGATIVE Final    Comment: (NOTE) The Xpert Xpress SARS-CoV-2/FLU/RSV plus assay is intended as an aid in the diagnosis of influenza from Nasopharyngeal swab specimens and should not be used as a sole basis for treatment. Nasal washings and aspirates are unacceptable for Xpert Xpress SARS-CoV-2/FLU/RSV testing.  Fact Sheet for Patients: EntrepreneurPulse.com.au  Fact Sheet for Healthcare Providers: IncredibleEmployment.be  This test is not yet approved or cleared by the [place] FDA and has been authorized for detection and/or diagnosis of SARS-CoV-2 by FDA under an Emergency Use Authorization (EUA). This EUA will remain in effect (meaning this test can be used) for the duration of the COVID-19 declaration under Section 564(b)(1) of the Act, 21 U.S.C. section 360bbb-3(b)(1), unless the authorization is terminated or revoked.  Performed at [facility], [address], [place], [place] [address]     Please note: You were cared for by a hospitalist during your hospital stay. Once you are discharged, your primary care physician will handle any further medical issues. Please note that NO REFILLS for any discharge medications will be authorized once you are discharged, as it is imperative that you return to your primary care physician (or establish a relationship with a primary care physician if you do not have one) for your post hospital discharge needs so that they can reassess your need for medications and monitor your lab values.    Time coordinating discharge: 40 minutes  SIGNED:   Shelly Coss, MD  [facility] 09/18/2020, 10:28 AM Pager [phone]  If 7PM-7AM, please contact night-coverage [email or web address] Password [email or web address]

## 2020-09-18 NOTE — Progress Notes (Signed)
Occupational Therapy Treatment Patient Details Name: Malik Jones MRN: [id number] DOB: 12-28-1960 Today's Date: 09/18/2020    History of present illness 60 yo male admitted to [facility] on 09/11/20 for evaluation of unsteadiness, R sided weakness, and L sided numbness. Speech is mildly dysarthric. 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 Pt without significant PMH.   OT comments  Patient with continued progress toward goals, and good participation noted.  Session focused on R upper extremity coordination in stand.  Patient able to stand at elevated tray table for stacking exercises with R arm and using L arm as stabilize assist.  Completed stacking of 12 cups with one hand on RW and OT holding cups, and patient standing with no upper extremity support on the RW.  OT also incorporating patient reaching outside of his base of support and having to step with R leg to reach for cups.  Lastly, had patient standing and using both hands to fold towels and wash cloths.  Patient lives alone and does all IADL at home.  Patient with occasional Min A for dynamic stand balance, but generally min guard.  Coordination remains ataxic.  OT to continues efforts in the acute setting, but he is hoping to transition to CR today.    Follow Up Recommendations  CIR    Equipment Recommendations  3 in 1 bedside commode;Tub/shower seat    Recommendations for Other Services      Precautions / Restrictions Precautions Precautions: Fall Precaution Comments: ataxia Restrictions Weight Bearing Restrictions: No       Mobility Bed Mobility Overal bed mobility: Needs Assistance Bed Mobility: Supine to Sit     Supine to sit: Supervision     General bed mobility comments: for safety, increased time and use of bedrails Patient Response: Flat affect  Transfers Overall transfer level: Needs assistance Equipment used: Rolling  walker (2 wheeled) Transfers: Sit to/from Stand Sit to Stand: Min guard;Min assist         General transfer comment: min guard from elevated surface, light min assist for initial rise from lower surface. STS x1 from EOB, x6 from wheelchair in rehab gym as intervention.    Balance Overall balance assessment: Needs assistance Sitting-balance support: No upper extremity supported;Feet supported Sitting balance-Leahy Scale: Good     Standing balance support: Bilateral upper extremity supported Standing balance-Leahy Scale: Poor Standing balance comment: Reliant on UE support during dynamic standing activity/transitional movements.                             Cognition Arousal/Alertness: Awake/alert Behavior During Therapy: WFL for tasks assessed/performed;Flat affect Overall Cognitive Status: No family/caregiver present to determine baseline cognitive functioning Area of Impairment: Safety/judgement;Problem solving                         Safety/Judgement: Decreased awareness of safety;Decreased awareness of deficits   Problem Solving: Requires verbal cues          Exercises Other Exercises Other Exercises: box taps R foot x10, lateral step RLE onto step x10, diagonal step L and R with RLE x10 each direction Other Exercises: STS with RLE in rear of LLE, x5   Shoulder Instructions       General Comments HRmax 137 bpm, during abdominal cramp    Pertinent Vitals/ Pain       Pain Assessment: No/denies pain Faces Pain  Scale: Hurts little more Pain Location: L abdomen Pain Descriptors / Indicators: Spasm;Cramping Pain Intervention(s): Monitored during session                                                          Frequency  Min 2X/week        Progress Toward Goals  OT Goals(current goals can now be found in the care plan section)  Progress towards OT goals: Progressing toward goals  Acute Rehab OT Goals Patient  Stated Goal: get better OT Goal Formulation: With patient Time For Goal Achievement: 09/27/20 Potential to Achieve Goals: Good  Plan Discharge plan remains appropriate    Co-evaluation                 AM-PAC OT "6 Clicks" Daily Activity     Outcome Measure   Help from another person eating meals?: A Little Help from another person taking care of personal grooming?: A Little Help from another person toileting, which includes using toliet, bedpan, or urinal?: A Little Help from another person bathing (including washing, rinsing, drying)?: A Little Help from another person to put on and taking off regular upper body clothing?: A Little Help from another person to put on and taking off regular lower body clothing?: A Little 6 Click Score: 18    End of Session Equipment Utilized During Treatment: Gait belt;Rolling walker  OT Visit Diagnosis: Unsteadiness on feet (R26.81);Other abnormalities of gait and mobility (R26.89);Ataxia, unspecified (R27.0)   Activity Tolerance Patient tolerated treatment well   Patient Left in bed;with call bell/phone within reach   Nurse Communication          Time: [id number] OT Time Calculation (min): 17 min  Charges: OT General Charges $OT Visit: 1 Visit OT Treatments $Neuromuscular Re-education: 8-22 mins  09/18/2020  Rich, OTR/L  Acute Rehabilitation Services  Office:  [place] 09/18/2020, 2:47 PM

## 2020-09-18 NOTE — TOC CAGE-AID Note (Signed)
Transition of Care [facility]) - CAGE-AID Screening   Patient Details  Name: Malik Jones MRN: [id number] Date of Birth: Jun 26, 1960  Transition [facility] Care [facility]) CM/SW Contact:    Abdulkadir Emmanuel C Tarpley-Carter, LCSWA Phone Number: 09/18/2020, 3:05 PM   Clinical Narrative: Pt participated in [place].  Pt stated he does not use substance, but uses ETOH.  Pt was offered resources.  Pt stated they did not feel that they were in need of resources at this time.   CSW will provide pt with resources on dc summary for future use.  Cleo Santucci Tarpley-Carter, MSW, LCSW-A Pronouns:  She/Her/Hers [facility]itions of Care Clinical Social Worker Direct Number:  [phone] [email or web address]    CAGE-AID Screening:    Have You Ever Felt You Ought to Cut Down on Your Drinking or Drug Use?: Yes Have People Annoyed You By [employer] Your Drinking Or Drug Use?: No   Have You Ever Had a Drink or Used Drugs First Thing In The Morning to Steady Your Nerves or to Get Rid of a Hangover?: No    Substance Abuse Education Offered: Yes

## 2020-09-18 NOTE — H&P (Signed)
Physical Medicine and Rehabilitation Admission H&P    CC: Stroke with functional deficits   HPI: Malik Jones is a 60 year old male who was admitted on 09/12/20 to [facility] with dizziness, problems with coordination on the right and numbness LUE/LLE and falls.  UDS positive for cannabinoids. CT head showed acute/subacute nonhemorrhagic right superior cerebellar infarct and remote. 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  2D echo showed EF 70-75%.  Dr. 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  Patient is without any clinical signs of pneumonia and being monitored.   Dr. Stanford Breed was consulted for input continued anticoagulation and as patient is showing neurological recovery, he recommended nonurgent cardiac surgery evaluation.  Dr. Roxan Hockey was consulted for input and recommended repeat CTA chest which was done on  09/16/2020 showing previously identified adherent thrombus, no longer visualized with no other filling defects and thoracic aorta or within heart (and nodular consolidation centered in bilateral upper lobes with reactive mediastinal lymphadenopathy likely due to infectious pneumonia with recommendations for follow-up CT chest in 2 to 3 months to monitor for resolution).  He did not feel surgical intervention needed as thrombus had resolved, anticoagulation with repeat CT in 3 months to monitor adenopathy as well as hematology consult to rule out hypercoagulable state.  Therapy ongoing and patient limited by right sided hemiparesis with ataxia, left sensory deficits as well as higher level  cognitive deficits. CIR recommended due to functional decline. Please see preadmission assessment from earlier today as well.   Review of Systems  Constitutional:  Negative for chills and fever.  HENT:  Negative for hearing loss and tinnitus.   Respiratory:  Negative for cough and shortness of breath.   Cardiovascular:  Positive for chest pain (left lower rib pain with movement/reach).  Gastrointestinal:  Negative for abdominal pain, heartburn and nausea.  Genitourinary:  Negative for dysuria, frequency and urgency.  Musculoskeletal:  Positive for myalgias. Negative for back pain, joint pain and neck pain.  Skin:  Negative for rash.  Neurological:  Positive for sensory change (numbness on right) and weakness (right side). Negative for dizziness and headaches.  Psychiatric/Behavioral:  The patient does not have insomnia.   All other systems reviewed and are negative.   Past Medical History:  Diagnosis Date   Medical history non-contributory     Past Surgical History:  Procedure Laterality Date   MANDIBLE FRACTURE SURGERY     NO PAST SURGERIES      Family History  Problem Relation Age of Onset   Kidney disease Mother    Hip fracture Father     Social History: Single and live alone. Works in Barrister's clerk at [employer].  He reports that he has never smoked. He has never used smokeless tobacco. He reports current alcohol use--occasionally He reports current drug use. Drug: Marijuana--weekly   Allergies: No Known Allergies   Medications Prior to Admission  Medication Sig Dispense Refill   ibuprofen (ADVIL) 200 MG tablet Take 400 mg by mouth every 6 (six) hours as needed for mild pain.      Drug Regimen Review  Drug regimen was reviewed and remains  appropriate with no significant issues identified  Home: Home Living Family/patient expects to be discharged to:: Private residence Living Arrangements: Alone Available Help at Discharge: Family, Available 24 hours/day Type  of Home: House Home Access: Stairs to enter [employer] of Steps: 4-5 Entrance Stairs-Rails: None Home Layout: One level Bathroom Shower/Tub: Multimedia programmer: Standard Bathroom Accessibility: Yes Home Equipment: None   Functional History: Prior Function Level of Independence: Independent Comments: Drives and works in Scientist, research (medical) at [employer].  Functional Status:  Mobility: Bed Mobility Overal bed mobility: Needs Assistance Bed Mobility: Supine to Sit Supine to sit: Supervision Sit to supine: Supervision, HOB elevated General bed mobility comments: for safety, increased time and use of bedrails Transfers Overall transfer level: Needs assistance Equipment used: Rolling walker (2 wheeled) Transfers: Sit to/from Stand Sit to Stand: Min guard, Min assist General transfer comment: min guard from elevated surface, light min assist for initial rise from lower surface. STS x1 from EOB, x6 from wheelchair in rehab gym as intervention. Ambulation/Gait Ambulation/Gait assistance: Min assist Gait Distance (Feet): 80 Feet (x2 - to and from rehab gym) Assistive device: Rolling walker (2 wheeled) Gait Pattern/deviations: Step-through pattern, Decreased stride length, Ataxic, Trunk flexed General Gait Details: min assist to steady, cues for widening BOS x2 and hallway navigation especially on R. Gait velocity: decr Gait velocity interpretation: <1.31 ft/sec, indicative of household ambulator Stairs: Yes Stairs assistance: Min assist Stair Management: Two rails, Step to pattern, Forwards Number of Stairs: 1 (x20, as intervention)    ADL: ADL Overall ADL's : Needs assistance/impaired Eating/Feeding: Set up, Sitting (feeding self with LUE as unable to feed self with R dominant hand) Grooming: Oral care, Minimal assistance, Standing, Cueing for sequencing, Cueing for safety, Wash/dry face Grooming Details (indicate cue type and reason): Min A for standing balance with  slight posteruior lean. Difficulty with targeted reach and grasp during oral care and washing his face. Upper Body Bathing: Min guard, Sitting Lower Body Bathing: Minimal assistance, Sit to/from stand Lower Body Bathing Details (indicate cue type and reason): Min A for posterior lean Upper Body Dressing : Moderate assistance, Sitting Lower Body Dressing: Minimal assistance, Sit to/from stand Lower Body Dressing Details (indicate cue type and reason): donning underwear and shoes. Min A for posterior lean during donning underwear Toilet Transfer: Min guard, Ambulation, RW (simulated to chair) Toileting- Clothing Manipulation and Hygiene: Moderate assistance, Sit to/from stand Functional mobility during ADLs: Minimal assistance, Rolling walker General ADL Comments: Pt very motivated. Continues to present wityh decreased coroindation, balance, strength, and activity tolerance. Pt performing grooming and simple batyhing task at sink with seated rest breaks  Cognition: Cognition Overall Cognitive Status: No family/caregiver present to determine baseline cognitive functioning Arousal/Alertness: Awake/alert Orientation Level: Oriented X4 Attention: Sustained Sustained Attention: Impaired Sustained Attention Impairment: Verbal complex Memory: Impaired Memory Impairment: Retrieval deficit Comments: pt with recall difficulties - recalling 2/5 words independently, 1 of 5 with category cue, did not recall two others despite multiple choice Cognition Arousal/Alertness: Awake/alert Behavior During Therapy: WFL for tasks assessed/performed, Flat affect Overall Cognitive Status: No family/caregiver present to determine baseline cognitive functioning Area of Impairment: Safety/judgement, Problem solving Safety/Judgement: Decreased awareness of safety, Decreased awareness of deficits Problem Solving: Requires verbal cues General Comments: Fairly flat affect, follow multi step commands, Ox4,   Blood  pressure (!) (P) 137/91, pulse (P) 94, temperature (P) 98.2 F (36.8 C), temperature source (P) Oral, resp. rate (P) 11, SpO2 (P) 99 %. Physical Exam Vitals and nursing note reviewed.  Constitutional:      General: He is not in acute distress.    Appearance: Normal appearance. He is not ill-appearing.  HENT:     Head: Normocephalic and atraumatic.     Right Ear: External ear normal.     Left Ear: External ear normal.     Nose: Congestion present.  Eyes:     General: No scleral icterus.       Right eye: No discharge.        Left eye: No discharge.  Cardiovascular:     Rate and Rhythm: Normal rate and regular rhythm.     Pulses: Normal pulses.     Heart sounds: Normal heart sounds.  Pulmonary:     Effort: Pulmonary effort is normal.     Breath sounds: Normal breath sounds. No stridor.  Chest:     Chest wall: No tenderness.  Abdominal:     General: Abdomen is flat. There is no distension.     Palpations: Abdomen is soft.     Tenderness: There is no abdominal tenderness.  Musculoskeletal:     Cervical back: Normal range of motion and neck supple.  Skin:    General: Skin is warm and dry.  Neurological:     General: No focal deficit present.     Mental Status: He is alert and oriented to person, place, and time.     Comments: Mild dysarthira Alert Horizontal nystagmus without dizziness or diplopia. Ataxia with decreased coordination RUE>RLE. Sensory deficits LUE/LLE --improving.  RUE: 4+/5 proximal to distal with atxtia LUE/LLE: 5/5 proximal to distal  Psychiatric:        Mood and Affect: Mood normal.        Behavior: Behavior normal.    Results for orders placed or performed during the hospital encounter of 09/12/20 (from the past 48 hour(s))  Heparin level (unfractionated)     Status: None   Collection Time: 09/17/20  4:11 AM  Result Value Ref Range   Heparin Unfractionated 0.34 0.30 - 0.70 IU/mL    Comment: (NOTE) The clinical reportable range upper limit is being  lowered to >1.10 to align with the FDA approved guidance for the current laboratory assay.  If heparin results are below expected values, and patient dosage has  been confirmed, suggest follow up testing of antithrombin III levels. Performed at [facility], [place] [address] [place], [place] [address]   CBC     Status: None   Collection Time: 09/17/20  4:11 AM  Result Value Ref Range   WBC 4.4 4.0 - 10.5 K/uL   RBC 4.49 4.22 - 5.81 MIL/uL   Hemoglobin 14.1 13.0 - 17.0 g/dL   HCT 42.4 39.0 - 52.0 %   MCV 94.4 80.0 - 100.0 fL   MCH 31.4 26.0 - 34.0 pg   MCHC 33.3 30.0 - 36.0 g/dL   RDW 12.9 11.5 - 15.5 %   Platelets 318 150 - 400 K/uL   nRBC 0.0 0.0 - 0.2 %    Comment: Performed at [facility], [place] [address], [place] [address]  CBC     Status: None   Collection Time: 09/18/20  3:30 AM  Result Value Ref Range   WBC 4.5 4.0 - 10.5 K/uL   RBC 4.79 4.22 - 5.81 MIL/uL   Hemoglobin 15.0 13.0 - 17.0 g/dL   HCT 44.7 39.0 - 52.0 %   MCV 93.3 80.0 - 100.0 fL   MCH 31.3 26.0 - 34.0  pg   MCHC 33.6 30.0 - 36.0 g/dL   RDW 13.0 11.5 - 15.5 %   Platelets 355 150 - 400 K/uL   nRBC 0.0 0.0 - 0.2 %    Comment: Performed at [facility], [place] [address], [place], [place] [address]   No results found.     Medical Problem List and Plan: 1.  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  -patient may not shower  -ELOS/Goals: 7-010 daysn/Mod I  Admit to CIR 2.  Antithrombotics: -DVT/anticoagulation:  Pharmaceutical: Other (comment)--Eliquis  -antiplatelet therapy: N/A 3. Pain Management: Tylenol prn  Monitor with increased ecterntion. 4. Mood: LCSW to follow for evaluation and support.   -antipsychotic agents: N/a 5. Neuropsych: This patient is capable of making decisions on his own behalf. 6. Skin/Wound Care: Routine pressure relief measures.  7.  Fluids/Electrolytes/Nutrition: Monitor I/O.    CMP ordered for tomorrow AM.  8.  HTN: Monitor blood pressures 3 times daily.   --Continue amlodipine and HCTZ  --check potassium  level in AM    Monitor with increased mobility.  9.  New onset T2DM: Hemoglobin A1c 6.5.  RD consult to educate patient on CM diet.  --will start monitoring BS ac/hs and use SSI for elevated BS --change Glucerna to Ensure Max.    Monitor with increased activity   Bary Leriche, PA-C 09/18/2020 I have personally performed a face to face diagnostic evaluation, including, but not limited to relevant history and physical exam findings, of this patient and developed relevant assessment and plan.  Additionally, I have reviewed and concur with the physician assistant's documentation above.  Delice Lesch, MD, ABPMR  The patient's status has not changed. Any changes from the pre-admission screening or documentation from the acute chart are noted above.   Delice Lesch, MD, ABPMR

## 2020-09-18 NOTE — TOC Transition Note (Signed)
Transition of Care [facility]) - CM/SW Discharge Note   Patient Details  Name: Majestic Spessard MRN: [id number] Date of Birth: 12/11/1960  Transition of Care [facility]) CM/SW Contact:  Pollie Friar, RN Phone Number: 09/18/2020, 10:19 AM   Clinical Narrative:    Patient discharging to CIR today. CM signing off.    Final next level of care: IP Rehab Facility Barriers to Discharge: No Barriers Identified   Patient Goals and CMS Choice Patient states their goals for this hospitalization and ongoing recovery are:: To get stronger and go home.      Discharge Placement                       Discharge Plan and Services In-house Referral: NA                                   Social Determinants of Health (SDOH) Interventions     Readmission Risk Interventions No flowsheet data found.

## 2020-09-18 NOTE — Progress Notes (Signed)
  Speech Language Pathology Treatment:    Patient Details Name: Malik Jones MRN: [id number] DOB: 1960-09-24 Today's Date: 09/18/2020 Time: [id number] SLP Time Calculation (min) (ACUTE ONLY): 31 min  Assessment / Plan / Recommendation Clinical Impression  Pt seen today for skilled SLP for cognitive linguistic treatment to address dysarthria and memory compensation strategies.  Pt awake in bed and able to follow directions.  Rapid rate of imprecise articulation continues resulting in decreased intelligibility - approximately 60% at sentence level without background noise.  Consonant clusters more impaired than single consonants.  Pt responds well to verbal cues to overarticulate and slow rate of speech - however he speaks at rapid rate at baseline and needs max cues at this time.    Second goal addressed memory compensation - given pt right hand legibility is impaired, advised he use voice memos on his phone.  SLP demonstrated voice memo use to pt and had him record his own message - and open from home page with mod I.   Voice memo used for memory as well as speech intelligibility.  He is able to repeat up to 2 simple sentences re: communication due to decreased memory with inconsistent awareness.    SLP wrote signs for compensations and posted in room having pt use teach back for compensations.   Follow up with SLP on CIR advised but no acute follow up needed.  Pt agreeable to plan.   Marland Kitchen   HPI HPI: 60 yo male adm to [facility] with Pt admitted to [facility] on 09/11/20 for evaluation of unsteadiness, R sided weakness, and L sided numbness. Speech is mildly dysarthric. 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 Pt without significant PMH. Speech evaluation ordered, completed and SLP follow up for cognitive linguistic treatment.      SLP Plan  Continue with current plan of care       Recommendations                    Follow up Recommendations: Inpatient Rehab SLP Visit Diagnosis: Dysarthria and anarthria (R47.1);Cognitive communication deficit [id number]) Plan: Continue with current plan of care       GO                [name] 09/18/2020, 8:34 AM

## 2020-09-18 NOTE — H&P (Signed)
Physical Medicine and Rehabilitation Admission H&P    CC: Stroke with functional deficits   HPI: Malik Jones is a 60 year old male who was admitted on 09/12/20 to [facility] with dizziness, problems with coordination on the right and numbness LUE/LLE and falls.  UDS positive for cannabinoids. CT head showed acute/subacute nonhemorrhagic right superior cerebellar infarct and remote. 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  2D echo showed EF 70-75%.  Dr. 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  Patient is without any clinical signs of pneumonia and being monitored.   Dr. Stanford Breed was consulted for input continued anticoagulation and as patient is showing neurological recovery, he recommended nonurgent cardiac surgery evaluation.  Dr. Roxan Hockey was consulted for input and recommended repeat CTA chest which was done on  09/16/2020 showing previously identified adherent thrombus, no longer visualized with no other filling defects and thoracic aorta or within heart (and nodular consolidation centered in bilateral upper lobes with reactive mediastinal lymphadenopathy likely due to infectious pneumonia with recommendations for follow-up CT chest in 2 to 3 months to monitor for resolution).  He did not feel surgical intervention needed as thrombus had resolved, anticoagulation with repeat CT in 3 months to monitor adenopathy as well as hematology consult to rule out hypercoagulable state.  Therapy ongoing and patient limited by right sided hemiparesis with ataxia, left sensory deficits as well as higher level  cognitive deficits. CIR recommended due to functional decline. Please see preadmission assessment from earlier today as well.   Review of Systems  Constitutional:  Negative for chills and fever.  HENT:  Negative for hearing loss and tinnitus.   Respiratory:  Negative for cough and shortness of breath.   Cardiovascular:  Positive for chest pain (left lower rib pain with movement/reach).  Gastrointestinal:  Negative for abdominal pain, heartburn and nausea.  Genitourinary:  Negative for dysuria, frequency and urgency.  Musculoskeletal:  Positive for myalgias. Negative for back pain, joint pain and neck pain.  Skin:  Negative for rash.  Neurological:  Positive for sensory change (numbness on right) and weakness (right side). Negative for dizziness and headaches.  Psychiatric/Behavioral:  The patient does not have insomnia.   All other systems reviewed and are negative.   Past Medical History:  Diagnosis Date   Medical history non-contributory     Past Surgical History:  Procedure Laterality Date   MANDIBLE FRACTURE SURGERY     NO PAST SURGERIES      Family History  Problem Relation Age of Onset   Kidney disease Mother    Hip fracture Father     Social History: Single and live alone. Works in Barrister's clerk at [employer].  He reports that he has never smoked. He has never used smokeless tobacco. He reports current alcohol use--occasionally He reports current drug use. Drug: Marijuana--weekly   Allergies: No Known Allergies   Medications Prior to Admission  Medication Sig Dispense Refill   ibuprofen (ADVIL) 200 MG tablet Take 400 mg by mouth every 6 (six) hours as needed for mild pain.      Drug Regimen Review  Drug regimen was reviewed and remains  appropriate with no significant issues identified  Home: Home Living Family/patient expects to be discharged to:: Private residence Living Arrangements: Alone Available Help at Discharge: Family, Available 24 hours/day Type  of Home: House Home Access: Stairs to enter [employer] of Steps: 4-5 Entrance Stairs-Rails: None Home Layout: One level Bathroom Shower/Tub: Multimedia programmer: Standard Bathroom Accessibility: Yes Home Equipment: None   Functional History: Prior Function Level of Independence: Independent Comments: Drives and works in Scientist, research (medical) at [employer].  Functional Status:  Mobility: Bed Mobility Overal bed mobility: Needs Assistance Bed Mobility: Supine to Sit Supine to sit: Supervision Sit to supine: Supervision, HOB elevated General bed mobility comments: for safety, increased time and use of bedrails Transfers Overall transfer level: Needs assistance Equipment used: Rolling walker (2 wheeled) Transfers: Sit to/from Stand Sit to Stand: Min guard, Min assist General transfer comment: min guard from elevated surface, light min assist for initial rise from lower surface. STS x1 from EOB, x6 from wheelchair in rehab gym as intervention. Ambulation/Gait Ambulation/Gait assistance: Min assist Gait Distance (Feet): 80 Feet (x2 - to and from rehab gym) Assistive device: Rolling walker (2 wheeled) Gait Pattern/deviations: Step-through pattern, Decreased stride length, Ataxic, Trunk flexed General Gait Details: min assist to steady, cues for widening BOS x2 and hallway navigation especially on R. Gait velocity: decr Gait velocity interpretation: <1.31 ft/sec, indicative of household ambulator Stairs: Yes Stairs assistance: Min assist Stair Management: Two rails, Step to pattern, Forwards Number of Stairs: 1 (x20, as intervention)    ADL: ADL Overall ADL's : Needs assistance/impaired Eating/Feeding: Set up, Sitting (feeding self with LUE as unable to feed self with R dominant hand) Grooming: Oral care, Minimal assistance, Standing, Cueing for sequencing, Cueing for safety, Wash/dry face Grooming Details (indicate cue type and reason): Min A for standing balance with  slight posteruior lean. Difficulty with targeted reach and grasp during oral care and washing his face. Upper Body Bathing: Min guard, Sitting Lower Body Bathing: Minimal assistance, Sit to/from stand Lower Body Bathing Details (indicate cue type and reason): Min A for posterior lean Upper Body Dressing : Moderate assistance, Sitting Lower Body Dressing: Minimal assistance, Sit to/from stand Lower Body Dressing Details (indicate cue type and reason): donning underwear and shoes. Min A for posterior lean during donning underwear Toilet Transfer: Min guard, Ambulation, RW (simulated to chair) Toileting- Clothing Manipulation and Hygiene: Moderate assistance, Sit to/from stand Functional mobility during ADLs: Minimal assistance, Rolling walker General ADL Comments: Pt very motivated. Continues to present wityh decreased coroindation, balance, strength, and activity tolerance. Pt performing grooming and simple batyhing task at sink with seated rest breaks  Cognition: Cognition Overall Cognitive Status: No family/caregiver present to determine baseline cognitive functioning Arousal/Alertness: Awake/alert Orientation Level: Oriented X4 Attention: Sustained Sustained Attention: Impaired Sustained Attention Impairment: Verbal complex Memory: Impaired Memory Impairment: Retrieval deficit Comments: pt with recall difficulties - recalling 2/5 words independently, 1 of 5 with category cue, did not recall two others despite multiple choice Cognition Arousal/Alertness: Awake/alert Behavior During Therapy: WFL for tasks assessed/performed, Flat affect Overall Cognitive Status: No family/caregiver present to determine baseline cognitive functioning Area of Impairment: Safety/judgement, Problem solving Safety/Judgement: Decreased awareness of safety, Decreased awareness of deficits Problem Solving: Requires verbal cues General Comments: Fairly flat affect, follow multi step commands, Ox4,   Blood  pressure (!) (P) 137/91, pulse (P) 94, temperature (P) 98.2 F (36.8 C), temperature source (P) Oral, resp. rate (P) 11, SpO2 (P) 99 %. Physical Exam Vitals and nursing note reviewed.  Constitutional:      General: He is not in acute distress.    Appearance: Normal appearance. He is not ill-appearing.  HENT:     Head: Normocephalic and atraumatic.     Right Ear: External ear normal.     Left Ear: External ear normal.     Nose: Congestion present.  Eyes:     General: No scleral icterus.       Right eye: No discharge.        Left eye: No discharge.  Cardiovascular:     Rate and Rhythm: Normal rate and regular rhythm.     Pulses: Normal pulses.     Heart sounds: Normal heart sounds.  Pulmonary:     Effort: Pulmonary effort is normal.     Breath sounds: Normal breath sounds. No stridor.  Chest:     Chest wall: No tenderness.  Abdominal:     General: Abdomen is flat. There is no distension.     Palpations: Abdomen is soft.     Tenderness: There is no abdominal tenderness.  Musculoskeletal:     Cervical back: Normal range of motion and neck supple.  Skin:    General: Skin is warm and dry.  Neurological:     General: No focal deficit present.     Mental Status: He is alert and oriented to person, place, and time.     Comments: Mild dysarthira Alert Horizontal nystagmus without dizziness or diplopia. Ataxia with decreased coordination RUE>RLE. Sensory deficits LUE/LLE --improving.  RUE: 4+/5 proximal to distal with atxtia LUE/LLE: 5/5 proximal to distal  Psychiatric:        Mood and Affect: Mood normal.        Behavior: Behavior normal.    Results for orders placed or performed during the hospital encounter of 09/12/20 (from the past 48 hour(s))  Heparin level (unfractionated)     Status: None   Collection Time: 09/17/20  4:11 AM  Result Value Ref Range   Heparin Unfractionated 0.34 0.30 - 0.70 IU/mL    Comment: (NOTE) The clinical reportable range upper limit is being  lowered to >1.10 to align with the FDA approved guidance for the current laboratory assay.  If heparin results are below expected values, and patient dosage has  been confirmed, suggest follow up testing of antithrombin III levels. Performed at [facility], [place] [address], [place], [place] [address]   CBC     Status: None   Collection Time: 09/17/20  4:11 AM  Result Value Ref Range   WBC 4.4 4.0 - 10.5 K/uL   RBC 4.49 4.22 - 5.81 MIL/uL   Hemoglobin 14.1 13.0 - 17.0 g/dL   HCT 42.4 39.0 - 52.0 %   MCV 94.4 80.0 - 100.0 fL   MCH 31.4 26.0 - 34.0 pg   MCHC 33.3 30.0 - 36.0 g/dL   RDW 12.9 11.5 - 15.5 %   Platelets 318 150 - 400 K/uL   nRBC 0.0 0.0 - 0.2 %    Comment: Performed at [facility], [place] [address] [place] [address]  CBC     Status: None   Collection Time: 09/18/20  3:30 AM  Result Value Ref Range   WBC 4.5 4.0 - 10.5 K/uL   RBC 4.79 4.22 - 5.81 MIL/uL   Hemoglobin 15.0 13.0 - 17.0 g/dL   HCT 44.7 39.0 - 52.0 %   MCV 93.3 80.0 - 100.0 fL   MCH 31.3 26.0 - 34.0  pg   MCHC 33.6 30.0 - 36.0 g/dL   RDW 13.0 11.5 - 15.5 %   Platelets 355 150 - 400 K/uL   nRBC 0.0 0.0 - 0.2 %    Comment: Performed at [facility], [place] [address] [place], [place] [address]   No results found.     Medical Problem List and Plan: 1.  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  -patient may not shower  -ELOS/Goals: 7-010 daysn/Mod I  Admit to CIR 2.  Antithrombotics: -DVT/anticoagulation:  Pharmaceutical: Other (comment)--Eliquis  -antiplatelet therapy: N/A 3. Pain Management: Tylenol prn  Monitor with increased ecterntion. 4. Mood: LCSW to follow for evaluation and support.   -antipsychotic agents: N/a 5. Neuropsych: This patient is capable of making decisions on his own behalf. 6. Skin/Wound Care: Routine pressure relief measures.  7.  Fluids/Electrolytes/Nutrition: Monitor I/O.    CMP ordered for tomorrow AM.  8.  HTN: Monitor blood pressures 3 times daily.   --Continue amlodipine and HCTZ  --check potassium  level in AM    Monitor with increased mobility.  9.  New onset T2DM: Hemoglobin A1c 6.5.  RD consult to educate patient on CM diet.  --will start monitoring BS ac/hs and use SSI for elevated BS --change Glucerna to Ensure Max.    Monitor with increased activity   Bary Leriche, PA-C 09/18/2020 I have personally performed a face to face diagnostic evaluation, including, but not limited to relevant history and physical exam findings, of this patient and developed relevant assessment and plan.  Additionally, I have reviewed and concur with the physician assistant's documentation above.  Delice Lesch, MD, ABPMR

## 2020-09-18 NOTE — Progress Notes (Signed)
PMR Admission Coordinator Pre-Admission Assessment   Patient: Malik Jones is an 60 y.o., male MRN: [id number] DOB: 08-03-1960 Height:   Weight:     Insurance Information HMO:     PPO: yes     PCP:      IPA:      80/20:      OTHER: PRIMARY: BCBS      Policy#: [id number]    Subscriber: Pt  CM Name: Virgina Evener     Phone#: [phone] ext [phone]     Fax#: [phone]  Pre-Cert#: [id number]   I received a call from Collie Siad at [place] 09/18/20 giving authorization for admission 09/18/20-09/25/20 with updates due on 09/25/20   Employer: [employer]  Phone #:  [phone], ext [phone]     Name: Irene Shipper Date: 02/10/2020 - still active Deductible: $3,500 (0 met) OOP Max: $7,350 ($0 met) CIR: 70% coverage, 30% co-insurance SNF: 70% coverage, 30% co-insurance, limited to 100 days/cal yr; 100 remaining Outpatient:  70% coverage, 30% co-insurance, limited to 60 visits combined/cal yr(60 remaining) Home Health:  70% coverage, 30% co-insurance DME: 70% coverage, 30% co-insurance   SECONDARY: none       Policy#:      Phone#:   The "Data Collection Information Summary" for patients in Inpatient Rehabilitation Facilities with attached "Privacy Act [place] Records" was provided and verbally reviewed with: Patient   Emergency Contact Information Contact Information       Name Relation Home Work Mobile    [place] Sister     [phone]    Larwance Rote     [phone]    Jeric, Slagel     [phone]           Current Medical History  Patient Admitting Diagnosis: CVA History of Present Illness:  Malik Jones is a 60 y.o. male no significant past medical history other than marijuana abuse, presented to the [facility]  emergency room  09/11/20 for evaluation of unsteadiness and clumsiness on the right side. CT showed acute cerebellar infarct.  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  Vascular surgery was consulted who recommended transfer to higher center.  Neurology saw the patient in the hospital who ordered MRI of the brain and echocardiogram.  Patient was then started on heparin drip and transferred to [facility] for further evaluation and treatment. 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 CIR was consulted to assist in return to PLOF.   Patient's medical record from [facility] has been reviewed by the rehabilitation admission coordinator and physician.   Past Medical History      Past Medical History:  Diagnosis Date   Medical history non-contributory        Family History   family history includes Hip fracture in his father; Kidney disease in his mother.   Prior Rehab/Hospitalizations Has the patient had prior rehab or hospitalizations prior to admission? No   Has the patient had major surgery during 100 days prior to admission? No               Current Medications   Current Facility-Administered Medications:   0.9 %  sodium chloride infusion, , Intravenous, Continuous, Pokhrel, Laxman, MD, Last Rate: 100 mL/hr at 09/15/20 0828,  New Bag at 09/15/20 3094   acetaminophen (TYLENOL) tablet 650 mg, 650 mg, Oral, Q4H PRN **OR** acetaminophen (TYLENOL) 160 MG/5ML solution 650 mg, 650 mg, Per Tube, Q4H PRN **OR** acetaminophen (TYLENOL) suppository 650 mg, 650 mg, Rectal, Q4H PRN, Pokhrel, Laxman, MD   amLODipine (NORVASC) tablet 2.5 mg, 2.5 mg, Oral, Daily, Donnamae Jude, MD, 2.5 mg at 09/15/20 1031   atorvastatin (LIPITOR) tablet 40 mg, 40 mg, Oral, Daily, Pokhrel, Laxman, MD, 40 mg at 09/15/20 1031   feeding supplement (GLUCERNA SHAKE) (GLUCERNA SHAKE) liquid 237 mL, 237 mL, Oral,  TID BM, Pokhrel, Laxman, MD, 237 mL at 09/15/20 1031   heparin ADULT infusion 100 units/mL (25000 units/241m), 1,300 Units/hr, Intravenous, Continuous, BLaren Everts RPH, Last Rate: 13 mL/hr at 09/15/20 0022, 1,300 Units/hr at 09/15/20 0022   multivitamin with minerals tablet 1 tablet, 1 tablet, Oral, Daily, Pokhrel, Laxman, MD, 1 tablet at 09/15/20 1031   senna-docusate (Senokot-S) tablet 1 tablet, 1 tablet, Oral, QHS PRN, Pokhrel, Laxman, MD   Patients Current Diet:  Diet Order                  Diet regular Room service appropriate? Yes; Fluid consistency: Thin  Diet effective now                         Precautions / Restrictions Precautions Precautions: Fall Precaution Comments: ataxia Restrictions Weight Bearing Restrictions: No    Has the patient had 2 or more falls or a fall with injury in the past year? No   Prior Activity Level Community (5-7x/wk): Pt. was active in the community PTA   Prior Functional Level Self Care: Did the patient need help bathing, dressing, using the toilet or eating? Independent   Indoor Mobility: Did the patient need assistance with walking from room to room (with or without device)? Independent   Stairs: Did the patient need assistance with internal or external stairs (with or without device)? Independent   Functional Cognition: Did the patient need help planning regular tasks such as shopping or remembering to take medications? Independent   Home Assistive Devices / Equipment Home Equipment: None   Prior Device Use: Indicate devices/aids used by the patient prior to current illness, exacerbation or injury? None of the above   Current Functional Level Cognition   Arousal/Alertness: Awake/alert Overall Cognitive Status: No family/caregiver present to determine baseline cognitive functioning Orientation Level: Oriented X4 General Comments: Fairly flat affect, follow multi step commands, Ox4, Attention: Sustained Sustained  Attention: Impaired Sustained Attention Impairment: Verbal complex Memory: Impaired Memory Impairment: Retrieval deficit Comments: pt with recall difficulties - recalling 2/5 words independently, 1 of 5 with category cue, did not recall two others despite multiple choice    Extremity Assessment (includes Sensation/Coordination)   Upper Extremity Assessment: RUE deficits/detail, LUE deficits/detail RUE Deficits / Details: apparent motor sensory deficits resulting in decreased gross/fine motor coordination. poor control of speed and accuracy of movement, making funciotnal use of RUE difficult. strength WFL; dropping items LUE Deficits / Details: Strength WFL; decreased sensation throughout LUE  Lower Extremity Assessment: Defer to PT evaluation RLE Deficits / Details: dysmetria/dysdiadochokinesia/ataxia noted (R worse than L); gross strength of 5/5 MMT throughout; sensation to light touch intact RLE Coordination: decreased fine motor, decreased gross motor LLE Deficits / Details: dysmetria/dysdiadochokinesia/ataxia noted (R worse than L); gross strength of 5/5 MMT throughout; slight numbness to light touch noted throughout LLE Sensation: decreased light touch LLE Coordination: decreased fine motor, decreased  gross motor     ADLs   Overall ADL's : Needs assistance/impaired Eating/Feeding: Set up, Sitting (feeding self with LUE as unable to feed self with R dominant hand) Grooming: Minimal assistance, Sitting Upper Body Bathing: Minimal assistance, Sitting Lower Body Bathing: Moderate assistance, Sit to/from stand Upper Body Dressing : Moderate assistance, Sitting Lower Body Dressing: Maximal assistance, Sit to/from stand Toilet Transfer: Moderate assistance, RW Toileting- Clothing Manipulation and Hygiene: Moderate assistance, Sit to/from stand Functional mobility during ADLs: Moderate assistance General ADL Comments: improved performance with mod cues including sequencing and visual  reminders/sustained attention     Mobility   Overal bed mobility: Needs Assistance Bed Mobility: Supine to Sit Supine to sit: Supervision General bed mobility comments: supervision for safety     Transfers   Overall transfer level: Needs assistance Equipment used: Rolling walker (2 wheeled) Transfers: Sit to/from Stand Sit to Stand: Min guard General transfer comment: min guard consistently with cues for hand placement and posture with use of RW for support.     Ambulation / Gait / Stairs / Wheelchair Mobility   Ambulation/Gait Ambulation/Gait assistance: Min assist, Mod assist Gait Distance (Feet): 150 Feet Assistive device: Rolling walker (2 wheeled) Gait Pattern/deviations: Step-through pattern, Decreased stride length, Ataxic, Trunk flexed General Gait Details: LOB x 4-5 during ambulation (more with fatigue) with modA to recover. MinA for balance overall. Cues for stopping and restarting when patient becomes increasingly unsteady rather than pushing self which increases fall risk. Standing rest break x 5 throughout ambulation. HR max 110. Patient with forceful placement of R LE at times, cues for control and soft placement. Gait velocity: decreased Gait velocity interpretation: <1.31 ft/sec, indicative of household ambulator     Posture / Balance Dynamic Sitting Balance Sitting balance - Comments: Reaching min off BOS to donn socks with min guard assist, truncal instability noted. Balance Overall balance assessment: Needs assistance Sitting-balance support: No upper extremity supported, Feet supported Sitting balance-Leahy Scale: Fair Sitting balance - Comments: Reaching min off BOS to donn socks with min guard assist, truncal instability noted. Standing balance support: Bilateral upper extremity supported Standing balance-Leahy Scale: Poor Standing balance comment: reliant on UE support and external assist     Special needs/care consideration None     Previous Home  Environment (from acute therapy documentation) Living Arrangements: Alone Available Help at Discharge: Family, Available 24 hours/day Type of Home: House Home Layout: One level Home Access: Stairs to enter Entrance Stairs-Rails: None Entrance Stairs-Number of Steps: 4-5 Bathroom Shower/Tub: Multimedia programmer: Standard Bathroom Accessibility: Yes How Accessible: Accessible via walker   Discharge Living Setting Plans for Discharge Living Setting: Alone Type of Home at Discharge: House Discharge Home Layout: One level Discharge Home Access: Stairs to enter Entrance Stairs-Rails: None Entrance Stairs-Number of Steps: 4-5 Discharge Bathroom Shower/Tub: Walk-in shower Discharge Bathroom Toilet: Standard Discharge Bathroom Accessibility: Yes   Social/Family/Support Systems Patient Roles: Other (Comment) Contact Information: [phone] Anticipated Caregiver: Theodis Aguas (son) Anticipated Caregiver's Contact Information: [phone] Caregiver Availability: 24/7 Discharge Plan Discussed with Primary Caregiver: Yes Is Caregiver In Agreement with Plan?: Yes Does Caregiver/Family have Issues with Lodging/Transportation while Pt is in Rehab?: No   Goals Patient/Family Goal for Rehab: PT/OT/SLP Supervision Expected length of stay: 12-14 days Pt/Family Agrees to Admission and willing to participate: Yes Program Orientation Provided & Reviewed with Pt/Caregiver Including Roles  & Responsibilities: Yes   Decrease burden of Care through IP rehab admission: Specialzed equipment needs, Decrease number of caregivers, and Patient/family education   Possible  need for SNF placement upon discharge: not anticipated   Patient Condition: I have reviewed medical records from [facility], spoken with CM, and patient and son. I met with patient at the bedside for inpatient rehabilitation assessment.  Patient will benefit from ongoing PT, OT, and SLP, can actively participate in  3 hours of therapy a day 5 days of the week, and can make measurable gains during the admission.  Patient will also benefit from the coordinated team approach during an Inpatient Acute Rehabilitation admission.  The patient will receive intensive therapy as well as Rehabilitation physician, nursing, social worker, and care management interventions.  Due to safety, disease management, medication administration, pain management, and patient education the patient requires 24 hour a day rehabilitation nursing.  The patient is currently Min A with mobility and basic ADLs.  Discharge setting and therapy post discharge at home with home health is anticipated.  Patient has agreed to participate in the Acute Inpatient Rehabilitation Program and will admit today.   Preadmission Screen Completed By:  Genella Mech, 09/15/2020 12:14 PM ______________________________________________________________________   Discussed status with Dr. Posey Pronto  on 09/18/20 at 64 and received approval for admission today.   Admission Coordinator:  Genella Mech, CCC-SLP, time 1040/Date 09/18/20    Assessment/Plan: Diagnosis: acute cerebellar infarct Does the need for close, 24 hr/day Medical supervision in concert with the patient's rehab needs make it unreasonable for this patient to be served in a less intensive setting? Yes Co-Morbidities requiring supervision/potential complications: marijuana abuse (counsel when appropriate), Tachycardia (monitor in accordance with pain and increasing activity), DM (Monitor in accordance with exercise and adjust meds as necessary) Due to bladder management, bowel management, disease management, and patient education, does the patient require 24 hr/day rehab nursing? Yes Does the patient require coordinated care of a physician, rehab nurse, PT, OT, and SLP to address physical and functional deficits in the context of the above medical diagnosis(es)? Yes Addressing deficits in the following areas:  balance, endurance, locomotion, strength, transferring, bathing, dressing, toileting, cognition, and psychosocial support Can the patient actively participate in an intensive therapy program of at least 3 hrs of therapy 5 days a week? Yes The potential for patient to make measurable gains while on inpatient rehab is excellent Anticipated functional outcomes upon discharge from inpatient rehab: supervision PT, supervision OT, modified independent and supervision SLP Estimated rehab length of stay to reach the above functional goals is: 10-14 days. Anticipated discharge destination: Home 10. Overall Rehab/Functional Prognosis: good     MD Signature: Delice Lesch, MD, ABPMR

## 2020-09-18 NOTE — Progress Notes (Signed)
Inpatient Rehabilitation Medication Review by a Pharmacist  A complete drug regimen review was completed for this patient to identify any potential clinically significant medication issues.  Clinically significant medication issues were identified:  yes   Type of Medication Issue Identified Description of Issue Urgent (address now) Non-Urgent (address on AM team rounds) Plan Plan Accepted by Provider? (Yes / No / Pending AM Rounds)  Drug Interaction(s) (clinically significant)       Duplicate Therapy       Allergy       No Medication Administration End Date       Incorrect Dose       Additional Drug Therapy Needed  Metformin '500mg'$  ER po daily to be continued per D/C note and not reordered   To address at AM rounds   Other         Name of provider notified for urgent issues identified:   Provider Method of Notification:    For non-urgent medication issues to be resolved on team rounds tomorrow morning a [facility] Secure Chat Handoff was sent to:    Pharmacist comments:   Time spent performing this drug regimen review (minutes):  15   Jguadalupe Opiela 09/18/2020 5:12 PM

## 2020-09-19 ENCOUNTER — Encounter (HOSPITAL_COMMUNITY): Payer: Self-pay | Admitting: Physical Medicine and Rehabilitation

## 2020-09-19 DIAGNOSIS — I639 Cerebral infarction, unspecified: Secondary | ICD-10-CM

## 2020-09-19 LAB — CBC WITH DIFFERENTIAL/PLATELET
Abs Immature Granulocytes: 0.02 10*3/uL (ref 0.00–0.07)
Basophils Absolute: 0.1 10*3/uL (ref 0.0–0.1)
Basophils Relative: 1 %
Eosinophils Absolute: 0.3 10*3/uL (ref 0.0–0.5)
Eosinophils Relative: 5 %
HCT: 47.4 % (ref 39.0–52.0)
Hemoglobin: 15.9 g/dL (ref 13.0–17.0)
Immature Granulocytes: 0 %
Lymphocytes Relative: 25 %
Lymphs Abs: 1.3 10*3/uL (ref 0.7–4.0)
MCH: 31.4 pg (ref 26.0–34.0)
MCHC: 33.5 g/dL (ref 30.0–36.0)
MCV: 93.7 fL (ref 80.0–100.0)
Monocytes Absolute: 0.9 10*3/uL (ref 0.1–1.0)
Monocytes Relative: 18 %
Neutro Abs: 2.6 10*3/uL (ref 1.7–7.7)
Neutrophils Relative %: 51 %
Platelets: 366 10*3/uL (ref 150–400)
RBC: 5.06 MIL/uL (ref 4.22–5.81)
RDW: 12.9 % (ref 11.5–15.5)
WBC: 5.1 10*3/uL (ref 4.0–10.5)
nRBC: 0 % (ref 0.0–0.2)

## 2020-09-19 LAB — COMPREHENSIVE METABOLIC PANEL
ALT: 100 U/L — ABNORMAL HIGH (ref 0–44)
AST: 51 U/L — ABNORMAL HIGH (ref 15–41)
Albumin: 4.1 g/dL (ref 3.5–5.0)
Alkaline Phosphatase: 48 U/L (ref 38–126)
Anion gap: 11 (ref 5–15)
BUN: 21 mg/dL — ABNORMAL HIGH (ref 6–20)
CO2: 24 mmol/L (ref 22–32)
Calcium: 10.3 mg/dL (ref 8.9–10.3)
Chloride: 99 mmol/L (ref 98–111)
Creatinine, Ser: 1.18 mg/dL (ref 0.61–1.24)
GFR, Estimated: >60 mL/min (ref >60)
Glucose, Bld: 122 mg/dL — ABNORMAL HIGH (ref 70–99)
Potassium: 4.6 mmol/L (ref 3.5–5.1)
Sodium: 134 mmol/L — ABNORMAL LOW (ref 135–145)
Total Bilirubin: 0.6 mg/dL (ref 0.3–1.2)
Total Protein: 8.1 g/dL (ref 6.5–8.1)

## 2020-09-19 LAB — GLUCOSE, CAPILLARY
Glucose-Capillary: 104 mg/dL — ABNORMAL HIGH (ref 70–99)
Glucose-Capillary: 92 mg/dL (ref 70–99)
Glucose-Capillary: 99 mg/dL (ref 70–99)
Glucose-Capillary: 161 mg/dL — ABNORMAL HIGH (ref 70–99)

## 2020-09-19 NOTE — Evaluation (Signed)
Occupational Therapy Assessment and Plan  Patient Details  Name: Malik Jones MRN: [id number] Date of Birth: Jun 30, 1960  OT Diagnosis: ataxia and hemiplegia affecting dominant side Rehab Potential:   ELOS: 1 week   Today's Date: 09/19/2020 OT Individual Time: 0757-0900 OT Individual Time Calculation (min): 63 min     Hospital Problem: Principal Problem:   Cerebellar stroke [facility])   Past Medical History:  Past Medical History:  Diagnosis Date   Medical history non-contributory    Past Surgical History:  Past Surgical History:  Procedure Laterality Date   MANDIBLE FRACTURE SURGERY     NO PAST SURGERIES      Assessment & Plan Clinical Impression: Patient is a 60 y.o. year old male with dizziness, problems with coordination on the right and numbness LUE/LLE and falls.  UDS positive for cannabinoids. CT head showed acute/subacute nonhemorrhagic right superior cerebellar infarct and remote. 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  2D echo showed EF 70-75%.  Dr. 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  Patient is without any clinical signs of pneumonia and being monitored.     Dr. Stanford Breed was consulted for input continued anticoagulation and as patient is showing neurological recovery, he recommended nonurgent cardiac surgery evaluation.  Dr. Roxan Hockey was consulted for input and recommended repeat CTA chest which was done on  09/16/2020 showing previously identified adherent thrombus, no longer visualized with no other filling defects and thoracic aorta or within heart (and nodular  consolidation centered in bilateral upper lobes with reactive mediastinal lymphadenopathy likely due to infectious pneumonia with recommendations for follow-up CT chest in 2 to 3 months to monitor for resolution).  He did not feel surgical intervention needed as thrombus had resolved, anticoagulation with repeat CT in 3 months to monitor adenopathy as well as hematology consult to rule out hypercoagulable state.  Therapy ongoing and patient limited by right sided hemiparesis with ataxia, left sensory deficits as well as higher level cognitive deficits. CIR recommended due to functional decline.  Patient transferred to CIR on 09/18/2020 .    Patient currently requires min with basic self-care skills and IADL secondary to muscle weakness, ataxia and decreased coordination, and decreased standing balance, hemiplegia, and decreased balance strategies.  Prior to hospitalization, patient could complete ADL/IADL with independent .  Patient will benefit from skilled intervention to increase independence with basic self-care skills and increase level of independence with iADL prior to discharge home with care partner.  Anticipate patient will require intermittent supervision and follow up outpatient.  OT - End of Session Activity Tolerance: Endurance does not limit participation in activity OT Assessment OT Patient demonstrates impairments in the following area(s): Balance;Motor OT Basic ADL's Functional Problem(s): Grooming;Bathing;Dressing;Toileting OT Advanced ADL's Functional Problem(s): Simple Meal Preparation OT Transfers Functional Problem(s): Toilet;Tub/Shower OT Additional Impairment(s): Fuctional Use of Upper Extremity OT Plan OT Intensity: Minimum of 1-2 x/day, 45 to 90 minutes OT Frequency: 5 out of 7 days OT Duration/Estimated Length of Stay: 1 week OT Treatment/Interventions: Balance/vestibular training;Neuromuscular re-education;Self Care/advanced ADL retraining;DME/adaptive equipment  instruction;Patient/family education;UE/LE Coordination activities;Discharge planning;Functional mobility training;Therapeutic Activities OT Self Feeding Anticipated Outcome(s): independent OT Basic Self-Care Anticipated Outcome(s): set up  OT Toileting Anticipated Outcome(s): supervision OT Bathroom Transfers Anticipated Outcome(s): supervision OT Recommendation Patient destination: Home Follow Up Recommendations: Outpatient OT Equipment Recommended: 3 in 1 bedside comode;Tub/shower bench   OT Evaluation Precautions/Restrictions  Precautions Precautions: Fall Precaution Comments: ataxia Restrictions Weight Bearing Restrictions: No General   Vital Signs Therapy Vitals Temp: 98.2 F (36.8 C) Temp Source: Oral Pulse Rate: 96 Resp: 18 BP: (!) 135/96 Patient Position (if appropriate): Lying Oxygen Therapy SpO2: 99 % O2 Device: Room Air Pain Pain Assessment Pain Scale: 0-10 Pain Score: 0-No pain Home Living/Prior Functioning Home Living Family/patient expects to be discharged to:: Private residence Living Arrangements: Alone Available Help at Discharge: Family, Available 24 hours/day Type of Home: House Home Access: Stairs to enter Technical brewer of Steps: 4-5 Entrance Stairs-Rails: None Home Layout: One level Bathroom Shower/Tub: Multimedia programmer: Standard  Lives With: Alone IADL History Education: HS, worked in Copywriter, 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 Vision Baseline Vision/History: Wears glasses Wears Glasses: Reading only Patient Visual Report: No change from baseline Vision Assessment?: Yes Eye Alignment: Within Functional Limits Ocular Range of Motion: Within Functional Limits Alignment/Gaze  Preference: Within Defined Limits Tracking/Visual Pursuits: Able to track stimulus in all quads without difficulty Saccades: Within functional limits Convergence: Within functional limits Visual Fields: No apparent deficits Perception  Perception: Within Functional Limits Praxis Praxis: Intact Cognition Overall Cognitive Status: Within Functional Limits for tasks assessed Arousal/Alertness: Awake/alert Orientation Level: Person;Place;Situation Person: Oriented Place: Oriented Situation: Oriented Year: 2022 Month: August Day of Week: Correct Memory: Appears intact Immediate Memory Recall: Sock;Blue;Bed Memory Recall Sock: Without Cue Memory Recall Blue: Without Cue Memory Recall Bed: Not able to recall Awareness: Appears intact Problem Solving: Appears intact Safety/Judgment: Appears intact Sensation Sensation Light Touch: Appears Intact Hot/Cold: Appears Intact Proprioception: Appears Intact Coordination Gross Motor Movements are Fluid and Coordinated: No Fine Motor Movements are Fluid and Coordinated: No Finger Nose Finger Test: moderate dysmetria right 9 Hole Peg Test: R = 51.5 sec, L = 21.5 sec       box and blocks:   R = 32, L = 62 Motor  Motor Motor: Ataxia;Hemiplegia  Trunk/Postural Assessment  Postural Control Postural Control: Deficits on evaluation Righting Reactions: limited by weakness on right side  Balance Balance Balance Assessed: Yes Standardized Balance Assessment Standardized Balance Assessment: (P) Berg Balance Test;Dynamic Gait Index Berg Balance Test Sit to Stand: Able to stand without using hands and stabilize independently Standing Unsupported: Able to stand safely 2 minutes Sitting with Back Unsupported but Feet Supported on Floor or Stool: Able to sit safely and securely 2 minutes Stand to Sit: Sits safely with minimal use of hands Transfers: Able to transfer safely, minor use of hands Standing Unsupported with Eyes Closed: Able to stand  10 seconds with supervision Standing Ubsupported with Feet Together: Needs help to attain position and unable to hold for 15 seconds From Standing, Reach Forward with Outstretched Arm: Can reach forward >12 cm safely (5") From Standing Position, Pick up Object from Floor: Able to pick up shoe, needs supervision From Standing Position, Turn to Look Behind Over each Shoulder: Looks behind one side only/other side shows less weight shift Turn 360 Degrees: Needs assistance while turning Standing Unsupported, Alternately Place Feet on Step/Stool: Able to complete >2 steps/needs minimal assist Standing Unsupported,  One Foot in Front: Loses balance while stepping or standing Standing on One Leg: Unable to try or needs assist to prevent fall Total Score: 33 Static Sitting Balance Static Sitting - Level of Assistance: 7: Independent Dynamic Sitting Balance Dynamic Sitting - Balance Support: During functional activity Dynamic Sitting - Level of Assistance: 5: Stand by assistance Dynamic Sitting - Balance Activities: Reaching for objects Static Standing Balance Static Standing - Balance Support: During functional activity Static Standing - Level of Assistance: 5: Stand by assistance Dynamic Standing Balance Dynamic Standing - Balance Support: During functional activity Dynamic Standing - Level of Assistance: 4: Min assist Extremity/Trunk Assessment RUE Assessment RUE Assessment: Within Functional Limits LUE Assessment LUE Assessment: Within Functional Limits  Care Tool Care Tool Self Care Eating   Eating Assist Level: Set up assist    Oral Care    Oral Care Assist Level: Contact Guard/Toucning assist    Bathing   Body parts bathed by patient: Right arm;Left arm;Chest;Abdomen;Front perineal area;Buttocks;Right upper leg;Left upper leg;Right lower leg;Left lower leg;Face     Assist Level: Contact Guard/Touching assist    Upper Body Dressing(including orthotics)   What is the patient  wearing?: Pull over shirt   Assist Level: Supervision/Verbal cueing    Lower Body Dressing (excluding footwear)   What is the patient wearing?: Pants Assist for lower body dressing: Contact Guard/Touching assist    Putting on/Taking off footwear   What is the patient wearing?: Non-skid slipper socks Assist for footwear: Supervision/Verbal cueing       Care Tool Toileting Toileting activity   Assist for toileting: Contact Guard/Touching assist     Care Tool Bed Mobility Roll left and right activity        Sit to lying activity        Lying to sitting edge of bed activity         Care Tool Transfers Sit to stand transfer        Chair/bed transfer         Toilet transfer   Assist Level: Contact Guard/Touching assist     Care Tool Cognition Expression of Ideas and Wants Expression of Ideas and Wants: Without difficulty (complex and basic) - expresses complex messages without difficulty and with speech that is clear and easy to understand   Understanding Verbal and Non-Verbal Content Understanding Verbal and Non-Verbal Content: Understands (complex and basic) - clear comprehension without cues or repetitions   Memory/Recall Ability *first 3 days only Memory/Recall Ability *first 3 days only: Current season;Location of own room;That he or she is in a hospital/hospital unit    Refer to Care Plan for [place] 1 OT Short Term Goal 1 (Week 1): STG = LTG due to estimated LOS  Recommendations for other services: None    Skilled Therapeutic Intervention   Patient in bed, alert and cooperative.  He denies pain and states that he is anxious to go home.  Evaluation completed as documented above - he presents with right side ataxia, balance impairment limiting independence and safety with adl/iadl and mobility at this time.  Reviewed role of OT and plan of care.   Reviewed goals for therapy and DME options to promote safety.  He participated in  adl and mobility training as documented below.  Recommend ongoing therapy to promote safety and independence.  He returned to bed at close of session, bed alarm set and call bell in hand.     ADL ADL Eating: Set  up Where Assessed-Eating: Bed level Grooming: Contact guard Where Assessed-Grooming: Standing at sink Upper Body Bathing: Setup Where Assessed-Upper Body Bathing: Shower Lower Body Bathing: Contact guard Where Assessed-Lower Body Bathing: Shower Upper Body Dressing: Setup Where Assessed-Upper Body Dressing: Chair Lower Body Dressing: Contact guard Where Assessed-Lower Body Dressing: Chair Toileting: Contact guard Where Assessed-Toileting: Glass blower/designer: Therapist, music Method: Counselling psychologist: Grab bars;Raised Counselling psychologist: Curator Method: Heritage manager: Manufacturing systems engineer  Bed Mobility Bed Mobility: Supine to Sit;Sit to Supine Rolling Right: Independent Supine to Sit: Independent with assistive device Sit to Supine: Independent with assistive device Transfers Sit to Stand: Supervision/Verbal cueing Stand to Sit: Supervision/Verbal cueing   Discharge Criteria: Patient will be discharged from OT if patient refuses treatment 3 consecutive times without medical reason, if treatment goals not met, if there is a change in medical status, if patient makes no progress towards goals or if patient is discharged from hospital.  The above assessment, treatment plan, treatment alternatives and goals were discussed and mutually agreed upon: by patient  Carlos Levering 09/19/2020, 3:39 PM

## 2020-09-19 NOTE — Progress Notes (Signed)
Inpatient [facility] Individual Statement of Services  Patient Name:  Malik Jones  Date:  09/19/2020  Welcome to the [facility].  Our goal is to provide you with an individualized program based on your diagnosis and situation, designed to meet your specific needs.  With this comprehensive rehabilitation program, you will be expected to participate in at least 3 hours of rehabilitation therapies Monday-Friday, with modified therapy programming on the weekends.  Your rehabilitation program will include the following services:  Physical Therapy (PT), Occupational Therapy (OT), Speech Therapy (ST), 24 hour per day rehabilitation nursing, Neuropsychology, Care Coordinator, Rehabilitation Medicine, Nutrition Services, and Pharmacy Services  Weekly team conferences will be held on Tuesday to discuss your progress.  Your Inpatient Rehabilitation Care Coordinator will talk with you frequently to get your input and to update you on team discussions.  Team conferences with you and your family in attendance may also be held.  Expected length of stay: 7-9 days  Overall anticipated outcome: independent with device-supervision for steps and tub transfer  Depending on your progress and recovery, your program may change. Your Inpatient Rehabilitation Care Coordinator will coordinate services and will keep you informed of any changes. Your Inpatient Rehabilitation Care Coordinator's name and contact numbers are listed  below.  The following services may also be recommended but are not provided by the [facility] will be made to provide these services after discharge if needed.  Arrangements include referral to agencies that provide these services.  Your insurance has been verified to be:  [place] Your primary doctor is:    Publishing copy  Pertinent information will be shared with your doctor and your insurance company.  Inpatient Rehabilitation Care Coordinator:  Ovidio Kin, [place] or [phone]  Information discussed with and copy given to patient by: Elease Hashimoto, 09/19/2020, 11:13 AM

## 2020-09-19 NOTE — Progress Notes (Signed)
Inpatient Rehabilitation  Patient information reviewed and entered into eRehab system by Catilyn Boggus Shonita Rinck, OTR/L.   Information including medical coding, functional ability and quality indicators will be reviewed and updated through discharge.    

## 2020-09-19 NOTE — Plan of Care (Signed)
  Problem: RH Balance Goal: LTG Patient will maintain dynamic standing balance (PT) Description: LTG:  Patient will maintain dynamic standing balance with assistance during mobility activities (PT) Flowsheets (Taken 09/19/2020 1901) LTG: Pt will maintain dynamic standing balance during mobility activities with:: Independent with assistive device    Problem: Sit to Stand Goal: LTG:  Patient will perform sit to stand with assistance level (PT) Description: LTG:  Patient will perform sit to stand with assistance level (PT) Flowsheets (Taken 09/19/2020 1901) LTG: PT will perform sit to stand in preparation for functional mobility with assistance level: Independent with assistive device   Problem: RH Bed Mobility Goal: LTG Patient will perform bed mobility with assist (PT) Description: LTG: Patient will perform bed mobility with assistance, with/without cues (PT). Flowsheets (Taken 09/19/2020 1901) LTG: Pt will perform bed mobility with assistance level of: Independent   Problem: RH Bed to Chair Transfers Goal: LTG Patient will perform bed/chair transfers w/assist (PT) Description: LTG: Patient will perform bed to chair transfers with assistance (PT). Flowsheets (Taken 09/19/2020 1901) LTG: Pt will perform Bed to Chair Transfers with assistance level: Independent with assistive device    Problem: RH Car Transfers Goal: LTG Patient will perform car transfers with assist (PT) Description: LTG: Patient will perform car transfers with assistance (PT). Flowsheets (Taken 09/19/2020 1901) LTG: Pt will perform car transfers with assist:: Independent with assistive device    Problem: RH Floor Transfers Goal: LTG Patient will perform floor transfers w/assist (PT) Description: LTG: Patient will perform floor transfers with assistance (PT). Flowsheets (Taken 09/19/2020 1901) LTG: PT WILL PERFORM FLOOR TRANFERS  WITH  ASSIST:: Supervision/Verbal cueing   Problem: RH Ambulation Goal: LTG Patient will  ambulate in controlled environment (PT) Description: LTG: Patient will ambulate in a controlled environment, # of feet with assistance (PT). Flowsheets (Taken 09/19/2020 1901) LTG: Pt will ambulate in controlled environ  assist needed:: Independent with assistive device LTG: Ambulation distance in controlled environment: 250 Goal: LTG Patient will ambulate in home environment (PT) Description: LTG: Patient will ambulate in home environment, # of feet with assistance (PT). Flowsheets (Taken 09/19/2020 1901) LTG: Pt will ambulate in home environ  assist needed:: Independent with assistive device LTG: Ambulation distance in home environment: 150   Problem: RH Stairs Goal: LTG Patient will ambulate up and down stairs w/assist (PT) Description: LTG: Patient will ambulate up and down # of stairs with assistance (PT) Flowsheets (Taken 09/19/2020 1901) LTG: Pt will ambulate up/down stairs assist needed:: Independent with assistive device LTG: Pt will  ambulate up and down number of stairs: 4

## 2020-09-19 NOTE — Progress Notes (Signed)
PROGRESS NOTE   Subjective/Complaints: No complaints this morning Denies pain Labs stable Denies constipation  ROS: Denies pain   Objective:   No results found. Recent Labs    09/18/20 0330 09/19/20 0543  WBC 4.5 5.1  HGB 15.0 15.9  HCT 44.7 47.4  PLT 355 366   Recent Labs    09/19/20 0543  NA 134*  K 4.6  CL 99  CO2 24  GLUCOSE 122*  BUN 21*  CREATININE 1.18  CALCIUM 10.3    Intake/Output Summary (Last 24 hours) at 09/19/2020 1142 Last data filed at 09/19/2020 0547 Gross per 24 hour  Intake 177 ml  Output 1225 ml  Net -1048 ml        Physical Exam: Vital Signs Blood pressure (!) 139/100, pulse 79, temperature 98.4 F (36.9 C), resp. rate 18, height '5\' 6"'$  (1.676 m), weight 64.4 kg, SpO2 100 %. Gen: no distress, normal appearing HEENT: oral mucosa pink and moist, NCAT Cardio: Reg rate Chest: normal effort, normal rate of breathing Abd: soft, non-distended Ext: no edema Psych: pleasant, normal affect Skin: intact Neurological:     General: No focal deficit present.     Mental Status: He is alert and oriented to person, place, and time.     Comments: Mild dysarthira Alert Horizontal nystagmus without dizziness or diplopia. Ataxia with decreased coordination RUE>RLE. Sensory deficits LUE/LLE --improving.  RUE: 4+/5 proximal to distal with atxtia LUE/LLE: 5/5 proximal to distal  Psychiatric:        Mood and Affect: Mood normal.        Behavior: Behavior normal.    Assessment/Plan: 1. Functional deficits which require 3+ hours per day of interdisciplinary therapy in a comprehensive inpatient rehab setting. Physiatrist is providing close team supervision and 24 hour management of active medical problems listed below. Physiatrist and rehab team continue to assess barriers to discharge/monitor patient progress toward functional and medical goals  Care Tool:  Bathing              Bathing  assist       Upper Body Dressing/Undressing Upper body dressing        Upper body assist Assist Level: Contact Guard/Touching assist    Lower Body Dressing/Undressing Lower body dressing            Lower body assist       Toileting Toileting    Toileting assist Assist for toileting: Independent with assistive device Assistive Device Comment: urinal   Transfers Chair/bed transfer  Transfers assist           Locomotion Ambulation   Ambulation assist              Walk 10 feet activity   Assist           Walk 50 feet activity   Assist           Walk 150 feet activity   Assist           Walk 10 feet on uneven surface  activity   Assist           Wheelchair     Assist  Wheelchair 50 feet with 2 turns activity    Assist            Wheelchair 150 feet activity     Assist          Blood pressure (!) 139/100, pulse 79, temperature 98.4 F (36.9 C), resp. rate 18, height '5\' 6"'$  (1.676 m), weight 64.4 kg, SpO2 100 %.  Medical Problem List and Plan: 1.  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             -patient may not shower             -ELOS/Goals: 7-010 daysn/Mod I             Check vitamin D level tomorrow.  2.  Antithrombotics: -DVT/anticoagulation:  Pharmaceutical: Other (comment)--Eliquis             -antiplatelet therapy: N/A 3. Pain Management: Tylenol prn             Monitor with increased ecterntion. 4. Mood: LCSW to follow for evaluation and support.              -antipsychotic agents: N/a 5. Neuropsych: This patient is capable of making decisions on his own behalf. 6. Skin/Wound Care: Routine pressure relief measures.  7. Fluids/Electrolytes/Nutrition: Monitor I/O.                CMP ordered for tomorrow AM.  8.  HTN: Monitor blood pressures 3 times  daily.   --Continue amlodipine and HCTZ   -Magnesium and potassium levels reviewed and stable.             Monitor with increased mobility.  9.  New onset T2DM: Hemoglobin A1c 6.5.  RD consult to educate patient on CM diet.  --will start monitoring BS ac/hs and use SSI for elevated BS --d/c Glucerna             Monitor with increased activity 10. Transaminitis: discontinue Tylenol and repeat tomorrow.     LOS: 1 days A FACE TO FACE EVALUATION WAS PERFORMED  Malik Jones 09/19/2020, 11:42 AM

## 2020-09-19 NOTE — Plan of Care (Signed)
  Problem: Consults Goal: RH STROKE PATIENT EDUCATION Description: See Patient Education module for education specifics  Outcome: Progressing   Problem: RH BOWEL ELIMINATION Goal: RH STG MANAGE BOWEL WITH ASSISTANCE Description: STG Manage Bowel with mod I Assistance. Outcome: Progressing Goal: RH STG MANAGE BOWEL W/MEDICATION W/ASSISTANCE Description: STG Manage Bowel with Medication with mod I Assistance. Outcome: Progressing   Problem: RH SAFETY Goal: RH STG ADHERE TO SAFETY PRECAUTIONS W/ASSISTANCE/DEVICE Description: STG Adhere to Safety Precautions With cues/reminders Assistance/Device. Outcome: Progressing   Problem: RH KNOWLEDGE DEFICIT Goal: RH STG INCREASE KNOWLEDGE OF DIABETES Description: Patient will be able to manage DM with medications and dietary modifications using handouts and educational tools independently Outcome: Progressing Goal: RH STG INCREASE KNOWLEDGE OF HYPERTENSION Description: Patient will be able to manage HTN with medications and dietary modifications using handouts and educational tools independently Outcome: Progressing Goal: RH STG INCREASE KNOWLEGDE OF HYPERLIPIDEMIA Description: Patient will be able to manage HLD with medications and dietary modifications using handouts and educational tools independently Outcome: Progressing Goal: RH STG INCREASE KNOWLEDGE OF STROKE PROPHYLAXIS Description: Patient will be able to manage secondary stroke risks with medications and dietary modifications using handouts and educational tools independently Outcome: Progressing

## 2020-09-19 NOTE — Evaluation (Signed)
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 Cognitive impairment consists of difficulties with memory and questionable impaired higher level cognitive function (SLP to continue to fully assess). Patient did seem to indicate that he was having some memory difficulties prior to CVA, but this was not entirely clear. Speech was in the mild-moderate range for dysarthria with intelligibilty decreasing significantly when SLP not looking at patient face to face and when context was not known or unclear. Patient does speak at a rapid rate which he reports is premorbid. In addition, he does not have any front teeth and he reports he is planning to get a partial at some point. This results in imprecise lingual, interdental phoneme production. He did inform SLP that he has been talking to friends and family on the phone and they have all told him that his speech is sounding better and better each day. Patient will benefit from short term SLP intevention for complex level cognitive and speech production but SLP is not anticipating need for formal SLP intervention upon discharge from CIR.  Skilled Therapeutic Interventions          SLE  SLP Assessment  Patient will need skilled [place] Pathology  Services during CIR admission    Recommendations  Recommendations for Other Services: Neuropsych consult Patient destination: Home Follow up Recommendations: None Equipment Recommended: None recommended by SLP    SLP Frequency 3 to 5 out of 7 days   SLP Duration  SLP Intensity  SLP Treatment/Interventions 7-10 days  Minumum of 1-2 x/day, 30 to 90 minutes  Cognitive remediation/compensation;Patient/family education;Functional tasks    Pain Pain Assessment Pain Scale: 0-10 Pain Score: 0-No pain  Prior Functioning Cognitive/Linguistic Baseline: Within functional limits Type of Home: House  Lives With: Alone Available Help at Discharge: Family;Available 24 hours/day Education: HS, worked in Copywriter, 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 Vocation: Full time employment  SLP Evaluation Cognition Overall Cognitive Status: Impaired/Different from baseline Arousal/Alertness: Awake/alert Orientation Level: Oriented X4 Attention: Selective Sustained Attention: Appears intact Selective Attention: Appears intact Memory: Impaired Memory Impairment: Storage deficit;Other (comment) (recalled 2/4 words after 2 minute delay without cues, recalled 3/4 with semantic cues) Awareness: Appears intact Problem Solving: Appears intact (SLP to assess higher level cognitive function) Safety/Judgment: Appears intact  Comprehension Auditory Comprehension Overall Auditory Comprehension: Appears within functional limits for tasks assessed Expression Expression Primary Mode of Expression: Verbal Verbal Expression Overall Verbal Expression: Appears within functional limits for tasks assessed Oral Motor Oral Motor/Sensory Function Overall Oral Motor/Sensory Function: Mild impairment Facial ROM: Within Functional Limits Facial Symmetry: Within Functional Limits Facial Strength: Within Functional Limits Facial Sensation: Reduced left Lingual ROM: Within Functional Limits Lingual  Symmetry: Within Functional Limits Lingual Strength: Within Functional Limits Lingual Sensation: Within Functional Limits Velum: Within Functional Limits Mandible: Within Functional Limits Motor  Speech Overall Motor Speech: Impaired Respiration: Within functional limits Level of Impairment: Conversation Phonation: Normal Resonance: Within functional limits Articulation: Impaired Level of Impairment: Sentence Intelligibility: Intelligibility reduced Word: 75-100% accurate Phrase: 75-100% accurate Sentence: 75-100% accurate Conversation: 50-74% accurate Motor Planning: Witnin functional limits Motor Speech Errors: Not applicable Effective Techniques: Slow rate;Over-articulate  Care Tool Care Tool Cognition Expression of Ideas and Wants Expression of Ideas and Wants: Without difficulty (complex and basic) - expresses complex messages without difficulty and with speech that is clear and easy to understand   Understanding Verbal and Non-Verbal Content Understanding Verbal and Non-Verbal Content: Understands (complex and basic) - clear comprehension without cues or repetitions   Memory/Recall Ability *first 3 days only Memory/Recall Ability *first 3 days only: Current season;Location of own room;That he or she is in a hospital/hospital unit     PMSV Assessment  PMSV Trial Intelligibility: Intelligibility reduced Word: 75-100% accurate Phrase: 75-100% accurate Sentence: 75-100% accurate Conversation: 50-74% accurate  Bedside Swallowing Assessment General    Oral Care Assessment   Ice Chips   Thin Liquid   Nectar Thick   Honey Thick   Puree   Solid   BSE Assessment    Short Term Goals: Week 1: SLP Short Term Goal 1 (Week 1): Patient will achieve 90% intelligiblity during unstructured conversation and oral reading at paragraph level with minA cues. SLP Short Term Goal 2 (Week 1): Patient will participate in testing of higher level cognitive function. SLP Short  Term Goal 3 (Week 1): Patient will utilize learned strategies for memory and be able to to recall and describe recent therapeutic (PT, OT, ST) and medical interventions/learned strategies/learned exercises with supervision A.  Refer to Care Plan for Long Term Goals  Recommendations for other services: Neuropsych  Discharge Criteria: Patient will be discharged from SLP if patient refuses treatment 3 consecutive times without medical reason, if treatment goals not met, if there is a change in medical status, if patient makes no progress towards goals or if patient is discharged from hospital.  The above assessment, treatment plan, treatment alternatives and goals were discussed and mutually agreed upon: by patient  Sonia Baller, MA, CCC-SLP Speech Therapy

## 2020-09-19 NOTE — Progress Notes (Signed)
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 He has never used smokeless tobacco. He reports current alcohol use. He reports current drug use. Drug: Marijuana.  Family / Support Systems Marital Status: Single Patient Roles: Parent, Other (Comment) (employee) Children: Leia Alf [phone] Other Supports: Rodena Medin [phone]  Tommy-brother [phone] Anticipated Caregiver: Different family members-Jamil Ability/Limitations of Caregiver: Will have intermittent assist from all does not have 24/7 care Caregiver Availability: Intermittent Family Dynamics: Close knit family who pull together in times of stress and loss. Pt reports his Mom died recently and this was stressful. He is grateful to have them but wants to be independent upon DC from here.  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 He plans to slow down and not stress so much now. He has always been independent and is not used to relying upon others. Recent Psychosocial Issues: Pt felt he was healthy but was grieving the loss of his Mom Psychiatric History: No history deferred depression screen to allow him to adjust to rehab and rountine, but do feel he would benefit from seeing neuro-psych while here for coping. Substance Abuse History: THC-pt was not aware of the health risks with this, he plans to quit this. Expressed no other issues  Patient / Family Perceptions, Expectations & Goals Pt/Family understanding of illness & functional limitations: Pt and son are able to explain his stroke and deficits, both talk with the MD and feel have a good understanding of his treatment plan going forward. Pt wants to do well here Premorbid pt/family roles/activities: father, sibling, uncle, employee, friend, etc Anticipated changes in roles/activities/participation: resume Pt/family expectations/goals: Pt states: " I want to be able to do for myself when I leave here."  Son states: " I hope he does well he really wants too and is not one to ask for help."  [employer]: None Premorbid Home Care/DME Agencies: None Transportation available at discharge: Family-pt did drive prior to admission Resource referrals recommended: Neuropsychology  Discharge Planning Living Arrangements: Alone Support Systems: Children, Other relatives, Friends/neighbors Type of Residence: Private residence Insurance Resources: Multimedia programmer (specify) Nurse, mental health) Financial Resources: Employment Museum/gallery curator Screen Referred: No Living Expenses: Education officer, community Management: Patient Does the patient have any problems obtaining your medications?: No Home Management: Self Patient/Family Preliminary  Plans: Return back home with family members checking in on him, there may be times he  is alone so hopefully he can reach mod/i level. He is recovering and mobile but still requiring assist. Will await therapy evaluations and work on discharge needs. Care Coordinator Barriers to Discharge: Lack of/limited family support, Medication compliance Care Coordinator Anticipated Follow Up Needs: HH/OP  Clinical Impression Pleasant gentleman who is motivated to do well and return back to work if able. He is one who has always been independent and self sufficient. He has good family supports. Will need PCP at discharge and will work on discharge needs.Would benefit from neuro-psych while here.  Elease Hashimoto 09/19/2020, 10:09 AM

## 2020-09-19 NOTE — Evaluation (Signed)
Physical Therapy Assessment and Plan  Patient Details  Name: Malik Jones MRN: [id number] Date of Birth: March 01, 1960  PT Diagnosis: Abnormality of gait, Ataxia, Ataxic gait, Coordination disorder, Difficulty walking, Hemiparesis dominant, and Impaired sensation Rehab Potential:   ELOS: 7-10 days   Today's Date: 09/19/2020 PT Individual Time: 4128-7867 PT Individual Time Calculation (min): 56 min    Hospital Problem: Principal Problem:   Cerebellar stroke [facility])   Past Medical History:  Past Medical History:  Diagnosis Date   Medical history non-contributory    Past Surgical History:  Past Surgical History:  Procedure Laterality Date   MANDIBLE FRACTURE SURGERY     NO PAST SURGERIES      Assessment & Plan Clinical Impression: Malik Jones is a 60 year old male who was admitted on 09/12/20 to [facility] with dizziness, problems with coordination on the right and numbness LUE/LLE and falls.  UDS positive for cannabinoids. CT head showed acute/subacute nonhemorrhagic right superior cerebellar infarct and remote. 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  2D echo showed EF 70-75%.  Dr. 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  Patient is without any clinical signs of pneumonia and being monitored.     Dr. Stanford Breed was consulted for input continued anticoagulation and as patient is showing neurological recovery, he recommended nonurgent cardiac surgery evaluation.  Dr. Roxan Hockey was consulted for input and recommended repeat CTA chest which was done on  09/16/2020 showing previously identified  adherent thrombus, no longer visualized with no other filling defects and thoracic aorta or within heart (and nodular consolidation centered in bilateral upper lobes with reactive mediastinal lymphadenopathy likely due to infectious pneumonia with recommendations for follow-up CT chest in 2 to 3 months to monitor for resolution).  He did not feel surgical intervention needed as thrombus had resolved, anticoagulation with repeat CT in 3 months to monitor adenopathy as well as hematology consult to rule out hypercoagulable state.  Therapy ongoing and patient limited by right sided hemiparesis with ataxia, left sensory deficits as well as higher level cognitive deficits. Patient transferred to CIR on 09/18/2020 .   Patient currently requires min with mobility secondary to muscle weakness, decreased cardiorespiratoy endurance, ataxia and decreased coordination, and decreased standing balance, decreased postural control, hemiplegia, and decreased balance strategies.  Prior to hospitalization, patient was independent  with mobility and lived with Alone in a House home.  Home access is 4-5Stairs to enter.  Patient will benefit from skilled PT intervention to maximize safe functional mobility, minimize fall risk, and decrease caregiver burden for planned discharge home alone.  Anticipate patient will benefit from follow up OP at discharge.  PT - End of Session Activity Tolerance: Tolerates 10 - 20 min activity with multiple rests Endurance Deficit: Yes Endurance Deficit Description: Pt required rest breaks 2/2 increased ataxia with fatigue PT Plan PT Intensity: Minimum of 1-2 x/day ,45 to 90 minutes PT Frequency: 5 out of 7 days PT Duration Estimated Length of Stay: 7-10 days PT Treatment/Interventions: Ambulation/gait training;Community reintegration;DME/adaptive equipment instruction;Neuromuscular re-education;Psychosocial support;Stair training;UE/LE Strength taining/ROM;Wheelchair  propulsion/positioning;Balance/vestibular training;Discharge planning;Functional electrical stimulation;Pain management;Skin care/wound management;Therapeutic Activities;UE/LE Coordination activities;Cognitive remediation/compensation;Disease management/prevention;Functional mobility training;Patient/family education;Splinting/orthotics;Therapeutic Exercise;Visual/perceptual remediation/compensation  PT Transfers Anticipated Outcome(s): ModI PT Locomotion Anticipated Outcome(s): ModI with LRAD PT Recommendation Follow Up Recommendations: Outpatient PT Patient destination: Home Equipment Recommended: To be determined;Rolling walker with 5" wheels   PT Evaluation Precautions/Restrictions Precautions Precaution Comments: RLE ataxia Restrictions Weight Bearing Restrictions: No General   Vital Signs Pain Pain Assessment Pain Scale: 0-10 Pain Score: 0-No pain Home Living/Prior Functioning Home Living Available Help at Discharge: Family;Available 24 hours/day Type of Home: House Home Access: Stairs to enter [employer] of Steps: 4-5 Entrance Stairs-Rails: None (pt reports uncle will be putting a railing up, undetermined if one to two rails) Home Layout: One level Bathroom Shower/Tub: Multimedia programmer: Standard Bathroom Accessibility: Yes  Lives With: Alone Prior Function Level of Independence: Independent with basic ADLs;Independent with gait;Independent with homemaking with ambulation;Independent with transfers Driving: Yes Vocation: Full time employment Vocation Requirements: Organizing dairy products, carrying heavy objects occasionally. Comments: Drives and works in Scientist, research (medical) at [employer]. Vision/Perception  Vision - Assessment Eye Alignment: Within Functional Limits Ocular Range of Motion: Within Functional Limits Alignment/Gaze Preference: Within Defined Limits Perception Perception: Within Functional Limits Praxis Praxis: Intact  Cognition Overall  Cognitive Status: Impaired/Different from baseline Arousal/Alertness: Awake/alert Orientation Level: Oriented X4 Safety/Judgment: Impaired Comments: Pt unaware of balance deficits with turning until cued. Sensation Sensation Light Touch: Impaired by gross assessment Additional Comments: Pt reporting decreased sensation on Left side of body ("dull" compared to right). Able to locate stimulus with light touch testing Coordination Gross Motor Movements are Fluid and Coordinated: No Fine Motor Movements are Fluid and Coordinated: No Finger Nose Finger Test: moderate dysmetria right Heel Shin Test: Imparied coordination on RLE ataxic movement pattern Motor  Motor Motor: Ataxia;Hemiplegia Motor - Skilled Clinical Observations: RLE   Trunk/Postural Assessment  Cervical Assessment Cervical Assessment: Within Functional Limits Thoracic Assessment Thoracic Assessment: Within Functional Limits Lumbar Assessment Lumbar Assessment: Within Functional Limits Postural Control Postural Control: Deficits on evaluation Righting Reactions: deficits 2/2 weakness/ataxia on right side  Balance Balance Balance Assessed: Yes Standardized Balance Assessment Standardized Balance Assessment: Berg Balance Test;Dynamic Gait Index Berg Balance Test Sit to Stand: Able to stand without using hands and stabilize independently Standing Unsupported: Able to stand safely 2 minutes Sitting with Back Unsupported but Feet Supported on Floor or Stool: Able to sit safely and securely 2 minutes Stand to Sit: Sits safely with minimal use of hands Transfers: Able to transfer safely, minor use of hands Standing Unsupported with Eyes Closed: Able to stand 10 seconds with supervision Standing Ubsupported with Feet Together: Needs help to attain position and unable to hold for 15 seconds From Standing, Reach Forward with Outstretched Arm: Can reach forward >12 cm safely (5") From Standing Position, Pick up Object from  Floor: Able to pick up shoe, needs supervision From Standing Position, Turn to Look Behind Over each Shoulder: Looks behind one side only/other side shows less weight shift Turn 360 Degrees: Needs assistance while turning Standing Unsupported, Alternately Place Feet on Step/Stool: Able to complete >2 steps/needs minimal assist Standing Unsupported, One Foot in Front: Loses balance while stepping or standing Standing on One Leg: Unable to try or needs assist to prevent fall Total Score: 33 Dynamic Gait Index Level Surface: Moderate Impairment Change in Gait Speed: Moderate Impairment Gait with Horizontal Head Turns: Mild Impairment Gait with Vertical Head Turns: Mild Impairment Gait and Pivot Turn: Moderate Impairment Step Over Obstacle: Mild Impairment Step Around Obstacles: Mild Impairment Steps: Mild Impairment Total Score: 13 Static Sitting Balance Static Sitting - Balance  Support: Feet supported Static Sitting - Level of Assistance: 7: Independent Dynamic Sitting Balance Dynamic Sitting - Balance Support: During functional activity Dynamic Sitting - Level of Assistance: 5: Stand by assistance Dynamic Sitting - Balance Activities: Reaching for objects Static Standing Balance Static Standing - Balance Support: During functional activity Static Standing - Level of Assistance: 5: Stand by assistance Dynamic Standing Balance Dynamic Standing - Balance Support: During functional activity Dynamic Standing - Level of Assistance: 4: Min assist Extremity Assessment  RLE Assessment RLE Assessment: Exceptions to [facility] RLE Strength Right Hip Flexion: 4/5 Right Hip ABduction: 4+/5 Right Hip ADduction: 4+/5 Right Knee Flexion: 4/5 Right Knee Extension: 4+/5 Right Ankle Dorsiflexion: 4+/5 Right Ankle Plantar Flexion: 4/5 LLE Assessment General Strength Comments: globally 5/5  Care Tool Care Tool Bed Mobility Roll left and right activity   Roll left and right assist level: Set up  assist    Sit to lying activity   Sit to lying assist level: Supervision/Verbal cueing    Lying to sitting edge of bed activity   Lying to sitting edge of bed assist level: Supervision/Verbal cueing     Care Tool Transfers Sit to stand transfer   Sit to stand assist level: Contact Guard/Touching assist    Chair/bed transfer   Chair/bed transfer assist level: Contact Guard/Touching assist     Toilet transfer   Assist Level: Contact Guard/Touching assist    Car transfer   Car transfer assist level: Minimal Assistance - Patient > 75%      Care Tool Locomotion Ambulation   Assist level: Minimal Assistance - Patient > 75% Assistive device: Walker-rolling Max distance: 135  Walk 10 feet activity   Assist level: Contact Guard/Touching assist Assistive device: Walker-rolling   Walk 50 feet with 2 turns activity   Assist level: Contact Guard/Touching assist Assistive device: Walker-rolling  Walk 150 feet activity   Assist level: Minimal Assistance - Patient > 75% (MinA for steadying with turns) Assistive device: Walker-rolling  Walk 10 feet on uneven surfaces activity   Assist level: Contact Guard/Touching assist Assistive device: Walker-rolling  Stairs   Assist level: Minimal Assistance - Patient > 75% Stairs assistive device: 2 hand rails Max number of stairs: 4  Walk up/down 1 step activity   Walk up/down 1 step (curb) assist level: Contact Guard/Touching assist Walk up/down 1 step or curb assistive device: 2 hand rails    Walk up/down 4 steps activity Walk up/down 4 steps assist level: Minimal Assistance - Patient > 75% Walk up/down 4 steps assistive device: 2 hand rails  Walk up/down 12 steps activity   Walk up/down 12 steps assist level: Minimal Assistance - Patient > 75% Walk up/down 12 steps assistive device: 2 hand rails  Pick up small objects from floor   Pick up small object from the floor assist level: Supervision/Verbal cueing Pick up small object from the  floor assistive device: cup  Wheelchair  Will patient use wheelchair at discharge?: No     Wheelchair assist level: Contact Guard/Touching assist    Wheel 50 feet with 2 turns activity   Assist Level: Contact Guard/Touching assist  Wheel 150 feet activity   Assist Level: Contact Guard/Touching assist    Refer to Care Plan for Long Term Goals  SHORT TERM GOAL WEEK 1 PT Short Term Goal 1 (Week 1): STG = LTG due to ELOS  Recommendations for other services: None   Skilled Therapeutic Intervention Pt received in bed and agreeable to therapy upon entry. Evaluation completed (see  details above) with patient education regarding purpose of PT evaluation, PT POC and goals, therapy schedule, weekly team meetings, and other CIR information including safety plan and fall risk safety.  Berg: 33/56 (A score of <45 indicates the patient is at an increased risk for falls. Education provided to the patient on the interpretation of balance score.)  DGI: 13/24 (A score of <19 indicates a higher risk for falls. Education provided to the patient on the interpretation of balance score)  5xSTS 24 seconds (A score of 15 seconds or greater indicates patient is at an increased risk for falls. Education provided to patient on interpretation of balance score)    Pt ambulated ~185f + ~ 1327fRW CGA up to MinA for maintaining balance, especially with turning. Pt demonstrated RLE ataxia, increased adduction, and increased intensity of heel strike. Pt improved control from 2# weight donned to RLE with decreased ataxia. Verbal cuing for softer heel strike and external cuing to target RLE adduction.  ~1529fith no AD MinA for maintaining balance 2/2 ataxia and RLE adduction.   Pt in bed with bed alarm on and call bell within reach. All needs met at this time.   Mobility Bed Mobility Bed Mobility: Supine to Sit;Sit to Supine Rolling Right: Independent Supine to Sit: Supervision/Verbal cueing Sit to Supine:  Supervision/Verbal cueing Transfers Transfers: Sit to Stand;Stand to Sit;Stand Pivot Transfers Sit to Stand: Supervision/Verbal cueing Stand to Sit: Supervision/Verbal cueing Stand Pivot Transfers: Contact Guard/Touching assist Transfer (Assistive device): Rolling walker Locomotion  Gait Ambulation: Yes Gait Assistance: Contact Guard/Touching assist;Minimal Assistance - Patient > 75% Gait Distance (Feet): 135 Feet Assistive device: Rolling walker Gait Assistance Details: Verbal cues for sequencing;Verbal cues for technique;Verbal cues for precautions/safety;Verbal cues for gait pattern Gait Gait: Yes Gait Pattern: Decreased step length - left;Decreased stance time - left;Decreased hip/knee flexion - right;Decreased weight shift to left;Ataxic;Narrow base of support (increased right adduction) Gait velocity: Decreased Stairs / Additional Locomotion Stairs: Yes Stairs Assistance: Minimal Assistance - Patient > 75% Stair Management Technique: Two rails Number of Stairs: 4 Height of Stairs: 6 Ramp: Contact Guard/touching assist Wheelchair Mobility Wheelchair Mobility: No   Discharge Criteria: Patient will be discharged from PT if patient refuses treatment 3 consecutive times without medical reason, if treatment goals not met, if there is a change in medical status, if patient makes no progress towards goals or if patient is discharged from hospital.  The above assessment, treatment plan, treatment alternatives and goals were discussed and mutually agreed upon: by patient  KaiHenrene PastorPT 09/19/2020, 6:38 PM

## 2020-09-19 NOTE — Discharge Instructions (Addendum)
Inpatient Rehab Discharge Instructions  Malik Jones Discharge date and time: No discharge date for patient encounter.   Activities/Precautions/ Functional Status: Activity: activity as tolerated Diet: diabetic diet Wound Care: Routine skin checks Functional status:  ___ No restrictions     ___ Walk up steps independently ___ 24/7 supervision/assistance   ___ Walk up steps with assistance ___ Intermittent supervision/assistance  ___ Bathe/dress independently ___ Walk with walker     __x_ Bathe/dress with assistance ___ Walk Independently    ___ Shower independently ___ Walk with assistance    ___ Shower with assistance ___ No alcohol     ___ Return to work/school ________  Special Instructions: No driving smoking or alcohol  Follow-up Dr. 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 I will adhere to these goals and the provided educational materials after my discharge from the hospital.  Patient/Caregiver Signature _______________________________ Date __________  Clinician Signature _______________________________________ Date __________  Please bring this form and your medication list with you to all your follow-up doctor's appointments.

## 2020-09-19 NOTE — Progress Notes (Signed)
Nutrition Brief Note  RD consulted for diet education regarding new diagnosis type 2 diabetes mellitus. Pt unavailable during attempted time of contact. RD to plan to revisit for diet education and nutrition assessment.   Corrin Parker, MS, RD, LDN RD pager number/after hours weekend pager number on Amion.

## 2020-09-20 LAB — COMPREHENSIVE METABOLIC PANEL
ALT: 91 U/L — ABNORMAL HIGH (ref 0–44)
AST: 41 U/L (ref 15–41)
Albumin: 4.5 g/dL (ref 3.5–5.0)
Alkaline Phosphatase: 51 U/L (ref 38–126)
Anion gap: 12 (ref 5–15)
BUN: 21 mg/dL — ABNORMAL HIGH (ref 6–20)
CO2: 27 mmol/L (ref 22–32)
Calcium: 10.8 mg/dL — ABNORMAL HIGH (ref 8.9–10.3)
Chloride: 95 mmol/L — ABNORMAL LOW (ref 98–111)
Creatinine, Ser: 1.21 mg/dL (ref 0.61–1.24)
GFR, Estimated: >60 mL/min (ref >60)
Glucose, Bld: 118 mg/dL — ABNORMAL HIGH (ref 70–99)
Potassium: 4.7 mmol/L (ref 3.5–5.1)
Sodium: 134 mmol/L — ABNORMAL LOW (ref 135–145)
Total Bilirubin: 0.6 mg/dL (ref 0.3–1.2)
Total Protein: 8.7 g/dL — ABNORMAL HIGH (ref 6.5–8.1)

## 2020-09-20 LAB — VITAMIN D 25 HYDROXY (VIT D DEFICIENCY, FRACTURES): Vit D, 25-Hydroxy: 22.97 ng/mL — ABNORMAL LOW (ref 30–100)

## 2020-09-20 LAB — GLUCOSE, CAPILLARY
Glucose-Capillary: 124 mg/dL — ABNORMAL HIGH (ref 70–99)
Glucose-Capillary: 102 mg/dL — ABNORMAL HIGH (ref 70–99)
Glucose-Capillary: 134 mg/dL — ABNORMAL HIGH (ref 70–99)
Glucose-Capillary: 134 mg/dL — ABNORMAL HIGH (ref 70–99)

## 2020-09-20 MED ORDER — VITAMIN D (ERGOCALCIFEROL) 1.25 MG (50000 UNIT) PO CAPS
50000.0000 [IU] | ORAL_CAPSULE | ORAL | Status: DC
  Administered 2020-09-20: 50000 [IU] via ORAL
  Filled 2020-09-20: qty 1

## 2020-09-20 MED ORDER — HYDROCHLOROTHIAZIDE 25 MG PO TABS
25.0000 mg | ORAL_TABLET | Freq: Every day | ORAL | Status: DC
  Administered 2020-09-21 – 2020-09-24 (×4): 25 mg via ORAL
  Filled 2020-09-20 (×4): qty 1

## 2020-09-20 NOTE — Progress Notes (Signed)
Occupational Therapy Session Note  Patient Details  Name: Malik Jones MRN: [id number] Date of Birth: [birth year]  Today's Date: 09/20/2020 OT Individual Time: 1430-1515 OT Individual Time Calculation (min): 45 min    Short Term Goals: Week 1:  OT Short Term Goal 1 (Week 1): STG = LTG due to estimated LOS  Skilled Therapeutic Interventions/Progress Updates:    Pt semi upright in bed, requesting to shower.  Pt ambulated to bathroom and completed shower bench transfer with min assist. Pt also attempting to furniture crawl and ambulating quickly with one LOB noted.  Educated pt on need for slowing pace of gait to increase motor control.  Pt bathed and dressed with close supervision sit<>stand at shower bench using grab bars.  Pt complaining of sharp pains in left and right abdomen throughout and stating he doesn't remember the last time he had a bowel movement, but does not have to go at the moment.  Notified nurse of pts pain location and level and reports of no bowel. Pt ambulated back to bed with min assist without AD needing mod Vcs to slow gait speed but with better follow through this time.Stand to sit, and sit to supine with supervision. Call bell in reach, bed alarm on, direct hand off to nurse. Pt may benefit from use of AD during ambulation due to instability, incoordination.  Therapy Documentation Precautions:  Precautions Precautions: Fall Precaution Comments: RLE ataxia Restrictions Weight Bearing Restrictions: No    Therapy/Group: Individual Therapy  Ezekiel Slocumb 09/20/2020, 3:59 PM

## 2020-09-20 NOTE — Progress Notes (Addendum)
PROGRESS NOTE   Subjective/Complaints: No complaints this morning Has been working hard with therapy!  ROS: Denies pain, insomnia   Objective:   No results found. Recent Labs    09/18/20 0330 09/19/20 0543  WBC 4.5 5.1  HGB 15.0 15.9  HCT 44.7 47.4  PLT 355 366   Recent Labs    09/19/20 0543 09/20/20 0621  NA 134* 134*  K 4.6 4.7  CL 99 95*  CO2 24 27  GLUCOSE 122* 118*  BUN 21* 21*  CREATININE 1.18 1.21  CALCIUM 10.3 10.8*    Intake/Output Summary (Last 24 hours) at 09/20/2020 1523 Last data filed at 09/20/2020 0930 Gross per 24 hour  Intake 240 ml  Output 1375 ml  Net -1135 ml        Physical Exam: Vital Signs Blood pressure (!) 144/94, pulse 89, temperature 98.4 F (36.9 C), temperature source Oral, resp. rate 17, height '5\' 6"'$  (1.676 m), weight 64.4 kg, SpO2 97 %. Gen: no distress, normal appearing HEENT: oral mucosa pink and moist, NCAT Cardio: Reg rate Chest: normal effort, normal rate of breathing Abd: soft, non-distended Ext: no edema Psych: pleasant, normal affect Skin: intact Neurological:     General: No focal deficit present.     Mental Status: He is alert and oriented to person, place, and time.     Comments: Mild dysarthira Alert Horizontal nystagmus without dizziness or diplopia. Ataxia with decreased coordination RUE>RLE. Sensory deficits LUE/LLE --improving.  RUE: 4+/5 proximal to distal with atxtia LUE/LLE: 5/5 proximal to distal  Psychiatric:        Mood and Affect: Mood normal.        Behavior: Behavior normal.    Assessment/Plan: 1. Functional deficits which require 3+ hours per day of interdisciplinary therapy in a comprehensive inpatient rehab setting. Physiatrist is providing close team supervision and 24 hour management of active medical problems listed below. Physiatrist and rehab team continue to assess barriers to discharge/monitor patient progress toward  functional and medical goals  Care Tool:  Bathing    Body parts bathed by patient: Right arm, Left arm, Chest, Abdomen, Front perineal area, Buttocks, Right upper leg, Left upper leg, Right lower leg, Left lower leg, Face         Bathing assist Assist Level: Contact Guard/Touching assist     Upper Body Dressing/Undressing Upper body dressing   What is the patient wearing?: Pull over shirt    Upper body assist Assist Level: Supervision/Verbal cueing    Lower Body Dressing/Undressing Lower body dressing      What is the patient wearing?: Pants     Lower body assist Assist for lower body dressing: Contact Guard/Touching assist     Toileting Toileting    Toileting assist Assist for toileting: Independent with assistive device Assistive Device Comment: urinal   Transfers Chair/bed transfer  Transfers assist     Chair/bed transfer assist level: Contact Guard/Touching assist     Locomotion Ambulation   Ambulation assist      Assist level: Minimal Assistance - Patient > 75% Assistive device: Walker-rolling Max distance: 135   Walk 10 feet activity   Assist     Assist level:  Contact Guard/Touching assist Assistive device: Walker-rolling   Walk 50 feet activity   Assist    Assist level: Contact Guard/Touching assist Assistive device: Walker-rolling    Walk 150 feet activity   Assist    Assist level: Minimal Assistance - Patient > 75% (MinA for steadying with turns) Assistive device: Walker-rolling    Walk 10 feet on uneven surface  activity   Assist     Assist level: Contact Guard/Touching assist Assistive device: Aeronautical engineer Will patient use wheelchair at discharge?: No      Wheelchair assist level: Contact Guard/Touching assist      Wheelchair 50 feet with 2 turns activity    Assist        Assist Level: Contact Guard/Touching assist   Wheelchair 150 feet activity     Assist       Assist Level: Contact Guard/Touching assist   Blood pressure (!) 144/94, pulse 89, temperature 98.4 F (36.9 C), temperature source Oral, resp. rate 17, height '5\' 6"'$  (1.676 m), weight 64.4 kg, SpO2 97 %.  Medical Problem List and Plan: 1.  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             -patient may not shower             -ELOS/Goals: 7-10 days/Mod I 2.  Antithrombotics: -DVT/anticoagulation:  Pharmaceutical: Other (comment)--Eliquis             -antiplatelet therapy: N/A 3. Pain Management: Tylenol prn             Monitor with increased ecterntion. 4. Mood: LCSW to follow for evaluation and support.              -antipsychotic agents: N/a 5. Neuropsych: This patient is capable of making decisions on his own behalf. 6. Skin/Wound Care: Routine pressure relief measures.  7. Fluids/Electrolytes/Nutrition: Monitor I/O.                CMP ordered for tomorrow AM.  8.  HTN: Monitor blood pressures 3 times daily.   --Continue amlodipine and HCTZ, increase HCTZ to '25mg'$ .   -Magnesium and potassium levels reviewed and stable.             Monitor with increased mobility.  9.  New onset T2DM: Hemoglobin A1c 6.5.  RD consult to educate patient on CM diet.  --will start monitoring BS ac/hs and use SSI for elevated BS --d/c Glucerna             Monitor with increased activity 10. Transaminitis: discontinue Tylenol and repeat tomorrow.  11. Vitamin D deficiency: start ergocalciferol 50,000U once per week for 7 weeks 13. Hyponatremia: Na 134- repeat outpatient    LOS: 2 days A FACE TO FACE EVALUATION WAS PERFORMED  Clide Deutscher Wandra Babin 09/20/2020, 3:23 PM

## 2020-09-20 NOTE — Progress Notes (Signed)
Speech Language Pathology Daily Session Note  Patient Details  Name: Malik Jones MRN: [id number] Date of Birth: 1960-06-25  Today's Date: 09/20/2020 SLP Individual Time: 1100-1200 SLP Individual Time Calculation (min): 60 min  Short Term Goals: Week 1: SLP Short Term Goal 1 (Week 1): Patient will achieve 90% intelligiblity during unstructured conversation and oral reading at paragraph level with minA cues. SLP Short Term Goal 2 (Week 1): Patient will participate in testing of higher level cognitive function. SLP Short Term Goal 3 (Week 1): Patient will utilize learned strategies for memory and be able to to recall and describe recent therapeutic (PT, OT, ST) and medical interventions/learned strategies/learned exercises with supervision A.  Skilled Therapeutic Interventions:   Patient seen for skilled ST session focusing on cognitive function goals. He participated in completing portions of ALFA test, scoring 80% accuracy for Solving Daily Math problems and Reading Directions. He was then able to determine correct answer when SLP cued him to slow down and take his time. He demonstrates awareness to need to slow down when speaking and completing cognitive tasks, however is not able to self-correct effectively without cues. Patient continues to benefit from skilled SLP intervention to maximize cognitive and speech function prior to discharge.  Pain Pain Assessment Pain Scale: 0-10 Pain Score: 0-No pain  Therapy/Group: Individual Therapy  Sonia Baller, MA, CCC-SLP Speech Therapy

## 2020-09-20 NOTE — Plan of Care (Signed)
  Problem: Consults Goal: RH STROKE PATIENT EDUCATION Description: See Patient Education module for education specifics  Outcome: Progressing   Problem: RH BOWEL ELIMINATION Goal: RH STG MANAGE BOWEL WITH ASSISTANCE Description: STG Manage Bowel with mod I Assistance. Outcome: Progressing Goal: RH STG MANAGE BOWEL W/MEDICATION W/ASSISTANCE Description: STG Manage Bowel with Medication with mod I Assistance. Outcome: Progressing   Problem: RH SAFETY Goal: RH STG ADHERE TO SAFETY PRECAUTIONS W/ASSISTANCE/DEVICE Description: STG Adhere to Safety Precautions With cues/reminders Assistance/Device. Outcome: Progressing   Problem: RH KNOWLEDGE DEFICIT Goal: RH STG INCREASE KNOWLEDGE OF DIABETES Description: Patient will be able to manage DM with medications and dietary modifications using handouts and educational tools independently Outcome: Progressing Goal: RH STG INCREASE KNOWLEDGE OF HYPERTENSION Description: Patient will be able to manage HTN with medications and dietary modifications using handouts and educational tools independently Outcome: Progressing Goal: RH STG INCREASE KNOWLEGDE OF HYPERLIPIDEMIA Description: Patient will be able to manage HLD with medications and dietary modifications using handouts and educational tools independently Outcome: Progressing Goal: RH STG INCREASE KNOWLEDGE OF STROKE PROPHYLAXIS Description: Patient will be able to manage secondary stroke risks with medications and dietary modifications using handouts and educational tools independently Outcome: Progressing

## 2020-09-20 NOTE — Progress Notes (Signed)
Physical Therapy Session Note  Patient Details  Name: Malik Jones MRN: [id number] Date of Birth: [birth year]  Today's Date: 09/20/2020 PT Individual Time: 1300-1414 PT Individual Time Calculation (min): 74 min   Short Term Goals: Week 1:  PT Short Term Goal 1 (Week 1): STG = LTG due to ELOS  Skilled Therapeutic Interventions/Progress Updates:     Pt supine in bed to start session - agreeable to therapy. Pleasant and cooperative during session, motivated to return home. Denies any pain. Supine<>sit completed mod I. Donned tennis shoes with setupA only while seated EOB - able to tie his shoes with noted difficulty using RUE 2/2 dyscoordination.   Sit<>stand with CGA and no AD and ambulates from his room to ortho rehab gym, ~254f, with minA and no AD (did require modA at times due to LOB from RLE ataxia with scissoring episodes). Gait demonstrates intense R heel strike, RLE ataxia, and increased lateral sway.   Worked on RUE GMC/FMC with BITS system with trail making task and visual targets task. Trail making completed at 100% accuracy and visual target completed with ~37% accuracy. Did the same visual target activity with a 1.5# ankle weight to RUE distal wrist and accuracy improved to 50%.   Next, worked on dynamic balance with alternating toe taps to 8 inch platform with minA for balance. Progressed to alternating step-ups in sagital plan with min/modA for balance and then lateral step ups with modA for balance - all completed without UE support. Next, positioned in quadruped on mat table with CGA. Worked on modified bird dog with cone taps and cone tacking in quadruped to facilitate RUE weightbearing. Then worked on tall kneeling hip thrusts and hip thursts/extension + overhead press with unweighted ball.   Ambulatory transfer with minA to Nustep - completed 8 minutes at workload 6, using BLE's only, working on reciprocal movement patterns and coordination training - pt requiring a few  intermittent rest breaks (x5 total) during 8 min span.   Ambulated back to his room with minA overall and only x1 episode of scissoring requiring modA for correction. Pt requesting to remain seated EOB at end of session - reminded of "call don't fall" policy and pt voiced understanding. Bed alarm on and all needs in reach at end of session.  Therapy Documentation Precautions:  Precautions Precautions: Fall Precaution Comments: RLE ataxia Restrictions Weight Bearing Restrictions: No General:    Therapy/Group: Individual Therapy  [name]8/01/2021, 7:36 AM

## 2020-09-20 NOTE — IPOC Note (Signed)
Overall Plan of Care [facility]) Patient Details Name: Malik Jones MRN: [id number] DOB: 06-Aug-1960  Admitting Diagnosis: Cerebellar stroke [facility])  Hospital Problems: Principal Problem:   Cerebellar stroke [facility])     Functional Problem List: Nursing Bowel, Medication Management, Safety, Endurance  PT Balance, Behavior, Endurance, Motor, Safety, Perception  OT Balance, Motor  SLP Cognition  TR         Basic ADL's: OT Grooming, Bathing, Dressing, Toileting     Advanced  ADL's: OT Simple Meal Preparation     Transfers: PT Bed to Chair, Car, Furniture, [facility] Mobility, Floor  OT Toilet, Tub/Shower     Locomotion: PT Ambulation, Emergency planning/management officer, Stairs     Additional Impairments: OT Fuctional Use of Upper Extremity  SLP None      TR      Anticipated Outcomes Item Anticipated Outcome  Self Feeding independent  Swallowing  N/A   Basic self-care  set up  Toileting  supervision   Bathroom Transfers supervision  Bowel/Bladder  manage bowel w mod I assist  Transfers  ModI  Locomotion  ModI with LRAD  Communication  mod I  Cognition  mod I  Pain  n/a  Safety/Judgment  maintain safety with cues/reminders   Therapy Plan: PT Intensity: Minimum of 1-2 x/day ,45 to 90 minutes PT Frequency: 5 out of 7 days PT Duration Estimated Length of Stay: 7-10 days OT Intensity: Minimum of 1-2 x/day, 45 to 90 minutes OT Frequency: 5 out of 7 days OT Duration/Estimated Length of Stay: 1 week SLP Intensity: Minumum of 1-2 x/day, 30 to 90 minutes SLP Frequency: 3 to 5 out of 7 days SLP Duration/Estimated Length of Stay: 7-10 days   Due to the current state of emergency, patients may not be receiving their 3-hours of Medicare-mandated therapy.   Team Interventions: Nursing Interventions Disease Management/Prevention, Medication Management, Discharge Planning, Bowel Management, Patient/Family Education  PT interventions Ambulation/gait training, Community reintegration,  DME/adaptive equipment instruction, Neuromuscular re-education, Psychosocial support, Stair training, UE/LE Strength taining/ROM, Wheelchair propulsion/positioning, Training and development officer, Discharge planning, Functional electrical stimulation, Pain management, Skin care/wound management, Therapeutic Activities, UE/LE Coordination activities, Cognitive remediation/compensation, Disease management/prevention, Functional mobility training, Patient/family education, Splinting/orthotics, Therapeutic Exercise, Visual/perceptual remediation/compensation  OT Interventions Balance/vestibular training, Neuromuscular re-education, Self Care/advanced ADL retraining, DME/adaptive equipment instruction, Patient/family education, UE/LE Coordination activities, Discharge planning, Functional mobility training, Therapeutic Activities  SLP Interventions Cognitive remediation/compensation, Patient/family education, Functional tasks  TR Interventions    SW/CM Interventions Discharge Planning, Psychosocial Support, Patient/Family Education   Barriers to Discharge MD  Medical stability  Nursing Decreased caregiver support, Home environment access/layout, New diabetic, Medication compliance 1 level 4 ste no rails, son will assist as needed  PT Insurance for SNF coverage, Home environment access/layout, Lack of/limited family support    OT      SLP      SW Lack of/limited family support, Medication compliance     Team Discharge Planning: Destination: PT-Home ,OT- Home , SLP-Home Projected Follow-up: PT-Outpatient PT, OT-  Outpatient OT, SLP-None Projected Equipment Needs: PT-To be determined, Rolling walker with 5" wheels, OT- 3 in 1 bedside comode, Tub/shower bench, SLP-None recommended by SLP Equipment Details: PT- , OT-  Patient/family involved in discharge planning: PT- Patient,  OT-Patient, SLP-Patient  MD ELOS: 7-10 days Medical Rehab Prognosis:  Excellent Assessment: Malik Jones is a 60 year old man  who is admitted to CIR with acute/subacute large right superior cerebellar artery territory, 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 Medications are being  managed, and labs and vitals are being monitored regularly.     See Team Conference Notes for weekly updates to the plan of care

## 2020-09-21 DIAGNOSIS — R7401 Elevation of levels of liver transaminase levels: Secondary | ICD-10-CM

## 2020-09-21 DIAGNOSIS — I10 Essential (primary) hypertension: Secondary | ICD-10-CM

## 2020-09-21 DIAGNOSIS — E119 Type 2 diabetes mellitus without complications: Secondary | ICD-10-CM

## 2020-09-21 LAB — GLUCOSE, CAPILLARY
Glucose-Capillary: 127 mg/dL — ABNORMAL HIGH (ref 70–99)
Glucose-Capillary: 119 mg/dL — ABNORMAL HIGH (ref 70–99)
Glucose-Capillary: 144 mg/dL — ABNORMAL HIGH (ref 70–99)
Glucose-Capillary: 127 mg/dL — ABNORMAL HIGH (ref 70–99)

## 2020-09-21 NOTE — Progress Notes (Signed)
Occupational Therapy Session Note  Patient Details  Name: Malik Jones MRN: [id number] Date of Birth: 1960/09/18  Today's Date: 09/21/2020 OT Individual Time: 1320-1355 OT Individual Time Calculation (min): 35 min    Short Term Goals: Week 1:  OT Short Term Goal 1 (Week 1): STG = LTG due to estimated LOS  Skilled Therapeutic Interventions/Progress Updates: Patient complained of extreme fatigue.   HOwever, he partiicpated in education especially targeted to address his right upper extremity ataxia and to increase motor control.  Also during session, he complained of over fatigue today and lack of sleep due to being upset at what he thought he overheared ;last night beginning around 11:30 from nursing staff at desk through the wall monitor that had not been turned off.  He reported that he heard a male staff member call him or another patient a negative name and use speak negative works rt.  This was his perception but not witnessed by this clinician.  This cliniican asked if he needed to speak with nursing charge nurse or supervisior.  He stated that would help so that the staff member can educated on utilizing, "good professional speak and customer services empathy because no patient wants to be in here."  Patient left as he request resting on his bed with call bell and phone within reach.  Continue OT plan of care     Therapy Documentation Precautions:  Precautions Precautions: Fall Precaution Comments: RLE ataxia Restrictions Weight Bearing Restrictions: No    Vital Signs:  Pain: Pain Assessment Pain Scale: 0-10 Pain Score: 0-No pain ADL:    Therapy/Group: Individual Therapy  Alfredia Ferguson [facility] 09/21/2020, 4:55 PM

## 2020-09-21 NOTE — Progress Notes (Signed)
Speech Language Pathology Daily Session Note  Patient Details  Name: Malik Jones MRN: [id number] Date of Birth: [birth year]  Today's Date: 09/21/2020 SLP Individual Time: 1116-1200 SLP Individual Time Calculation (min): 44 min  Short Term Goals: Week 1: SLP Short Term Goal 1 (Week 1): Patient will achieve 90% intelligiblity during unstructured conversation and oral reading at paragraph level with minA cues. SLP Short Term Goal 2 (Week 1): Patient will participate in testing of higher level cognitive function. SLP Short Term Goal 3 (Week 1): Patient will utilize learned strategies for memory and be able to to recall and describe recent therapeutic (PT, OT, ST) and medical interventions/learned strategies/learned exercises with supervision A.  Skilled Therapeutic Interventions: Pt seen for skilled ST with focus on cognitive goals, pt disappointed his employer posted an opening for his job (worked at [employer] 19.5 years). SLP facilitating money management task by providing supervision level cues for 100% accuracy. Pt able to detail bill paying routine at home independently (combo of debit card over phone, autodraft and paying in person). Pt unable to recall name/usage of current medications (didn't take anything at home), utilize written aid to increase recall and understanding of medication regime. Pt left in bed with alarm set and all needs within reach. Cont ST POC.  Pain Pain Assessment Pain Scale: 0-10 Pain Score: 0-No pain  Therapy/Group: Individual Therapy  Dewaine Conger 09/21/2020, 11:54 AM

## 2020-09-21 NOTE — Plan of Care (Signed)
  Problem: Consults Goal: RH STROKE PATIENT EDUCATION Description: See Patient Education module for education specifics  Outcome: Progressing   Problem: RH BOWEL ELIMINATION Goal: RH STG MANAGE BOWEL WITH ASSISTANCE Description: STG Manage Bowel with mod I Assistance. Outcome: Progressing Goal: RH STG MANAGE BOWEL W/MEDICATION W/ASSISTANCE Description: STG Manage Bowel with Medication with mod I Assistance. Outcome: Progressing   Problem: RH SAFETY Goal: RH STG ADHERE TO SAFETY PRECAUTIONS W/ASSISTANCE/DEVICE Description: STG Adhere to Safety Precautions With cues/reminders Assistance/Device. Outcome: Progressing   Problem: RH KNOWLEDGE DEFICIT Goal: RH STG INCREASE KNOWLEDGE OF DIABETES Description: Patient will be able to manage DM with medications and dietary modifications using handouts and educational tools independently Outcome: Progressing Goal: RH STG INCREASE KNOWLEDGE OF HYPERTENSION Description: Patient will be able to manage HTN with medications and dietary modifications using handouts and educational tools independently Outcome: Progressing Goal: RH STG INCREASE KNOWLEGDE OF HYPERLIPIDEMIA Description: Patient will be able to manage HLD with medications and dietary modifications using handouts and educational tools independently Outcome: Progressing Goal: RH STG INCREASE KNOWLEDGE OF STROKE PROPHYLAXIS Description: Patient will be able to manage secondary stroke risks with medications and dietary modifications using handouts and educational tools independently Outcome: Progressing

## 2020-09-21 NOTE — Progress Notes (Signed)
Occupational Therapy Session Note  Patient Details  Name: Malik Jones MRN: [id number] Date of Birth: March 20, 1960  Today's Date: 09/21/2020 OT Individual Time: 0940-1040 OT Individual Time Calculation (min): 60 min    Short Term Goals: Week 1:  OT Short Term Goal 1 (Week 1): STG = LTG due to estimated LOS  Skilled Therapeutic Interventions/Progress Updates:   Upon approach for OT, Malik Jones stated that he was very fatigued from earlier PT session and that he got very little sleep the night before due to worrying about negative comments he stated she overheard from the nursing staff regarding himself.   Malik Jones participated in skilled OT this session to address goals, including ADLsand IADLs at highest functional status given deficits.  He participated in and tolerated therapy as follows:  Dressing skills:  overall S to CGA sit to stand with ataxic motor planning deficits in right upper extremity when donning shirt over head.   LB dressing close S sit to stand at edge of bed without an assistive device.  With CGA to min A via gait belt to go from his room and day room without utilizing an asssitive walking device, his right foot tended to bump into items on the right due to his ataxic left lower extremity.  Otherwise, to address right upper and lower extremity ataxia to increase independence with self care, leisure pursuits, and functional mobility for self care and home making skills, he participated in quadraped and right upper extremity neuro reducation to increase function.   He tended required minimal to moderate assistance to weight bear through entire trunk and into upper extremities in quadraped positions.       With more opportunities to complete trunk, UE, LE neural processing, feedback, proprioception and other neuro reducation opportunities his fucntion is expected to increase fairly quickly given his progress.  Once back in his room, patient rested on his bed with  call bell in place.  Continue his OT plan of care      Therapy Documentation Precautions:  Precautions Precautions: Fall Precaution Comments: RLE ataxia Restrictions Weight Bearing Restrictions: No  Pain: Pain Assessment Pain Scale: 0-10 Pain Score: 0-No pain     Therapy/Group: Individual Therapy  Alfredia Ferguson [facility] 09/21/2020, 4:03 PM

## 2020-09-21 NOTE — Progress Notes (Signed)
Physical Therapy Session Note  Patient Details  Name: Malik Jones MRN: [id number] Date of Birth: 07-02-1960  Today's Date: 09/21/2020 PT Individual Time: 0807-0901 PT Individual Time Calculation (min): 54 min   Short Term Goals: Week 1:  PT Short Term Goal 1 (Week 1): STG = LTG due to ELOS  Skilled Therapeutic Interventions/Progress Updates:    Pt received supine in bed and agreeable to therapy session. Pt reports he is upset due to what he overheard the nursing staff saying last night - therapist notified charge nurse to ensure pt was assigned to different nurses tonight. Supine>sitting R EOB, HOB flat and not using bedrail, mod-I. Sitting EOB, with distant supervision for balance, donning shoes with increased time for R UE coordination when tying laces. Sit<>stands, no AD, CGA for steadying/safety during session. Gait training ~330f, no AD, with CGA/light min assist and intermittent mod assist when turning due to R anterior LOB - R LE ataxia results in scissoring causing LOB - pt very responsive to verbal cuing to increased BOS.  Dynamic gait training using agility latter:  - forward reciprocal stepping pattern - lateral side stepping - cuing for slower more controled movements Pt continuing to require intermittent mod assist due to R anterior LOB otherwise requires CGA/min assist. After seated rest break repeated this task with pt demonstrating improved R LE coordination, improved balance (cuing for full L weight shift anytime stepping with R LE), and cuing for increased R hip/knee flexion when stepping laterally as opposed to keeping knee extended), and improving balance recovery strategies.   Dynamic standing balance via 3 cone taps progressed to 5 cone taps working on R/L weight shifting and R LE coordination with intermittent min/mod assist for balance due to R LOB - working on hip balance recovery strategy.  Dynamic standing balance via 4 square stepping over hockey sticks - requires  more consistent and heavier mod assist for balance with this task due to repeated R anterior LOB especially when stepping backwards. Cuing to slow down and recover balance between each step. Continued to allow pt time to problem solve how to perform balance recovery strategy without therapist facilitating movement back to midline. Pt would benefit from repeating this task in the future.  Gait training ~2073fback to room, no AD, with CGA/min assist for balance and pt demonstrating improved R LE coordination with no significant scissoring. Sit>supine mod-I. Pt left supine in bed with needs in reach and bed alarm on.   Therapy Documentation Precautions:  Precautions Precautions: Fall Precaution Comments: RLE ataxia Restrictions Weight Bearing Restrictions: No   Pain:  Denies pain during session.   Therapy/Group: Individual Therapy  CaTawana Scale PT, DPT, NCS, CSRS 09/21/2020, 7:49 AM

## 2020-09-21 NOTE — Progress Notes (Signed)
PROGRESS NOTE   Subjective/Complaints: Patient seen laying in bed this morning.  He states he slept fairly overnight because his safety alarm kept going off.  ROS: Denies CP, SOB, N/V/D  Objective:   No results found. Recent Labs    09/19/20 0543  WBC 5.1  HGB 15.9  HCT 47.4  PLT 366    Recent Labs    09/19/20 0543 09/20/20 0621  NA 134* 134*  K 4.6 4.7  CL 99 95*  CO2 24 27  GLUCOSE 122* 118*  BUN 21* 21*  CREATININE 1.18 1.21  CALCIUM 10.3 10.8*     Intake/Output Summary (Last 24 hours) at 09/21/2020 1426 Last data filed at 09/21/2020 1300 Gross per 24 hour  Intake 733 ml  Output 1575 ml  Net -842 ml         Physical Exam: Vital Signs Blood pressure (!) 143/90, pulse 90, temperature 98.8 F (37.1 C), temperature source Oral, resp. rate 18, height '5\' 6"'$  (1.676 m), weight 64.4 kg, SpO2 98 %. Constitutional: No distress . Vital signs reviewed. HENT: Normocephalic.  Atraumatic. Eyes: EOMI. No discharge. Cardiovascular: No JVD.  RRR. Respiratory: Normal effort.  No stridor.  Bilateral clear to auscultation. GI: Non-distended.  BS +. Skin: Warm and dry.  Intact. Psych: Normal mood.  Normal behavior. Musc: No edema in extremities.  No tenderness in extremities. Neuro: Alert Mild dysarthria RUE: 4+/5 proximal to distal with atxtia, stable LUE/LLE: 5/5 proximal to distal   Assessment/Plan: 1. Functional deficits which require 3+ hours per day of interdisciplinary therapy in a comprehensive inpatient rehab setting. Physiatrist is providing close team supervision and 24 hour management of active medical problems listed below. Physiatrist and rehab team continue to assess barriers to discharge/monitor patient progress toward functional and medical goals  Care Tool:  Bathing    Body parts bathed by patient: Right arm, Left arm, Chest, Abdomen, Front perineal area, Buttocks, Right upper leg, Left upper  leg, Right lower leg, Left lower leg, Face         Bathing assist Assist Level: Contact Guard/Touching assist     Upper Body Dressing/Undressing Upper body dressing   What is the patient wearing?: Pull over shirt    Upper body assist Assist Level: Supervision/Verbal cueing    Lower Body Dressing/Undressing Lower body dressing      What is the patient wearing?: Pants     Lower body assist Assist for lower body dressing: Contact Guard/Touching assist     Toileting Toileting    Toileting assist Assist for toileting: Independent with assistive device Assistive Device Comment: urinal   Transfers Chair/bed transfer  Transfers assist     Chair/bed transfer assist level: Contact Guard/Touching assist     Locomotion Ambulation   Ambulation assist      Assist level: Moderate Assistance - Patient 50 - 74% Assistive device: No Device Max distance: 351f   Walk 10 feet activity   Assist     Assist level: Minimal Assistance - Patient > 75% Assistive device: No Device   Walk 50 feet activity   Assist    Assist level: Moderate Assistance - Patient - 50 - 74% Assistive device: No Device  Walk 150 feet activity   Assist    Assist level: Moderate Assistance - Patient - 50 - 74% Assistive device: No Device    Walk 10 feet on uneven surface  activity   Assist     Assist level: Contact Guard/Touching assist Assistive device: Walker-rolling   Wheelchair     Assist Will patient use wheelchair at discharge?: No      Wheelchair assist level: Contact Guard/Touching assist      Wheelchair 50 feet with 2 turns activity    Assist        Assist Level: Contact Guard/Touching assist   Wheelchair 150 feet activity     Assist      Assist Level: Contact Guard/Touching assist   Blood pressure (!) 143/90, pulse 90, temperature 98.8 F (37.1 C), temperature source Oral, resp. rate 18, height '5\' 6"'$  (1.676 m), weight 64.4 kg, SpO2  98 %.  Medical Problem List and Plan: 1.  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  Continue CIR 2.  Antithrombotics: -DVT/anticoagulation:  Pharmaceutical: Other (comment)--Eliquis             -antiplatelet therapy: N/A 3. Pain Management: Tylenol prn             Monitor with increased ecterntion. 4. Mood: LCSW to follow for evaluation and support.              -antipsychotic agents: N/a 5. Neuropsych: This patient is capable of making decisions on his own behalf. 6. Skin/Wound Care: Routine pressure relief measures.  7. Fluids/Electrolytes/Nutrition: Monitor I/Os.    8.  HTN: Monitor blood pressures 3 times daily.   --Continue amlodipine and HCTZ, increase HCTZ to '25mg'$ .   -Magnesium and potassium levels reviewed and stable.             Monitor with increased mobility.   Controlled on 8/13 9.  New onset T2DM: Hemoglobin A1c 6.5.  RD consult to educate patient on CM diet.  --will start monitoring BS ac/hs and use SSI for elevated BS --d/c Glucerna Monitor with increased activity Mild elevated on 8/13 10. Transaminitis: discontinue Tylenol and repeat tomorrow.   ALT elevated on 8/12 11. Vitamin D deficiency: start ergocalciferol 50,000U once per week for 7 weeks 13. Hyponatremia: Na 134- repeat outpatient    LOS: 3 days A FACE TO FACE EVALUATION WAS PERFORMED  Benny Deutschman Lorie Phenix 09/21/2020, 2:26 PM

## 2020-09-22 DIAGNOSIS — E871 Hypo-osmolality and hyponatremia: Secondary | ICD-10-CM

## 2020-09-22 LAB — GLUCOSE, CAPILLARY
Glucose-Capillary: 134 mg/dL — ABNORMAL HIGH (ref 70–99)
Glucose-Capillary: 109 mg/dL — ABNORMAL HIGH (ref 70–99)
Glucose-Capillary: 119 mg/dL — ABNORMAL HIGH (ref 70–99)
Glucose-Capillary: 147 mg/dL — ABNORMAL HIGH (ref 70–99)

## 2020-09-22 NOTE — Progress Notes (Signed)
PROGRESS NOTE   Subjective/Complaints: Patient seen laying in bed this morning.  He states he slept much better last night.  He states he is doing well.  He denies complaints.  ROS: Denies CP, SOB, N/V/D  Objective:   No results found. No results for input(s): WBC, HGB, HCT, PLT in the last 72 hours.  Recent Labs    09/20/20 0621  NA 134*  K 4.7  CL 95*  CO2 27  GLUCOSE 118*  BUN 21*  CREATININE 1.21  CALCIUM 10.8*     Intake/Output Summary (Last 24 hours) at 09/22/2020 1950 Last data filed at 09/22/2020 1700 Gross per 24 hour  Intake 696 ml  Output 2200 ml  Net -1504 ml         Physical Exam: Vital Signs Blood pressure (!) 130/99, pulse 93, temperature 98.3 F (36.8 C), temperature source Oral, resp. rate 17, height '5\' 6"'$  (1.676 m), weight 64.4 kg, SpO2 99 %. Constitutional: No distress . Vital signs reviewed. HENT: Normocephalic.  Atraumatic. Eyes: EOMI. No discharge. Cardiovascular: No JVD.  RRR. Respiratory: Normal effort.  No stridor.  Bilateral clear to auscultation. GI: Non-distended.  BS +. Skin: Warm and dry.  Intact. Psych: Normal mood.  Normal behavior. Musc: No edema in extremities.  No tenderness in extremities. Neuro: Alert Mild dysarthria, improving RUE: 4+/5 proximal to distal with atxtia, stable LUE/LLE: 5/5 proximal to distal   Assessment/Plan: 1. Functional deficits which require 3+ hours per day of interdisciplinary therapy in a comprehensive inpatient rehab setting. Physiatrist is providing close team supervision and 24 hour management of active medical problems listed below. Physiatrist and rehab team continue to assess barriers to discharge/monitor patient progress toward functional and medical goals  Care Tool:  Bathing    Body parts bathed by patient: Right arm, Left arm, Chest, Abdomen, Front perineal area, Buttocks, Right upper leg, Left upper leg, Right lower leg, Left  lower leg, Face         Bathing assist Assist Level: Contact Guard/Touching assist     Upper Body Dressing/Undressing Upper body dressing   What is the patient wearing?: Pull over shirt    Upper body assist Assist Level: Supervision/Verbal cueing    Lower Body Dressing/Undressing Lower body dressing      What is the patient wearing?: Pants     Lower body assist Assist for lower body dressing: Contact Guard/Touching assist     Toileting Toileting    Toileting assist Assist for toileting: Independent with assistive device Assistive Device Comment: urinal   Transfers Chair/bed transfer  Transfers assist     Chair/bed transfer assist level: Contact Guard/Touching assist     Locomotion Ambulation   Ambulation assist      Assist level: Moderate Assistance - Patient 50 - 74% Assistive device: No Device Max distance: 322f   Walk 10 feet activity   Assist     Assist level: Minimal Assistance - Patient > 75% Assistive device: No Device   Walk 50 feet activity   Assist    Assist level: Moderate Assistance - Patient - 50 - 74% Assistive device: No Device    Walk 150 feet activity   Assist  Assist level: Moderate Assistance - Patient - 50 - 74% Assistive device: No Device    Walk 10 feet on uneven surface  activity   Assist     Assist level: Contact Guard/Touching assist Assistive device: Walker-rolling   Wheelchair     Assist Will patient use wheelchair at discharge?: No      Wheelchair assist level: Contact Guard/Touching assist      Wheelchair 50 feet with 2 turns activity    Assist        Assist Level: Contact Guard/Touching assist   Wheelchair 150 feet activity     Assist      Assist Level: Contact Guard/Touching assist   Blood pressure (!) 130/99, pulse 93, temperature 98.3 F (36.8 C), temperature source Oral, resp. rate 17, height '5\' 6"'$  (1.676 m), weight 64.4 kg, SpO2 99 %.  Medical Problem  List and Plan: 1.  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  Continue CIR 2.  Antithrombotics: -DVT/anticoagulation:  Pharmaceutical: Other (comment)--Eliquis             -antiplatelet therapy: N/A 3. Pain Management: Tylenol prn             Monitor with increased ecterntion. 4. Mood: LCSW to follow for evaluation and support.              -antipsychotic agents: N/a 5. Neuropsych: This patient is capable of making decisions on his own behalf. 6. Skin/Wound Care: Routine pressure relief measures.  7. Fluids/Electrolytes/Nutrition: Monitor I/Os.    8.  HTN: Monitor blood pressures 3 times daily.   --Continue amlodipine and HCTZ, increase HCTZ to '25mg'$ .   -Magnesium and potassium levels reviewed and stable.             Monitor with increased mobility.   Controlled on 8/14 9.  New onset T2DM: Hemoglobin A1c 6.5.  RD consult to educate patient on CM diet.  --will start monitoring BS ac/hs and use SSI for elevated BS --d/c Glucerna Monitor with increased activity Mild elevated on 8/14 10. Transaminitis: discontinue Tylenol   ALT elevated on 8/12 11. Vitamin D deficiency: start ergocalciferol 50,000U once per week for 7 weeks 13. Hyponatremia: Na 134- repeat outpatient    LOS: 4 days A FACE TO FACE EVALUATION WAS PERFORMED  Malik Jones 09/22/2020, 7:50 PM

## 2020-09-22 NOTE — Plan of Care (Signed)
  Problem: Consults Goal: RH STROKE PATIENT EDUCATION Description: See Patient Education module for education specifics  Outcome: Progressing   Problem: RH BOWEL ELIMINATION Goal: RH STG MANAGE BOWEL WITH ASSISTANCE Description: STG Manage Bowel with mod I Assistance. Outcome: Progressing Goal: RH STG MANAGE BOWEL W/MEDICATION W/ASSISTANCE Description: STG Manage Bowel with Medication with mod I Assistance. Outcome: Progressing   Problem: RH SAFETY Goal: RH STG ADHERE TO SAFETY PRECAUTIONS W/ASSISTANCE/DEVICE Description: STG Adhere to Safety Precautions With cues/reminders Assistance/Device. Outcome: Progressing   Problem: RH KNOWLEDGE DEFICIT Goal: RH STG INCREASE KNOWLEDGE OF DIABETES Description: Patient will be able to manage DM with medications and dietary modifications using handouts and educational tools independently Outcome: Progressing Goal: RH STG INCREASE KNOWLEDGE OF HYPERTENSION Description: Patient will be able to manage HTN with medications and dietary modifications using handouts and educational tools independently Outcome: Progressing Goal: RH STG INCREASE KNOWLEGDE OF HYPERLIPIDEMIA Description: Patient will be able to manage HLD with medications and dietary modifications using handouts and educational tools independently Outcome: Progressing Goal: RH STG INCREASE KNOWLEDGE OF STROKE PROPHYLAXIS Description: Patient will be able to manage secondary stroke risks with medications and dietary modifications using handouts and educational tools independently Outcome: Progressing

## 2020-09-23 LAB — GLUCOSE, CAPILLARY
Glucose-Capillary: 114 mg/dL — ABNORMAL HIGH (ref 70–99)
Glucose-Capillary: 137 mg/dL — ABNORMAL HIGH (ref 70–99)
Glucose-Capillary: 131 mg/dL — ABNORMAL HIGH (ref 70–99)
Glucose-Capillary: 148 mg/dL — ABNORMAL HIGH (ref 70–99)

## 2020-09-23 NOTE — Progress Notes (Signed)
Physical Therapy Session Note  Patient Details  Name: Malik Jones MRN: [id number] Date of Birth: Oct 06, 1960  Today's Date: 09/23/2020 PT Individual Time: 0800-0857 PT Individual Time Calculation (min): 57 min   Short Term Goals: Week 1:  PT Short Term Goal 1 (Week 1): STG = LTG due to ELOS  Skilled Therapeutic Interventions/Progress Updates:     Pt supine in bed to start session and is agreeable to therapy. No reports of pain but does report frustration regarding what he believed to have overheard the nursing staff say on Friday night (per chart review, charge RN had been notified). In a polite manner, he also reports eagerness to return home, reports he has plenty of family support and he can do his own rehab at home. Supine<>sit mod I and don's socks/shoes with setupA while seated EOB. Sit<>stand to RW with CGA and ambulates sinkside with CGA and RW where he brushes his teeth with CGA for balance and uses his ataxic RUE to brush. Offered to go outside for calming and redirection and allow change of scenery and pt very thankful. Ambulated from his room downstairs (via elevator) to outside with CGA and RW (intermittent minA needed during turns), ambulated >539f. While outdoors, deferred seated rest and during standing rest break, discussed DC planning and home safety. Pt reports he plans on going to his brothers or sisters house as he waits for his uncle to instal hand rails for his stairs at his house. All homes are 1 level with a few steps to enter with rails for support. While outdoors, he continues to require CGA and RW for gait while on unlevel concrete. Returns upstairs (via elevator) with CGA and RW until 1st seated rest break, ambulated >5069f Worked on dynamic standing balance and proprioception training - completed alternating toe taps to cones with minA and no UE support. Standing on blue-air-ex foam pad completed chest press, shldr press, bicep curls with 5#dowel rod and minA for  balance. Also completed low squats on blue foam pad with minA for balance. Pt ambulated back to his room from ortho gym, ~20035fwith CGA and RW. He ended session supine in bed with all needs in reach and bed alarm on.   Therapy Documentation Precautions:  Precautions Precautions: Fall Precaution Comments: RLE ataxia Restrictions Weight Bearing Restrictions: No General:     Therapy/Group: Individual Therapy  ChrAlger Simons15/2022, 7:46 AM

## 2020-09-23 NOTE — Progress Notes (Signed)
PROGRESS NOTE   Subjective/Complaints: No complaints this morning CBGs have been well controlled- may d/c ISS Labs stable this morning.   ROS: Denies CP, SOB, N/V/D  Objective:   No results found. No results for input(s): WBC, HGB, HCT, PLT in the last 72 hours.  No results for input(s): NA, K, CL, CO2, GLUCOSE, BUN, CREATININE, CALCIUM in the last 72 hours.  Intake/Output Summary (Last 24 hours) at 09/23/2020 1302 Last data filed at 09/23/2020 0900 Gross per 24 hour  Intake 834 ml  Output 1525 ml  Net -691 ml        Physical Exam: Vital Signs Blood pressure 140/88, pulse 80, temperature 98.1 F (36.7 C), temperature source Oral, resp. rate 17, height '5\' 6"'$  (1.676 m), weight 64.4 kg, SpO2 99 %. Gen: no distress, normal appearing HEENT: oral mucosa pink and moist, NCAT Cardio: Reg rate Chest: normal effort, normal rate of breathing Abd: soft, non-distended Ext: no edema Psych: pleasant, normal affect Skin: intact Musc: No edema in extremities.  No tenderness in extremities. Neuro: Alert Mild dysarthria, improving RUE: 4+/5 proximal to distal with atxtia, stable LUE/LLE: 5/5 proximal to distal   Assessment/Plan: 1. Functional deficits which require 3+ hours per day of interdisciplinary therapy in a comprehensive inpatient rehab setting. Physiatrist is providing close team supervision and 24 hour management of active medical problems listed below. Physiatrist and rehab team continue to assess barriers to discharge/monitor patient progress toward functional and medical goals  Care Tool:  Bathing    Body parts bathed by patient: Right arm, Left arm, Chest, Abdomen, Front perineal area, Buttocks, Right upper leg, Left upper leg, Right lower leg, Left lower leg, Face         Bathing assist Assist Level: Contact Guard/Touching assist     Upper Body Dressing/Undressing Upper body dressing   What is the  patient wearing?: Pull over shirt    Upper body assist Assist Level: Supervision/Verbal cueing    Lower Body Dressing/Undressing Lower body dressing      What is the patient wearing?: Pants     Lower body assist Assist for lower body dressing: Contact Guard/Touching assist     Toileting Toileting    Toileting assist Assist for toileting: Independent with assistive device Assistive Device Comment: urinal   Transfers Chair/bed transfer  Transfers assist     Chair/bed transfer assist level: Contact Guard/Touching assist     Locomotion Ambulation   Ambulation assist      Assist level: Minimal Assistance - Patient > 75% Assistive device: Walker-rolling Max distance: 551f   Walk 10 feet activity   Assist     Assist level: Minimal Assistance - Patient > 75% Assistive device: Walker-rolling   Walk 50 feet activity   Assist    Assist level: Minimal Assistance - Patient > 75% Assistive device: Walker-rolling    Walk 150 feet activity   Assist    Assist level: Minimal Assistance - Patient > 75% Assistive device: Walker-rolling    Walk 10 feet on uneven surface  activity   Assist     Assist level: Minimal Assistance - Patient > 75% Assistive device: WChemical engineer  Assist Will patient use wheelchair at discharge?: No      Wheelchair assist level: Contact Guard/Touching assist      Wheelchair 50 feet with 2 turns activity    Assist        Assist Level: Contact Guard/Touching assist   Wheelchair 150 feet activity     Assist      Assist Level: Contact Guard/Touching assist   Blood pressure 140/88, pulse 80, temperature 98.1 F (36.7 C), temperature source Oral, resp. rate 17, height '5\' 6"'$  (1.676 m), weight 64.4 kg, SpO2 99 %.  Medical Problem List and Plan: 1.  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  Continue CIR 2.  Antithrombotics: -DVT/anticoagulation:  Pharmaceutical: Other (comment)--Eliquis             -antiplatelet therapy: N/A 3. Pain Management: Tylenol prn             Monitor with increased ecterntion. 4. Mood: LCSW to follow for evaluation and support.              -antipsychotic agents: N/a 5. Neuropsych: This patient is capable of making decisions on his own behalf. 6. Skin/Wound Care: Routine pressure relief measures.  7. Fluids/Electrolytes/Nutrition: Monitor I/Os.    8.  HTN: Monitor blood pressures 3 times daily.   --Continue amlodipine and HCTZ, increase HCTZ to '25mg'$ .   -Magnesium and potassium levels reviewed and stable.             Monitor with increased mobility.   Controlled on 8/15 9.  New onset T2DM: Hemoglobin A1c 6.5.  RD consult to educate patient on CM diet.  --d/c ISS --d/c Glucerna Monitor with increased activity Mild elevated on 8/15 10. Transaminitis: discontinue Tylenol   ALT elevated on 8/12 11. Vitamin D deficiency: continue ergocalciferol 50,000U once per week for 7 weeks 13. Hyponatremia: Na 134- repeat outpatient    LOS: 5 days A FACE TO FACE EVALUATION WAS PERFORMED  Shriley Joffe P Iara Monds 09/23/2020, 1:02 PM

## 2020-09-23 NOTE — Progress Notes (Signed)
Speech Language Pathology Daily Session Note  Patient Details  Name: Malik Jones MRN: [id number] Date of Birth: February 02, 1961  Today's Date: 09/23/2020 SLP Individual Time: 1130-1230 SLP Individual Time Calculation (min): 60 min  Short Term Goals: Week 1: SLP Short Term Goal 1 (Week 1): Patient will achieve 90% intelligiblity during unstructured conversation and oral reading at paragraph level with minA cues. SLP Short Term Goal 2 (Week 1): Patient will participate in testing of higher level cognitive function. SLP Short Term Goal 3 (Week 1): Patient will utilize learned strategies for memory and be able to to recall and describe recent therapeutic (PT, OT, ST) and medical interventions/learned strategies/learned exercises with supervision A.  Skilled Therapeutic Interventions:Skilled ST services focused on cognitive skills. Pt supported speech and cognitive skills are at or near baseline. Pt presents with 80% intelligibility in conversation due to fast rate and occasional imprecise articulation. SLP facilitated mild - complex problem solving skills in money management and calendar task. Pt initially required min A verbal cues recall within task and error awareness impacted by poor hand witting due to RUE weakness, however pt was able to correct fading to supervision A verbal cues. SLP provided education to slow rate of speech and thinking skills to assess for errors in functional tasks, pt agreed. SLP communicated with pt's sister Malik Jones via phone who confirmed fast rate of speech at baseline, however intelligibility is occasional impacted to 70-80% intelligibility due to imprecise articulation. Pt was left in room with call bell within reach and bed alarm set. SLP recommends to continue skilled services.     Pain Pain Assessment Pain Scale: 0-10 Pain Score: 0-No pain  Therapy/Group: Individual Therapy  Malik Jones  [facility] 09/23/2020, 1:04 PM

## 2020-09-23 NOTE — Progress Notes (Signed)
Occupational Therapy Session Note  Patient Details  Name: Malik Jones MRN: [id number] Date of Birth: 11-18-1960  Today's Date: 09/23/2020 OT Individual Time: [id number] OT Individual Time Calculation (min): 26 min    Short Term Goals: Week 1:  OT Short Term Goal 1 (Week 1): STG = LTG due to estimated LOS   Skilled Therapeutic Interventions/Progress Updates:    Pt greeted at time of session semireclined agreeable to OT session, no pain throughout. Donned shoes with set up including socks and tying laces with B hands. Ambulated room <> day room with RW and ataxic gait noted, transferring to/from mat with Min A for balance as pt did scissor LE's and needed cues for pacing. Focus of session on weight bearing for BUEs and NMR, weight bearing on large therapy ball presses for 1x15, wall push ups for 2x20 and functional mobility throughout gym with and without AD to improve body awareness. Back in room set up with alarm on call bell in reach.   Therapy Documentation Precautions:  Precautions Precautions: Fall Precaution Comments: RLE ataxia Restrictions Weight Bearing Restrictions: No     Therapy/Group: Individual Therapy  Viona Gilmore 09/23/2020, 12:58 PM

## 2020-09-23 NOTE — Plan of Care (Signed)
  RD consulted for nutrition education regarding diabetes.   Lab Results  Component Value Date   HGBA1C 6.5 (H) 09/12/2020    RD provided "Carbohydrate Counting for People with Diabetes" handout from the Academy of Nutrition and Dietetics. Discussed different food groups and their effects on blood sugar, emphasizing carbohydrate-containing foods. Provided list of carbohydrates and recommended serving sizes of common foods.  Discussed importance of controlled and consistent carbohydrate intake throughout the day. Provided examples of ways to balance meals/snacks and encouraged intake of high-fiber, whole grain complex carbohydrates. Discussed diabetic friendly drink options. Teach back method used.  Expect good compliance.  Current diet order is carbohydrate modified, patient is consuming approximately 100% of meals at this time. Pt reports having a good appetite currently and PTA with no difficulties. Labs and medications reviewed. No further nutrition interventions warranted at this time. RD contact information provided. If additional nutrition issues arise, please re-consult RD.  Corrin Parker, MS, RD, LDN RD pager number/after hours weekend pager number on Amion.

## 2020-09-24 LAB — GLUCOSE, CAPILLARY
Glucose-Capillary: 135 mg/dL — ABNORMAL HIGH (ref 70–99)
Glucose-Capillary: 119 mg/dL — ABNORMAL HIGH (ref 70–99)
Glucose-Capillary: 116 mg/dL — ABNORMAL HIGH (ref 70–99)
Glucose-Capillary: 99 mg/dL (ref 70–99)

## 2020-09-24 MED ORDER — HYDROCHLOROTHIAZIDE 12.5 MG PO CAPS
37.5000 mg | ORAL_CAPSULE | Freq: Every day | ORAL | Status: DC
  Administered 2020-09-25 – 2020-09-26 (×2): 37.5 mg via ORAL
  Filled 2020-09-24 (×2): qty 3

## 2020-09-24 NOTE — Progress Notes (Signed)
Patient ID: Malik Jones, male   DOB: 1960-08-18, 60 y.o.   MRN:  Met with pot to update regarding team conference goals of supervision level and discharge 8/18. Pt is upset he is not going home sooner. Have ordered him a rolling walker in case needed and will talk with team regarding moving up discharge date. Have sent OP referral to [facility].

## 2020-09-24 NOTE — Patient Care Conference (Signed)
Inpatient RehabilitationTeam Conference and Plan of Care Update Date: 09/24/2020   Time: 13:10 PM    Patient Name: Malik Jones      Medical Record Number: [id number]  Date of Birth: 10/19/1960 Sex: Male         Room/Bed: 4W17C/4W17C-01 Payor Info: Payor: [place] / Plan: [employer] COMM PPO / Product Type: *No Product type* /    Admit Date/Time:  09/18/2020  4:19 PM  Primary Diagnosis:  Cerebellar stroke [facility])  Hospital Problems: Principal Problem:   Cerebellar stroke ([place]) Active Problems:   Transaminitis   Diabetes mellitus, new onset ([place])   Hyponatremia    Expected Discharge Date: Expected Discharge Date: 09/27/20  Team Members Present: Physician leading conference: Dr. Leeroy Cha Social Worker Present: Ovidio Kin, LCSW Nurse Present: Dorien Chihuahua, RN PT Present: Ginnie Smart, PT OT Present: Willeen Cass, OT SLP Present: Charolett Bumpers, SLP PPS Coordinator present : Ileana Ladd, PT     Current Status/Progress Goal Weekly Team Focus  Bowel/Bladder             Swallow/Nutrition/ Hydration             ADL's   bathing at shower level-supervision; LB dressing-CGA; functional transfers-supervision/CGA  supervision overall  education, safety awareness, RUE NMR, discharge planning   Mobility   mod I bed mobility, supervision transfers, CGA/supervision gait with RW  mod I (pt reports he may be living with brother/sister at DC who will provide SPV, so goals can be downgraded)  Proprioception training for RLE, gait training with LRAD, higher level dynamic balance training, DC planning as pt is eager to return home   Communication   Supervision A 75-80% intelligible, fast rate at baseline working on awareness and overarticulation  mod I speech strategies  education and speech strategies   Safety/Cognition/ Behavioral Observations  supervision A  mod I  education, awareness, recall and higher level problem solving   Pain             Skin                Discharge Planning:  Either home to his home or to brother's and sister's who can provide supervision. Will need OP at DC   Team Discussion: BP elevated; medications adjusted per MD. Education provided on prediabetes and dietary modifications. Vitamin supplement added per MD.  Patient on target to meet rehab goals: yes, currently supervision for sit - stand and CGA for ambulation with a rolling walker. Ataxia affects balance; requires min - mod assist for balance without a walker. Completes ADLs with supervision - min assist. Family reports speech/cognition near baseline; requires supervision for problem solving, error awareness, and rate control.  Goals for discharge set at supervision overall.  *See Care Plan and progress notes for long and short-term goals.   Revisions to Treatment Plan:  Downgraded goals to supervision level Working on proprioception and dynamic balance training  Teaching Needs: Medications, dietary modifications,secondary stroke risk management, safety, etc  Current Barriers to Discharge: Decreased caregiver support and Home enviroment access/layout  Possible Resolutions to Barriers: Family education OP follow up services DME recommended; walker, shower seat, and BSC     Medical Summary Current Status: hypertension, type 2 DM with hyperglycemia, hyponatremia, vitamin D deficiency  Barriers to Discharge: Medical stability;New diabetic  Barriers to Discharge Comments: hypertension, type 2 DM with hyperglycemia, hyponatremia, vitamin D deficiency Possible Resolutions to Barriers/Weekly Focus: increase HCTZ to 37.5, provided dietary education, can decrease CBG checks to  AC, continue ergocalciferol 50,000U once per week for 7 weeks.   Continued Need for Acute Rehabilitation Level of Care: The patient requires daily medical management by a physician with specialized training in physical medicine and rehabilitation for the following reasons: Direction of a  multidisciplinary physical rehabilitation program to maximize functional independence : Yes Medical management of patient stability for increased activity during participation in an intensive rehabilitation regime.: Yes Analysis of laboratory values and/or radiology reports with any subsequent need for medication adjustment and/or medical intervention. : Yes   I attest that I was present, lead the team conference, and concur with the assessment and plan of the team.   Dorien Chihuahua B 09/24/2020, 4:05 PM

## 2020-09-24 NOTE — Progress Notes (Signed)
Occupational Therapy Session Note  Patient Details  Name: Abdo Habeeb MRN: [id number] Date of Birth: 08-12-1960  Today's Date: 09/24/2020 OT Individual Time: [id number] OT Individual Time Calculation (min): 45 min    Short Term Goals: Week 1:  OT Short Term Goal 1 (Week 1): STG = LTG due to estimated LOS  Skilled Therapeutic Interventions/Progress Updates:    Pt resting in bed upon arrival just finishing breakfast. Pt agreeable to bathing at shower level and dressing with sit<>stand from EOB. Pt amb without AD to bathroom with min A. Pt amb with RW from bathroom with CGA. Bathing at shower level with sit<>stand from TTB with supervision. Pt returned to room and completed dressing with sit<>stand from EOB. CGA for standing balance to pull pants over hips. Pt completed grooming tasks standing at sink with close supervision. Pt amb with RW through day room and back to room. Pt with exaggerated RLE swing through and slight LOBX1 when turning. Pt able to self correct. Pt returned to bed and remained in bed with all needs within reach and bed alarm activated.   Therapy Documentation Precautions:  Precautions Precautions: Fall Precaution Comments: RLE ataxia Restrictions Weight Bearing Restrictions: No General:   Vital Signs: Therapy Vitals Temp: 97.8 F (36.6 C) Temp Source: Oral Pulse Rate: 78 Resp: 14 BP: (!) 137/96 Patient Position (if appropriate): Lying Oxygen Therapy SpO2: 100 % O2 Device: Room Air Pain:   ADL: ADL Eating: Set up Where Assessed-Eating: Bed level Grooming: Contact guard Where Assessed-Grooming: Standing at sink Upper Body Bathing: Setup Where Assessed-Upper Body Bathing: Shower Lower Body Bathing: Contact guard Where Assessed-Lower Body Bathing: Shower Upper Body Dressing: Setup Where Assessed-Upper Body Dressing: Chair Lower Body Dressing: Contact guard Where Assessed-Lower Body Dressing: Chair Toileting: Contact guard Where Assessed-Toileting:  Glass blower/designer: Therapist, music Method: Counselling psychologist: Grab bars, Raised toilet seat Social research officer, government: Curator Method: Heritage manager: Educational psychologist   Exercises:   Other Treatments:     Therapy/Group: Individual Therapy  Leroy Libman 09/24/2020, 7:51 AM

## 2020-09-24 NOTE — Progress Notes (Signed)
Occupational Therapy Session Note  Patient Details  Name: Malik Jones MRN: [id number] Date of Birth: 09-09-1960  Today's Date: 09/24/2020 OT Individual Time: [id number] OT Individual Time Calculation (min): 44 min    Short Term Goals: Week 1:  OT Short Term Goal 1 (Week 1): STG = LTG due to estimated LOS  Skilled Therapeutic Interventions/Progress Updates:    Pt in bed to start with transfer to the EOB at supervision level.  He was able to donn his shoes and tie them with supervision and then he ambulated down to the therapy gym with use of the walker and min guard assist.  Once in the gym had him focus on standing balance while completing 2 sets of bilateral shoulder flexion 15 reps in standing, using a 4 lb dowel rod.  Slight ataxia noted in his trunk and hips with activity.  He completed 1 set of 20 reps bilateral elbow flexion in sitting.  Worked on volleyball task with therapist tossing the ball to him in sitting and having him hit it back with the dowel rod in both hands as well.  Also had him stand with use of the volleyball for toss and catch with use of the rebounder.  He was able to complete task with min assist overall and 80% accuracy for hitting the rebounder in the black circle.  Slight increased ataxia and decreased coordination noted when catching the ball, in the RUE only.  Finished session with functional mobility back to the room at min assist level without an assistive device.  Call button and phone in reach with safety alarm belt in place.    Therapy Documentation Precautions:  Precautions Precautions: Fall Precaution Comments: RLE ataxia Restrictions Weight Bearing Restrictions: No   Pain: Pain Assessment Pain Scale: Faces Pain Score: 0-No pain ADL: See Care Tool Section for some details of mobility and selfcare   Therapy/Group: Individual Therapy  Neisha Hinger OTR/L 09/24/2020, 4:31 PM

## 2020-09-24 NOTE — Progress Notes (Signed)
Physical Therapy Session Note  Patient Details  Name: Malik Jones MRN: [id number] Date of Birth: [birth year]  Today's Date: 09/24/2020 PT Individual Time: [id number] PT Individual Time Calculation (min): 70 min   Short Term Goals: Week 1:  PT Short Term Goal 1 (Week 1): STG = LTG due to ELOS  Skilled Therapeutic Interventions/Progress Updates:     Pt supine in bed to start session - awake and agreeable to therapy. No reports of pain but continues to politely report readiness to DC and return home. Reports he will be going to his brother's house (brother is retired and can provide 24/7 S/A). Bed mobility completed mod I. Sit<>stand supervision to RW. Ambulates from his room to main rehab gym, ~1110f with close supervision and RW - demo's great safety awareness, especially with turning, by stopping to turn to avoid LOB. Completed a variety of exercises and therapeutic activities including goblet squats to mat table with 3kg med ball, squats in // bars, alternating toe taps to bosu ball, sit-ups with 3kg med ball, shldr press with 3kg med ball, and quadruped bird-dogs. Pt required CGA for all activities except for bosu ball where he required modA for balance due to frequent LOB while stepping up with the R foot. He also completed stair training where he navigated up/down 6inch steps and 3 inch steps with supervision and 2 hand rails, a total of 12 each. Practiced navigating a 8inch curb with RW with CGA and cues for sequencing and technique. Also worked on ambulating in ADL apartment and furniture transfers from both recliner and low sitting sofa couch. He ambulated back to his room with CGA and RW at end of session (CGA provided due to fatigue from therapy session) and completed bed mobility mod I. Remained supine in bed with all needs in reach and bed alarm on.   Therapy Documentation Precautions:  Precautions Precautions: Fall Precaution Comments: RLE ataxia Restrictions Weight Bearing  Restrictions: No General:    Therapy/Group: Individual Therapy  Deeandra Jerry P Azaliyah Kennard 09/24/2020, 7:30 AM

## 2020-09-24 NOTE — Progress Notes (Signed)
Patient ID: Malik Jones, male   DOB: 1960-02-15, 60 y.o.   MRN: [id number] Met with the patient to follow up on medications and preparation for pending discharge. Patient visibly upset; reported he wanted to go home sooner than discharge date listed by team. Felt like he was doing well enough to be discharged and did not feel he needed the additional days to be able to go home with his brother and other family would be available to assist. Noted sister in law worked but would be responsible for cooking meals. Reviewed medications and dietary modifications with the patient and left handouts for his sister-in-law's reference. Also given handout on cooking without salt, using herbs and spices. Also discussed use of an air fryer instead of frying foods. Continue to follow along to discharge to address educational needs. Margarito Liner

## 2020-09-24 NOTE — Progress Notes (Signed)
Speech Language Pathology Daily Session Note  Patient Details  Name: Malik Jones MRN: [id number] Date of Birth: 02/20/1960  Today's Date: 09/24/2020 SLP Individual Time: 1017-1100 SLP Individual Time Calculation (min): 43 min  Short Term Goals: Week 1: SLP Short Term Goal 1 (Week 1): Patient will achieve 90% intelligiblity during unstructured conversation and oral reading at paragraph level with minA cues. SLP Short Term Goal 2 (Week 1): Patient will participate in testing of higher level cognitive function. SLP Short Term Goal 3 (Week 1): Patient will utilize learned strategies for memory and be able to to recall and describe recent therapeutic (PT, OT, ST) and medical interventions/learned strategies/learned exercises with supervision A.  Skilled Therapeutic Interventions:Skilled ST services focused on cognitive skills. Pt required supervision A verbal cues to recall yesterday's ST events. SLP facilitated BID pill organizer with current medication pt demonstrated mod I. Pt also completed novel mildly complex problem solving and organization task (organizing closet), pt required min A verbal cues for error awareness/problem solving and use of recall strategies within task. SLP provided education to slow rate to check for errors and to aid in recall within task. SLP and pt began similar task however limited due to time restriction and SLP left it for pt to attempt for homework if able. Pt was left in room with call bell within reach and bed alarm set. SLP recommends to continue skilled services.     Pain Pain Assessment Pain Score: 0-No pain  Therapy/Group: Individual Therapy  Malik Jones  [facility] 09/24/2020, 11:41 AM

## 2020-09-24 NOTE — Progress Notes (Signed)
PROGRESS NOTE   Subjective/Complaints: No complaints this morning BP is elevated: increase HCTZ to 37.'5mg'$ - repeat Na tomorrow given hyponatremia and this increase CBGs elevated; provided dietary education  ROS: Denies CP, SOB, N/V/D  Objective:   No results found. No results for input(s): WBC, HGB, HCT, PLT in the last 72 hours.  No results for input(s): NA, K, CL, CO2, GLUCOSE, BUN, CREATININE, CALCIUM in the last 72 hours.  Intake/Output Summary (Last 24 hours) at 09/24/2020 1240 Last data filed at 09/24/2020 0730 Gross per 24 hour  Intake 800 ml  Output 900 ml  Net -100 ml        Physical Exam: Vital Signs Blood pressure (!) 137/96, pulse 78, temperature 97.8 F (36.6 C), temperature source Oral, resp. rate 14, height '5\' 6"'$  (1.676 m), weight 64.4 kg, SpO2 100 %. Gen: no distress, normal appearing HEENT: oral mucosa pink and moist, NCAT Cardio: Reg rate Chest: normal effort, normal rate of breathing Abd: soft, non-distended Ext: no edema Psych: pleasant, normal affect Skin: intact Musc: No edema in extremities.  No tenderness in extremities. Neuro: Alert, higher level cognitive deficits Mild dysarthria, improving RUE: 4+/5 proximal to distal with atxtia, stable LUE/LLE: 5/5 proximal to distal   Assessment/Plan: 1. Functional deficits which require 3+ hours per day of interdisciplinary therapy in a comprehensive inpatient rehab setting. Physiatrist is providing close team supervision and 24 hour management of active medical problems listed below. Physiatrist and rehab team continue to assess barriers to discharge/monitor patient progress toward functional and medical goals  Care Tool:  Bathing    Body parts bathed by patient: Right arm, Left arm, Chest, Abdomen, Front perineal area, Buttocks, Right upper leg, Left upper leg, Right lower leg, Left lower leg, Face         Bathing assist Assist Level:  Supervision/Verbal cueing     Upper Body Dressing/Undressing Upper body dressing   What is the patient wearing?: Pull over shirt    Upper body assist Assist Level: Independent    Lower Body Dressing/Undressing Lower body dressing      What is the patient wearing?: Pants     Lower body assist Assist for lower body dressing: Contact Guard/Touching assist     Toileting Toileting    Toileting assist Assist for toileting: Independent with assistive device Assistive Device Comment: urinal   Transfers Chair/bed transfer  Transfers assist     Chair/bed transfer assist level: Contact Guard/Touching assist     Locomotion Ambulation   Ambulation assist      Assist level: Minimal Assistance - Patient > 75% Assistive device: Walker-rolling Max distance: 517f   Walk 10 feet activity   Assist     Assist level: Minimal Assistance - Patient > 75% Assistive device: Walker-rolling   Walk 50 feet activity   Assist    Assist level: Minimal Assistance - Patient > 75% Assistive device: Walker-rolling    Walk 150 feet activity   Assist    Assist level: Minimal Assistance - Patient > 75% Assistive device: Walker-rolling    Walk 10 feet on uneven surface  activity   Assist     Assist level: Minimal Assistance - Patient > 75%  Assistive device: Aeronautical engineer Will patient use wheelchair at discharge?: No      Wheelchair assist level: Clinical research associate 50 feet with 2 turns activity    Assist        Assist Level: Contact Guard/Touching assist   Wheelchair 150 feet activity     Assist      Assist Level: Contact Guard/Touching assist   Blood pressure (!) 137/96, pulse 78, temperature 97.8 F (36.6 C), temperature source Oral, resp. rate 14, height '5\' 6"'$  (1.676 m), weight 64.4 kg, SpO2 100 %.  Medical Problem List and Plan: 1.  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  Continue CIR  -Interdisciplinary Team Conference today   2.  Antithrombotics: -DVT/anticoagulation:  Pharmaceutical: Other (comment)--Eliquis             -antiplatelet therapy: N/A 3. Pain Management: Tylenol prn             Monitor with increased ecterntion. 4. Mood: LCSW to follow for evaluation and support.              -antipsychotic agents: N/a 5. Neuropsych: This patient is capable of making decisions on his own behalf. 6. Skin/Wound Care: Routine pressure relief measures.  7. Fluids/Electrolytes/Nutrition: Monitor I/Os.    8.  HTN: Monitor blood pressures 3 times daily.   --Continue amlodipine and HCTZ, increase HCTZ to 37.'5mg'$ .   -Magnesium and potassium levels reviewed and stable.             Monitor with increased mobility.  9.  New onset T2DM: Hemoglobin A1c 6.5.  RD consult to educate patient on CM diet.  --d/c ISS --d/c Glucerna Monitor with increased activity Mild elevated on 8/15 10. Transaminitis: discontinue Tylenol   ALT elevated on 8/12 11. Vitamin D deficiency: continue ergocalciferol 50,000U once per week for 7 weeks 13. Hyponatremia: Na 134- repeat BMP tomorrow    LOS: 6 days A FACE TO FACE EVALUATION WAS PERFORMED  Martha Clan P Barbarann Kelly 09/24/2020, 12:40 PM

## 2020-09-25 LAB — BASIC METABOLIC PANEL
Anion gap: 9 (ref 5–15)
BUN: 21 mg/dL — ABNORMAL HIGH (ref 6–20)
CO2: 26 mmol/L (ref 22–32)
Calcium: 10 mg/dL (ref 8.9–10.3)
Chloride: 98 mmol/L (ref 98–111)
Creatinine, Ser: 1.15 mg/dL (ref 0.61–1.24)
GFR, Estimated: >60 mL/min (ref >60)
Glucose, Bld: 107 mg/dL — ABNORMAL HIGH (ref 70–99)
Potassium: 4.3 mmol/L (ref 3.5–5.1)
Sodium: 133 mmol/L — ABNORMAL LOW (ref 135–145)

## 2020-09-25 LAB — GLUCOSE, CAPILLARY
Glucose-Capillary: 107 mg/dL — ABNORMAL HIGH (ref 70–99)
Glucose-Capillary: 102 mg/dL — ABNORMAL HIGH (ref 70–99)

## 2020-09-25 MED ORDER — ATORVASTATIN CALCIUM 40 MG PO TABS: 40.0000 mg | ORAL_TABLET | Freq: Every day | ORAL | 0 refills | Status: DC

## 2020-09-25 MED ORDER — HYDROCHLOROTHIAZIDE 12.5 MG PO CAPS: 37.5000 mg | ORAL_CAPSULE | Freq: Every day | ORAL | 0 refills | Status: DC

## 2020-09-25 MED ORDER — VITAMIN D (ERGOCALCIFEROL) 1.25 MG (50000 UNIT) PO CAPS: 50000.0000 [IU] | ORAL_CAPSULE | ORAL | 0 refills | Status: DC

## 2020-09-25 MED ORDER — APIXABAN 5 MG PO TABS: 5.0000 mg | ORAL_TABLET | Freq: Two times a day (BID) | ORAL | 0 refills | Status: DC

## 2020-09-25 MED ORDER — ACETAMINOPHEN 325 MG PO TABS: 325.0000 mg | ORAL_TABLET | ORAL | Status: DC | PRN

## 2020-09-25 MED ORDER — AMLODIPINE BESYLATE 10 MG PO TABS: 10.0000 mg | ORAL_TABLET | Freq: Every day | ORAL | 0 refills | Status: DC

## 2020-09-25 NOTE — Progress Notes (Signed)
PROGRESS NOTE   Subjective/Complaints: Slept poorly last night due to loud neighboring patients Very fatigued this AM as a result Na 133- relatively stable despite increase in HCTZ- continue to monitor outpatient.   ROS: Denies CP, SOB, N/V/D  Objective:   No results found. No results for input(s): WBC, HGB, HCT, PLT in the last 72 hours.  Recent Labs    09/25/20 0507  NA 133*  K 4.3  CL 98  CO2 26  GLUCOSE 107*  BUN 21*  CREATININE 1.15  CALCIUM 10.0    Intake/Output Summary (Last 24 hours) at 09/25/2020 1227 Last data filed at 09/25/2020 0700 Gross per 24 hour  Intake 780 ml  Output 525 ml  Net 255 ml        Physical Exam: Vital Signs Blood pressure 139/85, pulse 84, temperature 98 F (36.7 C), temperature source Oral, resp. rate 14, height '5\' 6"'$  (1.676 m), weight 64.4 kg, SpO2 100 %. Gen: no distress, normal appearing HEENT: oral mucosa pink and moist, NCAT Cardio: Reg rate Chest: normal effort, normal rate of breathing Abd: soft, non-distended Ext: no edema Psych: pleasant, normal affect Musc: No edema in extremities.  No tenderness in extremities. Neuro: Alert, higher level cognitive deficits Mild dysarthria, improving RUE: 4+/5 proximal to distal with atxtia, stable LUE/LLE: 5/5 proximal to distal   Assessment/Plan: 1. Functional deficits which require 3+ hours per day of interdisciplinary therapy in a comprehensive inpatient rehab setting. Physiatrist is providing close team supervision and 24 hour management of active medical problems listed below. Physiatrist and rehab team continue to assess barriers to discharge/monitor patient progress toward functional and medical goals  Care Tool:  Bathing    Body parts bathed by patient: Right arm, Left arm, Chest, Abdomen, Front perineal area, Buttocks, Right upper leg, Left upper leg, Right lower leg, Left lower leg, Face         Bathing assist  Assist Level: Supervision/Verbal cueing     Upper Body Dressing/Undressing Upper body dressing   What is the patient wearing?: Pull over shirt    Upper body assist Assist Level: Independent    Lower Body Dressing/Undressing Lower body dressing      What is the patient wearing?: Pants     Lower body assist Assist for lower body dressing: Contact Guard/Touching assist     Toileting Toileting    Toileting assist Assist for toileting: Independent with assistive device Assistive Device Comment: urinal   Transfers Chair/bed transfer  Transfers assist     Chair/bed transfer assist level: Contact Guard/Touching assist     Locomotion Ambulation   Ambulation assist      Assist level: Minimal Assistance - Patient > 75% Assistive device: No Device Max distance: 150'   Walk 10 feet activity   Assist     Assist level: Minimal Assistance - Patient > 75% Assistive device: Walker-rolling   Walk 50 feet activity   Assist    Assist level: Minimal Assistance - Patient > 75% Assistive device: Walker-rolling    Walk 150 feet activity   Assist    Assist level: Minimal Assistance - Patient > 75% Assistive device: Walker-rolling    Walk 10 feet on  uneven surface  activity   Assist     Assist level: Minimal Assistance - Patient > 75% Assistive device: Aeronautical engineer Will patient use wheelchair at discharge?: No      Wheelchair assist level: Contact Guard/Touching assist      Wheelchair 50 feet with 2 turns activity    Assist        Assist Level: Contact Guard/Touching assist   Wheelchair 150 feet activity     Assist      Assist Level: Contact Guard/Touching assist   Blood pressure 139/85, pulse 84, temperature 98 F (36.7 C), temperature source Oral, resp. rate 14, height '5\' 6"'$  (1.676 m), weight 64.4 kg, SpO2 100 %.  Medical Problem List and Plan: 1.  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  Continue CIR 2.  Antithrombotics: -DVT/anticoagulation:  Pharmaceutical: Other (comment)--Eliquis             -antiplatelet therapy: N/A 3. Pain Management: Tylenol prn             Monitor with increased ecterntion. 4. Mood: LCSW to follow for evaluation and support.              -antipsychotic agents: N/a 5. Neuropsych: This patient is capable of making decisions on his own behalf. 6. Skin/Wound Care: Routine pressure relief measures.  7. Fluids/Electrolytes/Nutrition: Monitor I/Os.    8.  HTN: Monitor blood pressures 3 times daily.   --Continue amlodipine and HCTZ, increase HCTZ to 37.'5mg'$ .   -Magnesium and potassium levels reviewed and stable.             Monitor with increased mobility.  9.  New onset T2DM: Hemoglobin A1c 6.5.  RD consult to educate patient on CM diet.  --d/c ISS --d/c Glucerna Monitor with increased activity Mild elevated on 8/17- d/c CBGs 10. Transaminitis: discontinue Tylenol   ALT elevated on 8/12 11. Vitamin D deficiency: continue ergocalciferol 50,000U once per week for 7 weeks 13. Hyponatremia: Na 133- monitor outpatient    LOS: 7 days A FACE TO FACE EVALUATION WAS PERFORMED  Malik Jones 09/25/2020, 12:27 PM

## 2020-09-25 NOTE — Discharge Summary (Signed)
Physical Therapy Discharge Summary  Patient Details  Name: Malik Jones MRN: [id number] Date of Birth: [birth year]  Today's Date: 09/25/2020  Patient has met 8 of 9 long term goals due to improved activity tolerance, improved balance, improved postural control, increased strength, ability to compensate for deficits, functional use of  right upper extremity and right lower extremity, improved awareness, and improved coordination.  Patient to discharge at an ambulatory level Supervision.   Patient's care partner is independent to provide the necessary physical and cognitive assistance at discharge.  Reasons goals not met: Did not address floor transfer goal but anticipate pt would be able to complete with supervision.   Recommendation:  Patient will benefit from ongoing skilled PT services in outpatient setting to continue to advance safe functional mobility, address ongoing impairments in truncal and R hemibody ataxia, dynamic standing balance, gait deficits, and minimize fall risk.  Equipment: RW  Reasons for discharge: treatment goals met and discharge from hospital  Patient/family agrees with progress made and goals achieved: Yes  PT Discharge Precautions/Restrictions Precautions Precautions: Fall Precaution Comments: RLE ataxia Restrictions Weight Bearing Restrictions: No Pain Pain Assessment Pain Scale: 0-10 Pain Score: 0-No pain Vision/Perception  Perception Perception: Within Functional Limits Praxis Praxis: Intact  Cognition Overall Cognitive Status: Within Functional Limits for tasks assessed Arousal/Alertness: Awake/alert Orientation Level: Oriented X4 Attention: Sustained Sustained Attention: Appears intact Selective Attention: Appears intact Memory: Appears intact Awareness: Appears intact Problem Solving: Appears intact Safety/Judgment: Appears intact Sensation Sensation Light Touch: Impaired by gross assessment Hot/Cold: Appears Intact Proprioception:  Appears Intact Stereognosis: Not tested Additional Comments: Pt reporting decreased sensation on Left side of body ("dull" compared to right). Able to locate stimulus with light touch testing Coordination Gross Motor Movements are Fluid and Coordinated: No Fine Motor Movements are Fluid and Coordinated: No Finger Nose Finger Test: moderate dysmetria right Heel Shin Test: Imparied coordination on RLE ataxic movement pattern  Motor  Motor Motor: Ataxia;Hemiplegia Motor - Discharge Observations: Ataxia > weakness. Truncal and R hemibody affected with ataxia  Mobility Bed Mobility Bed Mobility: Supine to Sit;Sit to Supine;Rolling Right;Rolling Left Rolling Right: Independent Rolling Left: Independent Supine to Sit: Independent Sit to Supine: Independent Transfers Transfers: Sit to Stand;Stand to Sit;Stand Pivot Transfers Sit to Stand: Independent with assistive device Stand to Sit: Independent with assistive device Stand Pivot Transfers: Supervision/Verbal cueing Stand Pivot Transfer Details: Verbal cues for precautions/safety;Verbal cues for gait pattern;Verbal cues for safe use of DME/AE;Verbal cues for sequencing Transfer (Assistive device): Rolling walker Locomotion  Gait Ambulation: Yes Gait Assistance: Supervision/Verbal cueing Gait Distance (Feet): 300 Feet Assistive device: Rolling walker Gait Assistance Details: Verbal cues for sequencing;Verbal cues for technique;Verbal cues for precautions/safety;Verbal cues for gait pattern;Verbal cues for safe use of DME/AE Gait Gait: Yes Gait Pattern: Impaired Gait Pattern: Decreased step length - left;Decreased stance time - left;Decreased hip/knee flexion - right;Decreased weight shift to left;Ataxic;Narrow base of support (Increased R heel strike and R hip abdduction) Gait velocity: Decreased Stairs / Additional Locomotion Stairs: Yes Stairs Assistance: Supervision/Verbal cueing Stair Management Technique: Two rails Number of  Stairs: 12 Height of Stairs: 6 Curb: Contact Guard/Touching assist Wheelchair Mobility Wheelchair Mobility: No  Trunk/Postural Assessment  Cervical Assessment Cervical Assessment: Within Functional Limits Thoracic Assessment Thoracic Assessment: Within Functional Limits Lumbar Assessment Lumbar Assessment: Within Functional Limits Postural Control Postural Control: Within Functional Limits Righting Reactions: Truncal and RLE ataxia impacting  Balance Balance Balance Assessed: Yes Standardized Balance Assessment Standardized Balance Assessment: Berg Balance Test;Timed Up and Go Test [employer]  Test Sit to Stand: Able to stand without using hands and stabilize independently Standing Unsupported: Able to stand safely 2 minutes Sitting with Back Unsupported but Feet Supported on Floor or Stool: Able to sit safely and securely 2 minutes Stand to Sit: Sits safely with minimal use of hands Transfers: Able to transfer safely, minor use of hands Standing Unsupported with Eyes Closed: Able to stand 10 seconds safely Standing Ubsupported with Feet Together: Able to place feet together independently and stand 1 minute safely From Standing, Reach Forward with Outstretched Arm: Can reach forward >12 cm safely (5") From Standing Position, Pick up Object from Floor: Able to pick up shoe safely and easily From Standing Position, Turn to Look Behind Over each Shoulder: Looks behind from both sides and weight shifts well Turn 360 Degrees: Able to turn 360 degrees safely but slowly Standing Unsupported, Alternately Place Feet on Step/Stool: Able to complete >2 steps/needs minimal assist Standing Unsupported, One Foot in Front: Able to plae foot ahead of the other independently and hold 30 seconds Standing on One Leg: Able to lift leg independently and hold 5-10 seconds Total Score: 48/56  Timed Up and Go Test TUG: Normal TUG Normal TUG (seconds): 13.3 (13.3 without RW. 18.8 with RW. minA needed  without RW, CGA needed wtih RW)  Extremity Assessment  RLE Assessment RLE Assessment: Exceptions to [facility] General Strength Comments: Grossly 4/5 LLE Assessment LLE Assessment: Within Functional Limits    Paytyn Mesta P Nyela Cortinas PT 09/25/2020, 10:59 AM

## 2020-09-25 NOTE — Progress Notes (Signed)
Patient ID: Malik Jones, male   DOB: [birth year], 60 y.o.   MRN: [id number]  Pt wants to go home tomorrow if not sooner, he feels he can do better at home. Team feels ready to go tomorrow. Awaiting MD approval.

## 2020-09-25 NOTE — Plan of Care (Signed)
  Problem: RH Balance Goal: LTG Patient will maintain dynamic standing balance (PT) Description: LTG:  Patient will maintain dynamic standing balance with assistance during mobility activities (PT) Flowsheets (Taken 09/25/2020 0757) LTG: Pt will maintain dynamic standing balance during mobility activities with:: Supervision/Verbal cueing Note: Downgraded due to progress   Problem: RH Bed to Chair Transfers Goal: LTG Patient will perform bed/chair transfers w/assist (PT) Description: LTG: Patient will perform bed to chair transfers with assistance (PT). Flowsheets (Taken 09/25/2020 0757) LTG: Pt will perform Bed to Chair Transfers with assistance level: Supervision/Verbal cueing Note: Downgraded due to progress   Problem: RH Car Transfers Goal: LTG Patient will perform car transfers with assist (PT) Description: LTG: Patient will perform car transfers with assistance (PT). Flowsheets (Taken 09/25/2020 0757) LTG: Pt will perform car transfers with assist:: Supervision/Verbal cueing Note: Downgraded due to progress   Problem: RH Ambulation Goal: LTG Patient will ambulate in controlled environment (PT) Description: LTG: Patient will ambulate in a controlled environment, # of feet with assistance (PT). Flowsheets (Taken 09/25/2020 0757) LTG: Pt will ambulate in controlled environ  assist needed:: Supervision/Verbal cueing LTG: Ambulation distance in controlled environment: 250 Note: Downgraded due to progress Goal: LTG Patient will ambulate in home environment (PT) Description: LTG: Patient will ambulate in home environment, # of feet with assistance (PT). Flowsheets (Taken 09/25/2020 0757) LTG: Pt will ambulate in home environ  assist needed:: Supervision/Verbal cueing LTG: Ambulation distance in home environment: 150 Note: Downgraded due to progress   Problem: RH Stairs Goal: LTG Patient will ambulate up and down stairs w/assist (PT) Description: LTG: Patient will ambulate up and down #  of stairs with assistance (PT) Flowsheets (Taken 09/25/2020 0757) LTG: Pt will ambulate up/down stairs assist needed:: Supervision/Verbal cueing LTG: Pt will  ambulate up and down number of stairs: 4 Note: Downgraded due to progress

## 2020-09-25 NOTE — Progress Notes (Signed)
Inpatient Rehabilitation Care Coordinator Discharge Note  The overall goal for the admission was met for:   Discharge location: [place] CAN PROVIDE SUPERVISION LEVEL  Length of Stay: Yes-8 DAYS  Discharge activity level: Yes-SUPERVISION-MOD/I LEVEL  Home/community participation: Yes  Services provided included: MD, RD, PT, OT, SLP, RN, CM, Pharmacy, and SW  Financial Services: Private Insurance: [employer] offered to/list presented to:PT  Follow-up services arranged: Outpatient: [facility]PT 7 OT WILL CALL TO SET UP APPOIINTMENTS, DME: [facility], and Patient/Family has no preference for HH/DME agencies  Comments (or additional information):PT [name]  Patient/Family verbalized understanding of follow-up arrangements: Yes  Individual responsible for coordination of the follow-up plan: TOMMY-BROTHER [phone]  Confirmed correct DME delivered: Elease Hashimoto 09/25/2020    Malik Jones, Gardiner Rhyme

## 2020-09-25 NOTE — Progress Notes (Signed)
Occupational Therapy Discharge Summary  Patient Details  Name: Malik Jones MRN: [id number] Date of Birth: [birth year]  Today's Date: 09/25/2020 OT Individual Time: 1415-1510 OT Individual Time Calculation (min): 55 min    Patient has met 11 of 11 long term goals due to improved activity tolerance, improved balance, postural control, ability to compensate for deficits, functional use of  RIGHT upper and RIGHT lower extremity, and improved coordination.  Patient to discharge at overall Supervision level.  Patient's care partner is independent to provide the necessary physical and cognitive assistance at discharge.    Recommendation:  Patient will benefit from ongoing skilled OT services in outpatient setting to continue to advance functional skills in the area of  RUE motor control and IADLs .  Equipment: RW  Reasons for discharge: treatment goals met and discharge from hospital  Patient/family agrees with progress made and goals achieved: Yes  OT Discharge Precautions/Restrictions  Precautions Precautions: Fall Precaution Comments: RLE ataxia Restrictions Weight Bearing Restrictions: No   Pain Pain Assessment Pain Scale: 0-10 Pain Score: 0-No pain ADL ADL Eating: Set up Where Assessed-Eating: Bed level Grooming: Setup Where Assessed-Grooming: Sitting at sink Upper Body Bathing: Setup Where Assessed-Upper Body Bathing: Shower Lower Body Bathing: Supervision/safety Where Assessed-Lower Body Bathing: Shower Upper Body Dressing: Setup Where Assessed-Upper Body Dressing: Edge of bed Lower Body Dressing: Contact guard Where Assessed-Lower Body Dressing: Chair Toileting: Contact guard Where Assessed-Toileting: Glass blower/designer: Therapist, music Method: Counselling psychologist: Grab bars, Raised toilet seat Social research officer, government: Curator Method: Heritage manager: Midwife Baseline Vision/History: Wears glasses Wears Glasses: Reading only Patient Visual Report: No change from baseline Vision Assessment?: No apparent visual deficits Perception  Perception: Within Functional Limits Praxis Praxis: Intact Cognition Overall Cognitive Status: Within Functional Limits for tasks assessed Arousal/Alertness: Awake/alert Attention: Sustained;Selective Sustained Attention: Appears intact Selective Attention: Appears intact Memory: Appears intact Immediate Memory Recall: Sock;Blue;Bed Memory Recall Sock: Without Cue Memory Recall Blue: Without Cue Memory Recall Bed: Without Cue Awareness: Appears intact Problem Solving: Appears intact Safety/Judgment: Appears intact Sensation Sensation Light Touch: Impaired by gross assessment Hot/Cold: Appears Intact Proprioception: Appears Intact Stereognosis: Not tested Additional Comments: Pt reporting decreased sensation on Left side of body ("dull" compared to right). Able to locate stimulus with light touch testing Coordination Gross Motor Movements are Fluid and Coordinated: No Fine Motor Movements are Fluid and Coordinated: No Finger Nose Finger Test: moderate dysmetria right Heel Shin Test: Imparied coordination on RLE ataxic movement pattern 9 Hole Peg Test: R = 32 sec ; box and blocks:   R = 34 Motor  Motor Motor: Ataxia;Hemiplegia Motor - Discharge Observations: Ataxia > weakness. Truncal and R hemibody affected with ataxia Mobility  Transfers Sit to Stand: Independent with assistive device Stand to Sit: Independent with assistive device  Trunk/Postural Assessment  Cervical Assessment Cervical Assessment: Within Functional Limits Thoracic Assessment Thoracic Assessment: Within Functional Limits Lumbar Assessment Lumbar Assessment: Within Functional Limits Postural Control Postural Control: Within Functional Limits Righting Reactions: Truncal and RLE ataxia impacting   Balance Balance Balance Assessed: Yes Standardized Balance Assessment Standardized Balance Assessment: Berg Balance Test;Timed Up and Go Test Berg Balance Test Sit to Stand: Able to stand without using hands and stabilize independently Standing Unsupported: Able to stand safely 2 minutes Sitting with Back Unsupported but Feet Supported on Floor or Stool: Able to sit safely and securely 2 minutes Stand to Sit: Sits safely with minimal use of hands Transfers: Able  to transfer safely, minor use of hands Standing Unsupported with Eyes Closed: Able to stand 10 seconds safely Standing Ubsupported with Feet Together: Able to place feet together independently and stand 1 minute safely From Standing, Reach Forward with Outstretched Arm: Can reach forward >12 cm safely (5") From Standing Position, Pick up Object from Floor: Able to pick up shoe safely and easily From Standing Position, Turn to Look Behind Over each Shoulder: Looks behind from both sides and weight shifts well Turn 360 Degrees: Able to turn 360 degrees safely but slowly Standing Unsupported, Alternately Place Feet on Step/Stool: Able to complete >2 steps/needs minimal assist Standing Unsupported, One Foot in Front: Able to plae foot ahead of the other independently and hold 30 seconds Standing on One Leg: Able to lift leg independently and hold 5-10 seconds Total Score: 48 Timed Up and Go Test TUG: Normal TUG Normal TUG (seconds): 13.3 (13.3 without RW. 18.8 with RW. minA needed without RW, CGA needed wtih RW) Static Sitting Balance Static Sitting - Balance Support: Feet supported Static Sitting - Level of Assistance: 7: Independent Dynamic Sitting Balance Dynamic Sitting - Balance Support: During functional activity Dynamic Sitting - Level of Assistance: 5: Stand by assistance Static Standing Balance Static Standing - Balance Support: During functional activity Static Standing - Level of Assistance: 6: Modified independent  (Device/Increase time) Dynamic Standing Balance Dynamic Standing - Balance Support: During functional activity Dynamic Standing - Level of Assistance: 5: Stand by assistance Extremity/Trunk Assessment RUE Assessment RUE Assessment: Within Functional Limits LUE Assessment LUE Assessment: Within Functional Limits   Malik Jones 09/25/2020, 4:15 PM

## 2020-09-25 NOTE — Progress Notes (Signed)
Speech Language Pathology Discharge Summary  Patient Details  Name: Malik Jones MRN: [id number] Date of Birth: 10-May-1960  Today's Date: 09/25/2020 SLP Individual Time: 1969-4098 SLP Individual Time Calculation (min): 45 min   Skilled Therapeutic Interventions:  Patient seen for skilled ST session focusing on cognitive and speech goals. Patient intelligible at conversational level when context is known and/or when able to see patient's face when talking. He demonstrated good problem solving, anticipatory awareness and ability to recall and describe therapeutic interventions.      Patient has met 2 of 3 long term goals.  Patient to discharge at overall Supervision;Modified Independent level.  Reasons goals not met: patient is approaching mod I for speech production but inconsistently requiring supervision level cues.   Clinical Impression/Discharge Summary: Patient has made good progress and met 2/3 LTG's. He is approaching goals of modified independent with speech intelligibility and is expected to continue to improve without need for addition skilled SLP intevention. Patient demosntrates good awareness, safety, problem solving and memory.  Care Partner:  Caregiver Able to Provide Assistance: Yes  Type of Caregiver Assistance: Physical;Cognitive  Recommendation:  None      Equipment: none for ST   Reasons for discharge: Treatment goals met;Discharged from hospital   Patient/Family Agrees with Progress Made and Goals Achieved: Yes    Sonia Baller, MA, CCC-SLP Speech Therapy

## 2020-09-25 NOTE — Progress Notes (Signed)
Physical Therapy Session Note  Patient Details  Name: Rayquan Viau MRN: [id number] Date of Birth: 03/06/1960  Today's Date: 09/25/2020 PT Individual Time: 1000-1030 + 1300-1358  PT Individual Time Calculation (min): 30 min + 58 min  Short Term Goals: Week 1:  PT Short Term Goal 1 (Week 1): STG = LTG due to ELOS  Skilled Therapeutic Interventions/Progress Updates:     1st session: Pt supine in bed to start session - agreeable to PT and reports no pain. Eager to DC and pleased to hear his DC date has been moved up by 1 day. Confirms 24/7 S/A from his brother. Supine<>sit mod I. Don's shoes with seutpA. Sit<>stand to RW with mod I and ambulates from his room to day room rehab gym with close supervision and RW. Ambulatory transfer to [facility] - completes 3 minutes in seated position at workload 30cm/sec resistance and then 3 minutes in standing position at workload at 10cm/sec resistance. X1 sitting rest break needed during standing kinetron activity 2/2 fatigue. Working on BLE coordination, Optician, dispensing with [employer]. Ambulated back to his room with supervision and RW - good safety awareness with turns by stopping to turn. Stand>sit to EOB mod I with RW. Remained sitting OEB at end of session with all needs in reach and bed alarm on. Awaiting for upcoming SLP session.   2nd session: Pt supine in bed to start session and agreeable to therapy. Excited for tomorrow's discharge home. Bed mobility completed mod I. Donn's tennis shoes with setupA and able to tie them without assist. Sit<>stand mod I to RW. Ambulates to main rehab gym, ~156f, with supervision and RW. Completed BERG balance test, scored 48/56 (scored 33/56 on admission). Score indicating increased falls risk but significant improvement since prior testing. See details below. Also completed TUG with vs without RW.   TUG w/ RW (required CGA for quick turns around cone) 1) 22.44 seconds 2) 18.5 seconds 3) 15.5 seconds AVG =  18.8 seconds  TUG w/ out RW (required minA for balance) 1) 14.5 seconds 2) 12.5 seconds 3) 13 seconds AVG = 13.3 seconds  Pt ambulated with supervision and RW to ortho rehab gym, ~1756f Completed car transfer with supervision and RW - demonstrated great safety awareness during transfer and understanding safety approach. Ambulated with supervision and RW to day room rehab gym, ~[id number] Nustep at workload 5, using BLE and BUE, for x6 minutes, working on reciprocal movement pattern and global strengthening/endurance training. Ambulated back to his room with supervision and RW and ended session seated EOB with bed alarm set and all needs within reach.  Therapy Documentation Precautions:  Precautions Precautions: Fall Precaution Comments: RLE ataxia Restrictions Weight Bearing Restrictions: No General:  Balance Balance Assessed: Yes Standardized Balance Assessment Standardized Balance Assessment: Berg Balance Test Berg Balance Test Sit to Stand: Able to stand without using hands and stabilize independently Standing Unsupported: Able to stand safely 2 minutes Sitting with Back Unsupported but Feet Supported on Floor or Stool: Able to sit safely and securely 2 minutes Stand to Sit: Sits safely with minimal use of hands Transfers: Able to transfer safely, minor use of hands Standing Unsupported with Eyes Closed: Able to stand 10 seconds safely Standing Ubsupported with Feet Together: Able to place feet together independently and stand 1 minute safely From Standing, Reach Forward with Outstretched Arm: Can reach forward >12 cm safely (5") From Standing Position, Pick up Object from Floor: Able to pick up shoe safely and easily From Standing Position, Turn to Look  Behind Over each Shoulder: Looks behind from both sides and weight shifts well Turn 360 Degrees: Able to turn 360 degrees safely but slowly Standing Unsupported, Alternately Place Feet on Step/Stool: Able to complete >2  steps/needs minimal assist Standing Unsupported, One Foot in Front: Able to plae foot ahead of the other independently and hold 30 seconds Standing on One Leg: Able to lift leg independently and hold 5-10 seconds Total Score: 48/56    Therapy/Group: Individual Therapy  Alger Simons PT, DPT 09/25/2020, 7:49 AM

## 2020-09-26 NOTE — Progress Notes (Signed)
PA Dan in with pt/family to discuss discharge instructions. Pt/family in agreement. Questions answered appropriately. Belongings gathered and returned to pt. Sheela Stack, LPN

## 2020-09-26 NOTE — Progress Notes (Signed)
PROGRESS NOTE   Subjective/Complaints: Slept poorly again last night due to loud neighboring patients. Looking forward to going home today. Continues to have right sided weakness- completed his disability forms for him today- unable to do his current work of past 19 years due to his dominant hand weakness.   ROS: Denies CP, SOB, N/V/D, +right upper extremity weakness.   Objective:   No results found. No results for input(s): WBC, HGB, HCT, PLT in the last 72 hours.  Recent Labs    09/25/20 0507  NA 133*  K 4.3  CL 98  CO2 26  GLUCOSE 107*  BUN 21*  CREATININE 1.15  CALCIUM 10.0    Intake/Output Summary (Last 24 hours) at 09/26/2020 1038 Last data filed at 09/26/2020 0900 Gross per 24 hour  Intake 720 ml  Output 900 ml  Net -180 ml        Physical Exam: Vital Signs Blood pressure (!) 142/98, pulse 71, temperature 98.5 F (36.9 C), temperature source Oral, resp. rate 16, height '5\' 6"'$  (1.676 m), weight 64.4 kg, SpO2 99 %. Gen: no distress, normal appearing HEENT: oral mucosa pink and moist, NCAT Cardio: Reg rate Chest: normal effort, normal rate of breathing Abd: soft, non-distended Ext: no edema Psych: pleasant, normal affect Skin: intact Musc: No edema in extremities.  No tenderness in extremities. Neuro: Alert, higher level cognitive deficits Mild dysarthria, improving RUE: 4+/5 proximal to distal with atxtia, stable LUE/LLE: 5/5 proximal to distal   Assessment/Plan: 1. Functional deficits which require 3+ hours per day of interdisciplinary therapy in a comprehensive inpatient rehab setting. Physiatrist is providing close team supervision and 24 hour management of active medical problems listed below. Physiatrist and rehab team continue to assess barriers to discharge/monitor patient progress toward functional and medical goals  Care Tool:  Bathing    Body parts bathed by patient: Right arm, Left  arm, Chest, Abdomen, Front perineal area, Buttocks, Right upper leg, Left upper leg, Right lower leg, Left lower leg, Face         Bathing assist Assist Level: Set up assist     Upper Body Dressing/Undressing Upper body dressing   What is the patient wearing?: Pull over shirt    Upper body assist Assist Level: Independent    Lower Body Dressing/Undressing Lower body dressing      What is the patient wearing?: Pants     Lower body assist Assist for lower body dressing: Set up assist     Toileting Toileting    Toileting assist Assist for toileting: Independent with assistive device Assistive Device Comment: urinal   Transfers Chair/bed transfer  Transfers assist     Chair/bed transfer assist level: Supervision/Verbal cueing     Locomotion Ambulation   Ambulation assist      Assist level: Supervision/Verbal cueing Assistive device: Walker-rolling Max distance: 250   Walk 10 feet activity   Assist     Assist level: Supervision/Verbal cueing Assistive device: Walker-rolling   Walk 50 feet activity   Assist    Assist level: Supervision/Verbal cueing Assistive device: Walker-rolling    Walk 150 feet activity   Assist    Assist level: Supervision/Verbal cueing Assistive device:  Walker-rolling    Walk 10 feet on uneven surface  activity   Assist     Assist level: Contact Guard/Touching assist Assistive device: Aeronautical engineer Will patient use wheelchair at discharge?: No      Wheelchair assist level: Contact Guard/Touching assist      Wheelchair 50 feet with 2 turns activity    Assist        Assist Level: Contact Guard/Touching assist   Wheelchair 150 feet activity     Assist      Assist Level: Contact Guard/Touching assist   Blood pressure (!) 142/98, pulse 71, temperature 98.5 F (36.9 C), temperature source Oral, resp. rate 16, height '5\' 6"'$  (1.676 m), weight 64.4 kg, SpO2 99  %.  Medical Problem List and Plan: 1.  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  D/c home today 2.  Antithrombotics: -DVT/anticoagulation:  Pharmaceutical: Other (comment)--Eliquis             -antiplatelet therapy: N/A 3. Pain Management: Tylenol prn             Monitor with increased ecterntion. 4. Mood: LCSW to follow for evaluation and support.              -antipsychotic agents: N/a 5. Neuropsych: This patient is capable of making decisions on his own behalf. 6. Skin/Wound Care: Routine pressure relief measures.  7. Fluids/Electrolytes/Nutrition: Monitor I/Os.    8.  HTN: Monitor blood pressures 3 times daily.   --Continue amlodipine and HCTZ, increase HCTZ to 37.'5mg'$ .   -Magnesium and potassium levels reviewed and stable.             Monitor with increased mobility.  9.  New onset T2DM: Hemoglobin A1c 6.5.  RD consult to educate patient on CM diet.  --d/c ISS --d/c Glucerna Monitor with increased activity Mild elevated on 8/17- d/c CBGs. Provided dietary education.  10. Transaminitis: discontinue Tylenol   ALT elevated on 8/12. Repeat outpatient.  11. Vitamin D deficiency: continue ergocalciferol 50,000U once per week for 7 weeks 13. Hyponatremia: Na 133- monitor outpatient   >30 minutes spent in discharge of patient including review of medications and follow-up appointments, physical examination, and in answering all patient's questions     LOS: 8 days A FACE TO FACE EVALUATION WAS [place] 09/26/2020, 10:38 AM

## 2020-10-01 NOTE — Discharge Summary (Signed)
Physician Discharge Summary  Patient ID: Malik Jones MRN: [id number] DOB/AGE: 1960/12/18 60 y.o.  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[facility]) Active Problems:   Essential hypertension   Transaminitis   Diabetes mellitus, new onset ([place])   Hyponatremia   Vitamin D deficiency   Mediastinal adenopathy   Discharged Condition: stable  Significant Diagnostic Studies: N/A   Labs:  Basic Metabolic Panel: BMP Latest Ref Rng & Units 09/25/2020 09/20/2020 09/19/2020  Glucose 70 - 99 mg/dL 107(H) 118(H) 122(H)  BUN 6 - 20 mg/dL 21(H) 21(H) 21(H)  Creatinine 0.61 - 1.24 mg/dL 1.15 1.21 1.18  Sodium 135 - 145 mmol/L 133(L) 134(L) 134(L)  Potassium 3.5 - 5.1 mmol/L 4.3 4.7 4.6  Chloride 98 - 111 mmol/L 98 95(L) 99  CO2 22 - 32 mmol/L 26 27 24   Calcium 8.9 - 10.3 mg/dL 10.0 10.8(H) 10.3     CBC: CBC Latest Ref Rng & Units 09/19/2020 09/18/2020 09/17/2020  WBC 4.0 - 10.5 K/uL 5.1 4.5 4.4  Hemoglobin 13.0 - 17.0 g/dL 15.9 15.0 14.1  Hematocrit 39.0 - 52.0 % 47.4 44.7 42.4  Platelets 150 - 400 K/uL 366 355 318     LFTs:  Hepatic Function Latest Ref Rng & Units 09/20/2020 09/19/2020  Total Protein 6.5 - 8.1 g/dL 8.7(H) 8.1  Albumin 3.5 - 5.0 g/dL 4.5 4.1  AST 15 - 41 U/L 41 51(H)  ALT 0 - 44 U/L 91(H) 100(H)  Alk Phosphatase 38 - 126 U/L 51 48  Total Bilirubin 0.3 - 1.2 mg/dL 0.6 0.6     CBG: Recent Labs  Lab 09/24/20 2141 09/25/20 0607 09/25/20 1112  GLUCAP 99 107* 102*    Brief HPI:   Malik Jones is a 60 y.o. male who was admitted to [facility] on 09/12/2020 with dizziness, right-sided ataxia and numbness LUE and LLE with falls.  CT of head showed acute/subacute nonhemorrhagic right superior cerebellar infarct.  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 Leonie Man felt that stroke was due to embolization of aortic thrombus.  AC recommended for at least 3 to 6 months and respiratory status monitored as patient without signs of pneumonia.  Dr. Stanford Breed was consulted for input on anticoagulation and as patient is showing neurological recovery, he recommended nonurgent cardiac surgery evaluation in the future.  Dr. Roxan Hockey was consulted for input and recommended repeat CTA chest which was done showing that previously identified adherent thrombus was no longer visible.  He recommended repeat CT chest 2 to 3 months to monitor for resolution of mediastinal adenopathy likely infectious in nature.  Patient with resultant right-sided hemiparesis with ataxia, left sensory deficits as well as deficits in higher level cognitive tasks.  CIR was recommended due to functional decline.   Hospital Course: Kyrillos Adams was admitted to rehab 09/18/2020 for inpatient therapies to consist of PT, ST and OT at least three hours five days a week. Past admission physiatrist, therapy team and rehab RN have worked together to provide customized collaborative inpatient rehab.  He was maintained on Eliquis during his stay and is tolerating this without side effects.  Follow-up CBC showed H&H and platelets to be stable.  He  was found to have abnormal LFTs at admission and this is slowly improving.  Recommend repeat check of electrolytes and LFTs in the next couple weeks after discharge.  His blood pressures were monitored on TID basis and and is currently controlled on amlodipine and HCTZ.  Mild hyponatremia and prerenal azotemia noted  serial check of electrolytes and is stable.  Vitamin D levels were noted to be low at 20.97 therefore he was started on weekly supplement. He has been educated on CM diet due to newly diagnosed  DM and his BS were monitored with ac/hs CBG checks.  SSI was use prn and BS were controlled on diet alone.  He has made good gains but continues to be limited by right-sided weakness with ataxia.  Supervision is recommended for safety at discharge.  He will continue to receive follow-up outpatient PT and OT at [facility] after discharge.     Rehab course: During patient's stay in rehab weekly team conference was held to monitor patient's progress, set goals and discuss barriers to discharge. At admission, patient required min assist with mobility and ADL tsks. He exhibited mild cognitive deficits with mild to moderate dysarthria.  He has had improvement in activity tolerance, balance, postural control as well as ability to compensate for deficits. He is able to complete ADL task with set up for upper body care and supervision to [facility] for LB care. He requires supervision  and RW for transfers and mobility. Speech clarity has improved and requires occasional supervisory cues speech intelligibility. Family education has been completed.    Discharge disposition: 01-Home or Self Care  Diet:  Heart healthy/Carb modified.   Special Instructions: Recommend repeat CMET to monitor sodium and LFTs after discharge.  2.  No driving till cleared by MD.  3.  Recommend repeat CT chest in 2-3 months to monitor for resolution of adenopathy.    Discharge Instructions     Ambulatory referral to Neurology   Complete by: As directed    An appointment is requested in approximately: 4 weeks right superior cerebellar infarction   Ambulatory referral to Physical Medicine Rehab   Complete by: As directed    Moderate complexity follow-up 1 to 2 weeks right superior cerebellar artery infarction      Allergies as of 09/26/2020   No Known Allergies      Medication List     STOP taking these medications    ibuprofen 200 MG tablet Commonly known as: ADVIL   metFORMIN 500 MG 24 hr tablet Commonly known as: Glucophage  XR       TAKE these medications    acetaminophen 325 MG tablet Commonly known as: TYLENOL Take 1-2 tablets (325-650 mg total) by mouth every 4 (four) hours as needed for mild pain.   amLODipine 10 MG tablet Commonly known as: NORVASC Take 1 tablet (10 mg total) by mouth daily.   apixaban 5 MG Tabs tablet Commonly known as: ELIQUIS Take 1 tablet (5 mg total) by mouth 2 (two) times daily.   atorvastatin 40 MG tablet Commonly known as: LIPITOR Take 1 tablet (40 mg total) by mouth daily.   hydrochlorothiazide 12.5 MG capsule Commonly known as: MICROZIDE Take 3 capsules (37.5 mg total) by mouth daily. What changed: how much to take   multivitamin with minerals Tabs tablet Take 1 tablet by mouth daily.   Vitamin D (Ergocalciferol) 1.25 MG (50000 UNIT) Caps capsule Commonly known as: DRISDOL Take 1 capsule (50,000 Units total) by mouth every 7 (seven) days.  Follow-up Information     Raulkar, Clide Deutscher, MD Follow up.   Specialty: Physical Medicine and Rehabilitation Why: 11/12/20 please arrive at 9:00am for 9:20am appointment Contact information: [address] [place] [address] [phone]         Cherre Robins, MD Follow up.   Specialties: Vascular Surgery, Interventional Cardiology Why: Call for appointment for ongoing evaluation and monitoring of ascending aortic thrombus Contact information: [place] [address] [place], [place] Follow up.   Contact information: [address] [place] [address] [phone]                 Signed: Bary Leriche 10/04/2020, 6:10 PM

## 2020-10-04 DIAGNOSIS — R590 Localized enlarged lymph nodes: Secondary | ICD-10-CM

## 2020-10-04 DIAGNOSIS — E559 Vitamin D deficiency, unspecified: Secondary | ICD-10-CM

## 2020-10-08 ENCOUNTER — Ambulatory Visit: Payer: BC Managed Care – PPO

## 2020-10-08 ENCOUNTER — Ambulatory Visit: Payer: BC Managed Care – PPO | Attending: Physical Medicine and Rehabilitation | Admitting: Physical Therapy

## 2020-10-08 ENCOUNTER — Other Ambulatory Visit: Payer: Self-pay | Admitting: Physical Medicine and Rehabilitation

## 2020-10-08 ENCOUNTER — Other Ambulatory Visit: Payer: Self-pay

## 2020-10-08 DIAGNOSIS — R2689 Other abnormalities of gait and mobility: Secondary | ICD-10-CM | POA: Insufficient documentation

## 2020-10-08 DIAGNOSIS — M6281 Muscle weakness (generalized): Secondary | ICD-10-CM | POA: Insufficient documentation

## 2020-10-08 DIAGNOSIS — R278 Other lack of coordination: Secondary | ICD-10-CM | POA: Diagnosis not present

## 2020-10-08 DIAGNOSIS — I639 Cerebral infarction, unspecified: Secondary | ICD-10-CM | POA: Insufficient documentation

## 2020-10-08 DIAGNOSIS — R2681 Unsteadiness on feet: Secondary | ICD-10-CM | POA: Insufficient documentation

## 2020-10-08 DIAGNOSIS — R262 Difficulty in walking, not elsewhere classified: Secondary | ICD-10-CM | POA: Diagnosis not present

## 2020-10-08 MED ORDER — HYDROCHLOROTHIAZIDE 12.5 MG PO CAPS: 37.5000 mg | ORAL_CAPSULE | Freq: Every day | ORAL | 0 refills | Status: DC

## 2020-10-08 MED ORDER — AMLODIPINE BESYLATE 10 MG PO TABS: 10.0000 mg | ORAL_TABLET | Freq: Every day | ORAL | 0 refills | Status: DC

## 2020-10-08 NOTE — Therapy (Signed)
[place] MAIN [facility] SERVICES [address] [place], [place], [address] Phone: [phone]   Fax:  [phone]  Physical Therapy Evaluation  Patient Details  Name: Malik Jones MRN: [id number] Date of Birth: 04/10/1960 No data recorded  Encounter Date: 10/08/2020   PT End of Session - 10/08/20 1010     Visit Number 1    Number of Visits 24    Date for PT Re-Evaluation 12/31/20    PT Start Time 0915    PT Stop Time 1015    PT Time Calculation (min) 60 min    Equipment Utilized During Treatment Gait belt    Activity Tolerance Patient tolerated treatment well    Behavior During Therapy WFL for tasks assessed/performed             Past Medical History:  Diagnosis Date   Medical history non-contributory     Past Surgical History:  Procedure Laterality Date   MANDIBLE FRACTURE SURGERY     NO PAST SURGERIES      There were no vitals filed for this visit.    Subjective Assessment - 10/08/20 1631     Subjective Chief complaint: Acute/subacute large right cerebellar infarct    Pertinent History Pt is a 60 y/o male s/p acute/subacute large right cerebellar infarct with secondary right sided hemiparesis and remote left superior cerebellar infarct with left-sided numbness. Infarct occured on 09/11/20; no significant PMH. He attended inpatient rehab services at [facility] for 2 weeks and d/c from inpatient 10 days ago. He lived with his brother for 1 week before moving back into his own home. He is now living independently with abundant family check-ins. He is currently not driving or employed. He states his employer has had to post his job during this time. Pt reports 1 fall immediately after stroke, otherwise no history of falls. Only PT he has recieved has been post-infarct. Pt arrivied with RW however states he rarely uses it and does nto use it in the home.    Limitations House hold activities;Walking;Lifting;Writing    How long can  you sit comfortably? unaffected    How long can you stand comfortably? states standing is not a problem    How long can you walk comfortably? states walking is not a problem    Patient Stated Goals return to work and driving, be fully independent as he was before cerebellar infarct    Currently in Pain? No/denies           SUBJECTIVE Chief complaint: Acute/subacute large right cerebellar infarct with secondary right sided hemiparesis; remote left superior cerebellar infarct with left-sided numbness.  Onset: 09/11/20 Recent changes in overall health/medication: none (besides this incident) PMH: none significant  History for falls: none Prior history of physical therapy: only inpatient and acute therapy s/p infarct  Follow-up appointment with MD: w/ neurologist 10/4 Social/living: employment in [place] - pt reports job is not too physical however he remained active throughout the day. He has not yet returned to work or driving. (Employer had to post his job.) He is now living alone after staying at his brother's house for 1 week. He does have abundant family in the area who check in on him and ensure he is taken care of/has groceries/etc.  Red flags (bowel/bladder changes, saddle paresthesia, personal history of cancer, chills/fever, night sweats, unrelenting pain): Negative  Goals: return to work and driving, be fully independent as he was before cerebellar infarct  OBJECTIVE  MUSCULOSKELETAL: Tremor:  Absent Bulk: Normal Tone: Normal, no clonus  Posture No gross abnormalities noted in standing or seated posture  Gait Decreased RLE foot clearance with decreased R hip and knee flexion. Diminished arm swing bilaterally. Decreased rotation at hips and trunk. Observed with no AD.  Mild instability, occasional anterior LOB with CGA to steady due to decreased R foot clearance. Increased frequency with fatigue and dual tasking (conversation with ambulation). Increased  instability and time to complete stand pivot turns, especially when turning to the left.   Functional Mobility Independent with bed mobility (rolling, supine<>sit) and STS transfer. CGA with stand pivot.   Strength R/L 5/5 Hip flexion 5/5 Hip external rotation 5/5 Hip internal rotation 5/5 Hip extension  5/5 Hip abduction 5/5 Hip adduction 5/5 Knee extension 5/5 Knee flexion 5/5 Ankle Plantarflexion 5/5 Ankle Dorsiflexion   NEUROLOGICAL:  Mental Status Patient is oriented to person, place and time.  Attention span and concentration are intact.  Expressive speech is intact.  Patient's fund of knowledge is within normal limits for education level.   Sensation RUE/RLE grossly intact to light touch as determined by testing dermatomes C2-T2/L2-S2 respectively. Impaired light touch and deep pressure sensation to LUE and LLE; pt reports numbness and tingling to entire L side of his body. Hot/cold testing performed - accurate on BUE and RLE; 1 inaccurate response to cold on LLE in L4 dermatome.   Coordination/Cerebellar Finger to Nose: decreased speed speed, coordination and accuracy RUE Heel to Shin: decreased speed speed, coordination and accuracy RLE Rapid alternating movements: mild lag in RUE compared to LUE Finger Opposition: decreased speed speed, coordination and accuracy RUE Pronator Drift: Negative   FUNCTIONAL OUTCOME MEASURES   Results Comments  BERG /56 *please assess next session  TUG 12.68 seconds   5TSTS 12.08 seconds   6 Minute Walk Test 900 feet No AD. Decreased RLE foot clearance, especially with fatigue. CGA for safety. Increased difficulty turning L compared to R.   10 Meter Gait Speed Self-selected: 14.31s = m/s; Fastest: 9.69s = m/s No AD. Below normative values for full community ambulation      FOTO 63 Goal: 77     ASSESSMENT Clinical Impression: Pt is a pleasant 60 year-old male referred for continued therapy after d/c from Inpatient Therapy.  Dx: acute/subacute large right cerebellar infarct with secondary right sided hemiparesis and remote left superior cerebellar infarct with left-sided numbness on 09/11/20. No significant PMH. PT examination reveals grossly 5/5 strength in BLE. Deficits in right-sided coordination, gait speed, gait mechanics, functional endurance and dynamic standing stability. He also demonstrates left-sided sensory deficits. Pt will benefit from skilled PT services to address deficits in gait, coordination and balance for improved quality of life, to return to work/driving and decrease risk for future falls.    PLAN Next Visit: please assess BERG, FGA, stairs HEP: please provide in second session  Objective measurements completed on examination: See above findings.      PT Short Term Goals - 10/08/20 1713       PT SHORT TERM GOAL #1   Title Patient will be independent in home exercise program to improve strength/mobility for better functional independence with ADLs.    Time 6    Period Weeks    Status New    Target Date 11/19/20               PT Long Term Goals - 10/08/20 1722       PT LONG TERM GOAL #1   Title Patient will  increase FOTO score to equal to or greater than 77 to demonstrate statistically significant improvement in mobility and quality of life.    Baseline 10/08/20: 63    Time 12    Period Weeks    Status New    Target Date 12/31/20      PT LONG TERM GOAL #2   Title Pt will improve BERG by at least 3 points in order to demonstrate clinically significant improvement in balance.    Baseline please assess    Period Weeks    Status New    Target Date 12/31/20      PT LONG TERM GOAL #3   Title Patient will reduce timed up and go to <11 seconds to reduce fall risk and demonstrate improved transfer/gait ability.    Baseline 10/08/20: 12.68 seconds;    Time 12    Period Weeks    Status New    Target Date 12/31/20      PT LONG TERM GOAL #4   Title Patient will increase six  minute walk test distance to >1000 for progression to community ambulator and improve gait ability    Baseline 10/08/20: 944f    Time 12    Period Weeks    Status New    Target Date 12/31/20      PT LONG TERM GOAL #5   Title Patient will increase Functional Gait Assessment score to >20/30 as to reduce fall risk and improve dynamic gait safety with community ambulation.    Time 12    Period Weeks    Status New    Target Date 12/31/20                    Plan - 10/08/20 1646     Clinical Impression Statement Pt is a pleasant 60year-old male referred for continued therapy after d/c from Inpatient Therapy. Dx: acute/subacute large right cerebellar infarct with secondary right sided hemiparesis and remote left superior cerebellar infarct with left-sided numbness on 09/11/20. No significant PMH. PT examination reveals grossly 5/5 strength in BLE. Deficits in right-sided coordination, gait speed, gait mechanics, functional endurance and dynamic standing stability. He also demonstrates left-sided sensory deficits. Pt will benefit from skilled PT services to address deficits in gait, coordination and balance for improved quality of life, to return to work/driving and decrease risk for future falls.    Examination-Activity Limitations Locomotion Level;Transfers;Stairs    Examination-Participation Restrictions Driving;Occupation;Community Activity;Yard Work    Stability/Clinical Decision Making Stable/Uncomplicated    CDesigner, jewelleryLow    Rehab Potential Excellent    PT Frequency 2x / week    PT Duration 12 weeks    PT Treatment/Interventions ADLs/Self Care Home Management;Aquatic Therapy;Biofeedback;Cryotherapy;Electrical Stimulation;Moist Heat;Traction;Ultrasound;Gait training;DME Instruction;Stair training;Functional mobility training;Therapeutic activities;Neuromuscular re-education;Balance training;Therapeutic exercise;Patient/family education;Manual techniques    PT Next  Visit Plan please assess BERG, FGA, stairs    PT Home Exercise Plan please provide in second session    Consulted and Agree with Plan of Care Patient             Patient will benefit from skilled therapeutic intervention in order to improve the following deficits and impairments:  Abnormal gait, Decreased activity tolerance, Decreased endurance, Impaired sensation, Improper body mechanics, Decreased balance, Decreased coordination, Decreased mobility, Decreased safety awareness, Difficulty walking  Visit Diagnosis: Cerebellar stroke ([facility])  Other abnormalities of gait and mobility  Other lack of coordination  Difficulty in walking, not elsewhere classified  Unsteadiness on feet  Problem List Patient Active Problem List   Diagnosis Date Noted   Vitamin D deficiency 10/04/2020   Mediastinal adenopathy 10/04/2020   Hyponatremia    Transaminitis    Diabetes mellitus, new onset ([place])    Malnutrition of moderate degree 09/13/2020   Stroke ([place]) 09/12/2020   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

## 2020-10-09 NOTE — Therapy (Signed)
[place] MAIN [facility] SERVICES [address] [place], [place], [address] Phone: [phone]   Fax:  [phone]  Occupational Therapy Evaluation  Patient Details  Name: Malik Jones MRN: [id number] Date of Birth: 30-May-1960 Referring Provider (OT): Dr. Leeroy Cha   Encounter Date: 10/08/2020   OT End of Session - 10/09/20 0835     Visit Number 1    Number of Visits 24    Date for OT Re-Evaluation 12/30/20    Authorization Time Period Reporting period starting 10/08/2020    OT Start Time 1014    OT Stop Time 1103    OT Time Calculation (min) 49 min    Activity Tolerance Patient tolerated treatment well    Behavior During Therapy [facility] for tasks assessed/performed             Past Medical History:  Diagnosis Date   Medical history non-contributory     Past Surgical History:  Procedure Laterality Date   MANDIBLE FRACTURE SURGERY     NO PAST SURGERIES      There were no vitals filed for this visit.   Subjective Assessment - 10/09/20 0820     Subjective  "I'm mostly using my L hand because my R one shakes."    Pertinent History cerebellar stroke    Limitations balance, R sided weakness and ataxia    Patient Stated Goals "I want to get back to normal."    Currently in Pain? No/denies    Pain Score 0-No pain               [facility] OT Assessment - 10/09/20 0001       Assessment   Medical Diagnosis R cerebellar infarct    Referring Provider (OT) Dr. 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      ADL   Eating/Feeding Modified independent   eating with L non-dominant hand   Grooming Modified independent   shaving and brushing teeth with L non-dominant hand   Upper Body Bathing Independent    Upper Body Dressing Independent    ADL comments Extra time to complete UB ADLs as pt is having to use L non-dominant arm      Mobility   Mobility Status Comments Pt walks without AD at home and brings RW into community; used transport chair to get from hospital entrance to therapy clinic downstairs d/t fatigue with mobility      Written Expression   Dominant Hand Right      Vision - History   Baseline Vision  No visual deficits      Vision Assessment   Ocular Range of Motion Within Functional Limits    Tracking/Visual Pursuits Able to track stimulus in all quads without difficulty    Saccades Within functional limits    Visual Fields No apparent deficits      Activity Tolerance   Activity Tolerance Tolerates 30 min activity with multiple rests      Cognition   Overall Cognitive Status Within Functional Limits for tasks assessed      Observation/Other Assessments   Focus on Therapeutic Outcomes (FOTO)  52      Sensation   Light Touch Impaired by gross assessment   LUE tingling/numbness, RUE WNL     Coordination   Gross Motor Movements are Fluid and Coordinated No    Fine Motor Movements are Fluid and Coordinated No    Coordination and Movement Description RUE is moderately ataxic    Finger Nose Finger Test impaired RUE    Right 9 Hole Peg Test 56.3 sec    Left 9 Hole Peg Test 20.6 sec      AROM   Overall AROM Comments BUEs WNL      Strength   Overall Strength Comments LUE 5/5, R  shoulder 4+/5, R elbow 5/5 (distal RUE impaired, see below)      Hand Function   Right Hand Grip (lbs) 65 lbs    Right Hand Lateral Pinch 22 lbs    Right Hand 3 Point Pinch 18 lbs    Left Hand Grip (lbs) 76 lbs    Left Hand Lateral Pinch 22 lbs    Left 3 point pinch 19 lbs           Occupational Therapy Evaluation: Pt is a 60 y/o male who presents today with mild weakness in distal R dominant arm, moderate-severe ataxia throughout RUE, and LUE tingling/numbness following cerebellar infarct.  CVA was 09/11/2020, pt was discharged from [facility] to inpatient rehab at [facility], then stayed with family until his return home on Sunday night.  Pt reports doing well at home over the last day and a half, but feels weak and is having to use L non-dominant arm to perform UB ADLs d/t R sided weakness and ataxia.  Pt is eager to return to work and get back to "normal."  Multiple family members live close by and have been supportive in assisting pt with meals and transportation.  Pt will benefit from continued Occupational Therapy to address deficits noted above in order to maximize indep with ADLs/IADLs/work/leisure.   Neuro re-ed: Issued green theraputty and instructed in strength and coordination exercises for R hand, including gross grasping, pinching, digit abd/add, and digging coins out of putty.  Handout issued with good return demo, min vc for accuracy.     OT Education - 10/09/20 0835     Education Details Theraputty HEP    Person(s) Educated Patient    Methods Explanation;Demonstration;Verbal cues;Handout    Comprehension Verbalized understanding;Returned demonstration;Verbal cues required;Need further instruction              OT Short Term Goals - 10/09/20 0836       OT SHORT TERM GOAL #1   Title Pt will be indep with HEP for RUE strength/coordination    Baseline Eval: HEP initiated at eval, will continue to add on    Time 6    Period Weeks    Status New    Target Date 11/18/20  OT Long Term Goals - 10/09/20 0837       OT LONG TERM GOAL #1   Title Pt will increase FOTO score to 60 or better to indicate increased functional performance.    Baseline Eval: FOTO 52    Time 12    Period Weeks    Status New    Target Date 12/30/20      OT LONG TERM GOAL #2   Title Pt will increase R grip strength to 80 lbs or better to improve carrying heavier ADL supplies with R dominant hand.    Baseline Eval: R grip 65 lbs (L non-dominant 76 lbs)    Time 12    Period Weeks    Status New    Target Date 12/30/20      OT LONG TERM GOAL #3   Title Pt will improve ability to pick up and manipulate small ADL supplies without dropping with R dominant hand as demonstrated by improved 9 hole peg test score to <30 sec or better.    Baseline Eval: R 9 hole peg test 56.3 sec (L non-dominant 20.6 sec)    Time 12    Period Weeks    Status New    Target Date 12/30/20      OT LONG TERM GOAL #4   Title Pt will improve R GMC as demonstrated by ability to brush teeth, shave, eat with R dominant hand.    Baseline Eval: Pt using L non-dominant hand to brush teeth, shave, eat d/t moderate ataxia throughout RUE.    Time 12    Period Weeks    Status New    Target Date 12/30/20                   Plan - 10/09/20 0853     Clinical Impression Statement Pt is a 60 y/o male who presents today with mild weakness in distal R dominant arm, moderate-severe ataxia throughout RUE, and LUE tingling/numbness following cerebellar infarct.  CVA was 09/11/2020, pt was discharged from [facility] to inpatient rehab at [facility], then stayed with family until his return home on Sunday night.  Pt reports doing well at home over the last day and a half, but feels weak and is having to use L non-dominant arm to perform UB ADLs d/t R sided weakness and ataxia.  Pt is eager to return to work and get back to "normal."  Multiple family members live close by and have been supportive in assisting pt  with meals and transportation.  Pt will benefit from continued Occupational Therapy to address deficits noted above in order to maximize indep with ADLs/IADLs/work/leisure.    OT Occupational Profile and History Detailed Assessment- Review of Records and additional review of physical, cognitive, psychosocial history related to current functional performance    Occupational performance deficits (Please refer to evaluation for details): ADL's;IADL's;Leisure    Body Structure / Function / Physical Skills ADL;Dexterity;Strength;Balance;Coordination;FMC;IADL;Endurance;Sensation;UE functional use;GMC    Rehab Potential Excellent    Clinical Decision Making Several treatment options, min-mod task modification necessary    Comorbidities Affecting Occupational Performance: May have comorbidities impacting occupational performance    Modification or Assistance to Complete Evaluation  No modification of tasks or assist necessary to complete eval    OT Frequency 2x / week    OT Duration 12 weeks    OT Treatment/Interventions Self-care/ADL training;Therapeutic exercise;DME and/or AE instruction;Balance training;Neuromuscular education;Energy conservation;Therapeutic activities;Patient/family education;Coping strategies training    OT Home Exercise Plan theraputty issued  and instructed in exercises today with handout given    Consulted and Agree with Plan of Care Patient             Patient will benefit from skilled therapeutic intervention in order to improve the following deficits and impairments:   Body Structure / Function / Physical Skills: ADL, Dexterity, Strength, Balance, Coordination, FMC, IADL, Endurance, Sensation, UE functional use, GMC       Visit Diagnosis: Cerebellar stroke ([place])  Other lack of coordination  Muscle weakness (generalized)    Problem List Patient Active Problem List   Diagnosis Date Noted   Vitamin D deficiency 10/04/2020   Mediastinal adenopathy 10/04/2020    Hyponatremia    Transaminitis    Diabetes mellitus, new onset ([place])    Malnutrition of moderate degree 09/13/2020   Stroke ([place]) 09/12/2020   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

## 2020-10-11 ENCOUNTER — Ambulatory Visit: Payer: BC Managed Care – PPO | Attending: Physical Medicine and Rehabilitation | Admitting: Physical Therapy

## 2020-10-11 ENCOUNTER — Other Ambulatory Visit: Payer: Self-pay

## 2020-10-11 ENCOUNTER — Ambulatory Visit: Payer: BC Managed Care – PPO

## 2020-10-11 DIAGNOSIS — R2681 Unsteadiness on feet: Secondary | ICD-10-CM

## 2020-10-11 DIAGNOSIS — R269 Unspecified abnormalities of gait and mobility: Secondary | ICD-10-CM | POA: Diagnosis not present

## 2020-10-11 DIAGNOSIS — R2689 Other abnormalities of gait and mobility: Secondary | ICD-10-CM

## 2020-10-11 DIAGNOSIS — R278 Other lack of coordination: Secondary | ICD-10-CM | POA: Diagnosis not present

## 2020-10-11 DIAGNOSIS — I639 Cerebral infarction, unspecified: Secondary | ICD-10-CM

## 2020-10-11 DIAGNOSIS — M6281 Muscle weakness (generalized): Secondary | ICD-10-CM | POA: Insufficient documentation

## 2020-10-11 DIAGNOSIS — R262 Difficulty in walking, not elsewhere classified: Secondary | ICD-10-CM | POA: Diagnosis not present

## 2020-10-11 NOTE — Therapy (Signed)
[place] MAIN [facility] SERVICES [address] [place], [place], [address] Phone: [phone]   Fax:  [phone]  Physical Therapy Treatment  Patient Details  Name: Malik Jones MRN: [id number] Date of Birth: 1960-05-02 No data recorded  Encounter Date: 10/11/2020   PT End of Session - 10/11/20 1131     Visit Number 2    Number of Visits 24    Date for PT Re-Evaluation 12/31/20    Progress Note Due on Visit 10    PT Start Time 0855    PT Stop Time 0930    PT Time Calculation (min) 35 min    Equipment Utilized During Treatment Gait belt    Activity Tolerance Patient tolerated treatment well    Behavior During Therapy WFL for tasks assessed/performed             Past Medical History:  Diagnosis Date   Medical history non-contributory     Past Surgical History:  Procedure Laterality Date   MANDIBLE FRACTURE SURGERY     NO PAST SURGERIES      There were no vitals filed for this visit.   Subjective Assessment - 10/11/20 0940     Subjective Pt reports no significant changes since last session. reports continued difficulty wiht UE tasks. Reports he has been attempting to do some walking at home for rehab. Also reports only needing to utilize walker when he is walking outside of his home    Pertinent History Pt is a 60 y/o male s/p acute/subacute large right cerebellar infarct with secondary right sided hemiparesis and remote left superior cerebellar infarct with left-sided numbness. Infarct occured on 09/11/20; no significant PMH. He attended inpatient rehab services at [facility] for 2 weeks and d/c from inpatient 10 days ago. He lived with his brother for 1 week before moving back into his own home. He is now living independently with abundant family check-ins. He is currently not driving or employed. He states his employer has had to post his job during this time. Pt reports 1 fall immediately after stroke, otherwise no history of  falls. Only PT he has recieved has been post-infarct. Pt arrivied with RW however states he rarely uses it and does nto use it in the home.    Limitations House hold activities;Walking;Lifting;Writing    How long can you sit comfortably? unaffected    How long can you stand comfortably? states standing is not a problem    How long can you walk comfortably? states walking is not a problem    Patient Stated Goals return to work and driving, be fully independent as he was before cerebellar infarct    Currently in Pain? No/denies                [facility] PT Assessment - 10/11/20 0001       Standardized Balance Assessment   Standardized Balance Assessment Berg Balance Test      Berg Balance Test   Sit to Stand Able to stand  independently using hands    Standing Unsupported Able to stand safely 2 minutes    Sitting with Back Unsupported but Feet Supported on Floor or Stool Able to sit safely and securely 2 minutes    Stand to Sit Controls descent by using hands    Transfers Able to transfer safely, definite need of hands    Standing Unsupported with Eyes Closed Able to stand 10 seconds safely    Standing Unsupported with Feet Together  Able to place feet together independently and stand 1 minute safely    From Standing, Reach Forward with Outstretched Arm Can reach forward >12 cm safely (5")    From Standing Position, Pick up Object from [place] to pick up shoe, needs supervision    From Standing Position, Turn to Look Behind Over each Shoulder Looks behind from both sides and weight shifts well   some unsteadiness looking over right shoulder   Turn 360 Degrees Able to turn 360 degrees safely but slowly    Standing Unsupported, Alternately Place Feet on Step/Stool Able to complete >2 steps/needs minimal assist    Standing Unsupported, One Foot in Front Able to plae foot ahead of the other independently and hold 30 seconds    Standing on One Leg Tries to lift leg/unable to hold 3 seconds  but remains standing independently    Total Score 42             Treatment provided this session   STS 2 x 10  -no Hands/ hands on knees  -Cues for proper LE placement to allow equal weightbearing  The following activities were completed in parallel bars or at balance bar   Heel/ toe rocks 2 x 10  -B HH assist   Marching 2 x 10  - B HH assist   Tandem stance 2  x 30 sec  -HH A as needed -Second set required increased UE support compared to first set -More UE support required with R LE posterior   Unless otherwise stated, CGA was provided and gait belt donned in order to ensure pt safety PT session cut short due to pt arriving to appointment several minutes late  HEP provided this date   Access Code: [email or web address] URL: [email or web address] Date: 10/11/2020 Prepared by: Rivka Barbara  Exercises Sit to Stand - 1 x daily - 7 x weekly - 2 sets - 10 reps Standing March with Counter Support - 1 x daily - 7 x weekly - 2 sets - 10 reps Heel Toe Raises with Counter Support - 1 x daily - 7 x weekly - 2 sets - 10 reps Standing Tandem Balance with Counter Support - 1 x daily - 7 x weekly - 2 sets - 30 sec hold                      PT Education - 10/11/20 0942     Education Details HEP provided, demonstrated and explained, details later in note    Person(s) Educated Patient    Methods Explanation    Comprehension Verbalized understanding;Returned demonstration;Verbal cues required              PT Short Term Goals - 10/08/20 1713       PT SHORT TERM GOAL #1   Title Patient will be independent in home exercise program to improve strength/mobility for better functional independence with ADLs.    Time 6    Period Weeks    Status New    Target Date 11/19/20               PT Long Term Goals - 10/11/20 1147       PT LONG TERM GOAL #1   Title Patient will increase FOTO score to equal to or greater than 77 to demonstrate  statistically significant improvement in mobility and quality of life.    Baseline 10/08/20: 63    Time 12    Period Weeks    Status  New    Target Date 12/31/20      PT LONG TERM GOAL #2   Title Pt will improve BERG by at least 3 points in order to demonstrate clinically significant improvement in balance.    Baseline 42 10/11/20    Time 8    Period Weeks    Status New    Target Date 12/31/20      PT LONG TERM GOAL #3   Title Patient will reduce timed up and go to <11 seconds to reduce fall risk and demonstrate improved transfer/gait ability.    Baseline 10/08/20: 12.68 seconds;    Time 12    Period Weeks    Status New    Target Date 12/31/20      PT LONG TERM GOAL #4   Title Patient will increase six minute walk test distance to >1000 for progression to community ambulator and improve gait ability    Baseline 10/08/20: 92f    Time 12    Period Weeks    Status New    Target Date 12/31/20      PT LONG TERM GOAL #5   Title Patient will increase Functional Gait Assessment score to >20/30 as to reduce fall risk and improve dynamic gait safety with community ambulation.    Time 12    Period Weeks    Status New    Target Date 12/31/20                   Plan - 10/11/20 1132     Clinical Impression Statement Began plan of care as laid out in evaluation. Pt remains motivated to advance progress toward goals in order to maximize independence and safety at home. Pt requires high level assistance and cuing for completion of exercises in order to provide adequate level of stimulation and perturbation. Author allows pt as much opportunity as possible to perform independent righting strategies, only stepping in when pt is unable to prevent falling to floor. Pt closely monitored throughout session for safe vitals response and to maximize patient safety during interventions. Completed BERG balance scale today and pt had some difficulty with transitions between sitting and standing,  single leg balance, and step taps indicating some deficits in strength and neuromuscular control. Pt will continue to benefit from skilled physical therapy in order to achieve therapy goals and improve QOL.     Personal Factors and Comorbidities Age;Comorbidity 1;Comorbidity 2    Comorbidities HTN , DM    Examination-Activity Limitations Locomotion Level;Transfers;Stairs    Examination-Participation Restrictions Driving;Occupation;Community Activity;Yard Work    Stability/Clinical Decision Making Stable/Uncomplicated    CDesigner, jewelleryLow    Rehab Potential Excellent    PT Frequency 2x / week    PT Duration 12 weeks    PT Treatment/Interventions ADLs/Self Care Home Management;Aquatic Therapy;Biofeedback;Cryotherapy;Electrical Stimulation;Moist Heat;Traction;Ultrasound;Gait training;DME Instruction;Stair training;Functional mobility training;Therapeutic activities;Neuromuscular re-education;Balance training;Therapeutic exercise;Patient/family education;Manual techniques    PT Next Visit Plan begin dynamc gait training activity, continue with LE strengthening program    PT Home Exercise Plan Provided Access Code: [email or web address] URL: [email or web address]  Date: 10/11/2020  Prepared by: CRivka Barbara   Consulted and Agree with Plan of Care Patient             Patient will benefit from skilled therapeutic intervention in order to improve the following deficits and impairments:  Abnormal gait, Decreased activity tolerance, Decreased endurance, Impaired sensation, Improper body mechanics, Decreased balance, Decreased coordination, Decreased mobility, Decreased safety awareness, Difficulty walking  Visit Diagnosis: Other abnormalities of gait and mobility  Difficulty in walking, not elsewhere classified  Unsteadiness on feet     Problem List Patient Active Problem List   Diagnosis Date Noted   Vitamin D deficiency 10/04/2020   Mediastinal adenopathy 10/04/2020    Hyponatremia    Transaminitis    Diabetes mellitus, new onset ([place])    Malnutrition of moderate degree 09/13/2020   Stroke ([place]) 09/12/2020   Aortic thrombus ([place])    Hyperlipidemia    Essential hypertension    Impaired fasting glucose    Cerebellar stroke ([place]) 09/11/2020   Rivka Barbara PT, DPT  Particia Lather 10/11/2020, 11:49 AM  [place] [address] [place], [place], [address] Phone: [phone]   Fax:  [phone]  Name: Malik Jones MRN: [id number] Date of Birth: [birth year]

## 2020-10-11 NOTE — Therapy (Signed)
[place] MAIN [facility] SERVICES [address] [place], [place], [address] Phone: [phone]   Fax:  [phone]  Occupational Therapy Treatment  Patient Details  Name: Malik Jones MRN: [id number] Date of Birth: [birth year] Referring Provider (OT): Dr. Leeroy Cha   Encounter Date: 10/11/2020   OT End of Session - 10/11/20 1223     Visit Number 2    Number of Visits 24    Date for OT Re-Evaluation 12/30/20    Authorization Time Period Reporting period starting 10/08/2020    OT Start Time 0930    OT Stop Time 1015    OT Time Calculation (min) 45 min    Activity Tolerance Patient tolerated treatment well    Behavior During Therapy [facility] for tasks assessed/performed             Past Medical History:  Diagnosis Date   Medical history non-contributory     Past Surgical History:  Procedure Laterality Date   MANDIBLE FRACTURE SURGERY     NO PAST SURGERIES      There were no vitals filed for this visit.   Subjective Assessment - 10/11/20 1222     Subjective  "I cooked some fish myself this week."    Pertinent History cerebellar stroke    Limitations balance, R sided weakness and ataxia    Patient Stated Goals "I want to get back to normal."    Currently in Pain? No/denies    Pain Score 0-No pain            Occupational Therapy Treatment: Neuro re-ed: Facilitated RUE gross and [place] activity using R hand to pick up washers from dish and placing over vertical dowels.  Multiple washers dropped and frequent bumping of vertical dowels with R hand.  Practiced storage and translation of objects in hand, picking up washers from table 1 at a time, and discarding from hand 1 at a time.  Facilitated digit isolation flipping blocks 180 with R hand, focusing on speed and accuracy.  Pt improved speed on second trial, 3rd trial was slower than the first d/t fatigue throughout RUE, resulting in increased dropping and bumping of blocks.  Stacked  blocks 5 at a time with occasional dropping.  Used hand gripper set at 23.4 lbs to place and remove jumbo pegs from pegboard x3 trials.  Rest breaks needed between sets, occasional dropping of pegs d/t difficulty sustaining grip.   Response to Treatment: See Plan/clinical impression below.     OT Education - 10/11/20 1223     Education Details HEP progression    Person(s) Educated Patient    Methods Explanation;Demonstration;Verbal cues;Handout    Comprehension Verbalized understanding;Returned demonstration;Verbal cues required;Need further instruction              OT Short Term Goals - 10/09/20 0836       OT SHORT TERM GOAL #1   Title Pt will be indep with HEP for RUE strength/coordination    Baseline Eval: HEP initiated at eval, will continue to add on    Time 6    Period Weeks    Status New    Target Date 11/18/20               OT Long Term Goals - 10/09/20 0837       OT LONG TERM GOAL #1   Title Pt will increase FOTO score to 60 or better to indicate increased functional performance.    Baseline Eval: FOTO 52  Time 12    Period Weeks    Status New    Target Date 12/30/20      OT LONG TERM GOAL #2   Title Pt will increase R grip strength to 80 lbs or better to improve carrying heavier ADL supplies with R dominant hand.    Baseline Eval: R grip 65 lbs (L non-dominant 76 lbs)    Time 12    Period Weeks    Status New    Target Date 12/30/20      OT LONG TERM GOAL #3   Title Pt will improve ability to pick up and manipulate small ADL supplies without dropping with R dominant hand as demonstrated by improved 9 hole peg test score to <30 sec or better.    Baseline Eval: R 9 hole peg test 56.3 sec (L non-dominant 20.6 sec)    Time 12    Period Weeks    Status New    Target Date 12/30/20      OT LONG TERM GOAL #4   Title Pt will improve R GMC as demonstrated by ability to brush teeth, shave, eat with R dominant hand.    Baseline Eval: Pt using L  non-dominant hand to brush teeth, shave, eat d/t moderate ataxia throughout RUE.    Time 12    Period Weeks    Status New    Target Date 12/30/20                   Plan - 10/11/20 1232     Clinical Impression Statement Pt reported making his own dinner of fish this week after family had mostly been bringing meals to him.  RUE ataxia continues to limit use of R hand for self care.  Urged pt start using R hand for finger foods, allow extra time, but continued attempts and efforts will help to improve accuracy.  Pt verbalized understanding.  Pt will benefit from continued Occupational Therapy to address deficits noted above in order to maximize indep with ADLs/IADLs/work/leisure.    OT Occupational Profile and History Detailed Assessment- Review of Records and additional review of physical, cognitive, psychosocial history related to current functional performance    Occupational performance deficits (Please refer to evaluation for details): ADL's;IADL's;Leisure    Body Structure / Function / Physical Skills ADL;Dexterity;Strength;Balance;Coordination;FMC;IADL;Endurance;Sensation;UE functional use;GMC    Rehab Potential Excellent    Clinical Decision Making Several treatment options, min-mod task modification necessary    Comorbidities Affecting Occupational Performance: May have comorbidities impacting occupational performance    Modification or Assistance to Complete Evaluation  No modification of tasks or assist necessary to complete eval    OT Frequency 2x / week    OT Duration 12 weeks    OT Treatment/Interventions Self-care/ADL training;Therapeutic exercise;DME and/or AE instruction;Balance training;Neuromuscular education;Energy conservation;Therapeutic activities;Patient/family education;Coping strategies training    OT Home Exercise Plan theraputty issued and instructed in exercises today with handout given    Consulted and Agree with Plan of Care Patient              Patient will benefit from skilled therapeutic intervention in order to improve the following deficits and impairments:   Body Structure / Function / Physical Skills: ADL, Dexterity, Strength, Balance, Coordination, FMC, IADL, Endurance, Sensation, UE functional use, GMC       Visit Diagnosis: Cerebellar stroke (HCC)  Other lack of coordination  Muscle weakness (generalized)    Problem List Patient Active Problem List   Diagnosis Date Noted  Vitamin D deficiency 10/04/2020   Mediastinal adenopathy 10/04/2020   Hyponatremia    Transaminitis    Diabetes mellitus, new onset ([place])    Malnutrition of moderate degree 09/13/2020   Stroke ([place]) 09/12/2020   Aortic thrombus (HCC)    Hyperlipidemia    Essential hypertension    Impaired fasting glucose    Cerebellar stroke ([place]) 09/11/2020   Leta Speller, MS, OTR/L  Darleene Cleaver 10/11/2020, 12:33 PM  [place] MAIN [facility] SERVICES [address] [place], [place], [address] Phone: [phone]   Fax:  [phone]  Name: Malik Jones MRN: [id number] Date of Birth: 1961-01-17

## 2020-10-15 ENCOUNTER — Ambulatory Visit: Payer: BC Managed Care – PPO | Admitting: Physical Therapy

## 2020-10-15 ENCOUNTER — Ambulatory Visit: Payer: BC Managed Care – PPO | Admitting: Occupational Therapy

## 2020-10-15 ENCOUNTER — Other Ambulatory Visit: Payer: Self-pay

## 2020-10-15 ENCOUNTER — Encounter: Payer: Self-pay | Admitting: Occupational Therapy

## 2020-10-15 DIAGNOSIS — R262 Difficulty in walking, not elsewhere classified: Secondary | ICD-10-CM | POA: Diagnosis not present

## 2020-10-15 DIAGNOSIS — I639 Cerebral infarction, unspecified: Secondary | ICD-10-CM | POA: Diagnosis not present

## 2020-10-15 DIAGNOSIS — M6281 Muscle weakness (generalized): Secondary | ICD-10-CM

## 2020-10-15 DIAGNOSIS — R2681 Unsteadiness on feet: Secondary | ICD-10-CM | POA: Diagnosis not present

## 2020-10-15 DIAGNOSIS — R278 Other lack of coordination: Secondary | ICD-10-CM

## 2020-10-15 DIAGNOSIS — R2689 Other abnormalities of gait and mobility: Secondary | ICD-10-CM

## 2020-10-15 DIAGNOSIS — R269 Unspecified abnormalities of gait and mobility: Secondary | ICD-10-CM | POA: Diagnosis not present

## 2020-10-15 NOTE — Therapy (Signed)
[place] MAIN [facility] SERVICES [address] [place], [place], [address] Phone: [phone]   Fax:  [phone]  Occupational Therapy Treatment  Patient Details  Name: Malik Jones MRN: [id number] Date of Birth: 1961-01-27 Referring Provider (OT): Dr. Leeroy Cha   Encounter Date: 10/15/2020   OT End of Session - 10/15/20 0855     Visit Number 3    Number of Visits 24    Date for OT Re-Evaluation 12/30/20    Authorization Time Period Reporting period starting 10/08/2020    OT Start Time 0850    OT Stop Time 0930    OT Time Calculation (min) 40 min    Activity Tolerance Patient tolerated treatment well    Behavior During Therapy [facility] for tasks assessed/performed             Past Medical History:  Diagnosis Date   Medical history non-contributory     Past Surgical History:  Procedure Laterality Date   MANDIBLE FRACTURE SURGERY     NO PAST SURGERIES      There were no vitals filed for this visit.   Subjective Assessment - 10/15/20 0854     Subjective  "I cooked some fish myself this week."    Pertinent History cerebellar stroke    Limitations balance, R sided weakness and ataxia    Currently in Pain? No/denies            OT TREATMENT    Neuro muscular re-education:  Pt. performed right [facility] tasks using the Grooved pegboard. Pt. worked on grasping the grooved pegs from a horizontal position, and moving the pegs to a vertical position in the hand to prepare for placing them in the grooved slot.    Therapeutic Exercise:  Pt. performed right gross gripping with a gross grip strengthener. Pt. worked on sustaining grip while grasping pegs and reaching at various heights. The Gripper was set to 11.2#, followed by a rep of 17.9# of grip strength resistance.   Pt. is making progress, and is starting to engage his right hand more during self-feeding, and daily care tasks. Pt. Reports that he tries to use it when he is at  home, however, reports that when eating out he uses his left hand. Pt. presents with decreased strength, as well as impaired motor control, and [facility] skills in the right hand. Pt. continues to work on improving RUE strength, and [facility] skills in order to work towards improving, and maximizing independence with ADLs, and IADL tasks.                         OT Education - 10/15/20 0855     Education Details RUE strengthening, [facility]    Person(s) Educated Patient    Methods Explanation;Demonstration;Verbal cues;Handout    Comprehension Verbalized understanding;Returned demonstration;Verbal cues required;Need further instruction              OT Short Term Goals - 10/09/20 0836       OT SHORT TERM GOAL #1   Title Pt will be indep with HEP for RUE strength/coordination    Baseline Eval: HEP initiated at eval, will continue to add on    Time 6    Period Weeks    Status New    Target Date 11/18/20               OT Long Term Goals - 10/09/20 0837       OT LONG TERM  GOAL #1   Title Pt will increase FOTO score to 60 or better to indicate increased functional performance.    Baseline Eval: FOTO 52    Time 12    Period Weeks    Status New    Target Date 12/30/20      OT LONG TERM GOAL #2   Title Pt will increase R grip strength to 80 lbs or better to improve carrying heavier ADL supplies with R dominant hand.    Baseline Eval: R grip 65 lbs (L non-dominant 76 lbs)    Time 12    Period Weeks    Status New    Target Date 12/30/20      OT LONG TERM GOAL #3   Title Pt will improve ability to pick up and manipulate small ADL supplies without dropping with R dominant hand as demonstrated by improved 9 hole peg test score to <30 sec or better.    Baseline Eval: R 9 hole peg test 56.3 sec (L non-dominant 20.6 sec)    Time 12    Period Weeks    Status New    Target Date 12/30/20      OT LONG TERM GOAL #4   Title Pt will improve R GMC as demonstrated by ability to  brush teeth, shave, eat with R dominant hand.    Baseline Eval: Pt using L non-dominant hand to brush teeth, shave, eat d/t moderate ataxia throughout RUE.    Time 12    Period Weeks    Status New    Target Date 12/30/20                   Plan - 10/15/20 0856     Clinical Impression Statement Pt. is making progress, and is starting to engage his right hand more during self-feeding, and daily care tasks. Pt. Reports that he tries to use it when he is at home, however, reports that when eating out he uses his left hand. Pt. presents with decreased strength, as well as impaired motor control, and [facility] skills in the right hand. Pt. continues to work on improving RUE strength, and [facility] skills in order to work towards improving, and maximizing independence with ADLs, and IADL tasks.    OT Occupational Profile and History Detailed Assessment- Review of Records and additional review of physical, cognitive, psychosocial history related to current functional performance    Occupational performance deficits (Please refer to evaluation for details): ADL's;IADL's;Leisure    Body Structure / Function / Physical Skills ADL;Dexterity;Strength;Balance;Coordination;FMC;IADL;Endurance;Sensation;UE functional use;GMC    Rehab Potential Excellent    Clinical Decision Making Several treatment options, min-mod task modification necessary    Comorbidities Affecting Occupational Performance: May have comorbidities impacting occupational performance    Modification or Assistance to Complete Evaluation  No modification of tasks or assist necessary to complete eval    OT Frequency 2x / week    OT Duration 12 weeks    OT Treatment/Interventions Self-care/ADL training;Therapeutic exercise;DME and/or AE instruction;Balance training;Neuromuscular education;Energy conservation;Therapeutic activities;Patient/family education;Coping strategies training    Consulted and Agree with Plan of Care Patient              Patient will benefit from skilled therapeutic intervention in order to improve the following deficits and impairments:   Body Structure / Function / Physical Skills: ADL, Dexterity, Strength, Balance, Coordination, FMC, IADL, Endurance, Sensation, UE functional use, GMC       Visit Diagnosis: Muscle weakness (generalized)  Other lack of coordination  Problem List Patient Active Problem List   Diagnosis Date Noted   Vitamin D deficiency 10/04/2020   Mediastinal adenopathy 10/04/2020   Hyponatremia    Transaminitis    Diabetes mellitus, new onset ([place])    Malnutrition of moderate degree 09/13/2020   Stroke ([place]) 09/12/2020   Aortic thrombus ([place])    Hyperlipidemia    Essential hypertension    Impaired fasting glucose    Cerebellar stroke ([place]) 09/11/2020    Harrel Carina, MS, OTR/L 10/15/2020, 9:02 AM  [place], [place], [address] Phone: [phone]   Fax:  [phone]  Name: Malik Jones MRN: [id number] Date of Birth: 12/07/1960

## 2020-10-15 NOTE — Therapy (Signed)
[place] MAIN [facility] SERVICES [address] [place], [place], [address] Phone: [phone]   Fax:  [phone]  Physical Therapy Treatment  Patient Details  Name: Malik Jones MRN: [id number] Date of Birth: 1960/07/25 No data recorded  Encounter Date: 10/15/2020   PT End of Session - 10/15/20 1019     Visit Number 3    Number of Visits 24    Date for PT Re-Evaluation 12/31/20    Progress Note Due on Visit 10    PT Start Time 0816    PT Stop Time 0852    PT Time Calculation (min) 36 min    Equipment Utilized During Treatment Gait belt    Activity Tolerance Patient tolerated treatment well    Behavior During Therapy WFL for tasks assessed/performed             Past Medical History:  Diagnosis Date   Medical history non-contributory     Past Surgical History:  Procedure Laterality Date   MANDIBLE FRACTURE SURGERY     NO PAST SURGERIES      There were no vitals filed for this visit.   Subjective Assessment - 10/15/20 0821     Subjective Pt reports no significant changes since last session. No falls/near falls since last session. He has been performing HEP with no quesions/concerns.    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 Infarct occured on 09/11/20; no significant PMH. He attended inpatient rehab services at [facility] for 2 weeks and d/c from inpatient 10 days ago. He lived with his brother for 1 week before moving back into his own home. He is now living independently with abundant family check-ins. He is currently not driving or employed. He states his employer has had to post his job during this time. Pt reports 1 fall immediately after stroke, otherwise no history of falls. Only PT he has recieved has been post-infarct. Pt arrivied with RW however states he rarely uses  it and does nto use it in the home.    Limitations House hold activities;Walking;Lifting;Writing    How long can you sit comfortably? unaffected    How long can you stand comfortably? states standing is not a problem    How long can you walk comfortably? states walking is not a problem    Patient Stated Goals return to work and driving, be fully independent as he was before cerebellar infarct    Currently in Pain? No/denies                [facility] PT Assessment - 10/15/20 1029       Functional Gait  Assessment   Gait assessed  Yes    Gait Level Surface Walks 20 ft, slow speed, abnormal gait pattern, evidence for imbalance or deviates 10-15 in outside of the 12 in walkway width. Requires more than 7 sec to ambulate 20 ft.    Change in Gait Speed Able to change speed, demonstrates mild gait deviations, deviates 6-10 in outside of the 12 in walkway width, or no gait deviations, unable to achieve a major change in velocity, or uses a change in velocity, or uses an assistive device.    Gait with Horizontal Head Turns Performs head turns smoothly with slight change in gait velocity (eg, minor disruption to smooth gait path), deviates 6-10 in outside 12 in walkway width, or  uses an assistive device.    Gait with Vertical Head Turns Performs task with slight change in gait velocity (eg, minor disruption to smooth gait path), deviates 6 - 10 in outside 12 in walkway width or uses assistive device    Gait and Pivot Turn Turns slowly, requires verbal cueing, or requires several small steps to catch balance following turn and stop    Step Over Obstacle Is able to step over one shoe box (4.5 in total height) but must slow down and adjust steps to clear box safely. May require verbal cueing.    Gait with Narrow Base of Support Ambulates less than 4 steps heel to toe or cannot perform without assistance.    Gait with Eyes Closed Walks 20 ft, uses assistive device, slower speed, mild gait deviations,  deviates 6-10 in outside 12 in walkway width. Ambulates 20 ft in less than 9 sec but greater than 7 sec.    Ambulating Backwards Walks 20 ft, uses assistive device, slower speed, mild gait deviations, deviates 6-10 in outside 12 in walkway width.    Steps Two feet to a stair, must use rail.    Total Score 14    FGA comment: No AD. Time: 7.43 seconds. Reciprocal up steps using railing, step-to leading with RLE to descend stairs.            INTERVENTION   Assessed FGA, no AD.  Score: 14/30  STS, no UE support, 2 x 10 reps.   Tandem stance x 45 seconds each LE, increased sway with RLE posterior, difficulty obtaining position, CGA throughout.  Step up to 6" step using reciprocal stepping pattern, BLE; 5 reps with UE support followed by 1 minute with no UE support. Performed for coordination of stepping and foot placement/LE awareness.   Toe taps to hedgehog, 1 minute each LE x 2 sets. Difficulty with RLE coordination. VC for control and light taps with RLE. Good SLB demo on RLE during LLE taps.    Clinical Impression: Session limited due to patient arriving late. FGA was completed to assess balance during functional ambulatory tasks. Pt demo difficulty with pivot turn, narrow BOS and demo lateral drift during head turns, eyes closed and backwards walking. "Normal" gait speed continues to be impaired; VC for arm swings during transitional ambulation within the gym. RLE coordination continues to present as impaired however performance improved with practice and repetition during coordination activities. Pt will benefit from skilled PT services to address deficits in gait, coordination and balance for improved quality of life, to return to work/driving and decrease risk for future falls.        PT Short Term Goals - 10/08/20 1713       PT SHORT TERM GOAL #1   Title Patient will be independent in home exercise program to improve strength/mobility for better functional independence with  ADLs.    Time 6    Period Weeks    Status New    Target Date 11/19/20               PT Long Term Goals - 10/15/20 1021       PT LONG TERM GOAL #1   Title Patient will increase FOTO score to equal to or greater than 77 to demonstrate statistically significant improvement in mobility and quality of life.    Baseline 10/08/20: 63    Time 12    Period Weeks    Status New      PT LONG TERM GOAL #  2   Title Pt will improve BERG by at least 3 points in order to demonstrate clinically significant improvement in balance.    Baseline 42 10/11/20    Time 8    Period Weeks    Status New      PT LONG TERM GOAL #3   Title Patient will reduce timed up and go to <11 seconds to reduce fall risk and demonstrate improved transfer/gait ability.    Baseline 10/08/20: 12.68 seconds;    Time 12    Period Weeks    Status New      PT LONG TERM GOAL #4   Title Patient will increase six minute walk test distance to >1000 for progression to community ambulator and improve gait ability    Baseline 10/08/20: 942f    Time 12    Period Weeks    Status New      PT LONG TERM GOAL #5   Title Patient will increase Functional Gait Assessment score to >20/30 as to reduce fall risk and improve dynamic gait safety with community ambulation.    Baseline 10/15/20: 14/30    Time 12    Period Weeks    Status New    Target Date 12/31/20                   Plan - 10/15/20 1020     Clinical Impression Statement Session limited due to patient arriving late. FGA was completed to assess balance during functional ambulatory tasks. Pt demo difficulty with pivot turn, narrow BOS and demo lateral drift during head turns, eyes closed and backwards walking. "Normal" gait speed continues to be impaired; VC for arm swings during transitional ambulation within the gym. RLE coordination continues to present as impaired however performance improved with practice and repetition during coordination activities. Pt will  benefit from skilled PT services to address deficits in gait, coordination and balance for improved quality of life, to return to work/driving and decrease risk for future falls.    Personal Factors and Comorbidities Age;Comorbidity 1;Comorbidity 2    Comorbidities HTN , DM    Examination-Activity Limitations Locomotion Level;Transfers;Stairs    Examination-Participation Restrictions Driving;Occupation;Community Activity;Yard Work    Stability/Clinical Decision Making Stable/Uncomplicated    Rehab Potential Excellent    PT Frequency 2x / week    PT Duration 12 weeks    PT Treatment/Interventions ADLs/Self Care Home Management;Aquatic Therapy;Biofeedback;Cryotherapy;Electrical Stimulation;Moist Heat;Traction;Ultrasound;Gait training;DME Instruction;Stair training;Functional mobility training;Therapeutic activities;Neuromuscular re-education;Balance training;Therapeutic exercise;Patient/family education;Manual techniques    PT Next Visit Plan begin dynamc gait training activity, continue with LE strengthening program    PT Home Exercise Plan Provided Access Code: [email or web address] URL: [email or web address]  Date: 10/11/2020  Prepared by: CRivka Barbara   Consulted and Agree with Plan of Care Patient             Patient will benefit from skilled therapeutic intervention in order to improve the following deficits and impairments:  Abnormal gait, Decreased activity tolerance, Decreased endurance, Impaired sensation, Improper body mechanics, Decreased balance, Decreased coordination, Decreased mobility, Decreased safety awareness, Difficulty walking  Visit Diagnosis: Cerebellar stroke (HCC)  Other lack of coordination  Muscle weakness (generalized)  Other abnormalities of gait and mobility  Difficulty in walking, not elsewhere classified  Unsteadiness on feet     Problem List Patient Active Problem List   Diagnosis Date Noted   Vitamin D deficiency 10/04/2020    Mediastinal adenopathy 10/04/2020   Hyponatremia    Transaminitis    Diabetes mellitus,  new onset ([place])    Malnutrition of moderate degree 09/13/2020   Stroke ([place]) 09/12/2020   Aortic thrombus (HCC)    Hyperlipidemia    Essential hypertension    Impaired fasting glucose    Cerebellar stroke ([place]) 09/11/2020    Patrina Levering PT, DPT  [facility] 10/15/2020, 10:32 AM  [place] MAIN [facility] SERVICES [address] [place], [place], [address] Phone: [phone]   Fax:  [phone]  Name: Ryland Michalski MRN: [id number] Date of Birth: April 06, 1960

## 2020-10-17 ENCOUNTER — Other Ambulatory Visit: Payer: Self-pay

## 2020-10-17 ENCOUNTER — Ambulatory Visit: Payer: BC Managed Care – PPO

## 2020-10-17 DIAGNOSIS — R262 Difficulty in walking, not elsewhere classified: Secondary | ICD-10-CM | POA: Diagnosis not present

## 2020-10-17 DIAGNOSIS — M6281 Muscle weakness (generalized): Secondary | ICD-10-CM

## 2020-10-17 DIAGNOSIS — R2689 Other abnormalities of gait and mobility: Secondary | ICD-10-CM | POA: Diagnosis not present

## 2020-10-17 DIAGNOSIS — I639 Cerebral infarction, unspecified: Secondary | ICD-10-CM | POA: Diagnosis not present

## 2020-10-17 DIAGNOSIS — R2681 Unsteadiness on feet: Secondary | ICD-10-CM

## 2020-10-17 DIAGNOSIS — R278 Other lack of coordination: Secondary | ICD-10-CM | POA: Diagnosis not present

## 2020-10-17 DIAGNOSIS — R269 Unspecified abnormalities of gait and mobility: Secondary | ICD-10-CM | POA: Diagnosis not present

## 2020-10-17 NOTE — Therapy (Signed)
[place] MAIN [facility] SERVICES [address] [place], [place], [address] Phone: [phone]   Fax:  [phone]  Physical Therapy Treatment  Patient Details  Name: Malik Jones MRN: [id number] Date of Birth: 04-25-1960 No data recorded  Encounter Date: 10/17/2020   PT End of Session - 10/17/20 0918     Visit Number 4    Number of Visits 24    Date for PT Re-Evaluation 12/31/20    Authorization - Number of Visits 4    Progress Note Due on Visit 10    PT Start Time 0855    PT Stop Time 0929    PT Time Calculation (min) 34 min    Equipment Utilized During Treatment Gait belt    Activity Tolerance Patient tolerated treatment well    Behavior During Therapy WFL for tasks assessed/performed             Past Medical History:  Diagnosis Date   Medical history non-contributory     Past Surgical History:  Procedure Laterality Date   MANDIBLE FRACTURE SURGERY     NO PAST SURGERIES      There were no vitals filed for this visit.   Subjective Assessment - 10/17/20 0916     Subjective Patient late to session due to issues with ride. Has been compliant with HEP, no falls or LOB since last sesion.    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 Infarct occured on 09/11/20; no significant PMH. He attended inpatient rehab services at [facility] for 2 weeks and d/c from inpatient 10 days ago. He lived with his brother for 1 week before moving back into his own home. He is now living independently with abundant family check-ins. He is currently not driving or employed. He states his employer has had to post his job during this time. Pt reports 1 fall immediately after stroke, otherwise no history of falls. Only PT he has recieved has been post-infarct. Pt arrivied with RW however states he rarely  uses it and does nto use it in the home.    Limitations House hold activities;Walking;Lifting;Writing    How long can you sit comfortably? unaffected    How long can you stand comfortably? states standing is not a problem    How long can you walk comfortably? states walking is not a problem    Patient Stated Goals return to work and driving, be fully independent as he was before cerebellar infarct    Currently in Pain? No/denies                BP at start of session  140/88   TherEx:  STS, no UE support, 2 x 10 reps.; second set with jump at top of stand very challenging for RLE.   Ambulate 160 ft without AD cues for RLE foot placement and close CGA x 3 trials with third trial with horizontal head turns ; no LOB cues for heel strike.     Standing with # 2.5 ankle weight: CGA for stability  -Hip extension with BUE upper extremity support, cueing for neutral hip alignment, upright posture for optimal muscle recruitment, and sequencing, 10x each LE,  -Hip abduction with BUE upper extremity support, cueing for neutral foot alignment for correct muscle activation, 10x each LE -Hip flexion with BUE upper extremity support, cueing for body mechanics, speed of  muscle recruitment for optimal strengthening and stabilization 10x each LE -Hamstring curl with BUE upper extremity support, cueing for knee alignment for recruitment of hamstring musculature, 10x each LE   Seated with # 2.5 ankle weights  -Seated marches with upright posture, back away from back of chair for abdominal/trunk activation/stabilization, 10x each LE -Seated LAQ with 3 second holds, 10x each LE, cueing for muscle activation and sequencing for neutral alignment -Seated IR/ER with cueing for stabilizing knee placement with lateral foot movement for optimal muscle recruitment, 10x each LE   Neuro Re-ed: -airex balance beam: 6x length of // bars very challenging for RLE placement  -Toe taps to hedgehog, 1 minute each  LE x 2 sets. Difficulty with RLE coordination. VC for control and light taps with RLE. Good SLB demo on RLE during LLE taps.    Pt educated throughout session about proper posture and technique with exercises. Improved exercise technique, movement at target joints, use of target muscles after min to mod verbal, visual, tactile cues.  Vitals monitored throughout physical therapy session. With rest brooks to return to therapeutic range.   Patient arrived late to PT session limiting session duration. Patient is highly motivated throughout physical therapy session. RLE is very limited with spatial awareness. Strengthening of RLE tolerated well with occasional rest breaks. Pt will benefit from skilled PT services to address deficits in gait, coordination and balance for improved quality of life, to return to work/driving and decrease risk for future falls.                  PT Education - 10/17/20 0917     Education Details exercise technique, body mechanics    Person(s) Educated Patient    Methods Explanation;Demonstration;Tactile cues;Verbal cues    Comprehension Verbalized understanding;Returned demonstration;Verbal cues required;Tactile cues required              PT Short Term Goals - 10/08/20 1713       PT SHORT TERM GOAL #1   Title Patient will be independent in home exercise program to improve strength/mobility for better functional independence with ADLs.    Time 6    Period Weeks    Status New    Target Date 11/19/20               PT Long Term Goals - 10/15/20 1021       PT LONG TERM GOAL #1   Title Patient will increase FOTO score to equal to or greater than 77 to demonstrate statistically significant improvement in mobility and quality of life.    Baseline 10/08/20: 63    Time 12    Period Weeks    Status New      PT LONG TERM GOAL #2   Title Pt will improve BERG by at least 3 points in order to demonstrate clinically significant improvement in  balance.    Baseline 42 10/11/20    Time 8    Period Weeks    Status New      PT LONG TERM GOAL #3   Title Patient will reduce timed up and go to <11 seconds to reduce fall risk and demonstrate improved transfer/gait ability.    Baseline 10/08/20: 12.68 seconds;    Time 12    Period Weeks    Status New      PT LONG TERM GOAL #4   Title Patient will increase six minute walk test distance to >1000 for progression to community ambulator and improve gait  ability    Baseline 10/08/20: 944f    Time 12    Period Weeks    Status New      PT LONG TERM GOAL #5   Title Patient will increase Functional Gait Assessment score to >20/30 as to reduce fall risk and improve dynamic gait safety with community ambulation.    Baseline 10/15/20: 14/30    Time 12    Period Weeks    Status New    Target Date 12/31/20                   Plan - 10/17/20 0919     Clinical Impression Statement Patient arrived late to PT session limiting session duration. Patient is highly motivated throughout physical therapy session. RLE is very limited with spatial awareness. Strengthening of RLE tolerated well with occasional rest breaks. Pt will benefit from skilled PT services to address deficits in gait, coordination and balance for improved quality of life, to return to work/driving and decrease risk for future falls.    Personal Factors and Comorbidities Age;Comorbidity 1;Comorbidity 2    Comorbidities HTN , DM    Examination-Activity Limitations Locomotion Level;Transfers;Stairs    Examination-Participation Restrictions Driving;Occupation;Community Activity;Yard Work    Stability/Clinical Decision Making Stable/Uncomplicated    Rehab Potential Excellent    PT Frequency 2x / week    PT Duration 12 weeks    PT Treatment/Interventions ADLs/Self Care Home Management;Aquatic Therapy;Biofeedback;Cryotherapy;Electrical Stimulation;Moist Heat;Traction;Ultrasound;Gait training;DME Instruction;Stair  training;Functional mobility training;Therapeutic activities;Neuromuscular re-education;Balance training;Therapeutic exercise;Patient/family education;Manual techniques    PT Next Visit Plan begin dynamc gait training activity, continue with LE strengthening program    PT Home Exercise Plan Provided Access Code: [email or web address] URL: [email or web address]  Date: 10/11/2020  Prepared by: CRivka Barbara   Consulted and Agree with Plan of Care Patient             Patient will benefit from skilled therapeutic intervention in order to improve the following deficits and impairments:  Abnormal gait, Decreased activity tolerance, Decreased endurance, Impaired sensation, Improper body mechanics, Decreased balance, Decreased coordination, Decreased mobility, Decreased safety awareness, Difficulty walking  Visit Diagnosis: Muscle weakness (generalized)  Other abnormalities of gait and mobility  Unsteadiness on feet     Problem List Patient Active Problem List   Diagnosis Date Noted   Vitamin D deficiency 10/04/2020   Mediastinal adenopathy 10/04/2020   Hyponatremia    Transaminitis    Diabetes mellitus, new onset ([place]    Malnutrition of moderate degree 09/13/2020   Stroke ([place] 09/12/2020   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 [address]Phone: [phone]  Fax:  [phone] Name: LJahmil YokleyMRN: [id number] of Birth: [birth year]

## 2020-10-17 NOTE — Therapy (Signed)
[place] MAIN [facility] SERVICES [address] [place], [place], [address] Phone: [phone]   Fax:  [phone]  Occupational Therapy Treatment  Patient Details  Name: Malik Jones MRN: [id number] Date of Birth: March 14, 1960 Referring Provider (OT): Dr. Leeroy Cha   Encounter Date: 10/17/2020   OT End of Session - 10/17/20 1855     Visit Number 4    Number of Visits 24    Date for OT Re-Evaluation 12/30/20    Authorization Time Period Reporting period starting 10/08/2020    OT Start Time 0930    OT Stop Time 1015    OT Time Calculation (min) 45 min    Activity Tolerance Patient tolerated treatment well    Behavior During Therapy [facility] for tasks assessed/performed             Past Medical History:  Diagnosis Date   Medical history non-contributory     Past Surgical History:  Procedure Laterality Date   MANDIBLE FRACTURE SURGERY     NO PAST SURGERIES      There were no vitals filed for this visit.   Subjective Assessment - 10/17/20 1853     Subjective  "I've been practicing with my silverware in my R hand, but I make a mess."    Pertinent History cerebellar stroke    Limitations balance, R sided weakness and ataxia    Patient Stated Goals "I want to get back to normal."    Currently in Pain? No/denies    Pain Score 0-No pain    Multiple Pain Sites No            Occupational Therapy Treatment: Neuro re-ed: Addressed RUE grasping and reaching toward a target picking up washers and placing over vertical dowels.  Rest breaks needed to improve accuracy d/t ataxia. Picked up washers one at a time, storing in hand, and discarding 1 at time.  Difficulty with smallest washers but successful with medium and larger washers.  Focus on improving 3 point and lateral pinch strength pinching clothespins onto horiz and vertical dowels.  Used moderate resistance hand gripper x3 sets reps each for strengthening R hand.  Practiced  handwriting printing and signing name, extra time with fair legibility.  Traced straight, jagged, and curvy lines with difficulty staying on the line.  OT encouraged daily practice signing and printing name at home to enable pt to sign his checks.  Son is currently signing checks for pt.  Pt agreed.    Response to Treatment: See Plan/clinical impression below.     OT Education - 10/17/20 1854     Education Details use of RUE for self care    Person(s) Educated Patient    Methods Explanation;Demonstration;Verbal cues    Comprehension Verbalized understanding;Returned demonstration;Verbal cues required;Need further instruction              OT Short Term Goals - 10/09/20 0836       OT SHORT TERM GOAL #1   Title Pt will be indep with HEP for RUE strength/coordination    Baseline Eval: HEP initiated at eval, will continue to add on    Time 6    Period Weeks    Status New    Target Date 11/18/20               OT Long Term Goals - 10/09/20 0837       OT LONG TERM GOAL #1   Title Pt will increase FOTO score to 60  or better to indicate increased functional performance.    Baseline Eval: FOTO 52    Time 12    Period Weeks    Status New    Target Date 12/30/20      OT LONG TERM GOAL #2   Title Pt will increase R grip strength to 80 lbs or better to improve carrying heavier ADL supplies with R dominant hand.    Baseline Eval: R grip 65 lbs (L non-dominant 76 lbs)    Time 12    Period Weeks    Status New    Target Date 12/30/20      OT LONG TERM GOAL #3   Title Pt will improve ability to pick up and manipulate small ADL supplies without dropping with R dominant hand as demonstrated by improved 9 hole peg test score to <30 sec or better.    Baseline Eval: R 9 hole peg test 56.3 sec (L non-dominant 20.6 sec)    Time 12    Period Weeks    Status New    Target Date 12/30/20      OT LONG TERM GOAL #4   Title Pt will improve R GMC as demonstrated by ability to brush  teeth, shave, eat with R dominant hand.    Baseline Eval: Pt using L non-dominant hand to brush teeth, shave, eat d/t moderate ataxia throughout RUE.    Time 12    Period Weeks    Status New    Target Date 12/30/20                   Plan - 10/17/20 1929     Clinical Impression Statement Pt improving aim with reaching activities.  Pt has started to attempt use of R hand to bring food to mouth, but reports he makes a mess with attempts to use silverware, so he practices hand to mouth without food on the silverware.  OT encouraged continued daily attempts to engage RUE into self feeding, and daily handwriting practice for check writing.  Continued OT will benefit pt to maximize strength and coordination throughout RUE for improving ADL/IADL performance.    OT Occupational Profile and History Detailed Assessment- Review of Records and additional review of physical, cognitive, psychosocial history related to current functional performance    Occupational performance deficits (Please refer to evaluation for details): ADL's;IADL's;Leisure    Body Structure / Function / Physical Skills ADL;Dexterity;Strength;Balance;Coordination;FMC;IADL;Endurance;Sensation;UE functional use;GMC    Rehab Potential Excellent    Clinical Decision Making Several treatment options, min-mod task modification necessary    Comorbidities Affecting Occupational Performance: May have comorbidities impacting occupational performance    Modification or Assistance to Complete Evaluation  No modification of tasks or assist necessary to complete eval    OT Frequency 2x / week    OT Duration 12 weeks    OT Treatment/Interventions Self-care/ADL training;Therapeutic exercise;DME and/or AE instruction;Balance training;Neuromuscular education;Energy conservation;Therapeutic activities;Patient/family education;Coping strategies training    Consulted and Agree with Plan of Care Patient             Patient will benefit from  skilled therapeutic intervention in order to improve the following deficits and impairments:   Body Structure / Function / Physical Skills: ADL, Dexterity, Strength, Balance, Coordination, FMC, IADL, Endurance, Sensation, UE functional use, GMC       Visit Diagnosis: Muscle weakness (generalized)  Other lack of coordination  Cerebellar stroke [facility])    Problem List Patient Active Problem List   Diagnosis Date Noted  Vitamin D deficiency 10/04/2020   Mediastinal adenopathy 10/04/2020   Hyponatremia    Transaminitis    Diabetes mellitus, new onset ([place])    Malnutrition of moderate degree 09/13/2020   Stroke ([place]) 09/12/2020   Aortic thrombus (H[facility])    Hyperlipidemia    Essential hypertension    Impaired fasting glucose    Cerebellar stroke ([place]) 09/11/2020   Leta Speller, MS, OTR/L  Darleene Cleaver, OT/L 10/17/2020, 7:30 PM  [place] MAIN [facility] SERVICES [address] [place], [place], [address] Phone: [phone]   Fax:  [phone]  Name: Malik Jones MRN: [id number] Date of Birth: June 02, 1960

## 2020-10-20 ENCOUNTER — Other Ambulatory Visit: Payer: Self-pay | Admitting: Physical Medicine and Rehabilitation

## 2020-10-21 MED ORDER — ATORVASTATIN CALCIUM 40 MG PO TABS: 40.0000 mg | ORAL_TABLET | Freq: Every day | ORAL | 1 refills | Status: DC

## 2020-10-21 MED ORDER — APIXABAN 5 MG PO TABS: 5.0000 mg | ORAL_TABLET | Freq: Two times a day (BID) | ORAL | 0 refills | Status: DC

## 2020-10-21 MED ORDER — HYDROCHLOROTHIAZIDE 12.5 MG PO CAPS: 37.5000 mg | ORAL_CAPSULE | Freq: Every day | ORAL | 1 refills | Status: DC

## 2020-10-21 MED ORDER — AMLODIPINE BESYLATE 10 MG PO TABS: 10.0000 mg | ORAL_TABLET | Freq: Every day | ORAL | 1 refills | Status: DC

## 2020-10-21 NOTE — Telephone Encounter (Signed)
Refill request for Hydrochlorothiazide. Please advise.

## 2020-10-21 NOTE — Addendum Note (Signed)
Addended by: Jasmine December T on: 10/21/2020 01:51 PM   Modules accepted: Orders

## 2020-10-21 NOTE — Addendum Note (Signed)
Addended by: Jasmine December T on: 10/21/2020 01:50 PM   Modules accepted: Orders

## 2020-10-23 ENCOUNTER — Ambulatory Visit: Payer: BC Managed Care – PPO | Admitting: Physical Therapy

## 2020-10-23 ENCOUNTER — Ambulatory Visit: Payer: BC Managed Care – PPO

## 2020-10-23 ENCOUNTER — Other Ambulatory Visit: Payer: Self-pay

## 2020-10-23 DIAGNOSIS — R2689 Other abnormalities of gait and mobility: Secondary | ICD-10-CM

## 2020-10-23 DIAGNOSIS — I639 Cerebral infarction, unspecified: Secondary | ICD-10-CM | POA: Diagnosis not present

## 2020-10-23 DIAGNOSIS — R262 Difficulty in walking, not elsewhere classified: Secondary | ICD-10-CM | POA: Diagnosis not present

## 2020-10-23 DIAGNOSIS — R278 Other lack of coordination: Secondary | ICD-10-CM | POA: Diagnosis not present

## 2020-10-23 DIAGNOSIS — R2681 Unsteadiness on feet: Secondary | ICD-10-CM | POA: Diagnosis not present

## 2020-10-23 DIAGNOSIS — M6281 Muscle weakness (generalized): Secondary | ICD-10-CM | POA: Diagnosis not present

## 2020-10-23 DIAGNOSIS — R269 Unspecified abnormalities of gait and mobility: Secondary | ICD-10-CM | POA: Diagnosis not present

## 2020-10-23 NOTE — Therapy (Signed)
[place] MAIN [facility] SERVICES [address] [place], [place], [address] Phone: [phone]   Fax:  [phone]  Occupational Therapy Treatment  Patient Details  Name: Malik Jones MRN: [id number] Date of Birth: 09/16/1960 Referring Provider (OT): Dr. Leeroy Cha   Encounter Date: 10/23/2020   OT End of Session - 10/23/20 0920     Visit Number 5    Number of Visits 24    Date for OT Re-Evaluation 12/30/20    Authorization Time Period Reporting period starting 10/08/2020    OT Start Time 0845    OT Stop Time 0930    OT Time Calculation (min) 45 min    Equipment Utilized During Treatment RW    Activity Tolerance Patient tolerated treatment well    Behavior During Therapy [facility] for tasks assessed/performed             Past Medical History:  Diagnosis Date   Medical history non-contributory     Past Surgical History:  Procedure Laterality Date   MANDIBLE FRACTURE SURGERY     NO PAST SURGERIES      There were no vitals filed for this visit.   Subjective Assessment - 10/23/20 0919     Subjective  "Still hard for me to eat with my R hand, but I'm trying."    Pertinent History cerebellar stroke    Limitations balance, R sided weakness and ataxia    Patient Stated Goals "I want to get back to normal."    Currently in Pain? No/denies    Pain Score 0-No pain            Occupational Therapy Treatment: Neuro re-ed: Participation in [facility] activities to address RUE ataxia.  Pt placed/removed 1" pegs onto vertical pegboard holes.  Focused on speed with removing pegs with noted increase in ataxia, but no dropped pegs.  Practiced larger movements standing at white board on wall, tracing/erasing wavy, jagged, and circular lines.  Self Care: Focus on handwriting legibility completing tasks such as copying sentences, printing/signing name, constructing upper/lower case alphabet, and making lists.      Response to Treatment: See  Plan/clinical impression below.     OT Education - 10/23/20 0920     Education Details continue to practice finger foods with R hand    Person(s) Educated Patient    Methods Explanation;Demonstration;Verbal cues    Comprehension Verbalized understanding;Verbal cues required;Need further instruction              OT Short Term Goals - 10/09/20 0836       OT SHORT TERM GOAL #1   Title Pt will be indep with HEP for RUE strength/coordination    Baseline Eval: HEP initiated at eval, will continue to add on    Time 6    Period Weeks    Status New    Target Date 11/18/20               OT Long Term Goals - 10/09/20 0837       OT LONG TERM GOAL #1   Title Pt will increase FOTO score to 60 or better to indicate increased functional performance.    Baseline Eval: FOTO 52    Time 12    Period Weeks    Status New    Target Date 12/30/20      OT LONG TERM GOAL #2   Title Pt will increase R grip strength to 80 lbs or better to improve carrying heavier ADL  supplies with R dominant hand.    Baseline Eval: R grip 65 lbs (L non-dominant 76 lbs)    Time 12    Period Weeks    Status New    Target Date 12/30/20      OT LONG TERM GOAL #3   Title Pt will improve ability to pick up and manipulate small ADL supplies without dropping with R dominant hand as demonstrated by improved 9 hole peg test score to <30 sec or better.    Baseline Eval: R 9 hole peg test 56.3 sec (L non-dominant 20.6 sec)    Time 12    Period Weeks    Status New    Target Date 12/30/20      OT LONG TERM GOAL #4   Title Pt will improve R GMC as demonstrated by ability to brush teeth, shave, eat with R dominant hand.    Baseline Eval: Pt using L non-dominant hand to brush teeth, shave, eat d/t moderate ataxia throughout RUE.    Time 12    Period Weeks    Status New    Target Date 12/30/20                   Plan - 10/23/20 1033     Clinical Impression Statement Pt still reports difficulty  with self feeding using R hand, but was encouraged by OT to continue to practice with finger foods instead of utensils to reduce frustration and mess.  Pt verbalized understanding.  Pt tolerated RUE GMC/FMC activities well this day but required frequent rest breaks d/t fatigue throughout RUE.  Handwriting legibility is improving, still fair, but not yet comparable to pre-stroke legibility, per pt.    OT Occupational Profile and History Detailed Assessment- Review of Records and additional review of physical, cognitive, psychosocial history related to current functional performance    Occupational performance deficits (Please refer to evaluation for details): ADL's;IADL's;Leisure    Body Structure / Function / Physical Skills ADL;Dexterity;Strength;Balance;Coordination;FMC;IADL;Endurance;Sensation;UE functional use;GMC    Rehab Potential Excellent    Clinical Decision Making Several treatment options, min-mod task modification necessary    Comorbidities Affecting Occupational Performance: May have comorbidities impacting occupational performance    Modification or Assistance to Complete Evaluation  No modification of tasks or assist necessary to complete eval    OT Frequency 2x / week    OT Duration 12 weeks    OT Treatment/Interventions Self-care/ADL training;Therapeutic exercise;DME and/or AE instruction;Balance training;Neuromuscular education;Energy conservation;Therapeutic activities;Patient/family education;Coping strategies training    Consulted and Agree with Plan of Care Patient             Patient will benefit from skilled therapeutic intervention in order to improve the following deficits and impairments:   Body Structure / Function / Physical Skills: ADL, Dexterity, Strength, Balance, Coordination, FMC, IADL, Endurance, Sensation, UE functional use, GMC       Visit Diagnosis: Muscle weakness (generalized)  Other lack of coordination  Cerebellar stroke [facility])    Problem  List Patient Active Problem List   Diagnosis Date Noted   Vitamin D deficiency 10/04/2020   Mediastinal adenopathy 10/04/2020   Hyponatremia    Transaminitis    Diabetes mellitus, new onset ([place])    Malnutrition of moderate degree 09/13/2020   Stroke ([place]) 09/12/2020   Aortic thrombus (H[facility])    Hyperlipidemia    Essential hypertension    Impaired fasting glucose    Cerebellar stroke ([place]) 09/11/2020   Leta Speller, MS, OTR/L  Darleene Cleaver,  OT/L 10/23/2020, 10:34 AM  [place] MAIN [facility] SERVICES [address] [place], [place], [address] Phone: [phone]   Fax:  [phone]  Name: Malik Jones MRN: [id number] Date of Birth: March 05, 1960

## 2020-10-23 NOTE — Therapy (Signed)
[place] MAIN [facility] SERVICES [address] [place], [place], [address] Phone: [phone]   Fax:  [phone]  Physical Therapy Treatment  Patient Details  Name: Malik Jones MRN: [id number] Date of Birth: [birth year] No data recorded  Encounter Date: 10/23/2020   PT End of Session - 10/23/20 1329     Visit Number 5    Number of Visits 24    Date for PT Re-Evaluation 12/31/20    Authorization - Number of Visits 4    Progress Note Due on Visit 10    PT Start Time 0802    PT Stop Time 0845    PT Time Calculation (min) 43 min    Equipment Utilized During Treatment Gait belt    Activity Tolerance Patient tolerated treatment well    Behavior During Therapy WFL for tasks assessed/performed             Past Medical History:  Diagnosis Date   Medical history non-contributory     Past Surgical History:  Procedure Laterality Date   MANDIBLE FRACTURE SURGERY     NO PAST SURGERIES      There were no vitals filed for this visit.   Subjective Assessment - 10/23/20 0806     Subjective Pt reports he is well today. He has been compliant with HEP and states exercises have been going better this past week. No questions or concerns at this time.    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 Infarct occured on 09/11/20; no significant PMH. He attended inpatient rehab services at [facility] for 2 weeks and d/c from inpatient 10 days ago. He lived with his brother for 1 week before moving back into his own home. He is now living independently with abundant family check-ins. He is currently not driving or employed. He states his employer has had to post his job during this time. Pt reports 1 fall immediately after stroke, otherwise no history of falls. Only PT he has recieved has been  post-infarct. Pt arrivied with RW however states he rarely uses it and does nto use it in the home.    Limitations House hold activities;Walking;Lifting;Writing    How long can you sit comfortably? unaffected    How long can you stand comfortably? states standing is not a problem    How long can you walk comfortably? states walking is not a problem    Patient Stated Goals return to work and driving, be fully independent as he was before cerebellar infarct    Currently in Pain? No/denies             BP at start of session  148/92     TherEx:  STS, no UE support, 2 x 10 reps.; second set with jump at top of stand very challenging for RLE.    Ambulate 159f x 4 trials without AD cues for RLE foot placement, clearance and B arm swing. Last trial with horizontal head turns scanning for letters and numbers; no LOB however CGA for frequent steadying and cues for heel strike.      Standing with #3 ankle weight:   -Hip extension with BUE upper extremity support, cueing for neutral hip alignment, upright posture for optimal muscle recruitment, and sequencing, 10x each LE,  -Hip abduction with BUE upper extremity support, cueing for neutral foot alignment for correct muscle activation,  10x each LE -Hip flexion with BUE upper extremity support, cueing for body mechanics, speed of muscle recruitment for optimal strengthening and stabilization 10x each LE -Hamstring curl with BUE upper extremity support, cueing for knee alignment for recruitment of hamstring musculature, 10x each LE     Seated with #3 ankle weights  -Seated marches with upright posture, back away from back of chair for abdominal/trunk activation/stabilization, 10x each LE -Seated LAQ with 3 second holds, 10x each LE, cueing for muscle activation and sequencing for neutral alignment -Seated IR/ER with cueing for stabilizing knee placement with lateral foot movement for optimal muscle recruitment, 10x each LE    Neuro  Re-ed: -airex balance beam: lateral stepping and forward tandem stepping 6x length of airex pad for each exercise, very challenging for RLE coordination and placement   -Toe taps to hedgehog, 1 minute each LE. Difficulty with RLE coordination. VC for control and light taps with RLE. Good SLB demo on RLE during LLE taps with 1 exception, LOB requiring MIN A to correct. -Toe taps to 2 hedgehogs,  with pattern "forward-side-forward-neutral stance" for increased coordination challenge; x12 reps each side. Improved coordination with increased reps.     Pt educated throughout session about proper posture and technique with exercises. Improved exercise technique, movement at target joints, use of target muscles after min to mod verbal, visual, tactile cues.     Patient pleasant and highly motivated throughout physical therapy session. RLE is very limited with spatial awareness and coordinated movements. Increased RLE engagement during last 5 reps of jumping activity. Lateral drift in bilateral directions during ambulation with head turns, CGA for stability however no LOB. Strengthening of RLE tolerated well with occasional rest breaks. Pt will benefit from skilled PT services to address deficits in gait, coordination and balance for improved quality of life, to return to work/driving and decrease risk for future falls.          PT Short Term Goals - 10/08/20 1713       PT SHORT TERM GOAL #1   Title Patient will be independent in home exercise program to improve strength/mobility for better functional independence with ADLs.    Time 6    Period Weeks    Status New    Target Date 11/19/20               PT Long Term Goals - 10/15/20 1021       PT LONG TERM GOAL #1   Title Patient will increase FOTO score to equal to or greater than 77 to demonstrate statistically significant improvement in mobility and quality of life.    Baseline 10/08/20: 63    Time 12    Period Weeks    Status New       PT LONG TERM GOAL #2   Title Pt will improve BERG by at least 3 points in order to demonstrate clinically significant improvement in balance.    Baseline 42 10/11/20    Time 8    Period Weeks    Status New      PT LONG TERM GOAL #3   Title Patient will reduce timed up and go to <11 seconds to reduce fall risk and demonstrate improved transfer/gait ability.    Baseline 10/08/20: 12.68 seconds;    Time 12    Period Weeks    Status New      PT LONG TERM GOAL #4   Title Patient will increase six minute walk test distance to >1000  for progression to community ambulator and improve gait ability    Baseline 10/08/20: 947f    Time 12    Period Weeks    Status New      PT LONG TERM GOAL #5   Title Patient will increase Functional Gait Assessment score to >20/30 as to reduce fall risk and improve dynamic gait safety with community ambulation.    Baseline 10/15/20: 14/30    Time 12    Period Weeks    Status New    Target Date 12/31/20                   Plan - 10/23/20 1330     Clinical Impression Statement Patient pleasant and highly motivated throughout physical therapy session. RLE is very limited with spatial awareness and coordinated movements. Increased RLE engagement during last 5 reps of jumping activity. Lateral drift in bilateral directions during ambulation with head turns, CGA for stability however no LOB. Strengthening of RLE tolerated well with occasional rest breaks. Pt will benefit from skilled PT services to address deficits in gait, coordination and balance for improved quality of life, to return to work/driving and decrease risk for future falls.    Personal Factors and Comorbidities Age;Comorbidity 1;Comorbidity 2    Comorbidities HTN , DM    Examination-Activity Limitations Locomotion Level;Transfers;Stairs    Examination-Participation Restrictions Driving;Occupation;Community Activity;Yard Work    Stability/Clinical Decision Making Stable/Uncomplicated     Rehab Potential Excellent    PT Frequency 2x / week    PT Duration 12 weeks    PT Treatment/Interventions ADLs/Self Care Home Management;Aquatic Therapy;Biofeedback;Cryotherapy;Electrical Stimulation;Moist Heat;Traction;Ultrasound;Gait training;DME Instruction;Stair training;Functional mobility training;Therapeutic activities;Neuromuscular re-education;Balance training;Therapeutic exercise;Patient/family education;Manual techniques    PT Next Visit Plan begin dynamc gait training activity, continue with LE strengthening program    PT Home Exercise Plan Provided Access Code: [email or web address] URL: [email or web address]  Date: 10/11/2020  Prepared by: CRivka Barbara   Consulted and Agree with Plan of Care Patient             Patient will benefit from skilled therapeutic intervention in order to improve the following deficits and impairments:  Abnormal gait, Decreased activity tolerance, Decreased endurance, Impaired sensation, Improper body mechanics, Decreased balance, Decreased coordination, Decreased mobility, Decreased safety awareness, Difficulty walking  Visit Diagnosis: Muscle weakness (generalized)  Other lack of coordination  Cerebellar stroke (HCC)  Other abnormalities of gait and mobility  Unsteadiness on feet  Difficulty in walking, not elsewhere classified     Problem List Patient Active Problem List   Diagnosis Date Noted   Vitamin D deficiency 10/04/2020   Mediastinal adenopathy 10/04/2020   Hyponatremia    Transaminitis    Diabetes mellitus, new onset ([place]    Malnutrition of moderate degree 09/13/2020   Stroke ([facility] 09/12/2020   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

## 2020-10-25 ENCOUNTER — Other Ambulatory Visit: Payer: Self-pay

## 2020-10-25 ENCOUNTER — Ambulatory Visit: Payer: BC Managed Care – PPO | Admitting: Physical Therapy

## 2020-10-25 ENCOUNTER — Ambulatory Visit: Payer: BC Managed Care – PPO

## 2020-10-25 DIAGNOSIS — R2681 Unsteadiness on feet: Secondary | ICD-10-CM

## 2020-10-25 DIAGNOSIS — R262 Difficulty in walking, not elsewhere classified: Secondary | ICD-10-CM

## 2020-10-25 DIAGNOSIS — R2689 Other abnormalities of gait and mobility: Secondary | ICD-10-CM | POA: Diagnosis not present

## 2020-10-25 DIAGNOSIS — M6281 Muscle weakness (generalized): Secondary | ICD-10-CM | POA: Diagnosis not present

## 2020-10-25 DIAGNOSIS — R278 Other lack of coordination: Secondary | ICD-10-CM | POA: Diagnosis not present

## 2020-10-25 DIAGNOSIS — I639 Cerebral infarction, unspecified: Secondary | ICD-10-CM

## 2020-10-25 DIAGNOSIS — R269 Unspecified abnormalities of gait and mobility: Secondary | ICD-10-CM | POA: Diagnosis not present

## 2020-10-25 NOTE — Therapy (Signed)
[place] MAIN [facility] SERVICES [address] [place], [place], [address] Phone: [phone]   Fax:  [phone]  Occupational Therapy Treatment  Patient Details  Name: Malik Jones MRN: [id number] Date of Birth: 1960/10/11 Referring Provider (OT): Dr. Leeroy Cha   Encounter Date: 10/25/2020   OT End of Session - 10/25/20 1033     Visit Number 6    Number of Visits 24    Date for OT Re-Evaluation 12/30/20    Authorization Time Period Reporting period starting 10/08/2020    OT Start Time 0930    OT Stop Time 1015    OT Time Calculation (min) 45 min    Equipment Utilized During Treatment RW    Activity Tolerance Patient tolerated treatment well    Behavior During Therapy [facility] for tasks assessed/performed             Past Medical History:  Diagnosis Date   Medical history non-contributory     Past Surgical History:  Procedure Laterality Date   MANDIBLE FRACTURE SURGERY     NO PAST SURGERIES      There were no vitals filed for this visit.   Subjective Assessment - 10/25/20 1032     Subjective  "I think I'm going to mow the lawn this weekend."    Pertinent History cerebellar stroke    Limitations balance, R sided weakness and ataxia    Patient Stated Goals "I want to get back to normal."    Currently in Pain? No/denies    Pain Score 0-No pain    Multiple Pain Sites No            Occupational Therapy Treatment: Neuro re-ed: Participation in GMC/FMC activities for reducing ataxia throughout RUE.  Used therapy resistant clothespins to clip onto 1" targets on horiztonal and vertical yardstick with RUE.  Used [employer]ue pegboard to address fingertip dexterity with picking up pieces, assembling, and disassembling pieces.  Stood at whiteboard to participate to challenge larger/gross motor movements tracing large lines throughout whiteboard above and below shoulder level, placing dots and lines at various targets, drawing circles  around dotted targets.  Fairly good accuracy with slow pace, rest breaks needed d/t RUE fatigue.   Self Care: Participation in paper/pencil table top task working on tracing complex shapes, then printing name (3 repetitions), signing name (6 repetitions).  R hand fatigue upon completion, but improving.  Pt planning to mow lawn this weekend with push mower.  Reviewed strategies for filling gas into mower with encouragement to sit and bear forearms into thighs while pouring from gas can into funnel to improve stability of BUEs and reduce ataxia through weight bearing.  Pt verbalized understanding and receptive to recommendations.  Pt reports no hills in yard and will take his time.      Response to Treatment: See Plan/clinical impression below.     OT Education - 10/25/20 1033     Education Details ADL strategies for stabilizing RUE to minimize ataxia during functional activities    Person(s) Educated Patient    Methods Explanation;Demonstration;Verbal cues    Comprehension Verbalized understanding;Verbal cues required;Need further instruction              OT Short Term Goals - 10/09/20 0836       OT SHORT TERM GOAL #1   Title Pt will be indep with HEP for RUE strength/coordination    Baseline Eval: HEP initiated at eval, will continue to add on    Time 6  Period Weeks    Status New    Target Date 11/18/20               OT Long Term Goals - 10/09/20 0837       OT LONG TERM GOAL #1   Title Pt will increase FOTO score to 60 or better to indicate increased functional performance.    Baseline Eval: FOTO 52    Time 12    Period Weeks    Status New    Target Date 12/30/20      OT LONG TERM GOAL #2   Title Pt will increase R grip strength to 80 lbs or better to improve carrying heavier ADL supplies with R dominant hand.    Baseline Eval: R grip 65 lbs (L non-dominant 76 lbs)    Time 12    Period Weeks    Status New    Target Date 12/30/20      OT LONG TERM GOAL #3    Title Pt will improve ability to pick up and manipulate small ADL supplies without dropping with R dominant hand as demonstrated by improved 9 hole peg test score to <30 sec or better.    Baseline Eval: R 9 hole peg test 56.3 sec (L non-dominant 20.6 sec)    Time 12    Period Weeks    Status New    Target Date 12/30/20      OT LONG TERM GOAL #4   Title Pt will improve R GMC as demonstrated by ability to brush teeth, shave, eat with R dominant hand.    Baseline Eval: Pt using L non-dominant hand to brush teeth, shave, eat d/t moderate ataxia throughout RUE.    Time 12    Period Weeks    Status New    Target Date 12/30/20              Plan - 10/25/20 1050     Clinical Impression Statement Handwriting continues to show improvement in legibility as compared to 10/18/2020 trial.  Pt reports he's consistently self feeding with RUE for finger foods, but when using a utensil, he'll try to hold the utensil with his R hand, but when food is nearing mouth, he typically has to hold R wrist still using L hand.  Pt will benefit from continued Occupational Therapy to address RUE ataxia in order to maximize indep with ADLs/IADLs/work/leisure.    OT Occupational Profile and History Detailed Assessment- Review of Records and additional review of physical, cognitive, psychosocial history related to current functional performance    Occupational performance deficits (Please refer to evaluation for details): ADL's;IADL's;Leisure    Body Structure / Function / Physical Skills ADL;Dexterity;Strength;Balance;Coordination;FMC;IADL;Endurance;Sensation;UE functional use;GMC    Rehab Potential Excellent    Clinical Decision Making Several treatment options, min-mod task modification necessary    Comorbidities Affecting Occupational Performance: May have comorbidities impacting occupational performance    Modification or Assistance to Complete Evaluation  No modification of tasks or assist necessary to complete  eval    OT Frequency 2x / week    OT Duration 12 weeks    OT Treatment/Interventions Self-care/ADL training;Therapeutic exercise;DME and/or AE instruction;Balance training;Neuromuscular education;Energy conservation;Therapeutic activities;Patient/family education;Coping strategies training    Consulted and Agree with Plan of Care Patient             Patient will benefit from skilled therapeutic intervention in order to improve the following deficits and impairments:   Body Structure / Function / Physical Skills: ADL, Dexterity,  Strength, Balance, Coordination, FMC, IADL, Endurance, Sensation, UE functional use, [place]       Visit Diagnosis: Other lack of coordination  Muscle weakness (generalized)  Cerebellar stroke [facility])    Problem List Patient Active Problem List   Diagnosis Date Noted   Vitamin D deficiency 10/04/2020   Mediastinal adenopathy 10/04/2020   Hyponatremia    Transaminitis    Diabetes mellitus, new onset ([place])    Malnutrition of moderate degree 09/13/2020   Stroke ([place]) 09/12/2020   Aortic thrombus (HCC)    Hyperlipidemia    Essential hypertension    Impaired fasting glucose    Cerebellar stroke ([place]) 09/11/2020   Leta Speller, MS, OTR/L  Darleene Cleaver, OT/L 10/25/2020, 10:54 AM  [place], [place], [address] Phone: [phone]   Fax:  [phone]  Name: Jayleen Joas MRN: [id number] Date of Birth: 1961-01-25

## 2020-10-25 NOTE — Therapy (Signed)
[place] MAIN [facility] SERVICES [address] [place], [place], [address] Phone: [phone]   Fax:  [phone]  Physical Therapy Treatment  Patient Details  Name: Malik Jones MRN: [id number] Date of Birth: Mar 22, 1960 No data recorded  Encounter Date: 10/25/2020   PT End of Session - 10/25/20 0908     Visit Number 6    Number of Visits 24    Date for PT Re-Evaluation 12/31/20    Authorization - Visit Number 4    Progress Note Due on Visit 10    PT Start Time 0848    PT Stop Time 0929    PT Time Calculation (min) 41 min    Equipment Utilized During Treatment Gait belt    Activity Tolerance Patient tolerated treatment well    Behavior During Therapy WFL for tasks assessed/performed             Past Medical History:  Diagnosis Date   Medical history non-contributory     Past Surgical History:  Procedure Laterality Date   MANDIBLE FRACTURE SURGERY     NO PAST SURGERIES       (See VS as listed later in note)    Subjective Assessment - 10/25/20 0852     Subjective Pt reports he is well today. He has been compliant with HEP and states exercises have been going better this past week. Reports he has tried min driving activities just around neighborhood.    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 Infarct occured on 09/11/20; no significant PMH. He attended inpatient rehab services at [facility] for 2 weeks and d/c from inpatient 10 days ago. He lived with his brother for 1 week before moving back into his own home. He is now living independently with abundant family check-ins. He is currently not driving or employed. He states his employer has had to post his job during this time. Pt reports 1 fall immediately after stroke, otherwise no history of falls. Only PT he has recieved  has been post-infarct. Pt arrivied with RW however states he rarely uses it and does nto use it in the home.    Limitations House hold activities;Walking;Lifting;Writing    How long can you sit comfortably? unaffected    How long can you stand comfortably? states standing is not a problem    How long can you walk comfortably? states walking is not a problem    Currently in Pain? No/denies    Pain Score 0-No pain    Multiple Pain Sites No               Treatment provided this session  BP at start of session  140/80     TherEx:  STS, no UE support, 2 x 10 reps.; second set with jump at top of stand very challenging for RLE.    Ambulate 340f x 2 trials without AD cues for RLE foot placement, clearance and B arm swing. Added horizontal head turna and vertical head nods as well as retro walking and pt had no LOB but did demonstrate alteration in gait pattern with head turns with decreased speed and some deviation from straight path. On second set added eyes closed walking and pt demonstrated good gait pattern with cues.     The following activities were completed in parallel bars or at balance bar   Standing  with #3 ankle weight:   -Hip extension with BUE upper extremity support, cueing for neutral hip alignment, and sequencing, 10x each LE,  -Hip abduction with BUE upper extremity support, cueing for neutral foot alignment for correct muscle activation, 10x each LE -Hip flexion with BUE upper extremity support, cueing for body mechanics, speed of muscle recruitment for optimal strengthening and stabilization 10x each LE -Hamstring curl with BUE upper extremity support, cueing for knee alignment for recruitment of hamstring musculature, 10x each LE -min cueing required for proper muscle activation today as pt demonstrated improved efficacy with tasks   -marching x 10 each,cues for proper eccentric control   Seated with #3 ankle weights  -Seated LAQ with 3 second holds, 10x each LE,  cues for proper hold times for proper muscle activation     Neuro Re-ed:  The following activities were completed in parallel bars or at balance bar   -airex balance beam: lateral stepping and forward tandem stepping 6x length of airex pad for each exercise, very challenging for RLE coordination and placement -turn around on airex pad at each end of balance beam for forward stepping portion of above exercise  -Moderate use of hands on first couple repetitions but with increased repetitions patient required less handhold assistance on parallel bars.  On airex pad   -Toe taps to 2 hedgehogs,  with pattern "Right, left back" for increased coordination challenge; x10 reps each side.     Pt educated throughout session about proper posture and technique with exercises. Improved exercise technique, movement at target joints, use of target muscles after min to mod verbal, visual, tactile cues.    Pt required occasional rest breaks due fatigue, PT was quick to ask when pt appeared to be fatiguing in order to prevent excessive fatigue.  Patient also provided with water from water fountain in physical therapy gym via cup on 2 occasions during session.                      PT Education - 10/25/20 0907     Education Details exercise technique    Person(s) Educated Patient    Methods Explanation;Demonstration;Tactile cues    Comprehension Verbalized understanding;Returned demonstration;Verbal cues required;Need further instruction              PT Short Term Goals - 10/08/20 1713       PT SHORT TERM GOAL #1   Title Patient will be independent in home exercise program to improve strength/mobility for better functional independence with ADLs.    Time 6    Period Weeks    Status New    Target Date 11/19/20               PT Long Term Goals - 10/15/20 1021       PT LONG TERM GOAL #1   Title Patient will increase FOTO score to equal to or greater than 77 to  demonstrate statistically significant improvement in mobility and quality of life.    Baseline 10/08/20: 63    Time 12    Period Weeks    Status New      PT LONG TERM GOAL #2   Title Pt will improve BERG by at least 3 points in order to demonstrate clinically significant improvement in balance.    Baseline 42 10/11/20    Time 8    Period Weeks    Status New      PT LONG TERM GOAL #3  Title Patient will reduce timed up and go to <11 seconds to reduce fall risk and demonstrate improved transfer/gait ability.    Baseline 10/08/20: 12.68 seconds;    Time 12    Period Weeks    Status New      PT LONG TERM GOAL #4   Title Patient will increase six minute walk test distance to >1000 for progression to community ambulator and improve gait ability    Baseline 10/08/20: 950f    Time 12    Period Weeks    Status New      PT LONG TERM GOAL #5   Title Patient will increase Functional Gait Assessment score to >20/30 as to reduce fall risk and improve dynamic gait safety with community ambulation.    Baseline 10/15/20: 14/30    Time 12    Period Weeks    Status New    Target Date 12/31/20                   Plan - 10/25/20 0910     Clinical Impression Statement Patient demonstrated good motivation as well as good performance of therapeutic exercise and neuromuscular education activities today.  With ambulatory activities patient had most difficulty with head turns and narrow base of support ambulation.  Patient demonstrated good ability to ambulate with eyes closed as well as good ability to retro-ambulate.  Patient had some difficulty with Airex balance beam ambulation and with turns on Airex pad.  Patient demonstrated good ability to sidestep on Airex but required some cues to pick feet up.  She will continue to do benefit from skilled physical therapy intervention in order to improve his ambulatory capacity without fatigue improve his balance and improve his quality of life.     Personal Factors and Comorbidities Age;Comorbidity 1;Comorbidity 2    Comorbidities HTN , DM    Examination-Activity Limitations Locomotion Level;Transfers;Stairs    Examination-Participation Restrictions Driving;Occupation;Community Activity;Yard Work    Stability/Clinical Decision Making Stable/Uncomplicated    Rehab Potential Excellent    PT Frequency 2x / week    PT Duration 12 weeks    PT Treatment/Interventions ADLs/Self Care Home Management;Aquatic Therapy;Biofeedback;Cryotherapy;Electrical Stimulation;Moist Heat;Traction;Ultrasound;Gait training;DME Instruction;Stair training;Functional mobility training;Therapeutic activities;Neuromuscular re-education;Balance training;Therapeutic exercise;Patient/family education;Manual techniques    PT Next Visit Plan begin dynamc gait training activity, continue with LE strengthening program    PT Home Exercise Plan Provided Access Code: [email or web address] URL: [email or web address]  Date: 10/11/2020  Prepared by: CRivka Barbara   Consulted and Agree with Plan of Care Patient             Patient will benefit from skilled therapeutic intervention in order to improve the following deficits and impairments:  Abnormal gait, Decreased activity tolerance, Decreased endurance, Impaired sensation, Improper body mechanics, Decreased balance, Decreased coordination, Decreased mobility, Decreased safety awareness, Difficulty walking  Visit Diagnosis: Other abnormalities of gait and mobility  Unsteadiness on feet  Difficulty in walking, not elsewhere classified  Muscle weakness (generalized)     Problem List Patient Active Problem List   Diagnosis Date Noted   Vitamin D deficiency 10/04/2020   Mediastinal adenopathy 10/04/2020   Hyponatremia    Transaminitis    Diabetes mellitus, new onset ([place]    Malnutrition of moderate degree 09/13/2020   Stroke ([place] 09/12/2020   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 [place], [place], [address] Phone: [phone]   Fax:  [phone]  Name: Malik Jones MRN: [id number] Date of Birth: 28-Aug-1960

## 2020-10-29 ENCOUNTER — Ambulatory Visit: Payer: BC Managed Care – PPO

## 2020-10-29 ENCOUNTER — Ambulatory Visit: Payer: BC Managed Care – PPO | Admitting: Physical Therapy

## 2020-10-29 ENCOUNTER — Other Ambulatory Visit: Payer: Self-pay

## 2020-10-29 DIAGNOSIS — R278 Other lack of coordination: Secondary | ICD-10-CM

## 2020-10-29 DIAGNOSIS — M6281 Muscle weakness (generalized): Secondary | ICD-10-CM

## 2020-10-29 DIAGNOSIS — R262 Difficulty in walking, not elsewhere classified: Secondary | ICD-10-CM

## 2020-10-29 DIAGNOSIS — R269 Unspecified abnormalities of gait and mobility: Secondary | ICD-10-CM | POA: Diagnosis not present

## 2020-10-29 DIAGNOSIS — I639 Cerebral infarction, unspecified: Secondary | ICD-10-CM

## 2020-10-29 DIAGNOSIS — R2681 Unsteadiness on feet: Secondary | ICD-10-CM

## 2020-10-29 DIAGNOSIS — R2689 Other abnormalities of gait and mobility: Secondary | ICD-10-CM | POA: Diagnosis not present

## 2020-10-29 NOTE — Therapy (Signed)
[place] MAIN [facility] SERVICES [address] [place], [place], [address] Phone: [phone]   Fax:  [phone]  Physical Therapy Treatment  Patient Details  Name: Malik Jones MRN: [id number] Date of Birth: [birth year] No data recorded  Encounter Date: 10/29/2020   PT End of Session - 10/29/20 0901     Visit Number 7    Number of Visits 24    Date for PT Re-Evaluation 12/31/20    Authorization - Visit Number 5    Progress Note Due on Visit 10    PT Start Time 0853    PT Stop Time 0931    PT Time Calculation (min) 38 min    Equipment Utilized During Treatment Gait belt    Activity Tolerance Patient tolerated treatment well    Behavior During Therapy WFL for tasks assessed/performed             Past Medical History:  Diagnosis Date   Medical history non-contributory     Past Surgical History:  Procedure Laterality Date   MANDIBLE FRACTURE SURGERY     NO PAST SURGERIES      There were no vitals filed for this visit.   Subjective Assessment - 10/29/20 0856     Subjective Pt reports he is well today. He has been compliant with HEP and states exercises have been going better this past week. Reports he has tried min driving activities just around neighborhood.    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 Infarct occured on 09/11/20; no significant PMH. He attended inpatient rehab services at [facility] for 2 weeks and d/c from inpatient 10 days ago. He lived with his brother for 1 week before moving back into his own home. He is now living independently with abundant family check-ins. He is currently not driving or employed. He states his employer has had to post his job during this time. Pt reports 1 fall immediately after stroke, otherwise no history of falls. Only PT he has  recieved has been post-infarct. Pt arrivied with RW however states he rarely uses it and does nto use it in the home.    Limitations House hold activities;Walking;Lifting;Writing    How long can you sit comfortably? unaffected    How long can you stand comfortably? states standing is not a problem    How long can you walk comfortably? states walking is not a problem    Patient Stated Goals return to work and driving, be fully independent as he was before cerebellar infarct    Currently in Pain? No/denies    Pain Score 0-No pain    Multiple Pain Sites No            Note: Portions of this document were prepared using Dragon voice recognition software and although reviewed may contain unintentional dictation errors in syntax, grammar, or spelling.   Treatment provided this session  Therex:  Standing 3# ankle weight donned for following exercises:  Hip ABD 2 x 10 bilaterally  -cues for hip positioning  Standing hip ABD  -cues for trunk control without forward lean  Heel raises and toe raises 2 x 15  -cues to prevent anterior trunk lean with toe raises in order to ensure proper muscle activation       Neuro Re- Ed:   Lateral stepping onto Airex to each side.  X10 -Increase  difficulty noted with transition from left to right with increased difficulty with right lower extremity placement onto Airex pad. Anterior and anterolateral hedgehog taps.  X10. -Cues for anterior to lateral followed bilateral to anterior for increased challenge with lower extremity placement and coordination.  Demonstrated impaired ability for right lower extremity placement particularly with bilateral stepping.  With increased practice patient improved ability to touch hedgehog without over her understanding.  Lateral weight shift with left lower extremity on DynaDisc and right lower extremity on Airex pad.  X12 to each side.  Patient demonstrated good ability to maintain balance but noticed some fatigue and  left lower extremity at the DynaDisc.    There Act: Ambulation: 2 rounds of 300 feet of ambulation (2 min) and 1 min rest between sets - trial with looking left and right and reading letters and numbers as directed and posted on walls.  -trial with increasing ambulation speed  and slowing as directed -STS 1 x 12 with arms across chest  STS with jump at top x 10   Donned 3# ankle weights bilaterally for the following exercises Standing:  Marching: 2 x 10  -to assist with foot clearance with ambulatory tasks         Unless otherwise stated, CGA was provided and gait belt donned in order to ensure pt safety   Pt educated throughout session about proper posture and technique with exercises. Improved exercise technique, movement at target joints, use of target muscles after min to mod verbal, visual, tactile cues.                           PT Education - 10/29/20 0857     Education Details educated to monitor BG appropriatly and as instructe per MD    Person(s) Educated Patient    Methods Explanation    Comprehension Verbalized understanding              PT Short Term Goals - 10/08/20 1713       PT SHORT TERM GOAL #1   Title Patient will be independent in home exercise program to improve strength/mobility for better functional independence with ADLs.    Time 6    Period Weeks    Status New    Target Date 11/19/20               PT Long Term Goals - 10/15/20 1021       PT LONG TERM GOAL #1   Title Patient will increase FOTO score to equal to or greater than 77 to demonstrate statistically significant improvement in mobility and quality of life.    Baseline 10/08/20: 63    Time 12    Period Weeks    Status New      PT LONG TERM GOAL #2   Title Pt will improve BERG by at least 3 points in order to demonstrate clinically significant improvement in balance.    Baseline 42 10/11/20    Time 8    Period Weeks    Status New      PT LONG  TERM GOAL #3   Title Patient will reduce timed up and go to <11 seconds to reduce fall risk and demonstrate improved transfer/gait ability.    Baseline 10/08/20: 12.68 seconds;    Time 12    Period Weeks    Status New      PT LONG TERM GOAL #4   Title Patient will increase six  minute walk test distance to >1000 for progression to community ambulator and improve gait ability    Baseline 10/08/20: 937ft    Time 12    Period Weeks    Status New      PT LONG TERM GOAL #5   Title Patient will increase Functional Gait Assessment score to >20/30 as to reduce fall risk and improve dynamic gait safety with community ambulation.    Baseline 10/15/20: 14/30    Time 12    Period Weeks    Status New    Target Date 12/31/20                   Plan - 10/29/20 0910     Clinical Impression Statement Patient responded well to today's physical therapy treatment session.  Patient displayed improved tolerance for ambulation without an assistive device and was able to walk perform head turns and attention to other objects.  On occasion patient demonstrated decreased awareness toward objects on his right side.  This may be due to some neglect or could just be due to to increase attention to other objects in his surroundings.  Patient continues to display improved ability to perform sit to stand and transfers displays good gait pattern.  Patient does have some difficulty with stepping up to his right side and with lower extremity coordination on the right side.  Patient will continue to benefit from skilled physical therapy intervention to address his impairments and improve his overall quality of life.    Personal Factors and Comorbidities Age;Comorbidity 1;Comorbidity 2    Comorbidities HTN , DM    Examination-Activity Limitations Locomotion Level;Transfers;Stairs    Examination-Participation Restrictions Driving;Occupation;Community Activity;Yard Work    Stability/Clinical Decision Making  Stable/Uncomplicated    Rehab Potential Excellent    PT Frequency 2x / week    PT Duration 12 weeks    PT Treatment/Interventions ADLs/Self Care Home Management;Aquatic Therapy;Biofeedback;Cryotherapy;Electrical Stimulation;Moist Heat;Traction;Ultrasound;Gait training;DME Instruction;Stair training;Functional mobility training;Therapeutic activities;Neuromuscular re-education;Balance training;Therapeutic exercise;Patient/family education;Manual techniques    PT Next Visit Plan begin dynamc gait training activity, continue with LE strengthening program    PT Home Exercise Plan Provided Access Code: [email or web address]  URL: [email or web address]  Date: 10/11/2020  Prepared by: Rivka Barbara    Consulted and Agree with Plan of Care Patient             Patient will benefit from skilled therapeutic intervention in order to improve the following deficits and impairments:  Abnormal gait, Decreased activity tolerance, Decreased endurance, Impaired sensation, Improper body mechanics, Decreased balance, Decreased coordination, Decreased mobility, Decreased safety awareness, Difficulty walking  Visit Diagnosis: Other abnormalities of gait and mobility  Unsteadiness on feet  Difficulty in walking, not elsewhere classified  Muscle weakness (generalized)     Problem List Patient Active Problem List   Diagnosis Date Noted   Vitamin D deficiency 10/04/2020   Mediastinal adenopathy 10/04/2020   Hyponatremia    Transaminitis    Diabetes mellitus, new onset ([place])    Malnutrition of moderate degree 09/13/2020   Stroke ([place]) 09/12/2020   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 [place], [place], [address] Phone: [phone]   Fax:  [phone]  Name: Malik Jones MRN: [id number] Date of Birth: 20-Dec-1960

## 2020-10-29 NOTE — Therapy (Signed)
[place] MAIN [facility] SERVICES [address] [place], [place], [address] Phone: [phone]   Fax:  [phone]  Occupational Therapy Treatment  Patient Details  Name: Malik Jones MRN: [id number] Date of Birth: [birth year] Referring Provider (OT): Dr. Leeroy Cha   Encounter Date: 10/29/2020   OT End of Session - 10/29/20 1338     Visit Number 7    Number of Visits 24    Date for OT Re-Evaluation 12/30/20    Authorization Time Period Reporting period starting 10/08/2020    OT Start Time 0930    OT Stop Time 1015    OT Time Calculation (min) 45 min    Equipment Utilized During Treatment RW    Activity Tolerance Patient tolerated treatment well    Behavior During Therapy [facility] for tasks assessed/performed             Past Medical History:  Diagnosis Date   Medical history non-contributory     Past Surgical History:  Procedure Laterality Date   MANDIBLE FRACTURE SURGERY     NO PAST SURGERIES      There were no vitals filed for this visit.   Subjective Assessment - 10/29/20 1336     Subjective  "I have to take my time if I try and use my R hand to eat.  My hand gets shaky when I get close to my mouth."    Pertinent History cerebellar stroke    Limitations balance, R sided weakness and ataxia    Patient Stated Goals "I want to get back to normal."    Currently in Pain? No/denies    Pain Score 0-No pain    Multiple Pain Sites No           Occupational Therapy Treatment: Self Care: Used RUE to take down and put up hangers onto rod above shoulder level using RUE with only mild ataxia noted.  Education given on benefits of weighted utensils to increase control of silverware d/t ataxia in RUE and advised on options to obtain utensils if desired.  Pt practiced with weighted spoon to scoop small stones from a dish and performed hand to near mouth patterns.  Pt felt weighted spoon was effective for better control of spoon vs his  standard spoon at home, though still mild shaking of R hand when nearing mouth, especially when arm is fatigued after multiple repetitions.   Neuro re-ed: Addressed [facility] with picking up small washers from table top, transferring them from palm to fingertips and placed over vertical dowel.  Graded this activity to increase the challenge by picking up washer with 2 point pinch and holding the rim of the washer between tips of thumb and IF and sliding directly over top of dowel.  Pt had several dropped washer using this pinch.    Response to Treatment: See Plan/clinical impression below.       OT Education - 10/29/20 1337     Education Details benefits of weighted eating utensils and options to obtain    Person(s) Educated Patient    Methods Explanation;Demonstration;Verbal cues    Comprehension Verbalized understanding;Verbal cues required;Need further instruction;Returned demonstration              OT Short Term Goals - 10/09/20 0836       OT SHORT TERM GOAL #1   Title Pt will be indep with HEP for RUE strength/coordination    Baseline Eval: HEP initiated at eval, will continue to add on  Time 6    Period Weeks    Status New    Target Date 11/18/20               OT Long Term Goals - 10/09/20 0837       OT LONG TERM GOAL #1   Title Pt will increase FOTO score to 60 or better to indicate increased functional performance.    Baseline Eval: FOTO 52    Time 12    Period Weeks    Status New    Target Date 12/30/20      OT LONG TERM GOAL #2   Title Pt will increase R grip strength to 80 lbs or better to improve carrying heavier ADL supplies with R dominant hand.    Baseline Eval: R grip 65 lbs (L non-dominant 76 lbs)    Time 12    Period Weeks    Status New    Target Date 12/30/20      OT LONG TERM GOAL #3   Title Pt will improve ability to pick up and manipulate small ADL supplies without dropping with R dominant hand as demonstrated by improved 9 hole peg  test score to <30 sec or better.    Baseline Eval: R 9 hole peg test 56.3 sec (L non-dominant 20.6 sec)    Time 12    Period Weeks    Status New    Target Date 12/30/20      OT LONG TERM GOAL #4   Title Pt will improve R GMC as demonstrated by ability to brush teeth, shave, eat with R dominant hand.    Baseline Eval: Pt using L non-dominant hand to brush teeth, shave, eat d/t moderate ataxia throughout RUE.    Time 12    Period Weeks    Status New    Target Date 12/30/20               Plan - 10/29/20 1351     Clinical Impression Statement Pt is progressing well with picking up small objects and moving them within palm to tips of fingers, as well as improving use of R hand for self feeding.  Pt continues to improve RUE [facility] for self care tasks, but RUE ataxia and weakness continue to limit efficiency and accuracy when engaging RUE into self care.    OT Occupational Profile and History Detailed Assessment- Review of Records and additional review of physical, cognitive, psychosocial history related to current functional performance    Occupational performance deficits (Please refer to evaluation for details): ADL's;IADL's;Leisure    Body Structure / Function / Physical Skills ADL;Dexterity;Strength;Balance;Coordination;FMC;IADL;Endurance;Sensation;UE functional use;GMC    Rehab Potential Excellent    Clinical Decision Making Several treatment options, min-mod task modification necessary    Comorbidities Affecting Occupational Performance: May have comorbidities impacting occupational performance    Modification or Assistance to Complete Evaluation  No modification of tasks or assist necessary to complete eval    OT Frequency 2x / week    OT Duration 12 weeks    OT Treatment/Interventions Self-care/ADL training;Therapeutic exercise;DME and/or AE instruction;Balance training;Neuromuscular education;Energy conservation;Therapeutic activities;Patient/family education;Coping  strategies training    Consulted and Agree with Plan of Care Patient             Patient will benefit from skilled therapeutic intervention in order to improve the following deficits and impairments:   Body Structure / Function / Physical Skills: ADL, Dexterity, Strength, Balance, Coordination, FMC, IADL, Endurance, Sensation, UE functional use,  [place]       Visit Diagnosis: Muscle weakness (generalized)  Other lack of coordination  Cerebellar stroke [facility])    Problem List Patient Active Problem List   Diagnosis Date Noted   Vitamin D deficiency 10/04/2020   Mediastinal adenopathy 10/04/2020   Hyponatremia    Transaminitis    Diabetes mellitus, new onset ([place])    Malnutrition of moderate degree 09/13/2020   Stroke ([place]) 09/12/2020   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 [place], [place], [address] Phone: [phone]   Fax:  [phone]  Name: Maxmilian Trostel MRN: [id number] Date of Birth: [birth year]

## 2020-10-31 ENCOUNTER — Other Ambulatory Visit: Payer: Self-pay

## 2020-10-31 ENCOUNTER — Ambulatory Visit: Payer: BC Managed Care – PPO

## 2020-10-31 DIAGNOSIS — I639 Cerebral infarction, unspecified: Secondary | ICD-10-CM | POA: Diagnosis not present

## 2020-10-31 DIAGNOSIS — R2681 Unsteadiness on feet: Secondary | ICD-10-CM | POA: Diagnosis not present

## 2020-10-31 DIAGNOSIS — R278 Other lack of coordination: Secondary | ICD-10-CM

## 2020-10-31 DIAGNOSIS — M6281 Muscle weakness (generalized): Secondary | ICD-10-CM

## 2020-10-31 DIAGNOSIS — R269 Unspecified abnormalities of gait and mobility: Secondary | ICD-10-CM | POA: Diagnosis not present

## 2020-10-31 DIAGNOSIS — R2689 Other abnormalities of gait and mobility: Secondary | ICD-10-CM | POA: Diagnosis not present

## 2020-10-31 DIAGNOSIS — R262 Difficulty in walking, not elsewhere classified: Secondary | ICD-10-CM | POA: Diagnosis not present

## 2020-10-31 NOTE — Therapy (Signed)
[place] MAIN [facility] SERVICES [address] [place], [place], [address] Phone: [phone]   Fax:  [phone]  Physical Therapy Treatment  Patient Details  Name: Malik Jones MRN: [id number] Date of Birth: [birth year] No data recorded  Encounter Date: 10/31/2020   PT End of Session - 10/31/20 0820     Visit Number 8    Number of Visits 24    Date for PT Re-Evaluation 12/31/20    Authorization - Visit Number 5    Progress Note Due on Visit 10    PT Start Time 0813    PT Stop Time 0845    PT Time Calculation (min) 32 min    Equipment Utilized During Treatment Gait belt    Activity Tolerance Patient tolerated treatment well    Behavior During Therapy WFL for tasks assessed/performed             Past Medical History:  Diagnosis Date   Medical history non-contributory     Past Surgical History:  Procedure Laterality Date   MANDIBLE FRACTURE SURGERY     NO PAST SURGERIES      There were no vitals filed for this visit.   Subjective Assessment - 10/31/20 0819     Subjective Patient reports doing well and not using his walker as much these days.    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 Infarct occured on 09/11/20; no significant PMH. He attended inpatient rehab services at [facility] for 2 weeks and d/c from inpatient 10 days ago. He lived with his brother for 1 week before moving back into his own home. He is now living independently with abundant family check-ins. He is currently not driving or employed. He states his employer has had to post his job during this time. Pt reports 1 fall immediately after stroke, otherwise no history of falls. Only PT he has recieved has been post-infarct. Pt arrivied with RW however states he rarely uses it and does nto use it in the home.     Limitations House hold activities;Walking;Lifting;Writing    How long can you sit comfortably? unaffected    How long can you stand comfortably? states standing is not a problem    How long can you walk comfortably? states walking is not a problem    Patient Stated Goals return to work and driving, be fully independent as he was before cerebellar infarct    Currently in Pain? No/denies             INTERVENTIONS:  Neuromuscular- re-ed:   SLS x 10 sec on firm surfaces X 3 trials each leg- slight less time on right LE.  Static stand on blue airex  with EO x 20 with feet narrowed Static stand on blue airex with EC x 20 sec with feet narrowed  Static stand on blue airex with EC x 20 sec and head turning.  Dynamic marching on blue airex pad without UE support- Decreased time standing on right LE   Dynamic walking in hallway- No AD-  approx 300 total feet- decreased heel to toe and step length but did improve with only VC.   Biodex TM- gait trainer using UE support and gait belt-  Step length= 0. 77 m RLE/0.42 m L Time on each foot 51%R/49%L Gait velocity= 0.63 m/s Total Time on TM = 19min  Sit  to stand with (one leg on airex pad) x 10 reps BLE.  Standing Lunge- x 12 reps each LE (starting on airex pad and lunging onto 6in box) - Requried 1UE support due to unsteadiness.   Clinical Impression: Treatment limited secondary to late arrival. He was very well motivated and pleasant throughout treatment today- Engaging in [facility] and able to respond favorably today- trying to improve his step length on treadmill. He performed well with LE strengthening but required 1UE support with dynamic lunges. Patient will continue to benefit from skilled physical therapy intervention to address his impairments and improve his overall quality of life.                           PT Education - 10/31/20 0819     Education Details Exercise technique    Person(s) Educated Patient     Methods Explanation;Demonstration;Tactile cues;Verbal cues    Comprehension Returned demonstration;Verbal cues required;Verbalized understanding;Tactile cues required;Need further instruction              PT Short Term Goals - 10/08/20 1713       PT SHORT TERM GOAL #1   Title Patient will be independent in home exercise program to improve strength/mobility for better functional independence with ADLs.    Time 6    Period Weeks    Status New    Target Date 11/19/20               PT Long Term Goals - 10/15/20 1021       PT LONG TERM GOAL #1   Title Patient will increase FOTO score to equal to or greater than 77 to demonstrate statistically significant improvement in mobility and quality of life.    Baseline 10/08/20: 63    Time 12    Period Weeks    Status New      PT LONG TERM GOAL #2   Title Pt will improve BERG by at least 3 points in order to demonstrate clinically significant improvement in balance.    Baseline 42 10/11/20    Time 8    Period Weeks    Status New      PT LONG TERM GOAL #3   Title Patient will reduce timed up and go to <11 seconds to reduce fall risk and demonstrate improved transfer/gait ability.    Baseline 10/08/20: 12.68 seconds;    Time 12    Period Weeks    Status New      PT LONG TERM GOAL #4   Title Patient will increase six minute walk test distance to >1000 for progression to community ambulator and improve gait ability    Baseline 10/08/20: 939ft    Time 12    Period Weeks    Status New      PT LONG TERM GOAL #5   Title Patient will increase Functional Gait Assessment score to >20/30 as to reduce fall risk and improve dynamic gait safety with community ambulation.    Baseline 10/15/20: 14/30    Time 12    Period Weeks    Status New    Target Date 12/31/20                   Plan - 10/31/20 0820     Clinical Impression Statement Treatment limited secondary to late arrival. He was very well motivated and pleasant  throughout treatment today- Engaging in [facility] and able to respond favorably today-  trying to improve his step length on treadmill. He performed well with LE strengthening but required 1UE support with dynamic lunges. Patient will continue to benefit from skilled physical therapy intervention to address his impairments and improve his overall quality of life.    Personal Factors and Comorbidities Age;Comorbidity 1;Comorbidity 2    Comorbidities HTN , DM    Examination-Activity Limitations Locomotion Level;Transfers;Stairs    Examination-Participation Restrictions Driving;Occupation;Community Activity;Yard Work    Stability/Clinical Decision Making Stable/Uncomplicated    Rehab Potential Excellent    PT Frequency 2x / week    PT Duration 12 weeks    PT Treatment/Interventions ADLs/Self Care Home Management;Aquatic Therapy;Biofeedback;Cryotherapy;Electrical Stimulation;Moist Heat;Traction;Ultrasound;Gait training;DME Instruction;Stair training;Functional mobility training;Therapeutic activities;Neuromuscular re-education;Balance training;Therapeutic exercise;Patient/family education;Manual techniques    PT Next Visit Plan begin dynamc gait training activity, continue with LE strengthening program    PT Home Exercise Plan Provided Access Code: [email or web address]  URL: [email or web address]  Date: 10/11/2020  Prepared by: Rivka Barbara    Consulted and Agree with Plan of Care Patient             Patient will benefit from skilled therapeutic intervention in order to improve the following deficits and impairments:  Abnormal gait, Decreased activity tolerance, Decreased endurance, Impaired sensation, Improper body mechanics, Decreased balance, Decreased coordination, Decreased mobility, Decreased safety awareness, Difficulty walking  Visit Diagnosis: Abnormality of gait and mobility  Difficulty in walking, not elsewhere classified  Muscle weakness (generalized)  Other lack of  coordination     Problem List Patient Active Problem List   Diagnosis Date Noted   Vitamin D deficiency 10/04/2020   Mediastinal adenopathy 10/04/2020   Hyponatremia    Transaminitis    Diabetes mellitus, new onset ([place])    Malnutrition of moderate degree 09/13/2020   Stroke ([place]) 09/12/2020   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 [place], [place], [address] Phone: [phone]   Fax:  [phone]  Name: Malik Jones MRN: [id number] Date of Birth: July 30, 1960

## 2020-10-31 NOTE — Therapy (Signed)
[place] MAIN [facility] SERVICES [address] [place], [place], [address] Phone: [phone]   Fax:  [phone]  Occupational Therapy Treatment  Patient Details  Name: Malik Jones MRN: [id number] Date of Birth: [birth year] Referring Provider (OT): Dr. Leeroy Cha   Encounter Date: 10/31/2020   OT End of Session - 10/31/20 0851     Visit Number 8    Number of Visits 24    Date for OT Re-Evaluation 12/30/20    Authorization Time Period Reporting period starting 10/08/2020    OT Start Time 0846    OT Stop Time 0930    OT Time Calculation (min) 44 min    Equipment Utilized During Treatment RW    Activity Tolerance Patient tolerated treatment well    Behavior During Therapy [facility] for tasks assessed/performed             Past Medical History:  Diagnosis Date   Medical history non-contributory     Past Surgical History:  Procedure Laterality Date   MANDIBLE FRACTURE SURGERY     NO PAST SURGERIES      There were no vitals filed for this visit.   Subjective Assessment - 10/31/20 0849     Subjective  "Eating is the only problem I have"    Pertinent History cerebellar stroke    Limitations balance, R sided weakness and ataxia    Patient Stated Goals "I want to get back to normal."    Currently in Pain? No/denies                Therapeutic Exercise Pt completed 7.0 lb digi-flex for finger strengthening, cues for digit isolation. Pt performed hand strengthening with blue theraputty. Demonstrated x2 HEP exercises, cues and instruction to add x3 exercises to HEP. Pt worked on gross grip loop, lateral pinch, 3pt. pinch, gross digit extension, digit extension table spread, digit abduction loop, single digit extension loop, thumb opposition, and lumbical ex. Pt required verbal and visual cues for proper technique.  Neuromuscular Re-education Pt performed [facility] tasks using the Grooved pegboard. Pt worked on grasping the grooved  pegs from a horizontal position and worked on Clinical cytogeneticist, moving the pegs to a vertical position in the hand to prepare for placing them in the grooved slot. Pt placed and removed 20 pegs x2 using a 1# wrist weight for second trial with self-reported improvement. Noted difficulty holding multiple pegs in R palm while completing task, dropped multiple pegs t/o. Pt worked on shuffling, dealing, and flipping cards while alternating directions to incorporate thumb on fingers/fingers on  thumb motion in the development of fine motor coordination skills. Worked on progressively increasing the speed while maintaining control. Pt worked on Media planner and speed. Pt held the pen for the entire exercise, writing x15 words with good letter spacing and line spacing, fair  legibility.         OT Education - 10/31/20 0851     Education Details HEP    Person(s) Educated Patient    Methods Explanation;Demonstration;Verbal cues    Comprehension Verbalized understanding;Verbal cues required;Need further instruction;Returned demonstration              OT Short Term Goals - 10/09/20 0836       OT SHORT TERM GOAL #1   Title Pt will be indep with HEP for RUE strength/coordination    Baseline Eval: HEP initiated at eval, will continue to add on    Time 6    Period Weeks  Status New    Target Date 11/18/20               OT Long Term Goals - 10/09/20 0837       OT LONG TERM GOAL #1   Title Pt will increase FOTO score to 60 or better to indicate increased functional performance.    Baseline Eval: FOTO 52    Time 12    Period Weeks    Status New    Target Date 12/30/20      OT LONG TERM GOAL #2   Title Pt will increase R grip strength to 80 lbs or better to improve carrying heavier ADL supplies with R dominant hand.    Baseline Eval: R grip 65 lbs (L non-dominant 76 lbs)    Time 12    Period Weeks    Status New    Target Date 12/30/20      OT LONG TERM GOAL #3   Title  Pt will improve ability to pick up and manipulate small ADL supplies without dropping with R dominant hand as demonstrated by improved 9 hole peg test score to <30 sec or better.    Baseline Eval: R 9 hole peg test 56.3 sec (L non-dominant 20.6 sec)    Time 12    Period Weeks    Status New    Target Date 12/30/20      OT LONG TERM GOAL #4   Title Pt will improve R GMC as demonstrated by ability to brush teeth, shave, eat with R dominant hand.    Baseline Eval: Pt using L non-dominant hand to brush teeth, shave, eat d/t moderate ataxia throughout RUE.    Time 12    Period Weeks    Status New    Target Date 12/30/20                   Plan - 10/31/20 3419     Clinical Impression Statement Pt is progressing well with picking up small objects and moving them within palm to tips of fingers. Pt return demonstrated theraputty HEP exercises, progressing to blue theraputty (issued today) with additional exercises provided.Pt continues to demonstrate increased tremors over prolonged tasks/exercises and requires cues for rest breaks.  Pt continues to improve RUE [facility] [place] for self care tasks, but RUE ataxia and weakness continue to limit efficiency and accuracy when engaging RUE into self care.    OT Occupational Profile and History Detailed Assessment- Review of Records and additional review of physical, cognitive, psychosocial history related to current functional performance    Occupational performance deficits (Please refer to evaluation for details): ADL's;IADL's;Leisure    Body Structure / Function / Physical Skills ADL;Dexterity;Strength;Balance;Coordination;FMC;IADL;Endurance;Sensation;UE functional use;GMC    Rehab Potential Excellent    Clinical Decision Making Several treatment options, min-mod task modification necessary    Comorbidities Affecting Occupational Performance: May have comorbidities impacting occupational performance    Modification or Assistance to Complete  Evaluation  No modification of tasks or assist necessary to complete eval    OT Frequency 2x / week    OT Duration 12 weeks    OT Treatment/Interventions Self-care/ADL training;Therapeutic exercise;DME and/or AE instruction;Balance training;Neuromuscular education;Energy conservation;Therapeutic activities;Patient/family education;Coping strategies training    OT Home Exercise Plan theraputty increased to blue and instructed in exercises today    Consulted and Agree with Plan of Care Patient             Patient will benefit from skilled therapeutic intervention in  order to improve the following deficits and impairments:   Body Structure / Function / Physical Skills: ADL, Dexterity, Strength, Balance, Coordination, FMC, IADL, Endurance, Sensation, UE functional use, GMC       Visit Diagnosis: Muscle weakness (generalized)  Other lack of coordination    Problem List Patient Active Problem List   Diagnosis Date Noted   Vitamin D deficiency 10/04/2020   Mediastinal adenopathy 10/04/2020   Hyponatremia    Transaminitis    Diabetes mellitus, new onset ([place])    Malnutrition of moderate degree 09/13/2020   Stroke ([place]) 09/12/2020   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 OTR/L  10/31/20, 9:18 AM  ascom [phone]   [place] MAIN [facility] SERVICES [address] [place], [place], [address] Phone: [phone]   Fax:  [phone]  Name: Norlan Rann MRN: [id number] Date of Birth: 12-22-1960

## 2020-11-05 ENCOUNTER — Other Ambulatory Visit: Payer: Self-pay

## 2020-11-05 ENCOUNTER — Ambulatory Visit: Payer: BC Managed Care – PPO | Admitting: Physical Therapy

## 2020-11-05 ENCOUNTER — Ambulatory Visit: Payer: BC Managed Care – PPO

## 2020-11-05 DIAGNOSIS — R2681 Unsteadiness on feet: Secondary | ICD-10-CM

## 2020-11-05 DIAGNOSIS — R278 Other lack of coordination: Secondary | ICD-10-CM

## 2020-11-05 DIAGNOSIS — R269 Unspecified abnormalities of gait and mobility: Secondary | ICD-10-CM

## 2020-11-05 DIAGNOSIS — R2689 Other abnormalities of gait and mobility: Secondary | ICD-10-CM | POA: Diagnosis not present

## 2020-11-05 DIAGNOSIS — M6281 Muscle weakness (generalized): Secondary | ICD-10-CM

## 2020-11-05 DIAGNOSIS — I639 Cerebral infarction, unspecified: Secondary | ICD-10-CM

## 2020-11-05 DIAGNOSIS — R262 Difficulty in walking, not elsewhere classified: Secondary | ICD-10-CM

## 2020-11-05 NOTE — Therapy (Signed)
[place] MAIN [facility] SERVICES [address] [place], [place], [address] Phone: [phone]   Fax:  [phone]  Physical Therapy Treatment  Patient Details  Name: Malik Jones MRN: [id number] Date of Birth: 1960-09-17 No data recorded  Encounter Date: 11/05/2020   PT End of Session - 11/05/20 0819     Visit Number 9    Number of Visits 24    Date for PT Re-Evaluation 12/31/20    Authorization - Visit Number 8    Progress Note Due on Visit 10    PT Start Time 0809    PT Stop Time 0845    PT Time Calculation (min) 36 min    Equipment Utilized During Treatment Gait belt    Activity Tolerance Patient tolerated treatment well    Behavior During Therapy WFL for tasks assessed/performed             Past Medical History:  Diagnosis Date   Medical history non-contributory     Past Surgical History:  Procedure Laterality Date   MANDIBLE FRACTURE SURGERY     NO PAST SURGERIES      There were no vitals filed for this visit.   Subjective Assessment - 11/05/20 0811     Subjective pt reports no significant changes since previous session. Pt reports no falls or LOB since previous session.    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 Infarct occured on 09/11/20; no significant PMH. He attended inpatient rehab services at [facility] for 2 weeks and d/c from inpatient 10 days ago. He lived with his brother for 1 week before moving back into his own home. He is now living independently with abundant family check-ins. He is currently not driving or employed. He states his employer has had to post his job during this time. Pt reports 1 fall immediately after stroke, otherwise no history of falls. Only PT he has recieved has been post-infarct. Pt arrivied with RW however states he rarely uses it  and does nto use it in the home.    Limitations House hold activities;Walking;Lifting;Writing    How long can you sit comfortably? unaffected    How long can you stand comfortably? states standing is not a problem    How long can you walk comfortably? states walking is not a problem    Patient Stated Goals return to work and driving, be fully independent as he was before cerebellar infarct    Currently in Pain? No/denies             Treatment provided this session  Neuromuscular- re-ed:    Marching with 5 sec SLS holds 1 x 1:30 sec with B HH A (fingertips only)  1 x 1:30 sec with no HH A - increased difficulty, someteimes required multiple attempts  Dynamic marching on blue airex pad without UE support- Decreased time standing on right LE  HR on 1/2 Foam Roller  10 x without HH A, unable to copmlete full range of motion, reports high difficulty due to balance component    Dynamic walking in hallway- No AD-  4 min, forward, retro, head turns, speed changes and turns with no LOB noted. Slight ataxic gait still noted.    Biodex TM- gait trainer using UE support and gait belt-  Step length= 0. 55 m RLE/0.44 m L Time on each foot 49%R/51%L Gait  velocity= 0.76 m/s Total Time on TM = 30min   Sit to stand with (one leg on airex pad) x 10 reps BLE.   Standing Lunge- x 12 reps each LE (starting on airex pad and lunging onto 6in box) - -Requried 1UE support due to unsteadiness.  -increased difficulty with L LE as stance LE on airex pad      Unless otherwise stated, CGA was provided and gait belt donned in order to ensure pt safety Pt required occasional rest breaks due fatigue, PT was quick to ask when pt appeared to be fatiguing in order to prevent excessive fatigue. Pt session was cut a little short today due to pt arriving a few minutes late to scheduled appointment time    Pt educated throughout session about proper posture and technique with exercises. Improved exercise  technique, movement at target joints, use of target muscles after min to mod verbal, visual, tactile cues.  Note: Portions of this document were prepared using Dragon voice recognition software and although reviewed may contain unintentional dictation errors in syntax, grammar, or spelling.      .                     PT Education - 11/05/20 0818     Education Details Exercise technique, body mechanics with ambulaiton    Person(s) Educated Patient    Methods Explanation;Tactile cues;Verbal cues;Demonstration    Comprehension Verbalized understanding;Returned demonstration;Verbal cues required;Tactile cues required;Need further instruction              PT Short Term Goals - 10/08/20 1713       PT SHORT TERM GOAL #1   Title Patient will be independent in home exercise program to improve strength/mobility for better functional independence with ADLs.    Time 6    Period Weeks    Status New    Target Date 11/19/20               PT Long Term Goals - 10/15/20 1021       PT LONG TERM GOAL #1   Title Patient will increase FOTO score to equal to or greater than 77 to demonstrate statistically significant improvement in mobility and quality of life.    Baseline 10/08/20: 63    Time 12    Period Weeks    Status New      PT LONG TERM GOAL #2   Title Pt will improve BERG by at least 3 points in order to demonstrate clinically significant improvement in balance.    Baseline 42 10/11/20    Time 8    Period Weeks    Status New      PT LONG TERM GOAL #3   Title Patient will reduce timed up and go to <11 seconds to reduce fall risk and demonstrate improved transfer/gait ability.    Baseline 10/08/20: 12.68 seconds;    Time 12    Period Weeks    Status New      PT LONG TERM GOAL #4   Title Patient will increase six minute walk test distance to >1000 for progression to community ambulator and improve gait ability    Baseline 10/08/20: 924ft    Time 12     Period Weeks    Status New      PT LONG TERM GOAL #5   Title Patient will increase Functional Gait Assessment score to >20/30 as to reduce fall risk and improve dynamic gait safety with community  ambulation.    Baseline 10/15/20: 14/30    Time 12    Period Weeks    Status New    Target Date 12/31/20                   Plan - 11/05/20 0820     Clinical Impression Statement Patient presents to therapy with good motivation and continuing to utilize rolling walker for ambulation to and from physical therapy clinic.  Patient demonstrated good tolerance for neuromuscular reeducation training and other therapeutic interventions today.  Patient continues to demonstrate difficulty with high-level balance activities particularly with decreased base of support as well as with altered surfaces such as Airex pad.  Patient does show improved lower extremity strength.  Patient will continue to benefit from physical therapy intervention in order to improve his lower extremity strength, improve his balance, improve his ambulatory capacity, and improve his overall quality of life.    Personal Factors and Comorbidities Age;Comorbidity 1;Comorbidity 2    Comorbidities HTN , DM    Examination-Activity Limitations Locomotion Level;Transfers;Stairs    Examination-Participation Restrictions Driving;Occupation;Community Activity;Yard Work    Stability/Clinical Decision Making Stable/Uncomplicated    Rehab Potential Excellent    PT Frequency 2x / week    PT Duration 12 weeks    PT Treatment/Interventions ADLs/Self Care Home Management;Aquatic Therapy;Biofeedback;Cryotherapy;Electrical Stimulation;Moist Heat;Traction;Ultrasound;Gait training;DME Instruction;Stair training;Functional mobility training;Therapeutic activities;Neuromuscular re-education;Balance training;Therapeutic exercise;Patient/family education;Manual techniques    PT Next Visit Plan begin dynamc gait training activity, continue with LE  strengthening program    PT Home Exercise Plan Provided Access Code: [email or web address]  URL: [email or web address]  Date: 10/11/2020  Prepared by: Rivka Barbara    Consulted and Agree with Plan of Care Patient             Patient will benefit from skilled therapeutic intervention in order to improve the following deficits and impairments:  Abnormal gait, Decreased activity tolerance, Decreased endurance, Impaired sensation, Improper body mechanics, Decreased balance, Decreased coordination, Decreased mobility, Decreased safety awareness, Difficulty walking  Visit Diagnosis: Muscle weakness (generalized)  Abnormality of gait and mobility  Difficulty in walking, not elsewhere classified  Other lack of coordination  Unsteadiness on feet  Other abnormalities of gait and mobility     Problem List Patient Active Problem List   Diagnosis Date Noted   Vitamin D deficiency 10/04/2020   Mediastinal adenopathy 10/04/2020   Hyponatremia    Transaminitis    Diabetes mellitus, new onset ([place])    Malnutrition of moderate degree 09/13/2020   Stroke ([place]) 09/12/2020   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

## 2020-11-06 NOTE — Therapy (Signed)
[place] MAIN [facility] SERVICES [address] [place], [place], [address] Phone: [phone]   Fax:  [phone]  Occupational Therapy Treatment  Patient Details  Name: Malik Jones MRN: [id number] Date of Birth: [birth year] Referring Provider (OT): Dr. Leeroy Cha   Encounter Date: 11/05/2020   OT End of Session - 11/06/20 0801     Visit Number 9    Number of Visits 24    Date for OT Re-Evaluation 12/30/20    Authorization Time Period Reporting period starting 10/08/2020    OT Start Time 0845    OT Stop Time 0930    OT Time Calculation (min) 45 min    Equipment Utilized During Treatment RW    Activity Tolerance Patient tolerated treatment well    Behavior During Therapy [facility] for tasks assessed/performed             Past Medical History:  Diagnosis Date   Medical history non-contributory     Past Surgical History:  Procedure Laterality Date   MANDIBLE FRACTURE SURGERY     NO PAST SURGERIES      There were no vitals filed for this visit.   Subjective Assessment - 11/05/20 0759     Subjective  "I mowed the lawn the other day.  I did well.  I just took breaks."    Pertinent History cerebellar stroke    Limitations balance, R sided weakness and ataxia    Patient Stated Goals "I want to get back to normal."    Currently in Pain? No/denies    Pain Score 0-No pain    Multiple Pain Sites No            Occupational Therapy Treatment: Neuro re-ed: Participation in FMC/GMC activity for RUE, placing push pins into vertical board at targets with good accuracy.  Mild RUE ataxia when RUE is fully extended, with pt having to increase control by stabilizing hand on board to improve accuracy at target.  Stood at [employer] for drawing, tracing, making marks at targets to address RUE [place] with fairly good accuracy.  RUE fatigues with pt requiring rest breaks between each drawing activity.   Self Care: Participation in handwriting  skills, using standard pen to write months of year, days of the week with 80% legibility and extra time.   Response to Treatment: See Plan/clinical impression below.    OT Education - 11/05/20 0800     Education Details HEP    Person(s) Educated Patient    Methods Explanation;Demonstration;Verbal cues    Comprehension Verbalized understanding;Verbal cues required;Need further instruction;Returned demonstration              OT Short Term Goals - 10/09/20 0836       OT SHORT TERM GOAL #1   Title Pt will be indep with HEP for RUE strength/coordination    Baseline Eval: HEP initiated at eval, will continue to add on    Time 6    Period Weeks    Status New    Target Date 11/18/20               OT Long Term Goals - 10/09/20 0837       OT LONG TERM GOAL #1   Title Pt will increase FOTO score to 60 or better to indicate increased functional performance.    Baseline Eval: FOTO 52    Time 12    Period Weeks    Status New    Target Date 12/30/20  OT LONG TERM GOAL #2   Title Pt will increase R grip strength to 80 lbs or better to improve carrying heavier ADL supplies with R dominant hand.    Baseline Eval: R grip 65 lbs (L non-dominant 76 lbs)    Time 12    Period Weeks    Status New    Target Date 12/30/20      OT LONG TERM GOAL #3   Title Pt will improve ability to pick up and manipulate small ADL supplies without dropping with R dominant hand as demonstrated by improved 9 hole peg test score to <30 sec or better.    Baseline Eval: R 9 hole peg test 56.3 sec (L non-dominant 20.6 sec)    Time 12    Period Weeks    Status New    Target Date 12/30/20      OT LONG TERM GOAL #4   Title Pt will improve R GMC as demonstrated by ability to brush teeth, shave, eat with R dominant hand.    Baseline Eval: Pt using L non-dominant hand to brush teeth, shave, eat d/t moderate ataxia throughout RUE.    Time 12    Period Weeks    Status New    Target Date 12/30/20                    Plan - 11/05/20 0814     Clinical Impression Statement Pt continues to improve with RUE strength and coordination.  Pt reports he continues to make attempts at using RUE for self feeding with a utensil, but hand to mouth patterns must be slow to improve control and accuracy.  Pt will continue to benefit from skilled OT to address RUE weakness and ataxia in order to maximize indep with ADLs/IADLs and pt is hoping for return to work by the end of the year.    OT Occupational Profile and History Detailed Assessment- Review of Records and additional review of physical, cognitive, psychosocial history related to current functional performance    Occupational performance deficits (Please refer to evaluation for details): ADL's;IADL's;Leisure    Body Structure / Function / Physical Skills ADL;Dexterity;Strength;Balance;Coordination;FMC;IADL;Endurance;Sensation;UE functional use;GMC    Rehab Potential Excellent    Clinical Decision Making Several treatment options, min-mod task modification necessary    Comorbidities Affecting Occupational Performance: May have comorbidities impacting occupational performance    Modification or Assistance to Complete Evaluation  No modification of tasks or assist necessary to complete eval    OT Frequency 2x / week    OT Duration 12 weeks    OT Treatment/Interventions Self-care/ADL training;Therapeutic exercise;DME and/or AE instruction;Balance training;Neuromuscular education;Energy conservation;Therapeutic activities;Patient/family education;Coping strategies training    OT Home Exercise Plan theraputty increased to blue and instructed in exercises today    Consulted and Agree with Plan of Care Patient             Patient will benefit from skilled therapeutic intervention in order to improve the following deficits and impairments:   Body Structure / Function / Physical Skills: ADL, Dexterity, Strength, Balance, Coordination, FMC, IADL,  Endurance, Sensation, UE functional use, GMC       Visit Diagnosis: Muscle weakness (generalized)  Other lack of coordination  Cerebellar stroke [facility])    Problem List Patient Active Problem List   Diagnosis Date Noted   Vitamin D deficiency 10/04/2020   Mediastinal adenopathy 10/04/2020   Hyponatremia    Transaminitis    Diabetes mellitus, new onset ([place])  Malnutrition of moderate degree 09/13/2020   Stroke ([place]) 09/12/2020   Aortic thrombus ([facility])    Hyperlipidemia    Essential hypertension    Impaired fasting glucose    Cerebellar stroke ([place]) 09/11/2020   Leta Speller, MS, OTR/L  Darleene Cleaver, OT/L 11/06/2020, 8:14 AM  [place] MAIN [facility] SERVICES [address] [place], [place], [address] Phone: [phone]   Fax:  [phone]  Name: Malik Jones MRN: [id number] Date of Birth: October 22, 1960

## 2020-11-07 ENCOUNTER — Encounter: Payer: Self-pay | Admitting: Adult Health

## 2020-11-07 ENCOUNTER — Ambulatory Visit (INDEPENDENT_AMBULATORY_CARE_PROVIDER_SITE_OTHER): Payer: BC Managed Care – PPO | Admitting: Adult Health

## 2020-11-07 VITALS — BP 139/89 | HR 80 | Ht 65.0 in | Wt 145.0 lb

## 2020-11-07 DIAGNOSIS — I7410 Embolism and thrombosis of unspecified parts of aorta: Secondary | ICD-10-CM

## 2020-11-07 DIAGNOSIS — I639 Cerebral infarction, unspecified: Secondary | ICD-10-CM

## 2020-11-07 DIAGNOSIS — I10 Essential (primary) hypertension: Secondary | ICD-10-CM

## 2020-11-07 DIAGNOSIS — E782 Mixed hyperlipidemia: Secondary | ICD-10-CM | POA: Diagnosis not present

## 2020-11-07 DIAGNOSIS — R7303 Prediabetes: Secondary | ICD-10-CM

## 2020-11-07 NOTE — Progress Notes (Signed)
[facility] [address] [place]. [place] [address] [phone]       HOSPITAL FOLLOW UP NOTE  [name] Date of Birth:  04/18/1960 Medical Record Number:  [id number]   Reason for Referral:  hospital stroke follow up    SUBJECTIVE:   CHIEF COMPLAINT:  Chief Complaint  Patient presents with   Follow-up    RM 2 alone Pt is well, having occasional pain and numbness on L side but overall well. Have some mobility complications some on R side.     HPI:   Malik Jones is a 60 y.o. male with no significant medical history other than marijuana use who presented to [facility] on 09/11/2020 for evaluation of gait disturbance and clumsiness on the right upper and lower extremities that started the night prior.  CT head showed acute/subacute right superior cerebellar infarction as well as remote left superior cerebellar infarct.  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  He was transferred to [facility] on 8/4 for further evaluation and care and possible intervention by cardiothoracic surgery.  Personally reviewed hospitalization pertinent progress notes, lab work and imaging.  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  EF 70 to 75% without cardiac source of embolus identified.  LDL 169.  A1c 6.5.  Remains on IV heparin during hospitalization and recommend transitioning to Eliquis at discharge for 3 to 6 months and then repeat CT angio and if clot resolved switch to antiplatelet therapy.  Continue to be monitored by cardiology.  UDS positive THC.  Initiated atorvastatin 40 mg daily. Eval by Dr. Stanford Breed for input on anticoagulation and recommended nonemergent cardiac surgery evaluation in the future.  Dr. Roxan Hockey consulted recommend repeat CTA chest showing previously identified adherent thrombus no longer visible.  Recommend repeat CT chest to 3 months to monitor for resolution of  mediastinal adenopathy likely infectious in nature.  PT/OT/SLP recommended CIR for residual right hemiparesis with ataxia, left sensory deficits and deficits in higher level cognitive tasks.  Today, 11/07/2020, Malik Jones is being seen for hospital follow-up unaccompanied (brother waiting in lobby).  Reports left-sided sensory impairment with occasional numbness, right sided ataxia and imbalance. Gradually improving currently working with PT/OT.  Currently using at [facility] but does not always use -denies any recent falls.  He lives alone but has supportive family checking on him frequently.  Able to maintain ADLs indendently and has been gradually returning back to some IADLs.  He has not yet returned back to work at [employer] working in the [place] currently on short-term disability.  Denies new stroke/TIA symptoms.  Compliant on Eliquis and atorvastatin without side effects.  Blood pressure today 139/89.  Does not have a BP cuff at home to monitor.  Compliant on amlodipine and hydrochlorothiazide.  He has an appointment to establish care with PCP on 10/19.  No further concerns at this time.     PERTINENT IMAGING  CT HEAD 06/11/2020 IMPRESSION: 1. Acute/subacute nonhemorrhagic right superior cerebellar infarct. 2. Remote left superior cerebellar infarct. 3. Atherosclerosis.  CTA HEAD NECK 09/28/2020 IMPRESSION: Suboptimal contrast bolus timing.   Focal free floating or adherent thrombus along the ascending thoracic aorta.   Major cerebellar artery origins are not well evaluated due to contrast timing. Probable patent right superior cerebellar artery origin with suspected subsequent occlusion.   Perfusion imaging is likely providing incomplete information in the posterior fossa. There is 3 mL of penumbra identified  partially corresponding to the area of right superior cerebellar infarction on the noncontrast head CT.   Foci of nodular consolidation in the visualized lungs suspicious  for pneumonia. Reactive mediastinal adenopathy.  MR BRAIN WO CONTRAST 09/13/2020 IMPRESSION: 1. 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 Mild mass effect on the fourth ventricle which remains patent. No hydrocephalus. No evidence of hemorrhagic transformation. 2. Remote left superior cerebellar artery territory infarct. 3. Mild-to-moderate chronic microvascular ischemic changes of the white matter.  CTA CHEST 09/17/2019 IMPRESSION: 1. The previously identified wall adherent thrombus in the anterior wall of the ascending thoracic aorta is no longer visualized. No other filling defects identified in the thoracic aorta or within the heart. 2. Nodular consolidation cenetered in the bilateral upper lobes with reactive mediastinal lymphadenopathy is most likely infectious pneumonia. Consider follow-up CT chest in 2-3 months to ensure resolution.      ROS:   14 system review of systems performed and negative with exception of those listed in HPI  PMH:  Past Medical History:  Diagnosis Date   Medical history non-contributory     PSH:  Past Surgical History:  Procedure Laterality Date   MANDIBLE FRACTURE SURGERY     NO PAST SURGERIES      Social History:  Social History   Socioeconomic History   Marital status: Single    Spouse name: Not on file   Number of children: Not on file   Years of education: Not on file   Highest education level: Not on file  Occupational History   Not on file  Tobacco Use   Smoking status: Never   Smokeless tobacco: Never  Vaping Use   Vaping Use: Never used  Substance and Sexual Activity   Alcohol use: Yes   Drug use: Yes    Types: Marijuana   Sexual activity: Not Currently  Other Topics Concern   Not on file  Social History Narrative   Not on file   Social Determinants of Health   Financial Resource Strain: Not on file  Food Insecurity: Not on file  Transportation Needs: Not on file  Physical  Activity: Not on file  Stress: Not on file  Social Connections: Not on file  Intimate Partner Violence: Not on file    Family History:  Family History  Problem Relation Age of Onset   Kidney disease Mother    Hip fracture Father     Medications:   Current Outpatient Medications on File Prior to Visit  Medication Sig Dispense Refill   amLODipine (NORVASC) 10 MG tablet Take 1 tablet (10 mg total) by mouth daily. 30 tablet 1   apixaban (ELIQUIS) 5 MG TABS tablet Take 1 tablet (5 mg total) by mouth 2 (two) times daily. 60 tablet 0   atorvastatin (LIPITOR) 40 MG tablet Take 1 tablet (40 mg total) by mouth daily. 30 tablet 1   hydrochlorothiazide (MICROZIDE) 12.5 MG capsule Take 3 capsules (37.5 mg total) by mouth daily. 90 capsule 1   No current facility-administered medications on file prior to visit.    Allergies:  No Known Allergies    OBJECTIVE:  Physical Exam  Vitals:   11/07/20 0802  BP: 139/89  Pulse: 80  Weight: 145 lb (65.8 kg)  Height: 5\' 5"  (1.651 m)   Body mass index is 24.13 kg/m. No results found.  Post stroke PHQ 2/9 Depression screen PHQ 2/9 11/07/2020  Decreased Interest 0  Down, Depressed, Hopeless 0  PHQ - 2 Score 0  General: well developed, well nourished, very pleasant middle-aged African-American male, seated, in no evident distress Head: head normocephalic and atraumatic.   Neck: supple with no carotid or supraclavicular bruits Cardiovascular: regular rate and rhythm, no murmurs Musculoskeletal: no deformity Skin:  no rash/petichiae Vascular:  Normal pulses all extremities   Neurologic Exam Mental Status: Awake and fully alert.  Mild dysarthria (per patient, baseline speech).  No evidence of aphasia.  Oriented to place and time. Recent and remote memory intact. Attention span, concentration and fund of knowledge appropriate during visit. Mood and affect appropriate.  Cranial Nerves: Fundoscopic exam reveals sharp disc margins. Pupils  equal, briskly reactive to light. Extraocular movements full without nystagmus. Visual fields full to confrontation. Hearing intact. Facial sensation intact. Face, tongue, palate moves normally and symmetrically.  Motor: Normal bulk and tone. Normal strength in all tested extremity muscles except slight decrease RUE grip strength and decreased hand dexterity Sensory.: intact to touch , pinprick , position and vibratory sensation although subjective "odd" sensation on LUE and LLE.  Coordination: Rapid alternating movements normal in all extremities except decreased right hand. Finger-to-nose and heel-to-shin right-sided ataxia, normal on left. Gait and Station: Arises from chair without difficulty. Stance is normal. Gait demonstrates normal stride length and intermittent decreased RLE step height and mild imbalance with turns with use of rolling walker. Tandem walk and heel toe not attempted.  Reflexes: 1+ and symmetric. Toes downgoing.     NIHSS  3 Modified Rankin  2-3      ASSESSMENT: Draden Cottingham is a 60 y.o. 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 Vascular risk factors include HLD, pre-DM, THC use and aortic thrombus.      PLAN:  Right cerebellar stroke:  Residual deficit: Left sensory impairment, right-sided ataxia and gait impairment.  Continue working with therapies for hopeful ongoing recovery.  Currently on short-term disability managed by PMR Continue Eliquis (apixaban) daily  and atorvastatin 40 mg daily for secondary stroke prevention.   Discussed secondary stroke prevention measures and importance of close PCP follow up for aggressive stroke risk factor management. I have gone over the pathophysiology of stroke, warning signs and symptoms, risk factors and their management in some detail with instructions  to go to the closest emergency room for symptoms of concern. Aortic thrombus: Plans to establish care with PCP next month.  Discussed follow-up with PCP for repeat CTA chest as recommended at hospital discharge as well as ongoing prescribing of Eliquis with recommended 3 to 48-month course HTN: BP goal <130/90. Stable toady.  Continue current regimen HLD: LDL goal <70. Recent LDL 169.  Continue atorvastatin 40 mg daily Pre-DM: A1c goal<7.  A1c 6.5.      Follow up in 3 months or call earlier if needed   CC:  [facility] provider: Dr. Leonie Man PCP: Ellene Route    I spent 54 minutes of face-to-face and non-face-to-face time with patient.  This included previsit chart review including review of recent hospitalization, lab review, study review, electronic health record documentation, patient education regarding recent stroke including etiology, secondary stroke prevention measures and importance of managing stroke risk factors, residual deficits and typical recovery time and answered all other questions to patient satisfaction  Frann Rider, AGNP-BC  [facility] [address] [place],  [address]  Phone [phone] Fax [phone] Note: This document was prepared with digital dictation and possible smart  Company secretary. Any transcriptional errors that result from this process are unintentional.

## 2020-11-07 NOTE — Patient Instructions (Signed)
Establish care with PCP on 10/19 - defer care for repeat CT chest 2-3 months after discharge and ongoing use of eliquis 2-3 month course (as advised at hospital discharge)  Continue working with therapies for likely ongoing recovery  Continue Eliquis (apixaban) daily  and atorvastatin  for secondary stroke prevention  Continue to follow up with PCP regarding cholesterol and blood pressure management  Maintain strict control of hypertension with blood pressure goal below 130/90 and cholesterol with LDL cholesterol (bad cholesterol) goal below 70 mg/dL.       Followup in the future with me in 3 months or call earlier if needed       Thank you for coming to see [place] at [facility]. I hope we have been able to provide you high quality care today.  You may receive a patient satisfaction survey over the next few weeks. We would appreciate your feedback and comments so that we may continue to improve ourselves and the health of our patients.

## 2020-11-08 ENCOUNTER — Ambulatory Visit: Payer: BC Managed Care – PPO

## 2020-11-08 ENCOUNTER — Ambulatory Visit: Payer: BC Managed Care – PPO | Admitting: Physical Therapy

## 2020-11-08 ENCOUNTER — Other Ambulatory Visit: Payer: Self-pay

## 2020-11-08 DIAGNOSIS — R2689 Other abnormalities of gait and mobility: Secondary | ICD-10-CM | POA: Diagnosis not present

## 2020-11-08 DIAGNOSIS — R2681 Unsteadiness on feet: Secondary | ICD-10-CM | POA: Diagnosis not present

## 2020-11-08 DIAGNOSIS — R278 Other lack of coordination: Secondary | ICD-10-CM | POA: Diagnosis not present

## 2020-11-08 DIAGNOSIS — M6281 Muscle weakness (generalized): Secondary | ICD-10-CM

## 2020-11-08 DIAGNOSIS — R269 Unspecified abnormalities of gait and mobility: Secondary | ICD-10-CM | POA: Diagnosis not present

## 2020-11-08 DIAGNOSIS — R262 Difficulty in walking, not elsewhere classified: Secondary | ICD-10-CM

## 2020-11-08 DIAGNOSIS — I639 Cerebral infarction, unspecified: Secondary | ICD-10-CM

## 2020-11-08 NOTE — Therapy (Signed)
[place] MAIN [facility] SERVICES [address] [place], [place], [address] Phone: [phone]   Fax:  [phone]  Physical Therapy Treatment/ Physical Therapy Progress Note   Dates of reporting period  10/11/20   to   11/08/20   Patient Details  Name: Malik Jones MRN: [id number] Date of Birth: [birth year] No data recorded  Encounter Date: 11/08/2020   PT End of Session - 11/08/20 0851     Visit Number 10    Number of Visits 24    Date for PT Re-Evaluation 12/31/20    Authorization - Visit Number 9    Progress Note Due on Visit 20    Equipment Utilized During Treatment Gait belt    Activity Tolerance Patient tolerated treatment well    Behavior During Therapy [facility] for tasks assessed/performed             Past Medical History:  Diagnosis Date   Medical history non-contributory     Past Surgical History:  Procedure Laterality Date   MANDIBLE FRACTURE SURGERY     NO PAST SURGERIES      There were no vitals filed for this visit.   Subjective Assessment - 11/08/20 0850     Subjective pt reports no significant changes since previous session. Pt reports no falls or LOB since previous session. 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 Infarct occured on 09/11/20; no significant PMH. He attended inpatient rehab services at [facility] for 2 weeks and d/c from inpatient 10 days ago. He lived with his brother for 1 week before moving back into his own home. He is now living independently with abundant family check-ins. He is currently not driving or employed. He states his employer has had to post his job during this time. Pt reports 1 fall  immediately after stroke, otherwise no history of falls. Only PT he has recieved has been post-infarct. Pt arrivied with RW however states he rarely uses it and does nto use it in the home.    Limitations House hold activities;Walking;Lifting;Writing    How long can you sit comfortably? unaffected    How long can you stand comfortably? states standing is not a problem    How long can you walk comfortably? states walking is not a problem    Patient Stated Goals return to work and driving, be fully independent as he was before cerebellar infarct                [facility] PT Assessment - 11/08/20 0001       Observation/Other Assessments   Focus on Therapeutic Outcomes (FOTO)  67      6 minute walk test results    Aerobic Endurance Distance Walked 1210      Standardized Balance Assessment   Standardized Balance Assessment Timed Up and Go Test      Berg Balance Test   Sit to Stand Able to stand without using hands and stabilize independently    Standing Unsupported Able to stand safely 2 minutes    Sitting with Back Unsupported but Feet Supported on Floor or Stool Able to sit safely and securely 2 minutes    Stand to Sit Sits safely with minimal  use of hands    Transfers Able to transfer safely, minor use of hands    Standing Unsupported with Eyes Closed Able to stand 10 seconds safely    Standing Unsupported with Feet Together Able to place feet together independently and stand 1 minute safely    From Standing, Reach Forward with Outstretched Arm Can reach confidently >25 cm (10")    From Standing Position, Pick up Object from Floor Able to pick up shoe safely and easily    From Standing Position, Turn to Look Behind Over each Shoulder Looks behind from both sides and weight shifts well    Turn 360 Degrees Able to turn 360 degrees safely but slowly    Standing Unsupported, Alternately Place Feet on Step/Stool Able to stand independently and complete 8 steps >20 seconds    Standing  Unsupported, One Foot in Front Able to plae foot ahead of the other independently and hold 30 seconds    Standing on One Leg Able to lift leg independently and hold equal to or more than 3 seconds    Total Score 50      Timed Up and Go Test   Normal TUG (seconds) 10.3      Functional Gait  Assessment   Gait assessed  Yes    Gait Level Surface Walks 20 ft in less than 7 sec but greater than 5.5 sec, uses assistive device, slower speed, mild gait deviations, or deviates 6-10 in outside of the 12 in walkway width.    Change in Gait Speed Able to smoothly change walking speed without loss of balance or gait deviation. Deviate no more than 6 in outside of the 12 in walkway width.    Gait with Horizontal Head Turns Performs head turns smoothly with no change in gait. Deviates no more than 6 in outside 12 in walkway width    Gait with Vertical Head Turns Performs head turns with no change in gait. Deviates no more than 6 in outside 12 in walkway width.    Gait and Pivot Turn Turns slowly, requires verbal cueing, or requires several small steps to catch balance following turn and stop    Step Over Obstacle Is able to step over one shoe box (4.5 in total height) but must slow down and adjust steps to clear box safely. May require verbal cueing.    Gait with Narrow Base of Support Ambulates less than 4 steps heel to toe or cannot perform without assistance.    Gait with Eyes Closed Walks 20 ft, uses assistive device, slower speed, mild gait deviations, deviates 6-10 in outside 12 in walkway width. Ambulates 20 ft in less than 9 sec but greater than 7 sec.    Ambulating Backwards Walks 20 ft, uses assistive device, slower speed, mild gait deviations, deviates 6-10 in outside 12 in walkway width.    Steps Alternating feet, no rail.    Total Score 20             Treatment provided this session  Treatment today consisted of assessing patient's function utilizing standardized balance assessments,  walking assessments, and fall risk assessments as described above and below in exam.   Neuro Re- Ed:  Berg balance test.  Patient demonstrated significantly improved ability to perform sit to stand and transfers, as well as improved ability to perform step taps.  Patient still has difficulty with single-leg stance and tandem balance indicating some lack of utilization of ankle strategies for balance responses.  Functional gait  assessment as described above.  Patient frustrated improved ability to ambulate at increased speeds as well as improved ability to ambulate with focus and other things such as with head turns vertically and horizontally.  Patient continues to have difficulty with narrow base of support ambulation as well as stepping over a object without slowing speed or altering gait pattern.  Patient able to navigate stairs without use of rail but has slightly antalgic gait with doing so reports is having to sit at home when he feels confident with his strategies.  Patient does report he may have some moderate difficulty with increased number of stairs such as a full flight of stairs.        There Act:   6-minute walk test.  Patient demonstrated similar gait pattern throughout test had slightly decreased step length on the left side due to decreased single-leg support on the right but this is not significant.  Patient maintained speaking throughout test indicating good endurance with ambulation.  Timed up and go test.  Patient demonstrated significantly improved time and safety with this test.  Times can be found above and flow sheet.      Unless otherwise stated, SBA was provided and gait belt donned in order to ensure pt safety   Pt educated throughout session about proper posture and technique with exercises. Improved exercise technique, movement at target joints, use of target muscles after min to mod verbal, visual, tactile cues.    Note: Portions of this document were  prepared using Dragon voice recognition software and although reviewed may contain unintentional dictation errors in syntax, grammar, or spelling.                     PT Education - 11/08/20 0851     Education Details Educated regarding progress of physical therapy thus far and how progress indicates decreased risk of falls within the home and improved ability to ambulate within the community    Person(s) Educated Patient    Methods Explanation    Comprehension Verbalized understanding              PT Short Term Goals - 11/08/20 0808       PT SHORT TERM GOAL #1   Title Patient will be independent in home exercise program to improve strength/mobility for better functional independence with ADLs.    Time 6    Period Weeks    Status Achieved    Target Date 11/19/20               PT Long Term Goals - 11/08/20 0809       PT LONG TERM GOAL #1   Title Patient will increase FOTO score to equal to or greater than 77 to demonstrate statistically significant improvement in mobility and quality of life.    Baseline 10/08/20: 63, 9/30: 67    Time 12    Period Weeks    Status On-going    Target Date 12/31/20      PT LONG TERM GOAL #2   Title Pt will improve BERG by at least 3 points in order to demonstrate clinically significant improvement in balance.    Baseline 42 10/11/20    Time 8    Period Weeks    Status Achieved      PT LONG TERM GOAL #3   Title Patient will reduce timed up and go to <11 seconds to reduce fall risk and demonstrate improved transfer/gait ability.    Baseline 10/08/20:  12.68 seconds;10.3 sec    Time 12    Period Weeks    Status Achieved      PT LONG TERM GOAL #4   Title Patient will increase six minute walk test distance to >1000 for progression to community ambulator and improve gait ability    Baseline 10/08/20: 943ft    Time 12    Period Weeks    Status Achieved      PT LONG TERM GOAL #5   Title Patient will increase Functional  Gait Assessment score to >23/30 as to reduce fall risk and improve dynamic gait safety with community ambulation.    Baseline 10/15/20: 14/30. 20/30 9/30, revised goal this session    Time 12    Period Weeks    Status Revised      Additional Long Term Goals   Additional Long Term Goals Yes      PT LONG TERM GOAL #6   Title Pt will improve score on mini BEST test by 4 points or greater in order to indicate improved balance and decreased risk of falls.    Baseline to be assessed visit 11    Time 7    Period Weeks    Status New    Target Date 12/27/20                   Plan - 11/08/20 9702     Clinical Impression Statement Patient continues to present to therapy with good motivation and he continues to utilize a rolling walker for ambulation outside of his home.  Patient presents today for a progress note following 9 physical therapy sessions.  Patient to demonstrate significant progress with ambulation speed and endurance as evidenced by ambulating 1210 feet during 6-minute walk test without the utilization of an assistive device.  Patient demonstrates slightly abnormal gait pattern with decreased step length on the left due to decreased single-leg stance time on the right but patient had no loss of balance at any point and was able to keep the same pace throughout the test.  Patient also demonstrates improvement in static balance as demonstrated in Berg balance test score improvement.  Patient scored improvement changes his stratified fall risk category indicating improved safety within the home in the community.  Patient also demonstrates improved gait as demonstrated by functional gait assessment test which can be found later in this exam.  Patient's score on this test indicates decreased risk of falls with higher-level gait tasks.  Patient continue to benefit from skilled physical therapy intervention in order to continue to improve his lower extremity strength, improve his balance, and  decrease his risk of falls, and improve his overall quality of life and function. Patient's condition has the potential to improve in response to therapy. Maximum improvement is yet to be obtained. The anticipated improvement is attainable and reasonable in a generally predictable time.      Personal Factors and Comorbidities Age;Comorbidity 1;Comorbidity 2    Comorbidities HTN , DM    Examination-Activity Limitations Locomotion Level;Transfers;Stairs    Examination-Participation Restrictions Driving;Occupation;Community Activity;Yard Work    Stability/Clinical Decision Making Stable/Uncomplicated    Rehab Potential Excellent    PT Frequency 2x / week    PT Duration 12 weeks    PT Treatment/Interventions ADLs/Self Care Home Management;Aquatic Therapy;Biofeedback;Cryotherapy;Electrical Stimulation;Moist Heat;Traction;Ultrasound;Gait training;DME Instruction;Stair training;Functional mobility training;Therapeutic activities;Neuromuscular re-education;Balance training;Therapeutic exercise;Patient/family education;Manual techniques    PT Next Visit Plan begin dynamc gait training activity, continue with LE strengthening program    PT  Home Exercise Plan Provided Access Code: [email or web address]  URL: [email or web address]  Date: 10/11/2020  Prepared by: Rivka Barbara    Consulted and Agree with Plan of Care Patient             Patient will benefit from skilled therapeutic intervention in order to improve the following deficits and impairments:  Abnormal gait, Decreased activity tolerance, Decreased endurance, Impaired sensation, Improper body mechanics, Decreased balance, Decreased coordination, Decreased mobility, Decreased safety awareness, Difficulty walking  Visit Diagnosis: Muscle weakness (generalized)  Unsteadiness on feet  Abnormality of gait and mobility  Difficulty in walking, not elsewhere classified  Other abnormalities of gait and mobility     Problem List Patient  Active Problem List   Diagnosis Date Noted   Vitamin D deficiency 10/04/2020   Mediastinal adenopathy 10/04/2020   Hyponatremia    Transaminitis    Diabetes mellitus, new onset ([place])    Malnutrition of moderate degree 09/13/2020   Stroke ([place]) 09/12/2020   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

## 2020-11-08 NOTE — Therapy (Signed)
[place] MAIN [facility] SERVICES [address] [place], [place], [address] Phone: [phone]   Fax:  [phone]  Occupational Therapy Treatment/Progress Note Reporting period 10/08/20-11/08/20  Patient Details  Name: Malik Jones MRN: [id number] Date of Birth: 1960-05-13 Referring Provider (OT): Dr. Leeroy Cha   Encounter Date: 11/08/2020   OT End of Session - 11/08/20 1617     Visit Number 10    Number of Visits 24    Date for OT Re-Evaluation 12/30/20    Authorization Time Period Reporting period starting 10/08/2020    OT Start Time 0847    OT Stop Time 0932    OT Time Calculation (min) 45 min    Equipment Utilized During Treatment RW    Activity Tolerance Patient tolerated treatment well    Behavior During Therapy [facility] for tasks assessed/performed             Past Medical History:  Diagnosis Date   Medical history non-contributory     Past Surgical History:  Procedure Laterality Date   MANDIBLE FRACTURE SURGERY     NO PAST SURGERIES      There were no vitals filed for this visit.   Subjective Assessment - 11/08/20 1616     Subjective  "My hand still shakes when it comes close to my mouth." (when trying to eat with R hand)    Pertinent History cerebellar stroke    Limitations balance, R sided weakness and ataxia    Patient Stated Goals "I want to get back to normal."    Currently in Pain? No/denies    Pain Score 0-No pain    Multiple Pain Sites No             [facility] OT Assessment - 11/08/20 1632       Assessment   Hand Dominance Right      Observation/Other Assessments   Focus on Therapeutic Outcomes (FOTO)  67      Coordination   Right 9 Hole Peg Test 48 secs    Left 9 Hole Peg Test 20.6 sec      Strength   Overall Strength Comments BUEs 5/5      Hand Function   Right Hand Grip (lbs) 79 lbs    Right Hand Lateral Pinch 19 lbs    Right Hand 3 Point Pinch 17 lbs               Occupational  Therapy Treatment/Progress Note: Neuro re-ed: Pt constructed [employer] pegs to address R hand coordination/small item pick up.  Pt presents with mild-moderate ataxia with outstretched R arm when R hand is not resting on table top.    Self Care: Trialed use of 1 and 2 lb wrist weights, no wrist weights and weighted spoon, and standard spoon.  OT and pt observed no benefits from wrist weights or weighted utensil to minimize RUE ataxia when self feeding apple sauce.  Trialed WB to R elbow and forearm on table top, also with no significant improvement in ataxia.  OT continued to reinforce importance of continued attempts at home to self feed with R hand to retrain brain with hand to mouth patterns on R side.  Pt states it's very frustrating but he works on this when he's alone and has the extra time to make errors.    Response to Treatment: Pt is making steady gains towards all OT goals.  R grip has improved from 65 to 79 lbs, 9 hole peg test has  improved from 56.3 seconds to 48 seconds.  Pt is now able to eat finger foods with RUE, but mild-moderate ataxia in RUE prevents ability to successfully use eating utensils with R hand without spilling, so pt compensates with L hand when using utensils.  Pt self fed applesauce with standard spoon today, intermittent spilling and mild-moderate ataxia noted when spoon is nearing mouth.  Trialed wrist weights, weight bearing to elbow and forearm, and weighted utensil, all of which showed no significant benefit.  OT has encouraged pt to continue with self feeding trials with standard utensils at home when he is able to take his time.  Reinforced that repetition will help to retrain RUE for more precise hand to mouth patterns.  OT will continue to address RUE ataxia in order to improve RUE coordination for ADL and IADL tasks.     OT Education - 11/08/20 1617     Education Details Self feeding strategies using R hand    Person(s) Educated Patient    Methods  Explanation;Demonstration;Verbal cues    Comprehension Verbalized understanding;Verbal cues required;Need further instruction;Returned demonstration              OT Short Term Goals - 11/08/20 1618       OT SHORT TERM GOAL #1   Title Pt will be indep with HEP for RUE strength/coordination    Baseline Eval: HEP initiated at eval, will continue to add on; indep with HEP    Time 6    Period Weeks    Status Achieved    Target Date 11/18/20               OT Long Term Goals - 11/08/20 1618       OT LONG TERM GOAL #1   Title Pt will increase FOTO score to 60 or better to indicate increased functional performance.    Baseline Eval: FOTO 52; 11/08/20: FOTO score 67    Time 12    Period Weeks    Status On-going    Target Date 12/30/20      OT LONG TERM GOAL #2   Title Pt will increase R grip strength to 80 lbs or better to improve carrying heavier ADL supplies with R dominant hand.    Baseline Eval: R grip 65 lbs (L non-dominant 76 lbs); 11/08/20: R grip 79 lbs    Time 12    Period Weeks    Status Partially Met    Target Date 12/30/20      OT LONG TERM GOAL #3   Title Pt will improve ability to pick up and manipulate small ADL supplies without dropping with R dominant hand as demonstrated by improved 9 hole peg test score to <30 sec or better.    Baseline Eval: R 9 hole peg test 56.3 sec (L non-dominant 20.6 sec); 11/08/2020: 9 hole peg test 48 sec    Time 12    Period Weeks    Status Partially Met    Target Date 12/30/20      OT LONG TERM GOAL #4   Title Pt will improve R GMC as demonstrated by ability to brush teeth, shave, eat with R dominant hand.    Baseline Eval: Pt using L non-dominant hand to brush teeth, shave, eat d/t moderate ataxia throughout RUE; 11/08/20: Pt still brushing teeth with L hand, son shaves him, but pt can eat finger foods with R hand.  Pt can self feed apple sauce with spoon in R hand with moderate ataxia,  some spilling.    Time 12    Period  Weeks    Status On-going    Target Date 12/30/20              Plan - 11/08/20 1641     Clinical Impression Statement Pt is making steady gains towards all OT goals.  R grip has improved from 65 to 79 lbs, 9 hole peg test has improved from 56.3 seconds to 48 seconds.  Pt is now able to eat finger foods with RUE, but mild-moderate ataxia in RUE prevents ability to successfully use eating utensils with R hand without spilling, so pt compensates with L hand when using utensils.  Pt self fed applesauce with standard spoon today, intermittent spilling and mild-moderate ataxia noted when spoon is nearing mouth.  Trialed wrist weights, weight bearing to elbow and forearm, and weighted utensil, all of which showed no significant benefit.  OT has encouraged pt to continue with self feeding trials with standard utensils at home when he is able to take his time.  Reinforced that repetition will help to retrain RUE for more precise hand to mouth patterns.  OT will continue to address RUE ataxia in order to improve RUE coordination for ADL and IADL tasks.    OT Occupational Profile and History Detailed Assessment- Review of Records and additional review of physical, cognitive, psychosocial history related to current functional performance    Occupational performance deficits (Please refer to evaluation for details): ADL's;IADL's;Leisure    Body Structure / Function / Physical Skills ADL;Dexterity;Strength;Balance;Coordination;FMC;IADL;Endurance;Sensation;UE functional use;GMC    Rehab Potential Excellent    Clinical Decision Making Several treatment options, min-mod task modification necessary    Comorbidities Affecting Occupational Performance: May have comorbidities impacting occupational performance    Modification or Assistance to Complete Evaluation  No modification of tasks or assist necessary to complete eval    OT Frequency 2x / week    OT Duration 12 weeks    OT Treatment/Interventions Self-care/ADL  training;Therapeutic exercise;DME and/or AE instruction;Balance training;Neuromuscular education;Energy conservation;Therapeutic activities;Patient/family education;Coping strategies training    OT Home Exercise Plan theraputty increased to blue and instructed in exercises today    Consulted and Agree with Plan of Care Patient             Patient will benefit from skilled therapeutic intervention in order to improve the following deficits and impairments:   Body Structure / Function / Physical Skills: ADL, Dexterity, Strength, Balance, Coordination, FMC, IADL, Endurance, Sensation, UE functional use, GMC       Visit Diagnosis: Muscle weakness (generalized)  Other lack of coordination  Cerebellar stroke [facility])    Problem List Patient Active Problem List   Diagnosis Date Noted   Vitamin D deficiency 10/04/2020   Mediastinal adenopathy 10/04/2020   Hyponatremia    Transaminitis    Diabetes mellitus, new onset ([place])    Malnutrition of moderate degree 09/13/2020   Stroke ([place]) 09/12/2020   Aortic thrombus ([facility])    Hyperlipidemia    Essential hypertension    Impaired fasting glucose    Cerebellar stroke ([place]) 09/11/2020   Leta Speller, MS, OTR/L  Darleene Cleaver, OT/L 11/08/2020, 4:44 PM  [place] [address] [place], [place], [address] Phone: [phone]   Fax:  [phone]  Name: Malik Jones MRN: [id number] Date of Birth: [birth year]

## 2020-11-10 NOTE — Progress Notes (Signed)
I agree with the above plan 

## 2020-11-11 ENCOUNTER — Ambulatory Visit: Payer: BC Managed Care – PPO | Attending: Physical Medicine and Rehabilitation

## 2020-11-11 ENCOUNTER — Ambulatory Visit: Payer: BC Managed Care – PPO

## 2020-11-11 ENCOUNTER — Other Ambulatory Visit: Payer: Self-pay

## 2020-11-11 DIAGNOSIS — M6281 Muscle weakness (generalized): Secondary | ICD-10-CM

## 2020-11-11 DIAGNOSIS — R269 Unspecified abnormalities of gait and mobility: Secondary | ICD-10-CM | POA: Insufficient documentation

## 2020-11-11 DIAGNOSIS — R2681 Unsteadiness on feet: Secondary | ICD-10-CM | POA: Insufficient documentation

## 2020-11-11 DIAGNOSIS — R278 Other lack of coordination: Secondary | ICD-10-CM | POA: Insufficient documentation

## 2020-11-11 DIAGNOSIS — R262 Difficulty in walking, not elsewhere classified: Secondary | ICD-10-CM | POA: Diagnosis not present

## 2020-11-11 DIAGNOSIS — R2689 Other abnormalities of gait and mobility: Secondary | ICD-10-CM | POA: Insufficient documentation

## 2020-11-11 DIAGNOSIS — I639 Cerebral infarction, unspecified: Secondary | ICD-10-CM | POA: Insufficient documentation

## 2020-11-11 NOTE — Therapy (Signed)
[place] MAIN [facility] SERVICES [address] [place], [place], [address] Phone: [phone]   Fax:  [phone]  Occupational Therapy Treatment  Patient Details  Name: Malik Jones MRN: [id number] Date of Birth: 1961/01/11 Referring Provider (OT): Dr. Leeroy Cha   Encounter Date: 11/11/2020   OT End of Session - 11/11/20 1035     Visit Number 11    Number of Visits 24    Date for OT Re-Evaluation 12/30/20    Authorization Time Period Reporting period starting 10/08/2020    OT Start Time 0935    OT Stop Time 1020    OT Time Calculation (min) 45 min    Activity Tolerance Patient tolerated treatment well    Behavior During Therapy [facility] for tasks assessed/performed             Past Medical History:  Diagnosis Date   Medical history non-contributory     Past Surgical History:  Procedure Laterality Date   MANDIBLE FRACTURE SURGERY     NO PAST SURGERIES      There were no vitals filed for this visit.   Subjective Assessment - 11/11/20 1033     Subjective  "It felt good. I felt steady." (pt referring to coming in today without walker)    Pertinent History cerebellar stroke    Limitations balance, R sided weakness and ataxia    Patient Stated Goals "I want to get back to normal."    Currently in Pain? No/denies    Pain Score 0-No pain    Multiple Pain Sites No            Occupational Therapy Treatment: Neuro re-ed: Used therapy resistant clothespins to address pinch strength and GMC/reaching toward a target.  Placed vertical dowel near pt and had pt clip pins at chin/mouth height to simulate hand to mouth patterns for self feeding with R hand.  Also used washers to place over vertical dowel with same pattern as noted with clips.  Mild ataxia in RUE when pt nears chin/mouth height.   Self Care: Practiced with standard spoon to scoop small stones back and forth from table to near mouth, and back and forth to buckets  placed in front of pt on table top to further target reaching and control of RUE with objects on spoon without spilling.  Practiced scooping water from 1 cup to another with fairly good control using R hand, then scooping water and bringing toward mouth with noted increased spilling.    Response to Treatment: See Plan/clinical impression below.    OT Education - 11/11/20 1034     Education Details self feeding compensatory strategies to engage R hand    Person(s) Educated Patient    Methods Explanation;Demonstration;Verbal cues    Comprehension Verbalized understanding;Verbal cues required;Need further instruction;Returned demonstration              OT Short Term Goals - 11/08/20 1618       OT SHORT TERM GOAL #1   Title Pt will be indep with HEP for RUE strength/coordination    Baseline Eval: HEP initiated at eval, will continue to add on; indep with HEP    Time 6    Period Weeks    Status Achieved    Target Date 11/18/20               OT Long Term Goals - 11/08/20 1618       OT LONG TERM GOAL #1   Title Pt  will increase FOTO score to 60 or better to indicate increased functional performance.    Baseline Eval: FOTO 52; 11/08/20: FOTO score 67    Time 12    Period Weeks    Status On-going    Target Date 12/30/20      OT LONG TERM GOAL #2   Title Pt will increase R grip strength to 80 lbs or better to improve carrying heavier ADL supplies with R dominant hand.    Baseline Eval: R grip 65 lbs (L non-dominant 76 lbs); 11/08/20: R grip 79 lbs    Time 12    Period Weeks    Status Partially Met    Target Date 12/30/20      OT LONG TERM GOAL #3   Title Pt will improve ability to pick up and manipulate small ADL supplies without dropping with R dominant hand as demonstrated by improved 9 hole peg test score to <30 sec or better.    Baseline Eval: R 9 hole peg test 56.3 sec (L non-dominant 20.6 sec); 11/08/2020: 9 hole peg test 48 sec    Time 12    Period Weeks     Status Partially Met    Target Date 12/30/20      OT LONG TERM GOAL #4   Title Pt will improve R GMC as demonstrated by ability to brush teeth, shave, eat with R dominant hand.    Baseline Eval: Pt using L non-dominant hand to brush teeth, shave, eat d/t moderate ataxia throughout RUE; 11/08/20: Pt still brushing teeth with L hand, son shaves him, but pt can eat finger foods with R hand.  Pt can self feed apple sauce with spoon in R hand with moderate ataxia, some spilling.    Time 12    Period Weeks    Status On-going    Target Date 12/30/20                   Plan - 11/11/20 1051     Clinical Impression Statement Pt continues to avoid self feeding in public with R dominant hand d/t RUE ataxia and spilling.  Pt reports that he makes attempts at home with R hand, but becomes easily frustrated and often use L hand to grab R wrist to increase control with hand to mouth patterns.  OT encouraged pt switch cups at home to utilize a cup/thermos with a lid to enable consistent use of R hand and continued to encourage pt engage R hand as often as possible at home for self feeding attempts, allowing extra time when able to reduce frustration.  Pt verbalized understanding and receptive to using cup with a lid to increase use of R hand when drinking.  Pt will continue to benefit from skilled OT to address RUE ataxia which limits self care performance.    OT Occupational Profile and History Detailed Assessment- Review of Records and additional review of physical, cognitive, psychosocial history related to current functional performance    Occupational performance deficits (Please refer to evaluation for details): ADL's;IADL's;Leisure    Body Structure / Function / Physical Skills ADL;Dexterity;Strength;Balance;Coordination;FMC;IADL;Endurance;Sensation;UE functional use;GMC    Rehab Potential Excellent    Clinical Decision Making Several treatment options, min-mod task modification necessary     Comorbidities Affecting Occupational Performance: May have comorbidities impacting occupational performance    Modification or Assistance to Complete Evaluation  No modification of tasks or assist necessary to complete eval    OT Frequency 2x / week  OT Duration 12 weeks    OT Treatment/Interventions Self-care/ADL training;Therapeutic exercise;DME and/or AE instruction;Balance training;Neuromuscular education;Energy conservation;Therapeutic activities;Patient/family education;Coping strategies training    OT Home Exercise Plan theraputty increased to blue and instructed in exercises today    Consulted and Agree with Plan of Care Patient             Patient will benefit from skilled therapeutic intervention in order to improve the following deficits and impairments:   Body Structure / Function / Physical Skills: ADL, Dexterity, Strength, Balance, Coordination, FMC, IADL, Endurance, Sensation, UE functional use, GMC       Visit Diagnosis: Muscle weakness (generalized)  Other lack of coordination  Cerebellar stroke [facility])    Problem List Patient Active Problem List   Diagnosis Date Noted   Vitamin D deficiency 10/04/2020   Mediastinal adenopathy 10/04/2020   Hyponatremia    Transaminitis    Diabetes mellitus, new onset ([place])    Malnutrition of moderate degree 09/13/2020   Stroke ([place]) 09/12/2020   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

## 2020-11-11 NOTE — Therapy (Signed)
[place] MAIN [facility] SERVICES [address] [place], [place], [address] Phone: [phone]   Fax:  [phone]  Physical Therapy Treatment  Patient Details  Name: Malik Jones MRN: [id number] Date of Birth: Nov 12, 1960 No data recorded  Encounter Date: 11/11/2020   PT End of Session - 11/11/20 0903     Visit Number 11    Number of Visits 24    Date for PT Re-Evaluation 12/31/20    Authorization - Visit Number 11    Progress Note Due on Visit 20    PT Start Time 0849    PT Stop Time 0930    PT Time Calculation (min) 41 min    Equipment Utilized During Treatment Gait belt    Activity Tolerance Patient tolerated treatment well    Behavior During Therapy WFL for tasks assessed/performed             Past Medical History:  Diagnosis Date   Medical history non-contributory     Past Surgical History:  Procedure Laterality Date   MANDIBLE FRACTURE SURGERY     NO PAST SURGERIES      There were no vitals filed for this visit.   Subjective Assessment - 11/11/20 0849     Subjective Patient presents without his walker. Reports no falls or LOB since last session. No changes to medication. Goes to doctor tomorrow.    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 Infarct occured on 09/11/20; no significant PMH. He attended inpatient rehab services at [facility] for 2 weeks and d/c from inpatient 10 days ago. He lived with his brother for 1 week before moving back into his own home. He is now living independently with abundant family check-ins. He is currently not driving or employed. He states his employer has had to post his job during this time. Pt reports 1 fall immediately after stroke, otherwise no history of falls. Only PT he has recieved has been post-infarct. Pt arrivied with RW however  states he rarely uses it and does nto use it in the home.    Limitations House hold activities;Walking;Lifting;Writing    How long can you sit comfortably? unaffected    How long can you stand comfortably? states standing is not a problem    How long can you walk comfortably? states walking is not a problem    Patient Stated Goals return to work and driving, be fully independent as he was before cerebellar infarct    Currently in Pain? No/denies                         INTERVENTIONS: Goal: Mini Best: 20/28     Neuromuscular- re-ed:   airex pad: -soccer ball circles 10x each LE placement ; 2 sets with second set with decreased UE support. Very challenging for RLE coordination/spatial awareness  -modified tandem on dynadisc 30 second holds each LE ; x2 sets   Sit to stand with jump at top of stand 10x;   ambulate in hallway: -horizontal head turns with cues for reading alphabet from cards 86 ftx 2 sets -horizontal head turns with cues for reading number and symbols from cards 86 ft x 2 sets  -"red light/green light" for sudden initiation/termination of ambulation with close CGA for carryover to natural environment 2x 86 ft ;  Pt educated throughout session about proper posture and technique with exercises. Improved exercise technique, movement at target joints, use of target muscles after min to mod verbal, visual, tactile cues.     Patient had right foot internal rotation with dual task. Patient tolerated Mini Best test with occasional dysfunction of RLE noted; scoring a 20/28. Deficits noted with single limb stance of RLE, dual task, weight shift outside BOS, and limited center of mass stabilization. Patient will continue to benefit from physical therapy intervention in order to improve his lower extremity strength, improve his balance, improve his ambulatory capacity, and improve his overall quality of life.          PT Education - 11/11/20 0903      Education Details exercise technique, mini best test    Person(s) Educated Patient    Methods Explanation;Demonstration;Tactile cues;Verbal cues    Comprehension Verbalized understanding;Returned demonstration;Verbal cues required;Tactile cues required              PT Short Term Goals - 11/08/20 0808       PT SHORT TERM GOAL #1   Title Patient will be independent in home exercise program to improve strength/mobility for better functional independence with ADLs.    Time 6    Period Weeks    Status Achieved    Target Date 11/19/20               PT Long Term Goals - 11/11/20 1003       PT LONG TERM GOAL #1   Title Patient will increase FOTO score to equal to or greater than 77 to demonstrate statistically significant improvement in mobility and quality of life.    Baseline 10/08/20: 63, 9/30: 67    Time 12    Period Weeks    Status On-going    Target Date 12/31/20      PT LONG TERM GOAL #2   Title Pt will improve BERG by at least 3 points in order to demonstrate clinically significant improvement in balance.    Baseline 42 10/11/20    Time 8    Period Weeks    Status Achieved      PT LONG TERM GOAL #3   Title Patient will reduce timed up and go to <11 seconds to reduce fall risk and demonstrate improved transfer/gait ability.    Baseline 10/08/20: 12.68 seconds;10.3 sec    Time 12    Period Weeks    Status Achieved      PT LONG TERM GOAL #4   Title Patient will increase six minute walk test distance to >1000 for progression to community ambulator and improve gait ability    Baseline 10/08/20: 994f    Time 12    Period Weeks    Status Partially Met    Target Date 12/31/20      PT LONG TERM GOAL #5   Title Patient will increase Functional Gait Assessment score to >23/30 as to reduce fall risk and improve dynamic gait safety with community ambulation.    Baseline 10/15/20: 14/30. 20/30 9/30, revised goal this session    Time 12    Period Weeks    Status Revised     Target Date 12/31/20      PT LONG TERM GOAL #6   Title Pt will improve score on mini BEST test by 4 points or greater in order to indicate improved balance and decreased risk of falls.    Baseline to be assessed visit 11 10/3:  20/28    Time 7    Period Weeks    Status New    Target Date 12/27/20                   Plan - 11/11/20 1007     Clinical Impression Statement Patient had right foot internal rotation with dual task. Patient tolerated Mini Best test with occasional dysfunction of RLE noted; scoring a 20/28. Deficits noted with single limb stance of RLE, dual task, weight shift outside BOS, and limited center of mass stabilization. Patient will continue to benefit from physical therapy intervention in order to improve his lower extremity strength, improve his balance, improve his ambulatory capacity, and improve his overall quality of life.    Personal Factors and Comorbidities Age;Comorbidity 1;Comorbidity 2    Comorbidities HTN , DM    Examination-Activity Limitations Locomotion Level;Transfers;Stairs    Examination-Participation Restrictions Driving;Occupation;Community Activity;Yard Work    Stability/Clinical Decision Making Stable/Uncomplicated    Rehab Potential Excellent    PT Frequency 2x / week    PT Duration 12 weeks    PT Treatment/Interventions ADLs/Self Care Home Management;Aquatic Therapy;Biofeedback;Cryotherapy;Electrical Stimulation;Moist Heat;Traction;Ultrasound;Gait training;DME Instruction;Stair training;Functional mobility training;Therapeutic activities;Neuromuscular re-education;Balance training;Therapeutic exercise;Patient/family education;Manual techniques    PT Next Visit Plan begin dynamc gait training activity, continue with LE strengthening program    PT Home Exercise Plan Provided Access Code: [email or web address]  URL: [email or web address]  Date: 10/11/2020  Prepared by: Rivka Barbara    Consulted and Agree with Plan of Care Patient              Patient will benefit from skilled therapeutic intervention in order to improve the following deficits and impairments:  Abnormal gait, Decreased activity tolerance, Decreased endurance, Impaired sensation, Improper body mechanics, Decreased balance, Decreased coordination, Decreased mobility, Decreased safety awareness, Difficulty walking  Visit Diagnosis: Muscle weakness (generalized)  Unsteadiness on feet  Abnormality of gait and mobility     Problem List Patient Active Problem List   Diagnosis Date Noted   Vitamin D deficiency 10/04/2020   Mediastinal adenopathy 10/04/2020   Hyponatremia    Transaminitis    Diabetes mellitus, new onset ([place])    Malnutrition of moderate degree 09/13/2020   Stroke ([place]) 09/12/2020   Aortic thrombus ([place])    Hyperlipidemia    Essential hypertension    Impaired fasting glucose    Cerebellar stroke ([place]) 09/11/2020    Janna Arch, PT, DPT  11/11/2020, 10:08 AM  [place] MAIN [facility] SERVICES [address] [place], [place], [address] Phone: [phone]   Fax:  [phone]  Name: Kwamaine Cuppett MRN: [id number] Date of Birth: May 21, 1960

## 2020-11-12 ENCOUNTER — Other Ambulatory Visit: Payer: Self-pay

## 2020-11-12 ENCOUNTER — Encounter
Payer: BC Managed Care – PPO | Attending: Physical Medicine and Rehabilitation | Admitting: Physical Medicine and Rehabilitation

## 2020-11-12 VITALS — BP 139/91 | HR 81 | Temp 98.3°F | Ht 65.0 in | Wt 148.6 lb

## 2020-11-12 DIAGNOSIS — I10 Essential (primary) hypertension: Secondary | ICD-10-CM | POA: Diagnosis not present

## 2020-11-12 DIAGNOSIS — I639 Cerebral infarction, unspecified: Secondary | ICD-10-CM | POA: Insufficient documentation

## 2020-11-12 MED ORDER — APIXABAN 5 MG PO TABS: 5.0000 mg | ORAL_TABLET | Freq: Two times a day (BID) | ORAL | 0 refills | Status: DC

## 2020-11-12 MED ORDER — AMLODIPINE BESYLATE 10 MG PO TABS: 10.0000 mg | ORAL_TABLET | Freq: Every day | ORAL | 1 refills | Status: DC

## 2020-11-12 MED ORDER — HYDROCHLOROTHIAZIDE 12.5 MG PO CAPS: 37.5000 mg | ORAL_CAPSULE | Freq: Every day | ORAL | 1 refills | Status: DC

## 2020-11-12 MED ORDER — ATORVASTATIN CALCIUM 40 MG PO TABS: 40.0000 mg | ORAL_TABLET | Freq: Every day | ORAL | 1 refills | Status: DC

## 2020-11-12 NOTE — Progress Notes (Signed)
Subjective:    Patient ID: Malik Jones, male    DOB: 03-May-1960, 60 y.o.   MRN: [id number]  HPI Malik Jones is a 60 year old man who presents for follow-up after CIR admission for stroke.  He has been doing therapy at  [facility] twice per week  He tries to eat with his right hand but still has tremor which limits this. His son gave him a weighted mug and this greatly helps.  He has overall been doing well at home  He has tried to eat healthier at home. Has only been eating out a couple times per week.   Denies pain.  He has the support of his son and niece and sister.   He does not need any refills today.   Pain Inventory Average Pain 0 Pain Right Now 0 My pain is  no pain  BOWEL Number of stools per week: 4  BLADDER Normal   Mobility walk without assistance do you drive?  no  Function not employed: date last employed 09/10/20 disabled: date disabled 09/11/20  Neuro/Psych No problems in this area  Prior Studies Any changes since last visit?  no  Physicians involved in your care Any changes since last visit?  no   Family History  Problem Relation Age of Onset   Kidney disease Mother    Hip fracture Father    Social History   Socioeconomic History   Marital status: Single    Spouse name: Not on file   Number of children: Not on file   Years of education: Not on file   Highest education level: Not on file  Occupational History   Not on file  Tobacco Use   Smoking status: Never   Smokeless tobacco: Never  Vaping Use   Vaping Use: Never used  Substance and Sexual Activity   Alcohol use: Yes   Drug use: Yes    Types: Marijuana   Sexual activity: Not Currently  Other Topics Concern   Not on file  Social History Narrative   Not on file   Social Determinants of Health   Financial Resource Strain: Not on file  Food Insecurity: Not on file  Transportation Needs: Not on file  Physical Activity: Not on file  Stress: Not on file  Social  Connections: Not on file   Past Surgical History:  Procedure Laterality Date   MANDIBLE FRACTURE SURGERY     NO PAST SURGERIES     Past Medical History:  Diagnosis Date   Medical history non-contributory    BP (!) 139/91   Pulse 81   Temp 98.3 F (36.8 C)   Ht 5\' 5"  (1.651 m)   Wt 148 lb 9.6 oz (67.4 kg)   SpO2 97%   BMI 24.73 kg/m   Opioid Risk Score:   Fall Risk Score:  `1  Depression screen PHQ 2/9  Depression screen [facility] 2/9 11/12/2020 11/07/2020  Decreased Interest 2 0  Down, Depressed, Hopeless 0 0  PHQ - 2 Score 2 0  Altered sleeping 0 -  Tired, decreased energy 0 -  Change in appetite 0 -  Feeling bad or failure about yourself  0 -  Trouble concentrating 0 -  Moving slowly or fidgety/restless 1 -  Suicidal thoughts 0 -  PHQ-9 Score 3 -     Review of Systems  Constitutional: Negative.   HENT: Negative.    Eyes: Negative.   Respiratory: Negative.    Cardiovascular: Negative.   Gastrointestinal: Negative.  Endocrine: Negative.   Genitourinary: Negative.   Musculoskeletal:        Some difficulty writing with right hand  Skin: Negative.   Allergic/Immunologic: Negative.   Neurological: Negative.   Hematological:  Bruises/bleeds easily.       Eliquis  Psychiatric/Behavioral: Negative.    All other systems reviewed and are negative.     Objective:   Physical Exam Gen: no distress, normal appearing HEENT: oral mucosa pink and moist, NCAT Cardio: Reg rate Chest: normal effort, normal rate of breathing Abd: soft, non-distended Ext: no edema Psych: pleasant, normal affect Skin: intact Neuro: Alert and oriented.  Musculoskeletal: 4/5 strength right knee extension. 5/5 throughout.     Assessment & Plan:  Malik Jones is a 60 year old man who presents for hospital follow-up after CIR admission after CVA.  1) CVA -continue therapies -provided dietary and exercise counseling -discussed that hypertension is number one reversible risk factor for  stroke.   -refilled Eliquis and statin -encouraged attending stroke support group -cleared to return to driving  2) HTN: -BP is 139/91 today.  -Advised checking BP daily at home and logging results to bring into follow-up appointment with her PCP and myself. -Reviewed BP meds today.  -refilled amlodipine and HCTZ -Advised regarding healthy foods that can help lower blood pressure and provided with a list: 1) citrus foods- high in vitamins and minerals 2) salmon and other fatty fish - reduces inflammation and oxylipins 3) swiss chard (leafy green)- high level of nitrates 4) pumpkin seeds- one of the best natural sources of magnesium 5) Beans and lentils- high in fiber, magnesium, and potassium 6) Berries- high in flavonoids 7) Amaranth (whole grain, can be cooked similarly to rice and oats)- high in magnesium and fiber 8) Pistachios- even more effective at reducing BP than other nuts 9) Carrots- high in phenolic compounds that relax blood vessels and reduce inflammation 10) Celery- contain phthalides that relax tissues of arterial walls 11) Tomatoes- can also improve cholesterol and reduce risk of heart disease 12) Broccoli- good source of magnesium, calcium, and potassium 13) Greek yogurt: high in potassium and calcium 14) Herbs and spices: Celery seed, cilantro, saffron, lemongrass, black cumin, ginseng, cinnamon, cardamom, sweet basil, and ginger 15) Chia and flax seeds- also help to lower cholesterol and blood sugar 16) Beets- high levels of nitrates that relax blood vessels  17) spinach and bananas- high in potassium  -Provided lise of supplements that can help with hypertension:  1) magnesium: one high quality brand is Bioptemizers since it contains all 7 types of magnesium, otherwise over the counter magnesium gluconate 400mg  is a good option 2) B vitamins 3) vitamin D 4) potassium 5) CoQ10 6) L-arginine 7) Vitamin C 8) Beetroot -Educated that goal BP is 120/80. -Made  goal to incorporate some of the above foods into diet.

## 2020-11-12 NOTE — Patient Instructions (Signed)
HTN:  -Advised regarding healthy foods that can help lower blood pressure and provided with a list: 1) citrus foods- high in vitamins and minerals 2) salmon and other fatty fish - reduces inflammation and oxylipins 3) swiss chard (leafy green)- high level of nitrates 4) pumpkin seeds- one of the best natural sources of magnesium 5) Beans and lentils- high in fiber, magnesium, and potassium 6) Berries- high in flavonoids 7) Amaranth (whole grain, can be cooked similarly to rice and oats)- high in magnesium and fiber 8) Pistachios- even more effective at reducing BP than other nuts 9) Carrots- high in phenolic compounds that relax blood vessels and reduce inflammation 10) Celery- contain phthalides that relax tissues of arterial walls 11) Tomatoes- can also improve cholesterol and reduce risk of heart disease 12) Broccoli- good source of magnesium, calcium, and potassium 13) Greek yogurt: high in potassium and calcium 14) Herbs and spices: Celery seed, cilantro, saffron, lemongrass, black cumin, ginseng, cinnamon, cardamom, sweet basil, and ginger 15) Chia and flax seeds- also help to lower cholesterol and blood sugar 16) Beets- high levels of nitrates that relax blood vessels  17) spinach and bananas- high in potassium  -Provided lise of supplements that can help with hypertension:  1) magnesium: one high quality brand is Bioptemizers since it contains all 7 types of magnesium, otherwise over the counter magnesium gluconate 400mg  is a good option 2) B vitamins 3) vitamin D 4) potassium 5) CoQ10 6) L-arginine 7) Vitamin C 8) Beetroot -Educated that goal BP is 120/80. -Made goal to incorporate some of the above foods into diet.

## 2020-11-13 ENCOUNTER — Ambulatory Visit: Payer: BC Managed Care – PPO

## 2020-11-13 ENCOUNTER — Ambulatory Visit: Payer: BC Managed Care – PPO | Admitting: Physical Therapy

## 2020-11-13 VITALS — BP 121/84 | HR 63

## 2020-11-13 DIAGNOSIS — R278 Other lack of coordination: Secondary | ICD-10-CM

## 2020-11-13 DIAGNOSIS — R269 Unspecified abnormalities of gait and mobility: Secondary | ICD-10-CM

## 2020-11-13 DIAGNOSIS — M6281 Muscle weakness (generalized): Secondary | ICD-10-CM | POA: Diagnosis not present

## 2020-11-13 DIAGNOSIS — R2689 Other abnormalities of gait and mobility: Secondary | ICD-10-CM | POA: Diagnosis not present

## 2020-11-13 DIAGNOSIS — R2681 Unsteadiness on feet: Secondary | ICD-10-CM | POA: Diagnosis not present

## 2020-11-13 DIAGNOSIS — I639 Cerebral infarction, unspecified: Secondary | ICD-10-CM

## 2020-11-13 DIAGNOSIS — R262 Difficulty in walking, not elsewhere classified: Secondary | ICD-10-CM | POA: Diagnosis not present

## 2020-11-13 NOTE — Therapy (Signed)
[place] [facility] SERVICES [address] [place], [place], [address] Phone: [phone]   Fax:  [phone]  Occupational Therapy Treatment  Patient Details  Name: Malik Jones MRN: [id number] Date of Birth: 05-18-1960 Referring Provider (OT): Dr. Leeroy Cha   Encounter Date: 11/13/2020   OT End of Session - 11/13/20 1253     Visit Number 12    Number of Visits 24    Date for OT Re-Evaluation 12/30/20    Authorization Time Period Reporting period starting 10/08/2020    OT Start Time 0830    OT Stop Time 0915    OT Time Calculation (min) 45 min    Activity Tolerance Patient tolerated treatment well    Behavior During Therapy [facility] for tasks assessed/performed             Past Medical History:  Diagnosis Date   Medical history non-contributory     Past Surgical History:  Procedure Laterality Date   MANDIBLE FRACTURE SURGERY     NO PAST SURGERIES      There were no vitals filed for this visit.   Subjective Assessment - 11/13/20 0846     Subjective  Pt had a good visit with Dr. Ranell Patrick yesterday and was pleased to report he was cleared to drive.    Pertinent History cerebellar stroke    Limitations balance, R sided weakness and ataxia    Patient Stated Goals "I want to get back to normal."           Occupational Therapy Treatment: Neuro re-ed: Participated in [place] game to address R hand small object manipulation with pt having to scoop up a handful of small stones from dish and discard from palm of hand one at a time into a row of dishes.    Self Care: Self feeding addressed with standard spoon in R hand and hand to mouth patterns with bowl of cereal.  Vc for positioning R elbow on table to promote more distal control of spoon in hand.  Practiced holding bowl of cereal with L hand to position bowl closer to mouth, but control of spoon to mouth was still limited despite shorter distance from bowl to mouth.  Vc to  maintain slow pace for hand to mouth patterns to increase control.   Response to Treatment: Frequent dropping of small stones when picking up stones from dish with R hand.  Occasional dropping of stones when attempting to discard them 1 at a time from palm of hand, and occasional unintentional movement of stones on game board d/t RUE ataxia.  Overall, pt able to use RUE for small item pickup/object manipulation to play table top game with 75% accuracy.  Pt demonstrated minimal spilling for self feeding cereal this day using R hand and standard spoon.  Extra time is needed to ensure slow hand to mouth patterns; minimal ataxia noted with proximal stabilization with R elbow on table which allowed more distal control during hand to mouth patterns.  Will continue to address RUE ataxia in order to maximize RUE engagement with self care.     OT Education - 11/13/20 1252     Education Details self feeding compensatory strategies to engage R hand    Person(s) Educated Patient    Methods Explanation;Demonstration;Verbal cues    Comprehension Verbalized understanding;Verbal cues required;Need further instruction;Returned demonstration              OT Short Term Goals - 11/08/20 [phone]  OT SHORT TERM GOAL #1   Title Pt will be indep with HEP for RUE strength/coordination    Baseline Eval: HEP initiated at eval, will continue to add on; indep with HEP    Time 6    Period Weeks    Status Achieved    Target Date 11/18/20               OT Long Term Goals - 11/08/20 1618       OT LONG TERM GOAL #1   Title Pt will increase FOTO score to 60 or better to indicate increased functional performance.    Baseline Eval: FOTO 52; 11/08/20: FOTO score 67    Time 12    Period Weeks    Status On-going    Target Date 12/30/20      OT LONG TERM GOAL #2   Title Pt will increase R grip strength to 80 lbs or better to improve carrying heavier ADL supplies with R dominant hand.    Baseline Eval: R  grip 65 lbs (L non-dominant 76 lbs); 11/08/20: R grip 79 lbs    Time 12    Period Weeks    Status Partially Met    Target Date 12/30/20      OT LONG TERM GOAL #3   Title Pt will improve ability to pick up and manipulate small ADL supplies without dropping with R dominant hand as demonstrated by improved 9 hole peg test score to <30 sec or better.    Baseline Eval: R 9 hole peg test 56.3 sec (L non-dominant 20.6 sec); 11/08/2020: 9 hole peg test 48 sec    Time 12    Period Weeks    Status Partially Met    Target Date 12/30/20      OT LONG TERM GOAL #4   Title Pt will improve R GMC as demonstrated by ability to brush teeth, shave, eat with R dominant hand.    Baseline Eval: Pt using L non-dominant hand to brush teeth, shave, eat d/t moderate ataxia throughout RUE; 11/08/20: Pt still brushing teeth with L hand, son shaves him, but pt can eat finger foods with R hand.  Pt can self feed apple sauce with spoon in R hand with moderate ataxia, some spilling.    Time 12    Period Weeks    Status On-going    Target Date 12/30/20                   Plan - 11/13/20 1306     Clinical Impression Statement Frequent dropping of small stones when picking up stones from dish with R hand.  Occasional dropping of stones when attempting to discard them 1 at a time from palm of hand, and occasional unintentional movement of stones on game board d/t RUE ataxia.  Overall, pt able to use RUE for small item pickup/object manipulation to play table top game with 75% accuracy.  Pt demonstrated minimal spilling for self feeding cereal this day using R hand and standard spoon.  Extra time is needed to ensure slow hand to mouth patterns; minimal ataxia noted with proximal stabilization with R elbow on table which allowed more distal control during hand to mouth patterns.  Will continue to address RUE ataxia in order to maximize RUE engagement with self care.    OT Occupational Profile and History Detailed  Assessment- Review of Records and additional review of physical, cognitive, psychosocial history related to current functional performance    Occupational  performance deficits (Please refer to evaluation for details): ADL's;IADL's;Leisure    Body Structure / Function / Physical Skills ADL;Dexterity;Strength;Balance;Coordination;FMC;IADL;Endurance;Sensation;UE functional use;GMC    Rehab Potential Excellent    Clinical Decision Making Several treatment options, min-mod task modification necessary    Comorbidities Affecting Occupational Performance: May have comorbidities impacting occupational performance    Modification or Assistance to Complete Evaluation  No modification of tasks or assist necessary to complete eval    OT Frequency 2x / week    OT Duration 12 weeks    OT Treatment/Interventions Self-care/ADL training;Therapeutic exercise;DME and/or AE instruction;Balance training;Neuromuscular education;Energy conservation;Therapeutic activities;Patient/family education;Coping strategies training    OT Home Exercise Plan theraputty increased to blue and instructed in exercises today    Consulted and Agree with Plan of Care Patient             Patient will benefit from skilled therapeutic intervention in order to improve the following deficits and impairments:   Body Structure / Function / Physical Skills: ADL, Dexterity, Strength, Balance, Coordination, FMC, IADL, Endurance, Sensation, UE functional use, GMC       Visit Diagnosis: Muscle weakness (generalized)  Other lack of coordination  Cerebellar stroke [facility])    Problem List Patient Active Problem List   Diagnosis Date Noted   Vitamin D deficiency 10/04/2020   Mediastinal adenopathy 10/04/2020   Hyponatremia    Transaminitis    Diabetes mellitus, new onset ([place])    Malnutrition of moderate degree 09/13/2020   Stroke ([place]) 09/12/2020   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

## 2020-11-13 NOTE — Therapy (Signed)
[place] [facility] SERVICES [address] [place], [place], [address] Phone: [phone]   Fax:  [phone]  Physical Therapy Treatment  Patient Details  Name: Malik Jones MRN: [id number] Date of Birth: [birth year] No data recorded  Encounter Date: 11/13/2020   PT End of Session - 11/13/20 0901     Number of Visits 24    Date for PT Re-Evaluation 12/31/20    Authorization - Visit Number 11    Progress Note Due on Visit 20    PT Start Time 0930    Equipment Utilized During Treatment Gait belt    Activity Tolerance Patient tolerated treatment well    Behavior During Therapy [facility] for tasks assessed/performed             Past Medical History:  Diagnosis Date   Medical history non-contributory     Past Surgical History:  Procedure Laterality Date   MANDIBLE FRACTURE SURGERY     NO PAST SURGERIES      Vitals:   11/13/20 0919  BP: 121/84  Pulse: 63     Subjective Assessment - 11/13/20 0900     Subjective Pt reports MD is allowing him to drive. Also would like him to get in habit of checking his blood pressure daily due to slightly elevated value at previous appointment.    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 Infarct occured on 09/11/20; no significant PMH. He attended inpatient rehab services at [facility] for 2 weeks and d/c from inpatient 10 days ago. He lived with his brother for 1 week before moving back into his own home. He is now living independently with abundant family check-ins. He is currently not driving or employed. He states his employer has had to post his job during this time. Pt reports 1 fall immediately after stroke, otherwise no history of falls. Only PT he has recieved has been post-infarct. Pt arrivied with RW however states he rarely uses it and  does nto use it in the home.    Limitations House hold activities;Walking;Lifting;Writing    How long can you sit comfortably? unaffected    How long can you stand comfortably? states standing is not a problem    How long can you walk comfortably? states walking is not a problem    Patient Stated Goals return to work and driving, be fully independent as he was before cerebellar infarct               Treatment provided this session     Neuro Re- Ed:   Airex step up leading with R LE 2*15 - difficulty with coordination and foot clearance but improved with repetitions   Airex side step up x 10 bilaterally (10* ea side)  -no LOB, 1 instance of R foot drag but overall door R LE clearance   Cone taps on 6 inch step to improve coordination and R LE control  X 1 min each round for 3 rounds -tapping different color cones on command (PT instruction)  -Increased difficulty with cone on left side   360 degree turns on airex 1 x, no Lob noted -added basketball dribble with turns in order to incorporate dual motor task. Pt able to dribble with left hand and no LOB noted, fatigue noted at end of bout.  -CGA provided   There Act:  STS x  10, no UE assist  STS with jump x 10  -to improve ease with transfers and transitions  5 min continuous ambulation: -Walking red light green light, 1 incidence of difficulty with quick stop utilizing R LE.  -Walking with turns, sidestepping, backward walking, etc. Cues for picking up right foot with retro walking.   -Walking with dual task (naming football players) Difficulty with naming foootball players and states would be much easier without walking, no LOB.        Pt required occasional rest breaks due fatigue, PT was quick to ask when pt appeared to be fatiguing in order to prevent excessive fatigue. Unless otherwise stated, SBA was provided and gait belt donned in order to ensure pt safety Pt educated throughout session about proper  posture and technique with exercises. Improved exercise technique, movement at target joints, use of target muscles after min to mod verbal, visual, tactile cues.                          PT Education - 11/13/20 0900     Education Details Exercise form and technique    Person(s) Educated Patient    Methods Explanation    Comprehension Verbalized understanding;Returned demonstration;Verbal cues required;Tactile cues required              PT Short Term Goals - 11/08/20 0808       PT SHORT TERM GOAL #1   Title Patient will be independent in home exercise program to improve strength/mobility for better functional independence with ADLs.    Time 6    Period Weeks    Status Achieved    Target Date 11/19/20               PT Long Term Goals - 11/11/20 1003       PT LONG TERM GOAL #1   Title Patient will increase FOTO score to equal to or greater than 77 to demonstrate statistically significant improvement in mobility and quality of life.    Baseline 10/08/20: 63, 9/30: 67    Time 12    Period Weeks    Status On-going    Target Date 12/31/20      PT LONG TERM GOAL #2   Title Pt will improve BERG by at least 3 points in order to demonstrate clinically significant improvement in balance.    Baseline 42 10/11/20    Time 8    Period Weeks    Status Achieved      PT LONG TERM GOAL #3   Title Patient will reduce timed up and go to <11 seconds to reduce fall risk and demonstrate improved transfer/gait ability.    Baseline 10/08/20: 12.68 seconds;10.3 sec    Time 12    Period Weeks    Status Achieved      PT LONG TERM GOAL #4   Title Patient will increase six minute walk test distance to >1000 for progression to community ambulator and improve gait ability    Baseline 10/08/20: 923f    Time 12    Period Weeks    Status Partially Met    Target Date 12/31/20      PT LONG TERM GOAL #5   Title Patient will increase Functional Gait Assessment score to  >23/30 as to reduce fall risk and improve dynamic gait safety with community ambulation.    Baseline 10/15/20: 14/30. 20/30 9/30, revised goal this session    Time 12  Period Weeks    Status Revised    Target Date 12/31/20      PT LONG TERM GOAL #6   Title Pt will improve score on mini BEST test by 4 points or greater in order to indicate improved balance and decreased risk of falls.    Baseline to be assessed visit 11 10/3: 20/28    Time 7    Period Weeks    Status New    Target Date 12/27/20                   Plan - 11/13/20 0901     Clinical Impression Statement Continued with current plan of care as laid out in evaluation and recent prior sessions. Pt remains motivated to advance progress toward goals in order to maximize independence and safety at home. Pt requires high level assistance and cuing for completion of exercises in order to provide adequate level of stimulation and perturbation. Author allows pt as much opportunity as possible to perform independent righting strategies, only stepping in when pt is unable to prevent falling to floor. Pt closely monitored throughout session for safe vitals response and to maximize patient safety during interventions. Pt had difficulty with dynamic gait tasks today such as quickly coming to stop and changing directions, this improved with repetitions. Pt continues to demonstrate progress toward goals AEB progression of some interventions this date either in volume or intensity.    Personal Factors and Comorbidities Age;Comorbidity 1;Comorbidity 2    Comorbidities HTN , DM    Examination-Activity Limitations Locomotion Level;Transfers;Stairs    Examination-Participation Restrictions Driving;Occupation;Community Activity;Yard Work    Stability/Clinical Decision Making Stable/Uncomplicated    Rehab Potential Excellent    PT Frequency 2x / week    PT Duration 12 weeks    PT Treatment/Interventions ADLs/Self Care Home Management;Aquatic  Therapy;Biofeedback;Cryotherapy;Electrical Stimulation;Moist Heat;Traction;Ultrasound;Gait training;DME Instruction;Stair training;Functional mobility training;Therapeutic activities;Neuromuscular re-education;Balance training;Therapeutic exercise;Patient/family education;Manual techniques    PT Next Visit Plan begin dynamc gait training activity, continue with LE strengthening program    PT Home Exercise Plan Provided Access Code: [email or web address]  URL: [email or web address]  Date: 10/11/2020  Prepared by: Rivka Barbara    Consulted and Agree with Plan of Care Patient             Patient will benefit from skilled therapeutic intervention in order to improve the following deficits and impairments:  Abnormal gait, Decreased activity tolerance, Decreased endurance, Impaired sensation, Improper body mechanics, Decreased balance, Decreased coordination, Decreased mobility, Decreased safety awareness, Difficulty walking  Visit Diagnosis: Muscle weakness (generalized)  Unsteadiness on feet  Abnormality of gait and mobility  Difficulty in walking, not elsewhere classified  Other abnormalities of gait and mobility     Problem List Patient Active Problem List   Diagnosis Date Noted   Vitamin D deficiency 10/04/2020   Mediastinal adenopathy 10/04/2020   Hyponatremia    Transaminitis    Diabetes mellitus, new onset ([place])    Malnutrition of moderate degree 09/13/2020   Stroke ([place]) 09/12/2020   Aortic thrombus ([place])    Hyperlipidemia    Essential hypertension    Impaired fasting glucose    Cerebellar stroke ([place]) 09/11/2020    Particia Lather, PT 11/13/2020, 10:05 AM  [place] [address] [place], [place], [address] Phone: [phone]   Fax:  [phone]  Name: Malik Jones MRN: [id number] Date of Birth: Jun 25, 1960

## 2020-11-19 ENCOUNTER — Ambulatory Visit: Payer: BC Managed Care – PPO | Admitting: Physical Therapy

## 2020-11-19 ENCOUNTER — Ambulatory Visit: Payer: BC Managed Care – PPO

## 2020-11-19 ENCOUNTER — Other Ambulatory Visit: Payer: Self-pay

## 2020-11-19 DIAGNOSIS — R262 Difficulty in walking, not elsewhere classified: Secondary | ICD-10-CM

## 2020-11-19 DIAGNOSIS — R2689 Other abnormalities of gait and mobility: Secondary | ICD-10-CM | POA: Diagnosis not present

## 2020-11-19 DIAGNOSIS — I639 Cerebral infarction, unspecified: Secondary | ICD-10-CM

## 2020-11-19 DIAGNOSIS — R269 Unspecified abnormalities of gait and mobility: Secondary | ICD-10-CM

## 2020-11-19 DIAGNOSIS — M6281 Muscle weakness (generalized): Secondary | ICD-10-CM

## 2020-11-19 DIAGNOSIS — R2681 Unsteadiness on feet: Secondary | ICD-10-CM

## 2020-11-19 DIAGNOSIS — R278 Other lack of coordination: Secondary | ICD-10-CM | POA: Diagnosis not present

## 2020-11-19 NOTE — Therapy (Signed)
[place] MAIN [facility] SERVICES [address] [place], [place], [address] Phone: [phone]   Fax:  [phone]  Physical Therapy Treatment  Patient Details  Name: Malik Jones MRN: [id number] Date of Birth: [birth year] No data recorded  Encounter Date: 11/19/2020   PT End of Session - 11/19/20 0807     Visit Number 13    Number of Visits 24    Date for PT Re-Evaluation 12/31/20    Authorization - Visit Number 12    Authorization - Number of Visits 24    Progress Note Due on Visit 20    Equipment Utilized During Treatment Gait belt    Activity Tolerance Patient tolerated treatment well    Behavior During Therapy [facility] for tasks assessed/performed             Past Medical History:  Diagnosis Date   Medical history non-contributory     Past Surgical History:  Procedure Laterality Date   MANDIBLE FRACTURE SURGERY     NO PAST SURGERIES      There were no vitals filed for this visit.   Subjective Assessment - 11/19/20 0806     Subjective Pt reports no changes since last session. Reports his driving has been making him more independent with regular activities.    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 Infarct occured on 09/11/20; no significant PMH. He attended inpatient rehab services at [facility] for 2 weeks and d/c from inpatient 10 days ago. He lived with his brother for 1 week before moving back into his own home. He is now living independently with abundant family check-ins. He is currently not driving or employed. He states his employer has had to post his job during this time. Pt reports 1 fall immediately after stroke, otherwise no history of falls. Only PT he has recieved has been post-infarct. Pt arrivied with RW however states he rarely uses it and does nto use it in  the home.    Limitations House hold activities;Walking;Lifting;Writing    How long can you sit comfortably? unaffected    How long can you stand comfortably? states standing is not a problem    How long can you walk comfortably? states walking is not a problem    Patient Stated Goals return to work and driving, be fully independent as he was before cerebellar infarct    Currently in Pain? No/denies             Treatment provided this session   Neuro Re- Ed:   Heel to toe rocks x 25 each - increased difficulty with heel rocks copmared to toe rocks -cues for keeping trunk upright as he tends to compensate with hip strategy   Tandem walking x 1 min in // bars  - difficulty with obtaining full tandem balance, typically reverts to semitandem  -No loss of balance noted, signs of instability noted and difficulty with proper foot placement noted as above.  Balance course with dual motor task of navigating obstacle course and dribbling basketball.  2 sets of 2 laps through obstacle course -pt able to recover balancewell when thrown off by basketall, unable to dribble with involved (right side)   Standing on Airex pad with ball toss towards wall and 90 degree turn x8 repetitions on each wall. -No loss of balance noted, not appear to be extremity difficult  for patient.    There Act:  Ambulation training: Walking for 5 min with [employer] stopping, turing, retro walking, sidestepping, and various turning transitions in order to challenge patient balance. Most difficulty transition was from forward to retro ambulaiton.   STS with jump: to improve muscular power, strength and transfers from lower surfaces  1x 10, 1 LOB and pt recovered by sitting in chair  STS with feet on airex pad 1 x 11  - no LOB, pt rated STS with jump as more difficult     Pt educated throughout session about proper posture and technique with exercises. Improved exercise technique, movement at target  joints, use of target muscles after min to mod verbal, visual, tactile cues.                            PT Education - 11/19/20 0807     Education Details Exercise form and technique    Person(s) Educated Patient    Methods Explanation    Comprehension Verbalized understanding;Tactile cues required;Returned demonstration;Verbal cues required              PT Short Term Goals - 11/08/20 0808       PT SHORT TERM GOAL #1   Title Patient will be independent in home exercise program to improve strength/mobility for better functional independence with ADLs.    Time 6    Period Weeks    Status Achieved    Target Date 11/19/20               PT Long Term Goals - 11/11/20 1003       PT LONG TERM GOAL #1   Title Patient will increase FOTO score to equal to or greater than 77 to demonstrate statistically significant improvement in mobility and quality of life.    Baseline 10/08/20: 63, 9/30: 67    Time 12    Period Weeks    Status On-going    Target Date 12/31/20      PT LONG TERM GOAL #2   Title Pt will improve BERG by at least 3 points in order to demonstrate clinically significant improvement in balance.    Baseline 42 10/11/20    Time 8    Period Weeks    Status Achieved      PT LONG TERM GOAL #3   Title Patient will reduce timed up and go to <11 seconds to reduce fall risk and demonstrate improved transfer/gait ability.    Baseline 10/08/20: 12.68 seconds;10.3 sec    Time 12    Period Weeks    Status Achieved      PT LONG TERM GOAL #4   Title Patient will increase six minute walk test distance to >1000 for progression to community ambulator and improve gait ability    Baseline 10/08/20: 940f    Time 12    Period Weeks    Status Partially Met    Target Date 12/31/20      PT LONG TERM GOAL #5   Title Patient will increase Functional Gait Assessment score to >23/30 as to reduce fall risk and improve dynamic gait safety with community  ambulation.    Baseline 10/15/20: 14/30. 20/30 9/30, revised goal this session    Time 12    Period Weeks    Status Revised    Target Date 12/31/20      PT LONG TERM GOAL #6   Title Pt will improve  score on mini BEST test by 4 points or greater in order to indicate improved balance and decreased risk of falls.    Baseline to be assessed visit 11 10/3: 20/28    Time 7    Period Weeks    Status New    Target Date 12/27/20                   Plan - 11/19/20 0808     Clinical Impression Statement Patient continues to demonstrate great motivation during physical therapy sessions.  Patient continues to practice with high-level ambulation tasks such as walking and increased speeds, turning, changing directions, patient has most difficulty with direction changes from forward to retrowalking.  Patient also continues to work on high-level balance activities, both dynamic and static balance.  Patient demonstrated good ability to navigate obstacle course but made some errors with incorporating dual motor task of dribbling the basketball and managing obstacles.  Patient will continue to benefit from skilled physical therapy intervention in order to improve his balance, improve his ambulatory capacity, and improve his overall function.    Personal Factors and Comorbidities Age;Comorbidity 1;Comorbidity 2    Comorbidities HTN , DM    Examination-Activity Limitations Locomotion Level;Transfers;Stairs    Examination-Participation Restrictions Driving;Occupation;Community Activity;Yard Work    Stability/Clinical Decision Making Stable/Uncomplicated    Rehab Potential Excellent    PT Frequency 2x / week    PT Duration 12 weeks    PT Treatment/Interventions ADLs/Self Care Home Management;Aquatic Therapy;Biofeedback;Cryotherapy;Electrical Stimulation;Moist Heat;Traction;Ultrasound;Gait training;DME Instruction;Stair training;Functional mobility training;Therapeutic activities;Neuromuscular  re-education;Balance training;Therapeutic exercise;Patient/family education;Manual techniques    PT Next Visit Plan begin dynamc gait training activity, continue with LE strengthening program    PT Home Exercise Plan Provided Access Code: [email or web address]  URL: [email or web address]  Date: 10/11/2020  Prepared by: Rivka Barbara    Consulted and Agree with Plan of Care Patient             Patient will benefit from skilled therapeutic intervention in order to improve the following deficits and impairments:  Abnormal gait, Decreased activity tolerance, Decreased endurance, Impaired sensation, Improper body mechanics, Decreased balance, Decreased coordination, Decreased mobility, Decreased safety awareness, Difficulty walking  Visit Diagnosis: Muscle weakness (generalized)  Unsteadiness on feet  Abnormality of gait and mobility  Difficulty in walking, not elsewhere classified  Other abnormalities of gait and mobility     Problem List Patient Active Problem List   Diagnosis Date Noted   Vitamin D deficiency 10/04/2020   Mediastinal adenopathy 10/04/2020   Hyponatremia    Transaminitis    Diabetes mellitus, new onset ([place])    Malnutrition of moderate degree 09/13/2020   Stroke ([place]) 09/12/2020   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 [place], [place], [address] Phone: [phone]   Fax:  [phone]  Name: Lonzell Dorris MRN: [id number] Date of Birth: 08/14/1960

## 2020-11-19 NOTE — Therapy (Signed)
[place] MAIN [facility] SERVICES [address] [place], [place], [address] Phone: [phone]   Fax:  [phone]  Occupational Therapy Treatment  Patient Details  Name: Malik Jones MRN: [id number] Date of Birth: 09/21/1960 Referring Provider (OT): Dr. Leeroy Cha   Encounter Date: 11/19/2020   OT End of Session - 11/19/20 0931     Visit Number 13    Number of Visits 24    Date for OT Re-Evaluation 12/30/20    Authorization Time Period Reporting period starting 10/08/2020    OT Start Time 0847    OT Stop Time 0917    OT Time Calculation (min) 30 min    Equipment Utilized During Treatment RW    Activity Tolerance Patient tolerated treatment well    Behavior During Therapy [facility] for tasks assessed/performed             Past Medical History:  Diagnosis Date   Medical history non-contributory     Past Surgical History:  Procedure Laterality Date   MANDIBLE FRACTURE SURGERY     NO PAST SURGERIES      There were no vitals filed for this visit.   Subjective Assessment - 11/19/20 0930     Subjective  "I drove here today."    Pertinent History cerebellar stroke    Limitations balance, R sided weakness and ataxia    Patient Stated Goals "I want to get back to normal."    Currently in Pain? No/denies    Pain Score 0-No pain    Multiple Pain Sites No            Occupational Therapy Treatment: Neuro re-ed: Participation in [facility] skills with tracing and marking targets with marker on whiteboard with fair accuracy.  Participated in ball skills including tossing, catching, shooting, dribbling basketball with RUE.  Frequent rest breaks needed and occasional need to incorporate L hand to control basketball.  More difficulty to control a high vs short dribble.  Encouraged pt practice ball skills at home to target [facility] skills with RUE.    Response to Treatment: See Plan/clinical impression below.     OT Education - 11/19/20 0931      Education Details HEP progression for [facility] skills    Person(s) Educated Patient    Methods Explanation;Demonstration;Verbal cues    Comprehension Verbalized understanding;Verbal cues required;Need further instruction;Returned demonstration              OT Short Term Goals - 11/08/20 1618       OT SHORT TERM GOAL #1   Title Pt will be indep with HEP for RUE strength/coordination    Baseline Eval: HEP initiated at eval, will continue to add on; indep with HEP    Time 6    Period Weeks    Status Achieved    Target Date 11/18/20               OT Long Term Goals - 11/08/20 1618       OT LONG TERM GOAL #1   Title Pt will increase FOTO score to 60 or better to indicate increased functional performance.    Baseline Eval: FOTO 52; 11/08/20: FOTO score 67    Time 12    Period Weeks    Status On-going    Target Date 12/30/20      OT LONG TERM GOAL #2   Title Pt will increase R grip strength to 80 lbs or better to improve carrying heavier ADL supplies with  R dominant hand.    Baseline Eval: R grip 65 lbs (L non-dominant 76 lbs); 11/08/20: R grip 79 lbs    Time 12    Period Weeks    Status Partially Met    Target Date 12/30/20      OT LONG TERM GOAL #3   Title Pt will improve ability to pick up and manipulate small ADL supplies without dropping with R dominant hand as demonstrated by improved 9 hole peg test score to <30 sec or better.    Baseline Eval: R 9 hole peg test 56.3 sec (L non-dominant 20.6 sec); 11/08/2020: 9 hole peg test 48 sec    Time 12    Period Weeks    Status Partially Met    Target Date 12/30/20      OT LONG TERM GOAL #4   Title Pt will improve R GMC as demonstrated by ability to brush teeth, shave, eat with R dominant hand.    Baseline Eval: Pt using L non-dominant hand to brush teeth, shave, eat d/t moderate ataxia throughout RUE; 11/08/20: Pt still brushing teeth with L hand, son shaves him, but pt can eat finger foods with R hand.  Pt can self feed  apple sauce with spoon in R hand with moderate ataxia, some spilling.    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    OT Occupational Profile and History Detailed Assessment- Review of Records and additional review of physical, cognitive, psychosocial history related to current functional performance    Occupational performance deficits (Please refer to evaluation for details): ADL's;IADL's;Leisure    Body Structure / Function / Physical Skills ADL;Dexterity;Strength;Balance;Coordination;FMC;IADL;Endurance;Sensation;UE functional use;GMC    Rehab Potential Excellent    Clinical Decision Making Several treatment options, min-mod task modification necessary    Comorbidities Affecting Occupational Performance: May have comorbidities impacting occupational performance    Modification or Assistance to Complete Evaluation  No modification of tasks or assist necessary to complete eval    OT Frequency 2x / week    OT Duration 12 weeks    OT Treatment/Interventions Self-care/ADL training;Therapeutic exercise;DME and/or AE instruction;Balance training;Neuromuscular education;Energy conservation;Therapeutic activities;Patient/family education;Coping strategies training    OT Home Exercise Plan theraputty increased to blue and instructed in exercises today    Consulted and Agree with Plan of Care Patient             Patient will benefit from skilled therapeutic intervention in order to improve the following deficits and impairments:   Body Structure / Function / Physical Skills: ADL, Dexterity, Strength, Balance, Coordination, FMC, IADL, Endurance, Sensation, UE functional use, GMC       Visit Diagnosis: Muscle weakness (generalized)  Other lack of  coordination  Cerebellar stroke [facility])    Problem List Patient Active Problem List   Diagnosis Date Noted   Vitamin D deficiency 10/04/2020   Mediastinal adenopathy 10/04/2020   Hyponatremia    Transaminitis    Diabetes mellitus, new onset ([place])    Malnutrition of moderate degree 09/13/2020   Stroke ([place]) 09/12/2020   Aortic thrombus ([facility])    Hyperlipidemia    Essential hypertension    Impaired fasting glucose  Cerebellar stroke ([place]) 09/11/2020   Leta Speller, MS, OTR/L  Darleene Cleaver, OT/L 11/19/2020, 9:43 AM  [place] MAIN [facility] SERVICES [address] [place], [place], [address] Phone: [phone]   Fax:  [phone]  Name: Malik Jones MRN: [id number] Date of Birth: [birth year]

## 2020-11-21 ENCOUNTER — Ambulatory Visit: Payer: BC Managed Care – PPO | Admitting: Physical Therapy

## 2020-11-21 ENCOUNTER — Ambulatory Visit: Payer: BC Managed Care – PPO

## 2020-11-21 ENCOUNTER — Other Ambulatory Visit: Payer: Self-pay

## 2020-11-21 DIAGNOSIS — I639 Cerebral infarction, unspecified: Secondary | ICD-10-CM | POA: Diagnosis not present

## 2020-11-21 DIAGNOSIS — M6281 Muscle weakness (generalized): Secondary | ICD-10-CM

## 2020-11-21 DIAGNOSIS — R2681 Unsteadiness on feet: Secondary | ICD-10-CM | POA: Diagnosis not present

## 2020-11-21 DIAGNOSIS — R269 Unspecified abnormalities of gait and mobility: Secondary | ICD-10-CM | POA: Diagnosis not present

## 2020-11-21 DIAGNOSIS — R2689 Other abnormalities of gait and mobility: Secondary | ICD-10-CM | POA: Diagnosis not present

## 2020-11-21 DIAGNOSIS — R262 Difficulty in walking, not elsewhere classified: Secondary | ICD-10-CM

## 2020-11-21 DIAGNOSIS — R278 Other lack of coordination: Secondary | ICD-10-CM

## 2020-11-21 NOTE — Therapy (Signed)
[place] MAIN [facility] SERVICES [address] [place], [place], [address] Phone: [phone]   Fax:  [phone]  Physical Therapy Treatment  Patient Details  Name: Malik Jones MRN: [id number] Date of Birth: 02-05-1961 No data recorded  Encounter Date: 11/21/2020   PT End of Session - 11/21/20 0818     Visit Number 14    Number of Visits 24    Date for PT Re-Evaluation 12/31/20    Authorization - Visit Number 12    Authorization - Number of Visits 24    Progress Note Due on Visit 20    PT Start Time 0809    PT Stop Time 0844    PT Time Calculation (min) 35 min    Equipment Utilized During Treatment Gait belt    Activity Tolerance Patient tolerated treatment well    Behavior During Therapy WFL for tasks assessed/performed             Past Medical History:  Diagnosis Date   Medical history non-contributory     Past Surgical History:  Procedure Laterality Date   MANDIBLE FRACTURE SURGERY     NO PAST SURGERIES      There were no vitals filed for this visit.   Subjective Assessment - 11/21/20 0812     Subjective Pt reports no changes since last session. reports driving his car has continued to go well.    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 Infarct occured on 09/11/20; no significant PMH. He attended inpatient rehab services at [facility] for 2 weeks and d/c from inpatient 10 days ago. He lived with his brother for 1 week before moving back into his own home. He is now living independently with abundant family check-ins. He is currently not driving or employed. He states his employer has had to post his job during this time. Pt reports 1 fall immediately after stroke, otherwise no history of falls. Only PT he has recieved has been post-infarct. Pt arrivied with RW  however states he rarely uses it and does nto use it in the home.    Limitations House hold activities;Walking;Lifting;Writing    How long can you sit comfortably? unaffected    How long can you stand comfortably? states standing is not a problem    How long can you walk comfortably? states walking is not a problem    Patient Stated Goals return to work and driving, be fully independent as he was before cerebellar infarct            Neuro Re- Ed:    Heel to toe rocks x 25 each - increased difficulty with heel rocks copmared to toe rocks -cues for keeping trunk upright as he tends to compensate with hip strategy     Tandem balance on airex 3 x 30 sec ea LE  -1st set semitandem -2nd and 3rd set normal tandem stance    Balance course with dual motor task of navigating obstacle course and dribbling basketball.  1 sets of 4 laps through obstacle course -pt able to recover balance well when thrown off by basketall -Pt able to alternate dribbling hands at times during session today with cueing. Still has increased difficulty with dribbling on the right side (difficulty with coordination of motor task)    Standing on Airex pad with basketball pass with author. Randomized chest and  boune passes and passed to challenge pt to reach outside BOS -good balance responses, no LOB noted    Tandem stance with R LE posterior       There Act:   Ambulation training: Walking for 5 min with [employer] stopping, turing, retro walking, sidestepping, and various turning transitions in order to challenge patient balance. Most difficulty transition was from forward to retro ambulaiton.    STS with jump: to improve muscular power, strength and transfers from lower surfaces  -X 20  -mod fatigue noted following          Pt educated throughout session about proper posture and technique with exercises. Improved exercise technique, movement at target joints, use of target muscles after min to mod  verbal, visual, tactile cues.                          PT Education - 11/21/20 0817     Education Details Excerice form and technique    Person(s) Educated Patient    Methods Explanation    Comprehension Verbalized understanding;Returned demonstration;Verbal cues required;Tactile cues required;Need further instruction              PT Short Term Goals - 11/08/20 4562       PT SHORT TERM GOAL #1   Title Patient will be independent in home exercise program to improve strength/mobility for better functional independence with ADLs.    Time 6    Period Weeks    Status Achieved    Target Date 11/19/20               PT Long Term Goals - 11/11/20 1003       PT LONG TERM GOAL #1   Title Patient will increase FOTO score to equal to or greater than 77 to demonstrate statistically significant improvement in mobility and quality of life.    Baseline 10/08/20: 63, 9/30: 67    Time 12    Period Weeks    Status On-going    Target Date 12/31/20      PT LONG TERM GOAL #2   Title Pt will improve BERG by at least 3 points in order to demonstrate clinically significant improvement in balance.    Baseline 42 10/11/20    Time 8    Period Weeks    Status Achieved      PT LONG TERM GOAL #3   Title Patient will reduce timed up and go to <11 seconds to reduce fall risk and demonstrate improved transfer/gait ability.    Baseline 10/08/20: 12.68 seconds;10.3 sec    Time 12    Period Weeks    Status Achieved      PT LONG TERM GOAL #4   Title Patient will increase six minute walk test distance to >1000 for progression to community ambulator and improve gait ability    Baseline 10/08/20: 962f    Time 12    Period Weeks    Status Partially Met    Target Date 12/31/20      PT LONG TERM GOAL #5   Title Patient will increase Functional Gait Assessment score to >23/30 as to reduce fall risk and improve dynamic gait safety with community ambulation.    Baseline 10/15/20:  14/30. 20/30 9/30, revised goal this session    Time 12    Period Weeks    Status Revised    Target Date 12/31/20      PT LONG TERM  GOAL #6   Title Pt will improve score on mini BEST test by 4 points or greater in order to indicate improved balance and decreased risk of falls.    Baseline to be assessed visit 11 10/3: 20/28    Time 7    Period Weeks    Status New    Target Date 12/27/20                   Plan - 11/21/20 0819     Clinical Impression Statement Patient continues to demonstrate good motivation during today's physical therapy session.  Patient demonstrated improved ability to perform complex motor tasks including dribbling basketball with bilateral upper extremities and navigating balance course with tight turns.  Today was first time patient was able to adequately perform dribbling with the involved upper extremity as compared to previous sessions there is only able to dribble with his left hand.  Patient continues benefit from skilled physical therapy services in order to improve his balance, improve his ambulatory capacity, decreased risk of falls and improve his overall quality of life.    Personal Factors and Comorbidities Age;Comorbidity 1;Comorbidity 2    Comorbidities HTN , DM    Examination-Activity Limitations Locomotion Level;Transfers;Stairs    Examination-Participation Restrictions Driving;Occupation;Community Activity;Yard Work    Stability/Clinical Decision Making Stable/Uncomplicated    Rehab Potential Excellent    PT Frequency 2x / week    PT Duration 12 weeks    PT Treatment/Interventions ADLs/Self Care Home Management;Aquatic Therapy;Biofeedback;Cryotherapy;Electrical Stimulation;Moist Heat;Traction;Ultrasound;Gait training;DME Instruction;Stair training;Functional mobility training;Therapeutic activities;Neuromuscular re-education;Balance training;Therapeutic exercise;Patient/family education;Manual techniques    PT Next Visit Plan begin dynamc  gait training activity, continue with LE strengthening program    PT Home Exercise Plan Provided Access Code: [email or web address]  URL: [email or web address]  Date: 10/11/2020  Prepared by: Rivka Barbara    Consulted and Agree with Plan of Care Patient             Patient will benefit from skilled therapeutic intervention in order to improve the following deficits and impairments:  Abnormal gait, Decreased activity tolerance, Decreased endurance, Impaired sensation, Improper body mechanics, Decreased balance, Decreased coordination, Decreased mobility, Decreased safety awareness, Difficulty walking  Visit Diagnosis: Abnormality of gait and mobility  Difficulty in walking, not elsewhere classified  Muscle weakness (generalized)  Other abnormalities of gait and mobility  Unsteadiness on feet     Problem List Patient Active Problem List   Diagnosis Date Noted   Vitamin D deficiency 10/04/2020   Mediastinal adenopathy 10/04/2020   Hyponatremia    Transaminitis    Diabetes mellitus, new onset ([place])    Malnutrition of moderate degree 09/13/2020   Stroke ([place]) 09/12/2020   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 [place], [place], [address] Phone: [phone]   Fax:  [phone]  Name: Yochanan Eddleman MRN: [id number] Date of Birth: 1960/09/22

## 2020-11-21 NOTE — Therapy (Signed)
[place] MAIN [facility] SERVICES [address] [place], [place], [address] Phone: [phone]   Fax:  [phone]  Occupational Therapy Treatment  Patient Details  Name: Malik Jones MRN: [id number] Date of Birth: 1960/07/20 Referring Provider (OT): Dr. Leeroy Cha   Encounter Date: 11/21/2020   OT End of Session - 11/21/20 1309     Visit Number 14    Number of Visits 24    Date for OT Re-Evaluation 12/30/20    Authorization Time Period Reporting period starting 10/08/2020    OT Start Time 0845    OT Stop Time 0930    OT Time Calculation (min) 45 min    Activity Tolerance Patient tolerated treatment well    Behavior During Therapy [facility] for tasks assessed/performed             Past Medical History:  Diagnosis Date   Medical history non-contributory     Past Surgical History:  Procedure Laterality Date   MANDIBLE FRACTURE SURGERY     NO PAST SURGERIES      There were no vitals filed for this visit.   Subjective Assessment - 11/21/20 1308     Subjective  Pt reports doing well with no new concerns.    Pertinent History cerebellar stroke    Limitations balance, R sided weakness and ataxia    Patient Stated Goals "I want to get back to normal."    Currently in Pain? No/denies    Pain Score 0-No pain    Multiple Pain Sites No            Occupational Therapy Treatment: Neuro re-ed: Pt completed FM task of picking up small push pins and pushing pins into vertical board resting on table top, addressing [place] by repeated trials of pinning at the center of a target on cork board.  Occasional dropping of pins d/t weak pinch when pushing into cork board, fair + accuracy to hit center target.  Placed jumbo pegs vertically on table top and pt worked to remove small stones within the group of pegs without knocking pegs over, placed stones atop pegs and worked to remove stones without tipping over pegs.  Pt able to complete with ~ 75%  accuracy using R hand.  Participated in ball skills including tossing, catching, shooting, dribbling, balancing basketball in hand with RUE.  Frequent rest breaks needed and occasional need to incorporate L hand to control basketball.    Response to Treatment: Pt struggles to balance ball in R hand, lacking distal control with outstretched arm.  Improving control with dribbling ball with R hand and between R/L hands.  Difficulty with simultaneous pinching and pushing of an object (small pin) when trying to push the pin into cork board, pin occasionally dropped from hand.  Pt will benefit from continued OT to improve RUE coordination for better control and manipulation of ADL supplies in dominant hand.      OT Education - 11/21/20 1309     Education Details HEP progression for [facility] skills    Person(s) Educated Patient    Methods Explanation;Demonstration;Verbal cues    Comprehension Verbalized understanding;Verbal cues required;Need further instruction;Returned demonstration              OT Short Term Goals - 11/08/20 1618       OT SHORT TERM GOAL #1   Title Pt will be indep with HEP for RUE strength/coordination    Baseline Eval: HEP initiated at eval, will continue to add on;  indep with HEP    Time 6    Period Weeks    Status Achieved    Target Date 11/18/20               OT Long Term Goals - 11/08/20 1618       OT LONG TERM GOAL #1   Title Pt will increase FOTO score to 60 or better to indicate increased functional performance.    Baseline Eval: FOTO 52; 11/08/20: FOTO score 67    Time 12    Period Weeks    Status On-going    Target Date 12/30/20      OT LONG TERM GOAL #2   Title Pt will increase R grip strength to 80 lbs or better to improve carrying heavier ADL supplies with R dominant hand.    Baseline Eval: R grip 65 lbs (L non-dominant 76 lbs); 11/08/20: R grip 79 lbs    Time 12    Period Weeks    Status Partially Met    Target Date 12/30/20      OT LONG  TERM GOAL #3   Title Pt will improve ability to pick up and manipulate small ADL supplies without dropping with R dominant hand as demonstrated by improved 9 hole peg test score to <30 sec or better.    Baseline Eval: R 9 hole peg test 56.3 sec (L non-dominant 20.6 sec); 11/08/2020: 9 hole peg test 48 sec    Time 12    Period Weeks    Status Partially Met    Target Date 12/30/20      OT LONG TERM GOAL #4   Title Pt will improve R GMC as demonstrated by ability to brush teeth, shave, eat with R dominant hand.    Baseline Eval: Pt using L non-dominant hand to brush teeth, shave, eat d/t moderate ataxia throughout RUE; 11/08/20: Pt still brushing teeth with L hand, son shaves him, but pt can eat finger foods with R hand.  Pt can self feed apple sauce with spoon in R hand with moderate ataxia, some spilling.    Time 12    Period Weeks    Status On-going    Target Date 12/30/20             Plan - 11/21/20 1322     Clinical Impression Statement Pt struggles to balance ball in R hand, lacking distal control with outstretched arm.  Improving control with dribbling ball with R hand and between R/L hands.  Difficulty with simultaneous pinching and pushing of an object (small pin) when trying to push the pin into cork board, pin occasionally dropped from hand.  Pt will benefit from continued OT to improve RUE coordination for better control and manipulation of ADL supplies in dominant hand.    OT Occupational Profile and History Detailed Assessment- Review of Records and additional review of physical, cognitive, psychosocial history related to current functional performance    Occupational performance deficits (Please refer to evaluation for details): ADL's;IADL's;Leisure    Body Structure / Function / Physical Skills ADL;Dexterity;Strength;Balance;Coordination;FMC;IADL;Endurance;Sensation;UE functional use;GMC    Rehab Potential Excellent    Clinical Decision Making Several treatment options, min-mod  task modification necessary    Comorbidities Affecting Occupational Performance: May have comorbidities impacting occupational performance    Modification or Assistance to Complete Evaluation  No modification of tasks or assist necessary to complete eval    OT Frequency 2x / week    OT Duration 12 weeks  OT Treatment/Interventions Self-care/ADL training;Therapeutic exercise;DME and/or AE instruction;Balance training;Neuromuscular education;Energy conservation;Therapeutic activities;Patient/family education;Coping strategies training    OT Home Exercise Plan theraputty increased to blue and instructed in exercises today    Consulted and Agree with Plan of Care Patient             Patient will benefit from skilled therapeutic intervention in order to improve the following deficits and impairments:   Body Structure / Function / Physical Skills: ADL, Dexterity, Strength, Balance, Coordination, FMC, IADL, Endurance, Sensation, UE functional use, GMC       Visit Diagnosis: Muscle weakness (generalized)  Other lack of coordination    Problem List Patient Active Problem List   Diagnosis Date Noted   Vitamin D deficiency 10/04/2020   Mediastinal adenopathy 10/04/2020   Hyponatremia    Transaminitis    Diabetes mellitus, new onset ([place])    Malnutrition of moderate degree 09/13/2020   Stroke ([place]) 09/12/2020   Aortic thrombus ([facility])    Hyperlipidemia    Essential hypertension    Impaired fasting glucose    Cerebellar stroke ([place]) 09/11/2020   Leta Speller, MS, OTR/L  Darleene Cleaver, OT/L 11/21/2020, 1:22 PM  [place] [address] [place], [place], [address] Phone: [phone]   Fax:  [phone]  Name: Malik Jones MRN: [id number] Date of Birth: 04/17/1960

## 2020-11-26 ENCOUNTER — Encounter: Payer: Self-pay | Admitting: Physical Therapy

## 2020-11-26 ENCOUNTER — Ambulatory Visit: Payer: BC Managed Care – PPO | Admitting: Physical Therapy

## 2020-11-26 ENCOUNTER — Ambulatory Visit: Payer: BC Managed Care – PPO

## 2020-11-26 ENCOUNTER — Other Ambulatory Visit: Payer: Self-pay

## 2020-11-26 DIAGNOSIS — R2681 Unsteadiness on feet: Secondary | ICD-10-CM

## 2020-11-26 DIAGNOSIS — M6281 Muscle weakness (generalized): Secondary | ICD-10-CM | POA: Diagnosis not present

## 2020-11-26 DIAGNOSIS — R278 Other lack of coordination: Secondary | ICD-10-CM | POA: Diagnosis not present

## 2020-11-26 DIAGNOSIS — R262 Difficulty in walking, not elsewhere classified: Secondary | ICD-10-CM | POA: Diagnosis not present

## 2020-11-26 DIAGNOSIS — I639 Cerebral infarction, unspecified: Secondary | ICD-10-CM | POA: Diagnosis not present

## 2020-11-26 DIAGNOSIS — R269 Unspecified abnormalities of gait and mobility: Secondary | ICD-10-CM | POA: Diagnosis not present

## 2020-11-26 DIAGNOSIS — R2689 Other abnormalities of gait and mobility: Secondary | ICD-10-CM

## 2020-11-26 NOTE — Therapy (Signed)
[place] MAIN [facility] SERVICES [address] [place], [place], [address] Phone: [phone]   Fax:  [phone]  Physical Therapy Treatment  Patient Details  Name: Malik Jones MRN: [id number] Date of Birth: [birth year] No data recorded  Encounter Date: 11/26/2020   PT End of Session - 11/26/20 0815     Visit Number 15    Number of Visits 24    Date for PT Re-Evaluation 12/31/20    Authorization - Visit Number 14    Authorization - Number of Visits 24    Progress Note Due on Visit 20    PT Start Time 0805    PT Stop Time 0844    PT Time Calculation (min) 39 min    Equipment Utilized During Treatment Gait belt    Activity Tolerance Patient tolerated treatment well    Behavior During Therapy WFL for tasks assessed/performed             Past Medical History:  Diagnosis Date   Medical history non-contributory     Past Surgical History:  Procedure Laterality Date   MANDIBLE FRACTURE SURGERY     NO PAST SURGERIES      There were no vitals filed for this visit.   Subjective Assessment - 11/26/20 0807     Subjective Pt reports no changes since last session. Reports he has been completing his exercises regularly and has continued to improve.    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 Infarct occured on 09/11/20; no significant PMH. He attended inpatient rehab services at [facility] for 2 weeks and d/c from inpatient 10 days ago. He lived with his brother for 1 week before moving back into his own home. He is now living independently with abundant family check-ins. He is currently not driving or employed. He states his employer has had to post his job during this time. Pt reports 1 fall immediately after stroke, otherwise no history of falls. Only PT he has recieved has been  post-infarct. Pt arrivied with RW however states he rarely uses it and does nto use it in the home.    Limitations House hold activities;Walking;Lifting;Writing    How long can you sit comfortably? unaffected    How long can you stand comfortably? states standing is not a problem    How long can you walk comfortably? states walking is not a problem    Patient Stated Goals return to work and driving, be fully independent as he was before cerebellar infarct    Currently in Pain? No/denies             Treatment provided this session  Therex:          Neuro Re- Ed:   Heel to to rocks to improve pt a/p limits of stability  -2 x 15   Stance on airex pad  -1 x 10 step taps to 6 inch step -Added hedgehogs of 3 colors to 6 inch step  -pt challenged to touch hedgehog with coorespinding foot and color on command from PT to incorporate improved coordination and LE control.   -x25 on ea LE   Obstacle course with airex pad, weaving between 7 cones spread approx 2 feet apart and increases in speed and turn around at end of course (5 feet of increased speed) all while dribling basketball to incorporate dual motor task.  2 laps x 2 reps      There Act:  Ambulation training to improve community ambulation with complex tasks -forward and retro walking on command, transitions from each while continuing with forward motion.  -sidestepping on line to provide external cue for keeping strainght line (pt tends to step slightly posterior without cue when sidestpping to right -tandem walking on line -most difficult was tandem and sidestepping on line   TRX squat with Jump  -2 x 10 -cues for eccentric control following jump -to improve muscular power and high level transfers.   Cable walking 12.5# -sidestepping 1 lap in each direction -cues for slow eccentric control, no LOB noted, increased difficulty with sidestepping left.         Pt educated throughout session about proper  posture and technique with exercises. Improved exercise technique, movement at target joints, use of target muscles after min to mod verbal, visual, tactile cues.                            PT Education - 11/26/20 0809     Education Details Exercise technique and form    Person(s) Educated Patient    Methods Explanation    Comprehension Verbalized understanding              PT Short Term Goals - 11/08/20 0808       PT SHORT TERM GOAL #1   Title Patient will be independent in home exercise program to improve strength/mobility for better functional independence with ADLs.    Time 6    Period Weeks    Status Achieved    Target Date 11/19/20               PT Long Term Goals - 11/11/20 1003       PT LONG TERM GOAL #1   Title Patient will increase FOTO score to equal to or greater than 77 to demonstrate statistically significant improvement in mobility and quality of life.    Baseline 10/08/20: 63, 9/30: 67    Time 12    Period Weeks    Status On-going    Target Date 12/31/20      PT LONG TERM GOAL #2   Title Pt will improve BERG by at least 3 points in order to demonstrate clinically significant improvement in balance.    Baseline 42 10/11/20    Time 8    Period Weeks    Status Achieved      PT LONG TERM GOAL #3   Title Patient will reduce timed up and go to <11 seconds to reduce fall risk and demonstrate improved transfer/gait ability.    Baseline 10/08/20: 12.68 seconds;10.3 sec    Time 12    Period Weeks    Status Achieved      PT LONG TERM GOAL #4   Title Patient will increase six minute walk test distance to >1000 for progression to community ambulator and improve gait ability    Baseline 10/08/20: 921f    Time 12    Period Weeks    Status Partially Met    Target Date 12/31/20      PT LONG TERM GOAL #5   Title Patient will increase Functional Gait Assessment score to >23/30 as to reduce fall risk and improve dynamic gait safety  with community ambulation.    Baseline 10/15/20: 14/30. 20/30 9/30, revised goal this session    Time 12  Period Weeks    Status Revised    Target Date 12/31/20      PT LONG TERM GOAL #6   Title Pt will improve score on mini BEST test by 4 points or greater in order to indicate improved balance and decreased risk of falls.    Baseline to be assessed visit 11 10/3: 20/28    Time 7    Period Weeks    Status New    Target Date 12/27/20                   Plan - 11/26/20 0816     Clinical Impression Statement Patient continues to demonstrate good motivation and good tolerance for therapeutic activities and neuromuscular reeducation training.  Patient demonstrated improved ability to perform dynamic standing balance activities today as demonstrated by improved efficacy with dribbling basketball with various turns and difficulties.  Patient does continue to have significant difficulty dribbling with his right hand but dribbling with his left hand has become significantly easier.  Patient did knock over 2 cones out of his 8 times passing through the cones indicating continued difficulty with lower extremity coordination.  Patient will continue to benefit from skilled physical therapy intervention in order to improve his lower extremity strength, balance, and return to his prior level of function.    Personal Factors and Comorbidities Age;Comorbidity 1;Comorbidity 2    Comorbidities HTN , DM    Examination-Activity Limitations Locomotion Level;Transfers;Stairs    Examination-Participation Restrictions Driving;Occupation;Community Activity;Yard Work    Stability/Clinical Decision Making Stable/Uncomplicated    Rehab Potential Excellent    PT Frequency 2x / week    PT Duration 12 weeks    PT Treatment/Interventions ADLs/Self Care Home Management;Aquatic Therapy;Biofeedback;Cryotherapy;Electrical Stimulation;Moist Heat;Traction;Ultrasound;Gait training;DME Instruction;Stair  training;Functional mobility training;Therapeutic activities;Neuromuscular re-education;Balance training;Therapeutic exercise;Patient/family education;Manual techniques    PT Next Visit Plan begin dynamc gait training activity, continue with LE strengthening program    PT Home Exercise Plan Provided Access Code: [email or web address]  URL: [email or web address]  Date: 10/11/2020  Prepared by: Rivka Barbara    Consulted and Agree with Plan of Care Patient             Patient will benefit from skilled therapeutic intervention in order to improve the following deficits and impairments:  Abnormal gait, Decreased activity tolerance, Decreased endurance, Impaired sensation, Improper body mechanics, Decreased balance, Decreased coordination, Decreased mobility, Decreased safety awareness, Difficulty walking  Visit Diagnosis: Muscle weakness (generalized)  Abnormality of gait and mobility  Unsteadiness on feet  Other abnormalities of gait and mobility     Problem List Patient Active Problem List   Diagnosis Date Noted   Vitamin D deficiency 10/04/2020   Mediastinal adenopathy 10/04/2020   Hyponatremia    Transaminitis    Diabetes mellitus, new onset ([place])    Malnutrition of moderate degree 09/13/2020   Stroke ([place]) 09/12/2020   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

## 2020-11-26 NOTE — Therapy (Signed)
[place] MAIN [facility] SERVICES [address] [place], [place], [address] Phone: [phone]   Fax:  [phone]  Occupational Therapy Treatment  Patient Details  Name: Malik Jones MRN: [id number] Date of Birth: October 25, 1960 Referring Provider (OT): Dr. Leeroy Cha   Encounter Date: 11/26/2020   OT End of Session - 11/26/20 1643     Visit Number 15    Number of Visits 24    Date for OT Re-Evaluation 12/30/20    Authorization Time Period Reporting period starting 10/08/2020    OT Start Time 0845    OT Stop Time 0930    OT Time Calculation (min) 45 min    Activity Tolerance Patient tolerated treatment well    Behavior During Therapy [facility] for tasks assessed/performed             Past Medical History:  Diagnosis Date   Medical history non-contributory     Past Surgical History:  Procedure Laterality Date   MANDIBLE FRACTURE SURGERY     NO PAST SURGERIES      There were no vitals filed for this visit.   Subjective Assessment - 11/26/20 1640     Subjective  Pt reports he's started to move the steering wheel more with his R arm while driving.  Had been relying on his left arm or both arms.    Pertinent History cerebellar stroke    Limitations balance, R sided weakness and ataxia    Patient Stated Goals "I want to get back to normal."    Currently in Pain? No/denies    Pain Score 0-No pain    Multiple Pain Sites No            Occupational Therapy Treatment: Neuro re-ed: Participation in placing small push pins on vertically placed cork board at table top level using RUE, working towards directing pins at a target.  Pt presents with mild ataxia when nearing hand to board, occasional dropping of pins.  Practiced [place] dribbling basketball between L/R hands, dribbling with R hand only, bouncing back and forth to OT with RUE only, balancing ball in hand with outstretched arm (moderate ataxia and typically stabilizes ball with  opposite arm), but control with dribbling is improving.   Self Care: Used R hand to fill small paper cup of water 1/3 full, transferred cup back and forth from water station to table top ~20 ft x3 trials using R hand only.  2nd transport round, pt set cup on table and tipped cup over with R hand, spilling water.  Provided encouragement to keep slow pace and fill water partially full.  Practiced hand to mouth sipping with R hand, no spilling when maintaining slower movement patterns.    Encouraged pt practice at home with light weight cups.   Response to Treatment: See Plan/clinical impression below.    OT Education - 11/26/20 1641     Education Details strategies for engaging RUE into self feeding    Person(s) Educated Patient    Methods Explanation;Demonstration;Verbal cues    Comprehension Verbalized understanding;Verbal cues required;Need further instruction;Returned demonstration              OT Short Term Goals - 11/08/20 1618       OT SHORT TERM GOAL #1   Title Pt will be indep with HEP for RUE strength/coordination    Baseline Eval: HEP initiated at eval, will continue to add on; indep with HEP    Time 6    Period Weeks  Status Achieved    Target Date 11/18/20               OT Long Term Goals - 11/08/20 1618       OT LONG TERM GOAL #1   Title Pt will increase FOTO score to 60 or better to indicate increased functional performance.    Baseline Eval: FOTO 52; 11/08/20: FOTO score 67    Time 12    Period Weeks    Status On-going    Target Date 12/30/20      OT LONG TERM GOAL #2   Title Pt will increase R grip strength to 80 lbs or better to improve carrying heavier ADL supplies with R dominant hand.    Baseline Eval: R grip 65 lbs (L non-dominant 76 lbs); 11/08/20: R grip 79 lbs    Time 12    Period Weeks    Status Partially Met    Target Date 12/30/20      OT LONG TERM GOAL #3   Title Pt will improve ability to pick up and manipulate small ADL supplies  without dropping with R dominant hand as demonstrated by improved 9 hole peg test score to <30 sec or better.    Baseline Eval: R 9 hole peg test 56.3 sec (L non-dominant 20.6 sec); 11/08/2020: 9 hole peg test 48 sec    Time 12    Period Weeks    Status Partially Met    Target Date 12/30/20      OT LONG TERM GOAL #4   Title Pt will improve R GMC as demonstrated by ability to brush teeth, shave, eat with R dominant hand.    Baseline Eval: Pt using L non-dominant hand to brush teeth, shave, eat d/t moderate ataxia throughout RUE; 11/08/20: Pt still brushing teeth with L hand, son shaves him, but pt can eat finger foods with R hand.  Pt can self feed apple sauce with spoon in R hand with moderate ataxia, some spilling.    Time 12    Period Weeks    Status On-going    Target Date 12/30/20              Plan - 11/26/20 1659     Clinical Impression Statement Pt showing improved control with dribbling ball with R hand and between R/L hands.  Pt struggles to maintain distal control with an outstretched arm when holding a ball.  Pt is improving with R hand with item transport tasks.  Able to successfully carry small paper cup 1/3 filled with water using R hand x20 feet 2 of 3 attempts without spilling.  Will continue to progress RUE GMC/FMC in order to maximize indep with ADL/IADL tasks.    OT Occupational Profile and History Detailed Assessment- Review of Records and additional review of physical, cognitive, psychosocial history related to current functional performance    Occupational performance deficits (Please refer to evaluation for details): ADL's;IADL's;Leisure    Body Structure / Function / Physical Skills ADL;Dexterity;Strength;Balance;Coordination;FMC;IADL;Endurance;Sensation;UE functional use;GMC    Rehab Potential Excellent    Clinical Decision Making Several treatment options, min-mod task modification necessary    Comorbidities Affecting Occupational Performance: May have  comorbidities impacting occupational performance    Modification or Assistance to Complete Evaluation  No modification of tasks or assist necessary to complete eval    OT Frequency 2x / week    OT Duration 12 weeks    OT Treatment/Interventions Self-care/ADL training;Therapeutic exercise;DME and/or AE instruction;Balance training;Neuromuscular education;Energy conservation;Therapeutic activities;Patient/family  education;Coping strategies training    OT Home Exercise Plan theraputty increased to blue and instructed in exercises today    Consulted and Agree with Plan of Care Patient             Patient will benefit from skilled therapeutic intervention in order to improve the following deficits and impairments:   Body Structure / Function / Physical Skills: ADL, Dexterity, Strength, Balance, Coordination, FMC, IADL, Endurance, Sensation, UE functional use, GMC       Visit Diagnosis: Muscle weakness (generalized)  Other lack of coordination  Cerebellar stroke [facility])    Problem List Patient Active Problem List   Diagnosis Date Noted   Vitamin D deficiency 10/04/2020   Mediastinal adenopathy 10/04/2020   Hyponatremia    Transaminitis    Diabetes mellitus, new onset ([place])    Malnutrition of moderate degree 09/13/2020   Stroke ([place]) 09/12/2020   Aortic thrombus ([facility])    Hyperlipidemia    Essential hypertension    Impaired fasting glucose    Cerebellar stroke ([place]) 09/11/2020   Leta Speller, MS, OTR/L  Darleene Cleaver, OT/L 11/26/2020, 5:00 PM  [place] [address] [place], [place], [address] Phone: [phone]   Fax:  [phone]  Name: Malik Jones MRN: [id number] Date of Birth: 09-Feb-1961

## 2020-11-27 ENCOUNTER — Encounter: Payer: Self-pay | Admitting: Internal Medicine

## 2020-11-27 ENCOUNTER — Ambulatory Visit (INDEPENDENT_AMBULATORY_CARE_PROVIDER_SITE_OTHER): Payer: BC Managed Care – PPO | Admitting: Internal Medicine

## 2020-11-27 VITALS — BP 130/87 | HR 82 | Temp 98.2°F | Ht 64.88 in | Wt 150.2 lb

## 2020-11-27 DIAGNOSIS — I639 Cerebral infarction, unspecified: Secondary | ICD-10-CM | POA: Diagnosis not present

## 2020-11-27 DIAGNOSIS — E1149 Type 2 diabetes mellitus with other diabetic neurological complication: Secondary | ICD-10-CM

## 2020-11-27 DIAGNOSIS — I10 Essential (primary) hypertension: Secondary | ICD-10-CM

## 2020-11-27 DIAGNOSIS — E782 Mixed hyperlipidemia: Secondary | ICD-10-CM | POA: Diagnosis not present

## 2020-11-27 HISTORY — DX: Essential (primary) hypertension: I10

## 2020-11-27 LAB — URINALYSIS, ROUTINE W REFLEX MICROSCOPIC
Bilirubin, UA: NEGATIVE
Glucose, UA: NEGATIVE
Ketones, UA: NEGATIVE
Leukocytes,UA: NEGATIVE
Nitrite, UA: NEGATIVE
Protein,UA: NEGATIVE
RBC, UA: NEGATIVE
Specific Gravity, UA: 1.015 (ref 1.005–1.030)
Urobilinogen, Ur: 0.2 mg/dL (ref 0.2–1.0)
pH, UA: 6.5 (ref 5.0–7.5)

## 2020-11-27 LAB — BAYER DCA HB A1C WAIVED: HB A1C (BAYER DCA - WAIVED): 6.4 % — ABNORMAL HIGH (ref 4.8–5.6)

## 2020-11-27 MED ORDER — METFORMIN HCL 500 MG PO TABS: 500.0000 mg | ORAL_TABLET | Freq: Every day | ORAL | 3 refills | Status: DC

## 2020-11-27 NOTE — Progress Notes (Signed)
BP 130/87   Pulse 82   Temp 98.2 F (36.8 C) (Oral)   Ht 5' 4.88" (1.648 m)   Wt 150 lb 3.2 oz (68.1 kg)   SpO2 97%   BMI 25.09 kg/m    Subjective:    Patient ID: Malik Jones, male    DOB: 03-20-1960, 60 y.o.   MRN: [id number]  Chief Complaint  Patient presents with   New Patient (Initial Visit)    HPI: Malik Jones is a 60 y.o. male  Pt is here to establish care , had a recent Stroke x 2 months - Rt sided palsy worse on the right side, has been doing PT at [facility]. Sees neurology, is on elquis for such   Pt was admitted on 8/3  @ [facility] and then transferred to [place] [facility] [place] for an acute cerebellar infarct .  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  Per vascular surgery recommend transfer to [facility] where the CT surgery is available. Was placed on a  heparin drip.).  He did not need surgical intervention as thrombus had resolved on repeat CT head  8/10 until 8/18  CT of head showed acute/subacute nonhemorrhagic right superior cerebellar infarct.   Diabetes He presents for his follow-up diabetic visit. He has type 2 diabetes mellitus. Pertinent negatives for hypoglycemia include no confusion, headaches or seizures. Associated symptoms include weakness. Pertinent negatives for diabetes include no blurred vision, no chest pain, no fatigue, no foot paresthesias, no foot ulcerations, no polydipsia, no polyphagia, no polyuria, no visual change and no weight loss.  Neurologic Problem The patient's primary symptoms include focal weakness, a loss of balance, slurred speech and weakness. The patient's pertinent negatives include no altered mental status, clumsiness, focal sensory loss, memory loss, near-syncope or visual change. This is a new problem. The current episode started more than 1 month ago. Pertinent negatives include no back pain, bladder incontinence, chest pain, confusion, fatigue, fever, headaches or  vomiting.  Chief Complaint  Patient presents with   New Patient (Initial Visit)    Relevant past medical, surgical, family and social history reviewed and updated as indicated. Interim medical history since our last visit reviewed. Allergies and medications reviewed and updated.  Review of Systems  Constitutional:  Negative for fatigue, fever and weight loss.  Eyes:  Negative for blurred vision.  Cardiovascular:  Negative for chest pain and near-syncope.  Gastrointestinal:  Negative for vomiting.  Endocrine: Negative for polydipsia, polyphagia and polyuria.  Genitourinary:  Negative for bladder incontinence.  Musculoskeletal:  Negative for back pain.  Neurological:  Positive for focal weakness, weakness and loss of balance. Negative for seizures and headaches.  Psychiatric/Behavioral:  Negative for confusion and memory loss.    Per HPI unless specifically indicated above     Objective:    BP 130/87   Pulse 82   Temp 98.2 F (36.8 C) (Oral)   Ht 5' 4.88" (1.648 m)   Wt 150 lb 3.2 oz (68.1 kg)   SpO2 97%   BMI 25.09 kg/m   Wt Readings from Last 3 Encounters:  11/27/20 150 lb 3.2 oz (68.1 kg)  11/12/20 148 lb 9.6 oz (67.4 kg)  11/07/20 145 lb (65.8 kg)    Physical Exam Vitals and nursing note reviewed.  Constitutional:      General: He is not in acute distress.    Appearance: Normal appearance. He is not ill-appearing or diaphoretic.  HENT:  Head: Normocephalic and atraumatic.     Right Ear: Tympanic membrane and external ear normal. There is no impacted cerumen.     Left Ear: External ear normal.     Nose: No congestion or rhinorrhea.     Mouth/Throat:     Pharynx: No oropharyngeal exudate or posterior oropharyngeal erythema.  Eyes:     Conjunctiva/sclera: Conjunctivae normal.     Pupils: Pupils are equal, round, and reactive to light.  Cardiovascular:     Rate and Rhythm: Regular rhythm.     Heart sounds: No murmur heard.   No friction rub. No gallop.   Pulmonary:     Effort: No respiratory distress.     Breath sounds: No stridor. No wheezing or rhonchi.  Chest:     Chest wall: No tenderness.  Abdominal:     General: Abdomen is flat. Bowel sounds are normal.     Palpations: Abdomen is soft. There is no mass.     Tenderness: There is no abdominal tenderness.  Musculoskeletal:     Cervical back: Normal range of motion and neck supple. No tenderness. Neck rigidity: *.    Left lower leg: No edema.  Skin:    General: Skin is warm and dry.  Neurological:     Mental Status: He is alert.          Current Outpatient Medications:    amLODipine (NORVASC) 10 MG tablet, Take 1 tablet (10 mg total) by mouth daily., Disp: 30 tablet, Rfl: 1   apixaban (ELIQUIS) 5 MG TABS tablet, Take 1 tablet (5 mg total) by mouth 2 (two) times daily., Disp: 60 tablet, Rfl: 0   atorvastatin (LIPITOR) 40 MG tablet, Take 1 tablet (40 mg total) by mouth daily., Disp: 30 tablet, Rfl: 1   hydrochlorothiazide (MICROZIDE) 12.5 MG capsule, Take 3 capsules (37.5 mg total) by mouth daily., Disp: 90 capsule, Rfl: 1   metFORMIN (GLUCOPHAGE) 500 MG tablet, Take 1 tablet (500 mg total) by mouth daily with breakfast., Disp: 30 tablet, Rfl: 3    Assessment & Plan:  CVA:  To fu with neurology his sister coordinated everything for pt.  Is on eliquis for such  To fu with neurology asap Fu and mx per neuro Pt is receieving PT / OT for his CVA will need to continue such   2. DM A1c 6.5 at discharge, will recheck  check HbA1c,  urine  microalbumin  diabetic diet plan given to pt  adviced regarding hypoglycemia and instructions given to pt today on how to prevent and treat the same if it were to occur. pt acknowledges the plan and voices understanding of the same.  exercise plan given and encouraged.   advice diabetic yearly podiatry, ophthalmology , nutritionist , dental check q 6 months,  3. HTN : is on norvasc 10 mg daily and hctz 12.5 mg daily  Continue current meds.   Medication compliance emphasised. pt advised to keep Bp logs. Pt verbalised understanding of the same. Pt to have a low salt diet . Exercise to reach a goal of at least 150 mins a week.  lifestyle modifications explained and pt understands importance of the above.  4. HLD: continue lipitor for such  work on diet, SE of meds explained to pt. low fat and high fiber diet explained to pt.  Problem List Items Addressed This Visit       Cardiovascular and Mediastinum   Stroke [facility]) - Primary   Relevant Orders   Bayer DCA Hb  A1c Waived (Completed)   Lipid panel   Vitamin B12   Urinalysis, Routine w reflex microscopic (Completed)   CBC with Differential/Platelet   Comprehensive metabolic panel   Primary hypertension     Endocrine   Type 2 diabetes mellitus with neurological complications (HCC)   Relevant Medications   metFORMIN (GLUCOPHAGE) 500 MG tablet   Other Relevant Orders   Bayer DCA Hb A1c Waived (Completed)   Lipid panel   Vitamin B12   Urinalysis, Routine w reflex microscopic (Completed)   CBC with Differential/Platelet   Comprehensive metabolic panel     Other   Mixed hyperlipidemia     Orders Placed This Encounter  Procedures   Bayer DCA Hb A1c Waived   Lipid panel   Vitamin B12   Urinalysis, Routine w reflex microscopic   CBC with Differential/Platelet   Comprehensive metabolic panel     Meds ordered this encounter  Medications   metFORMIN (GLUCOPHAGE) 500 MG tablet    Sig: Take 1 tablet (500 mg total) by mouth daily with breakfast.    Dispense:  30 tablet    Refill:  3      Follow up plan: Return in about 4 weeks (around 12/25/2020).

## 2020-11-27 NOTE — Patient Instructions (Signed)
Hyperglycemia ?Hyperglycemia is when the sugar (glucose) level in your blood is too high. High blood sugar can happen to people who have or do not have diabetes. High blood sugar can happen quickly. It can be an emergency. ?What are the causes? ?If you have diabetes, high blood sugar may be caused by: ?Medicines that increase blood sugar or affect your control of diabetes. ?Getting less physical activity. ?Overeating. ?Being sick or injured or having an infection. ?Having surgery. ?Stress. ?Not giving yourself enough insulin (if you are taking it). ?You may have high blood sugar because you have diabetes that has not been diagnosed yet. ?If you do not have diabetes, high blood sugar may be caused by: ?Certain medicines. ?Stress. ?A bad illness. ?An infection. ?Having surgery. ?Diseases of the pancreas. ?What increases the risk? ?This condition is more likely to develop in people who have risk factors for diabetes, such as: ?Having a family member with diabetes. ?Certain conditions in which the body's defense system (immune system) attacks itself. These are called autoimmune disorders. ?Being overweight. ?Not being active. ?Having a condition called insulin resistance. ?Having a history of: ?Prediabetes. ?Diabetes when pregnant. ?Polycystic ovarian syndrome (PCOS). ?What are the signs or symptoms? ?This condition may not cause symptoms. If you do have symptoms, they may include: ?Feeling more thirsty than normal. ?Needing to pee (urinate) more often than normal. ?Hunger. ?Feeling very tired. ?Blurry eyesight (vision). ?You may get other symptoms as the condition gets worse, such as: ?Dry mouth. ?Pain in your belly (abdomen). ?Not being hungry (loss of appetite). ?Breath that smells fruity. ?Weakness. ?Weight loss that is not planned. ?A tingling or numb feeling in your hands or feet. ?A headache. ?Cuts or bruises that heal slowly. ?How is this treated? ?Treatment depends on the cause of your condition. Treatment may  include: ?Taking medicine to control your blood sugar levels. ?Changing your medicine or dosage if you take insulin or other diabetes medicines. ?Lifestyle changes. These may include: ?Exercising more. ?Eating healthier foods. ?Losing weight. ?Treating an illness or infection. ?Checking your blood sugar more often. ?Stopping or reducing steroid medicines. ?If your condition gets very bad, you will need to be treated in the hospital. ?Follow these instructions at home: ?General instructions ?Take over-the-counter and prescription medicines only as told by your doctor. ?Do not smoke or use any products that contain nicotine or tobacco. If you need help quitting, ask your doctor. ?If you drink alcohol: ?Limit how much you have to: ?0-1 drink a day for women who are not pregnant. ?0-2 drinks a day for men. ?Know how much alcohol is in a drink. In the [place] one drink equals one 12 oz bottle of beer (355 mL), one 5 oz glass of wine (148 mL), or one 1? oz glass of hard liquor (44 mL). ?Manage stress. If you need help with this, ask your doctor. ?Do exercises as told by your doctor. ?Keep all follow-up visits. ?Eating and drinking ? ?Stay at a healthy weight. ?Make sure you drink enough fluid when you: ?Exercise. ?Get sick. ?Are in hot temperatures. ?Drink enough fluid to keep your pee (urine) pale yellow. ?If you have diabetes: ? ?Know the symptoms of high blood sugar. ?Follow your diabetes management plan as told by your doctor. Make sure you: ?Take insulin and medicines as told. ?Follow your exercise plan. ?Follow your meal plan. Eat on time. Do not skip meals. ?Check your blood sugar as often as told. Make sure you check before and after exercise. If you   exercise longer or in a different way, check your blood sugar more often. ?Follow your sick day plan whenever you cannot eat or drink normally. Make this plan ahead of time with your doctor. ?Share your diabetes management plan with people in your workplace, school,  and household. ?Check your pee for ketones when you are ill and as told by your doctor. ?Carry a card or wear jewelry that says that you have diabetes. ?Where to find more information ?American Diabetes Association: www.diabetes.org ?Contact a doctor if: ?Your blood sugar level is at or above 240 mg/dL (13.3 mmol/L) for 2 days in a row. ?You have problems keeping your blood sugar in your target range. ?You have high blood pressure often. ?You have signs of illness, such as: ?Feeling like you may vomit (feeling nauseous). ?Vomiting. ?A fever. ?Get help right away if: ?Your blood sugar monitor reads "high" even when you are taking insulin. ?You have trouble breathing. ?You have a change in how you think, feel, or act (mental status). ?You feel like you may vomit, and the feeling does not go away. ?You cannot stop vomiting. ?These symptoms may be an emergency. Get medical help right away. Call your local emergency services (911 in the [place] ?Do not wait to see if the symptoms will go away. ?Do not drive yourself to the hospital. ?Summary ?Hyperglycemia is when the sugar (glucose) level in your blood is too high. ?High blood sugar can happen to people who have or do not have diabetes. ?Make sure you drink enough fluids and follow your meal plan. Exercise as often as told by your doctor. ?Contact your doctor if you have problems keeping your blood sugar in your target range. ?This information is not intended to replace advice given to you by your health care provider. Make sure you discuss any questions you have with your health care provider. ?Document Revised: 11/10/2019 Document Reviewed: 11/10/2019 ?Elsevier Patient Education ? 2022 Elsevier Inc. ? ?

## 2020-11-28 ENCOUNTER — Other Ambulatory Visit: Payer: Self-pay

## 2020-11-28 ENCOUNTER — Ambulatory Visit: Payer: BC Managed Care – PPO | Admitting: Physical Therapy

## 2020-11-28 ENCOUNTER — Encounter: Payer: Self-pay | Admitting: Physical Therapy

## 2020-11-28 ENCOUNTER — Ambulatory Visit: Payer: BC Managed Care – PPO

## 2020-11-28 DIAGNOSIS — R269 Unspecified abnormalities of gait and mobility: Secondary | ICD-10-CM | POA: Diagnosis not present

## 2020-11-28 DIAGNOSIS — M6281 Muscle weakness (generalized): Secondary | ICD-10-CM

## 2020-11-28 DIAGNOSIS — R278 Other lack of coordination: Secondary | ICD-10-CM

## 2020-11-28 DIAGNOSIS — R2681 Unsteadiness on feet: Secondary | ICD-10-CM | POA: Diagnosis not present

## 2020-11-28 DIAGNOSIS — R2689 Other abnormalities of gait and mobility: Secondary | ICD-10-CM | POA: Diagnosis not present

## 2020-11-28 DIAGNOSIS — R262 Difficulty in walking, not elsewhere classified: Secondary | ICD-10-CM | POA: Diagnosis not present

## 2020-11-28 DIAGNOSIS — I639 Cerebral infarction, unspecified: Secondary | ICD-10-CM | POA: Diagnosis not present

## 2020-11-28 LAB — COMPREHENSIVE METABOLIC PANEL
ALT: 29 IU/L (ref 0–44)
AST: 20 IU/L (ref 0–40)
Albumin/Globulin Ratio: 1.6 (ref 1.2–2.2)
Albumin: 5.1 g/dL — ABNORMAL HIGH (ref 3.8–4.9)
Alkaline Phosphatase: 66 IU/L (ref 44–121)
BUN/Creatinine Ratio: 13 (ref 9–20)
BUN: 12 mg/dL (ref 6–24)
Bilirubin Total: 0.4 mg/dL (ref 0.0–1.2)
CO2: 22 mmol/L (ref 20–29)
Calcium: 9.9 mg/dL (ref 8.7–10.2)
Chloride: 97 mmol/L (ref 96–106)
Creatinine, Ser: 0.95 mg/dL (ref 0.76–1.27)
Globulin, Total: 3.2 g/dL (ref 1.5–4.5)
Glucose: 110 mg/dL — ABNORMAL HIGH (ref 70–99)
Potassium: 4.2 mmol/L (ref 3.5–5.2)
Sodium: 137 mmol/L (ref 134–144)
Total Protein: 8.3 g/dL (ref 6.0–8.5)
eGFR: 93 mL/min/{1.73_m2} (ref >59)

## 2020-11-28 LAB — CBC WITH DIFFERENTIAL/PLATELET
Basophils Absolute: 0.1 10*3/uL (ref 0.0–0.2)
Basos: 1 %
EOS (ABSOLUTE): 0.1 10*3/uL (ref 0.0–0.4)
Eos: 2 %
Hematocrit: 39.7 % (ref 37.5–51.0)
Hemoglobin: 13.3 g/dL (ref 13.0–17.7)
Immature Grans (Abs): 0 10*3/uL (ref 0.0–0.1)
Immature Granulocytes: 0 %
Lymphocytes Absolute: 1.4 10*3/uL (ref 0.7–3.1)
Lymphs: 20 %
MCH: 31.1 pg (ref 26.6–33.0)
MCHC: 33.5 g/dL (ref 31.5–35.7)
MCV: 93 fL (ref 79–97)
Monocytes Absolute: 0.7 10*3/uL (ref 0.1–0.9)
Monocytes: 10 %
Neutrophils Absolute: 4.5 10*3/uL (ref 1.4–7.0)
Neutrophils: 67 %
Platelets: 375 10*3/uL (ref 150–450)
RBC: 4.27 x10E6/uL (ref 4.14–5.80)
RDW: 12.9 % (ref 11.6–15.4)
WBC: 6.8 10*3/uL (ref 3.4–10.8)

## 2020-11-28 LAB — LIPID PANEL
Chol/HDL Ratio: 3.6 ratio (ref 0.0–5.0)
Cholesterol, Total: 153 mg/dL (ref 100–199)
HDL: 43 mg/dL (ref >39)
LDL Chol Calc (NIH): 85 mg/dL (ref 0–99)
Triglycerides: 145 mg/dL (ref 0–149)
VLDL Cholesterol Cal: 25 mg/dL (ref 5–40)

## 2020-11-28 LAB — VITAMIN B12: Vitamin B-12: 371 pg/mL (ref 232–1245)

## 2020-11-28 NOTE — Therapy (Addendum)
[place] MAIN [facility] SERVICES [address] [place], [place], [address] Phone: [phone]   Fax:  [phone]  Physical Therapy Treatment  Patient Details  Name: Malik Jones MRN: [id number] Date of Birth: 12-29-1960 No data recorded  Encounter Date: 11/28/2020   PT End of Session - 11/28/20 0831     Visit Number 16    Number of Visits 24    Date for PT Re-Evaluation 12/31/20    Authorization - Visit Number 15    Authorization - Number of Visits 24    Progress Note Due on Visit 20    PT Start Time 0815    PT Stop Time 0845    PT Time Calculation (min) 30 min    Equipment Utilized During Treatment Gait belt    Activity Tolerance Patient tolerated treatment well    Behavior During Therapy WFL for tasks assessed/performed             Past Medical History:  Diagnosis Date   Diabetes mellitus without complication ([place])    Hyperlipidemia    Hypertension    Hyponatremia    Medical history non-contributory    Stroke [facility])     Past Surgical History:  Procedure Laterality Date   MANDIBLE FRACTURE SURGERY     NO PAST SURGERIES      There were no vitals filed for this visit.   Subjective Assessment - 11/28/20 0818     Subjective Pt reports he had MD appointment yesterday and learned he should be eating differently. He reports he is going to try and improve his diet and reports a lot of improvement in his diet.    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 Infarct occured on 09/11/20; no significant PMH. He attended inpatient rehab services at [facility] for 2 weeks and d/c from inpatient 10 days ago. He lived with his brother for 1 week before moving back into his own home. He is now living independently with abundant family check-ins. He is currently not driving or employed.  He states his employer has had to post his job during this time. Pt reports 1 fall immediately after stroke, otherwise no history of falls. Only PT he has recieved has been post-infarct. Pt arrivied with RW however states he rarely uses it and does nto use it in the home.    Limitations House hold activities;Walking;Lifting;Writing    How long can you sit comfortably? unaffected    How long can you stand comfortably? states standing is not a problem    How long can you walk comfortably? states walking is not a problem    Patient Stated Goals return to work and driving, be fully independent as he was before cerebellar infarct    Currently in Pain? No/denies    Pain Score 0-No pain              Treatment provided this session     Neuro Re- Ed:    Cable walking to simulate walking up and down hill  -17# anterior and bilateral sidestepping  -Cues for slow and controlled movements and equal step length bilaterally   Obstacle course airex pad -> 8 cones -> turn around red bolster -all while dribbling basketball -turn around dribbling and standing on airex, speed up from cones to last bolster (12 feet from last cone)  2 laps x  2 reps  -Patient made no errors on second attempt indicating improved coordination balance and dual motor task ability    There Act:  TRX squat with jump 2 x 10  -To improve muscular power and ability to transition with variable positions -Cues for proper foot placement as well as eccentric muscular control.   Ambulation training x5 minutes with no assistive device -Had patient ambulate at increased speeds and change direction on command also practicing transition from forward to backward walking while maintaining movement in the same direction.  Patient continues to have most difficulty with sidestepping and tandem walking exercise.  Patient had no loss of balance but does have difficulty with ambulating in a straight line with sidestepping and tandem  walking.      Pt educated throughout session about proper posture and technique with exercises. Improved exercise technique, movement at target joints, use of target muscles after min to mod verbal, visual, tactile cues.  Pt required occasional rest breaks due fatigue, PT was quick to ask when pt appeared to be fatiguing in order to prevent excessive fatigue. Pt session was cut a little short today due to pt arriving a few minutes late to scheduled appointment time Note: Portions of this document were prepared using Dragon voice recognition software and although reviewed may contain unintentional dictation errors in syntax, grammar, or spelling.                          PT Education - 11/28/20 0829     Education Details Pt educated regarding type 2 diabetes diagnosis and benefits of regular daily exercise.    Person(s) Educated Patient    Methods Explanation    Comprehension Verbalized understanding              PT Short Term Goals - 11/08/20 0808       PT SHORT TERM GOAL #1   Title Patient will be independent in home exercise program to improve strength/mobility for better functional independence with ADLs.    Time 6    Period Weeks    Status Achieved    Target Date 11/19/20               PT Long Term Goals - 11/11/20 1003       PT LONG TERM GOAL #1   Title Patient will increase FOTO score to equal to or greater than 77 to demonstrate statistically significant improvement in mobility and quality of life.    Baseline 10/08/20: 63, 9/30: 67    Time 12    Period Weeks    Status On-going    Target Date 12/31/20      PT LONG TERM GOAL #2   Title Pt will improve BERG by at least 3 points in order to demonstrate clinically significant improvement in balance.    Baseline 42 10/11/20    Time 8    Period Weeks    Status Achieved      PT LONG TERM GOAL #3   Title Patient will reduce timed up and go to <11 seconds to reduce fall risk and  demonstrate improved transfer/gait ability.    Baseline 10/08/20: 12.68 seconds;10.3 sec    Time 12    Period Weeks    Status Achieved      PT LONG TERM GOAL #4   Title Patient will increase six minute walk test distance to >1000 for progression to community ambulator and improve gait ability  Baseline 10/08/20: 948f    Time 12    Period Weeks    Status Partially Met    Target Date 12/31/20      PT LONG TERM GOAL #5   Title Patient will increase Functional Gait Assessment score to >23/30 as to reduce fall risk and improve dynamic gait safety with community ambulation.    Baseline 10/15/20: 14/30. 20/30 9/30, revised goal this session    Time 12    Period Weeks    Status Revised    Target Date 12/31/20      PT LONG TERM GOAL #6   Title Pt will improve score on mini BEST test by 4 points or greater in order to indicate improved balance and decreased risk of falls.    Baseline to be assessed visit 11 10/3: 20/28    Time 7    Period Weeks    Status New    Target Date 12/27/20                   Plan - 11/28/20 0831     Clinical Impression Statement Patient demonstrates good tolerance for therapeutic activities and neuromuscular reeducation exercises on today's session.  Patient demonstrated improved ability to focus on more than one task and his ability to navigate balance course with basketball increased speeds and change direction without loss of balance.  Patient's still has difficulty with dual tasking utilizing his right upper extremity for the dual motor task.  Patient will continue to benefit from skilled physical therapy services in order to improve his lower extremity strength, balance, and facilitate return to his prior level of function.  Patient continues to demonstrate great motivation throughout physical therapy session and also his completion of his home exercises.    Personal Factors and Comorbidities Age;Comorbidity 1;Comorbidity 2    Comorbidities HTN , DM     Examination-Activity Limitations Locomotion Level;Transfers;Stairs    Examination-Participation Restrictions Driving;Occupation;Community Activity;Yard Work    Stability/Clinical Decision Making Stable/Uncomplicated    Rehab Potential Excellent    PT Frequency 2x / week    PT Duration 12 weeks    PT Treatment/Interventions ADLs/Self Care Home Management;Aquatic Therapy;Biofeedback;Cryotherapy;Electrical Stimulation;Moist Heat;Traction;Ultrasound;Gait training;DME Instruction;Stair training;Functional mobility training;Therapeutic activities;Neuromuscular re-education;Balance training;Therapeutic exercise;Patient/family education;Manual techniques    PT Next Visit Plan begin dynamc gait training activity, continue with LE strengthening program    PT Home Exercise Plan Provided Access Code: [email or web address] URL: [email or web address]  Date: 10/11/2020  Prepared by: CRivka Barbara   Consulted and Agree with Plan of Care Patient             Patient will benefit from skilled therapeutic intervention in order to improve the following deficits and impairments:  Abnormal gait, Decreased activity tolerance, Decreased endurance, Impaired sensation, Improper body mechanics, Decreased balance, Decreased coordination, Decreased mobility, Decreased safety awareness, Difficulty walking  Visit Diagnosis: Muscle weakness (generalized)  Abnormality of gait and mobility  Unsteadiness on feet  Other abnormalities of gait and mobility  Difficulty in walking, not elsewhere classified     Problem List Patient Active Problem List   Diagnosis Date Noted   Type 2 diabetes mellitus with neurological complications ([place] 130/94/0768  Mixed hyperlipidemia 11/27/2020   Primary hypertension 11/27/2020   Vitamin D deficiency 10/04/2020   Mediastinal adenopathy 10/04/2020   Hyponatremia    Transaminitis    Diabetes mellitus, new onset ([place]    Malnutrition of moderate degree 09/13/2020   Stroke  ([place] 09/12/2020   Aortic thrombus ([place]  Hyperlipidemia    Essential hypertension    Impaired fasting glucose    Cerebellar stroke ([place]) 09/11/2020    Particia Lather, PT 11/28/2020, 10:36 AM  [place] MAIN [facility] SERVICES [address] [place], [place], [address] Phone: [phone]   Fax:  [phone]  Name: Malik Jones MRN: [id number] Date of Birth: Dec 19, 1960

## 2020-11-29 NOTE — Therapy (Signed)
[place] MAIN [facility] SERVICES [address] [place], [place], [address] Phone: [phone]   Fax:  [phone]  Occupational Therapy Treatment  Patient Details  Name: Malik Jones MRN: [id number] Date of Birth: [birth year] Referring Provider (OT): Dr. Leeroy Cha   Encounter Date: 11/28/2020   OT End of Session - 11/29/20 1236     Visit Number 16    Number of Visits 24    Date for OT Re-Evaluation 12/30/20    Authorization Time Period Reporting period starting 10/08/2020    OT Start Time 0845    OT Stop Time 0930    OT Time Calculation (min) 45 min    Activity Tolerance Patient tolerated treatment well    Behavior During Therapy [facility] for tasks assessed/performed             Past Medical History:  Diagnosis Date   Diabetes mellitus without complication ([place])    Hyperlipidemia    Hypertension    Hyponatremia    Medical history non-contributory    Stroke [facility])     Past Surgical History:  Procedure Laterality Date   MANDIBLE FRACTURE SURGERY     NO PAST SURGERIES      There were no vitals filed for this visit.   Subjective Assessment - 11/28/20 1235     Subjective  "I have a new medication I have to start taking for my sugar."    Pertinent History cerebellar stroke    Limitations balance, R sided weakness and ataxia    Patient Stated Goals "I want to get back to normal."    Currently in Pain? No/denies    Pain Score 0-No pain    Multiple Pain Sites No            Occupational Therapy Treatment: Neuro re-ed: RUE GMC addressed with dribbling ball, balancing ball with outstretched arm.  Addressed [facility] with picking up small stones and placing on top of upright jumbo pegs, practiced placing/removing stones without knocking down pegs.    Self Care: Assisted pt with referral to Dietician/Nutritionist at [place].  Pt has concerns about his PCP follow up appointment from yesterday as he was sent home with new  prescription for Metformin, now needing education on foods to help manage sugar levels.  Reinforced benefits of regular exercise, advised on lower sugar option foods, and minimizing foods with sugar.  Pt receptive to all and will follow up with [place] via phone next week.   Response to Treatment: See Plan/clinical impression below.    OT Education - 11/28/20 1235     Education Details healthy eating/hospital resources for nutrition education    Person(s) Educated Patient    Methods Explanation;Demonstration;Verbal cues    Comprehension Verbalized understanding;Verbal cues required;Need further instruction;Returned demonstration              OT Short Term Goals - 11/08/20 1618       OT SHORT TERM GOAL #1   Title Pt will be indep with HEP for RUE strength/coordination    Baseline Eval: HEP initiated at eval, will continue to add on; indep with HEP    Time 6    Period Weeks    Status Achieved    Target Date 11/18/20               OT Long Term Goals - 11/08/20 1618       OT LONG TERM GOAL #1   Title Pt will increase FOTO score to 60  or better to indicate increased functional performance.    Baseline Eval: FOTO 52; 11/08/20: FOTO score 67    Time 12    Period Weeks    Status On-going    Target Date 12/30/20      OT LONG TERM GOAL #2   Title Pt will increase R grip strength to 80 lbs or better to improve carrying heavier ADL supplies with R dominant hand.    Baseline Eval: R grip 65 lbs (L non-dominant 76 lbs); 11/08/20: R grip 79 lbs    Time 12    Period Weeks    Status Partially Met    Target Date 12/30/20      OT LONG TERM GOAL #3   Title Pt will improve ability to pick up and manipulate small ADL supplies without dropping with R dominant hand as demonstrated by improved 9 hole peg test score to <30 sec or better.    Baseline Eval: R 9 hole peg test 56.3 sec (L non-dominant 20.6 sec); 11/08/2020: 9 hole peg test 48 sec    Time 12    Period Weeks     Status Partially Met    Target Date 12/30/20      OT LONG TERM GOAL #4   Title Pt will improve R GMC as demonstrated by ability to brush teeth, shave, eat with R dominant hand.    Baseline Eval: Pt using L non-dominant hand to brush teeth, shave, eat d/t moderate ataxia throughout RUE; 11/08/20: Pt still brushing teeth with L hand, son shaves him, but pt can eat finger foods with R hand.  Pt can self feed apple sauce with spoon in R hand with moderate ataxia, some spilling.    Time 12    Period Weeks    Status On-going    Target Date 12/30/20              Plan - 11/28/20 1247     Clinical Impression Statement Pt continues to address RUE incoordination deficits with skilled OT, and making steady progress with controlled movements proximally and distally throughout RUE.  Pt more easily able to place stones atop upright jumbo pegs, with occasional knocking down of pegs when trying to remove stones from an upright peg or when reaching between pegs when a stone had dropped.  Pt doing well to incorporate R hand into drinking when cup is light and not filled to the top.  Pt has concerns about his PCP follow up appointment from yesterday as he was sent home with new prescription for Metformin, now needing education on foods to help manage sugar levels.  Reinforced benefits of regular exercise, advised on lower sugar option foods, and minimizing foods with sugar.  Pt receptive to all and will follow up with [place] via phone next week.    OT Occupational Profile and History Detailed Assessment- Review of Records and additional review of physical, cognitive, psychosocial history related to current functional performance    Occupational performance deficits (Please refer to evaluation for details): ADL's;IADL's;Leisure    Body Structure / Function / Physical Skills ADL;Dexterity;Strength;Balance;Coordination;FMC;IADL;Endurance;Sensation;UE functional use;GMC    Rehab Potential Excellent     Clinical Decision Making Several treatment options, min-mod task modification necessary    Comorbidities Affecting Occupational Performance: May have comorbidities impacting occupational performance    Modification or Assistance to Complete Evaluation  No modification of tasks or assist necessary to complete eval    OT Frequency 2x / week    OT Duration 12  weeks    OT Treatment/Interventions Self-care/ADL training;Therapeutic exercise;DME and/or AE instruction;Balance training;Neuromuscular education;Energy conservation;Therapeutic activities;Patient/family education;Coping strategies training    OT Home Exercise Plan theraputty increased to blue and instructed in exercises today    Consulted and Agree with Plan of Care Patient             Patient will benefit from skilled therapeutic intervention in order to improve the following deficits and impairments:   Body Structure / Function / Physical Skills: ADL, Dexterity, Strength, Balance, Coordination, FMC, IADL, Endurance, Sensation, UE functional use, GMC       Visit Diagnosis: Other lack of coordination  Muscle weakness (generalized)  Cerebellar stroke ([place])    Problem List Patient Active Problem List   Diagnosis Date Noted   Type 2 diabetes mellitus with neurological complications ([place]) 03/70/9643   Mixed hyperlipidemia 11/27/2020   Primary hypertension 11/27/2020   Vitamin D deficiency 10/04/2020   Mediastinal adenopathy 10/04/2020   Hyponatremia    Transaminitis    Diabetes mellitus, new onset ([place])    Malnutrition of moderate degree 09/13/2020   Stroke ([place]) 09/12/2020   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 [place], [place], [address] Phone: [phone]    Fax:  [phone]  Name: Adriano Bischof MRN: [id number] Date of Birth: December 13, 1960

## 2020-12-03 ENCOUNTER — Ambulatory Visit: Payer: BC Managed Care – PPO

## 2020-12-03 ENCOUNTER — Ambulatory Visit: Payer: BC Managed Care – PPO | Admitting: Physical Therapy

## 2020-12-05 ENCOUNTER — Other Ambulatory Visit: Payer: Self-pay

## 2020-12-05 ENCOUNTER — Ambulatory Visit: Payer: BC Managed Care – PPO | Admitting: Physical Therapy

## 2020-12-05 ENCOUNTER — Ambulatory Visit: Payer: BC Managed Care – PPO

## 2020-12-05 DIAGNOSIS — R278 Other lack of coordination: Secondary | ICD-10-CM | POA: Insufficient documentation

## 2020-12-05 DIAGNOSIS — M6281 Muscle weakness (generalized): Secondary | ICD-10-CM

## 2020-12-05 DIAGNOSIS — R2689 Other abnormalities of gait and mobility: Secondary | ICD-10-CM | POA: Insufficient documentation

## 2020-12-05 DIAGNOSIS — R2681 Unsteadiness on feet: Secondary | ICD-10-CM | POA: Insufficient documentation

## 2020-12-05 DIAGNOSIS — I639 Cerebral infarction, unspecified: Secondary | ICD-10-CM

## 2020-12-05 DIAGNOSIS — R262 Difficulty in walking, not elsewhere classified: Secondary | ICD-10-CM | POA: Diagnosis not present

## 2020-12-05 DIAGNOSIS — R269 Unspecified abnormalities of gait and mobility: Secondary | ICD-10-CM | POA: Diagnosis not present

## 2020-12-05 NOTE — Therapy (Signed)
[place] MAIN [facility] SERVICES [address] [place], [place], [address] Phone: [phone]   Fax:  [phone]  Physical Therapy Treatment  Patient Details  Name: Malik Jones MRN: [id number] Date of Birth: 1960/09/10 No data recorded  Encounter Date: 12/05/2020   PT End of Session - 12/05/20 0834     Visit Number 17    Number of Visits 24    Date for PT Re-Evaluation 12/31/20    Authorization - Visit Number 16    Authorization - Number of Visits 24    Progress Note Due on Visit 20    PT Start Time 0825    PT Stop Time 0845    PT Time Calculation (min) 20 min    Equipment Utilized During Treatment Gait belt    Activity Tolerance Patient tolerated treatment well    Behavior During Therapy WFL for tasks assessed/performed             Past Medical History:  Diagnosis Date   Diabetes mellitus without complication ([place])    Hyperlipidemia    Hypertension    Hyponatremia    Medical history non-contributory    Stroke [facility])     Past Surgical History:  Procedure Laterality Date   MANDIBLE FRACTURE SURGERY     NO PAST SURGERIES      There were no vitals filed for this visit.   Subjective Assessment - 12/05/20 0828     Subjective Pt reports his foot was hurting following working in a room for a prolonged period in the morning. Pt reports his left foot was swollen following and hurting too bad to do much of anything. He reports over the past 2 days it has gotten a lot better and the swelling as decrased significantly. PT/author examined LE and foot and did not see any signs of infection, botes, wounds etc.    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 Infarct occured on 09/11/20; no significant PMH. He attended inpatient rehab services at [facility] for 2 weeks and d/c from  inpatient 10 days ago. He lived with his brother for 1 week before moving back into his own home. He is now living independently with abundant family check-ins. He is currently not driving or employed. He states his employer has had to post his job during this time. Pt reports 1 fall immediately after stroke, otherwise no history of falls. Only PT he has recieved has been post-infarct. Pt arrivied with RW however states he rarely uses it and does nto use it in the home.    Limitations House hold activities;Walking;Lifting;Writing    How long can you sit comfortably? unaffected    How long can you stand comfortably? states standing is not a problem    Patient Stated Goals return to work and driving, be fully independent as he was before cerebellar infarct                Treatment provided this session  Assessment of L foot: Pt did present with min pain in his left foot that kept him from attendign his PT session earlier this week. The left foot was briefly assesseed by PT and there were min s/s of swelling, dorsal pedis pulse was present, no signs of bruising, bite marks, cuts or any s/s of infection in the foot. Pt was instructed to monitor foot over the  weekend but otherwise to complete his activities as normal. -Pt reports no injury to foot and sensation appeared intact with exam today although formal monofilament testing for sensation was not completed.   Neuro Re- Ed:    Obstacle course airex pad -> 8 cones weaving in and out and changind hands (crossover) each cone -all while dribbling basketball -turn around dribbling and standing on airex, speed up from cones to last bolster (12 feet from last cone)  2 laps x 2 reps  -Patient made no errors on second attempt indicating improved coordination balance and dual motor task ability -no error of knocking over cones today and improved efficacy with bilateral UE dribbling  -rated high difficulty  Standing on airex pad with basketball  pass x 25 -no LOB, passes were varied in order to make patient reach outside of normal BOS -rated moderate Ambulation training x5 minutes with no assistive device -cues for longer step length on the right LE, with moderate cueing pt demonstrated closer to normal step length bilaterally.     Pt session was cut a little short today due to pt arriving  late to scheduled appointment time. Further treatment time was also utilized assessing pt L foot to ensure no serious injury or further consultation would be warranted at this time.   Pt required occasional rest breaks due fatigue, PT was quick to ask when pt appeared to be fatiguing in order to prevent excessive fatigue.    Pt educated throughout session about proper posture and technique with exercises. Improved exercise technique, movement at target joints, use of target muscles after min to mod verbal, visual, tactile cues.                         PT Education - 12/05/20 [id number]     Education Details Exercise form and technique    Person(s) Educated Patient    Methods Explanation    Comprehension Verbalized understanding              PT Short Term Goals - 11/08/20 0808       PT SHORT TERM GOAL #1   Title Patient will be independent in home exercise program to improve strength/mobility for better functional independence with ADLs.    Time 6    Period Weeks    Status Achieved    Target Date 11/19/20               PT Long Term Goals - 11/11/20 1003       PT LONG TERM GOAL #1   Title Patient will increase FOTO score to equal to or greater than 77 to demonstrate statistically significant improvement in mobility and quality of life.    Baseline 10/08/20: 63, 9/30: 67    Time 12    Period Weeks    Status On-going    Target Date 12/31/20      PT LONG TERM GOAL #2   Title Pt will improve BERG by at least 3 points in order to demonstrate clinically significant improvement in balance.    Baseline 42  10/11/20    Time 8    Period Weeks    Status Achieved      PT LONG TERM GOAL #3   Title Patient will reduce timed up and go to <11 seconds to reduce fall risk and demonstrate improved transfer/gait ability.    Baseline 10/08/20: 12.68 seconds;10.3 sec    Time 12    Period Weeks  Status Achieved      PT LONG TERM GOAL #4   Title Patient will increase six minute walk test distance to >1000 for progression to community ambulator and improve gait ability    Baseline 10/08/20: 949f    Time 12    Period Weeks    Status Partially Met    Target Date 12/31/20      PT LONG TERM GOAL #5   Title Patient will increase Functional Gait Assessment score to >23/30 as to reduce fall risk and improve dynamic gait safety with community ambulation.    Baseline 10/15/20: 14/30. 20/30 9/30, revised goal this session    Time 12    Period Weeks    Status Revised    Target Date 12/31/20      PT LONG TERM GOAL #6   Title Pt will improve score on mini BEST test by 4 points or greater in order to indicate improved balance and decreased risk of falls.    Baseline to be assessed visit 11 10/3: 20/28    Time 7    Period Weeks    Status New    Target Date 12/27/20                   Plan - 12/05/20 0835     Clinical Impression Statement Pt doemnstrated good tolerance for therapeutic exercise in today's session. Pt did present with min pain in his left foot that kept him from attendign his PT session earlier this week. The left foot was briefly assesseed by PT and there were min s/s of swelling, dorsal pedis pulse was present, no signs of bruising, bite marks, cuts or any s/s of infection in the foot. Pt was instructed to monitor foot over the weekend but otherwise to complete his activities as normal. Pt demonstrated improved ability to navigate balance course in today's session and was also able to better manage dual task intervention involving the R UE. Pt also demonstrated improved walkign efficacy  with closer to equal step length following cues and practice walking for approximately 300 feet. Pt will continue to benefit from skilled PT intervention in order to improve his LE strength, endurance, balance, coordination and to restore his PLOF.    Personal Factors and Comorbidities Age;Comorbidity 1;Comorbidity 2    Comorbidities HTN , DM    Examination-Activity Limitations Locomotion Level;Transfers;Stairs    Examination-Participation Restrictions Driving;Occupation;Community Activity;Yard Work    Stability/Clinical Decision Making Stable/Uncomplicated    Rehab Potential Excellent    PT Frequency 2x / week    PT Duration 12 weeks    PT Treatment/Interventions ADLs/Self Care Home Management;Aquatic Therapy;Biofeedback;Cryotherapy;Electrical Stimulation;Moist Heat;Traction;Ultrasound;Gait training;DME Instruction;Stair training;Functional mobility training;Therapeutic activities;Neuromuscular re-education;Balance training;Therapeutic exercise;Patient/family education;Manual techniques    PT Next Visit Plan begin dynamc gait training activity, continue with LE strengthening program    PT Home Exercise Plan Provided Access Code: [email or web address] URL: [email or web address]  Date: 10/11/2020  Prepared by: CRivka Barbara   Consulted and Agree with Plan of Care Patient             Patient will benefit from skilled therapeutic intervention in order to improve the following deficits and impairments:  Abnormal gait, Decreased activity tolerance, Decreased endurance, Impaired sensation, Improper body mechanics, Decreased balance, Decreased coordination, Decreased mobility, Decreased safety awareness, Difficulty walking  Visit Diagnosis: Muscle weakness (generalized)  Abnormality of gait and mobility  Unsteadiness on feet  Other abnormalities of gait and mobility  Difficulty in walking, not elsewhere classified  Other lack  of coordination     Problem List Patient Active  Problem List   Diagnosis Date Noted   Muscle weakness (generalized) 12/05/2020   Abnormality of gait and mobility 12/05/2020   Unsteadiness on feet 12/05/2020   Other abnormalities of gait and mobility 12/05/2020   Difficulty in walking, not elsewhere classified 12/05/2020   Other lack of coordination 12/05/2020   Type 2 diabetes mellitus with neurological complications ([place]) 88/82/8003   Mixed hyperlipidemia 11/27/2020   Primary hypertension 11/27/2020   Vitamin D deficiency 10/04/2020   Mediastinal adenopathy 10/04/2020   Hyponatremia    Transaminitis    Diabetes mellitus, new onset ([place])    Malnutrition of moderate degree 09/13/2020   Stroke ([place]) 09/12/2020   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

## 2020-12-06 NOTE — Therapy (Signed)
[place] MAIN [facility] SERVICES [address] [place], [place], [address] Phone: [phone]   Fax:  [phone]  Occupational Therapy Treatment  Patient Details  Name: Malik Jones MRN: [id number] Date of Birth: 18-Dec-1960 Referring Provider (OT): Dr. Leeroy Cha   Encounter Date: 12/05/2020   OT End of Session - 12/06/20 0806     Visit Number 17    Number of Visits 24    Date for OT Re-Evaluation 12/30/20    Authorization Time Period Reporting period starting 10/08/2020    OT Start Time 0845    OT Stop Time 0928    OT Time Calculation (min) 43 min    Activity Tolerance Patient tolerated treatment well    Behavior During Therapy [facility] for tasks assessed/performed             Past Medical History:  Diagnosis Date   Diabetes mellitus without complication ([place])    Hyperlipidemia    Hypertension    Hyponatremia    Medical history non-contributory    Stroke [facility])     Past Surgical History:  Procedure Laterality Date   MANDIBLE FRACTURE SURGERY     NO PAST SURGERIES      There were no vitals filed for this visit.   Subjective Assessment - 12/05/20 0805     Subjective  "I haven't heard from the [place] yet."    Pertinent History cerebellar stroke    Limitations balance, R sided weakness and ataxia    Patient Stated Goals "I want to get back to normal."    Currently in Pain? No/denies    Pain Score 0-No pain            Occupational Therapy Treatment: Neuro re-ed: Used Jamar pegs on elevated inclined surface, placing in peg board with RUE to address FMC/GMC; OT facilitated various extended reaching patterns moving pegboard forward/left/right of center on table top.  Further addressed [place] with drawing/tracing/writing on white board using RUE in vertical, horizontal, and circular patterns.  Used basketball to dribble, bounce, catch in R hand with improved dribbling and improved control of ball with outstretched  arm.    Response to Treatment: Good ability to pick up small Jamar pegs from table top without non skid surface.  Mild ataxia with extended reaching using RUE.  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  Pt reports currently limited standing tolerance and slower speed when lifting and reaching with RUE would slow him down at work.  OT sessions will continue to address reaching, carrying, and motor control throughout RUE for future return to work skills.     OT Education - 12/05/20 0805     Education Details HEP progression    Person(s) Educated Patient    Methods Explanation;Demonstration;Verbal cues    Comprehension Verbalized understanding;Verbal cues required;Need further instruction;Returned demonstration              OT Short Term Goals - 11/08/20 1618       OT SHORT TERM GOAL #1   Title Pt will be indep with HEP for RUE strength/coordination    Baseline Eval: HEP initiated at eval, will continue to add on; indep with HEP    Time 6    Period Weeks    Status Achieved    Target Date 11/18/20               OT  Long Term Goals - 11/08/20 1618       OT LONG TERM GOAL #1   Title Pt will increase FOTO score to 60 or better to indicate increased functional performance.    Baseline Eval: FOTO 52; 11/08/20: FOTO score 67    Time 12    Period Weeks    Status On-going    Target Date 12/30/20      OT LONG TERM GOAL #2   Title Pt will increase R grip strength to 80 lbs or better to improve carrying heavier ADL supplies with R dominant hand.    Baseline Eval: R grip 65 lbs (L non-dominant 76 lbs); 11/08/20: R grip 79 lbs    Time 12    Period Weeks    Status Partially Met    Target Date 12/30/20      OT LONG TERM GOAL #3   Title Pt will improve ability to pick up and manipulate small ADL supplies without dropping with R dominant hand as demonstrated by improved 9 hole peg test  score to <30 sec or better.    Baseline Eval: R 9 hole peg test 56.3 sec (L non-dominant 20.6 sec); 11/08/2020: 9 hole peg test 48 sec    Time 12    Period Weeks    Status Partially Met    Target Date 12/30/20      OT LONG TERM GOAL #4   Title Pt will improve R GMC as demonstrated by ability to brush teeth, shave, eat with R dominant hand.    Baseline Eval: Pt using L non-dominant hand to brush teeth, shave, eat d/t moderate ataxia throughout RUE; 11/08/20: Pt still brushing teeth with L hand, son shaves him, but pt can eat finger foods with R hand.  Pt can self feed apple sauce with spoon in R hand with moderate ataxia, some spilling.    Time 12    Period Weeks    Status On-going    Target Date 12/30/20               Plan - 12/05/20 0815     Clinical Impression Statement Good ability to pick up small Jamar pegs from table top without non skid surface.  Mild ataxia with extended reaching using RUE.  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  Pt reports currently limited standing tolerance and slower speed when lifting and reaching with RUE would slow him down at work.  OT sessions will continue to address reaching, carrying, and motor control throughout RUE for future return to work skills.    OT Occupational Profile and History Detailed Assessment- Review of Records and additional review of physical, cognitive, psychosocial history related to current functional performance    Occupational performance deficits (Please refer to evaluation for details): ADL's;IADL's;Leisure    Body Structure / Function / Physical Skills ADL;Dexterity;Strength;Balance;Coordination;FMC;IADL;Endurance;Sensation;UE functional use;GMC    Rehab Potential Excellent    Clinical Decision Making Several treatment options, min-mod task modification necessary    Comorbidities Affecting Occupational Performance: May have  comorbidities impacting occupational performance    Modification or Assistance to Complete Evaluation  No modification of tasks or assist necessary to complete eval    OT Frequency 2x / week    OT Duration 12 weeks    OT Treatment/Interventions Self-care/ADL training;Therapeutic exercise;DME and/or AE instruction;Balance training;Neuromuscular education;Energy conservation;Therapeutic activities;Patient/family education;Coping strategies training    OT Home Exercise Plan  theraputty increased to blue and instructed in exercises today    Consulted and Agree with Plan of Care Patient             Patient will benefit from skilled therapeutic intervention in order to improve the following deficits and impairments:   Body Structure / Function / Physical Skills: ADL, Dexterity, Strength, Balance, Coordination, FMC, IADL, Endurance, Sensation, UE functional use, GMC       Visit Diagnosis: Muscle weakness (generalized)  Other lack of coordination  Cerebellar stroke [facility])    Problem List Patient Active Problem List   Diagnosis Date Noted   Muscle weakness (generalized) 12/05/2020   Abnormality of gait and mobility 12/05/2020   Unsteadiness on feet 12/05/2020   Other abnormalities of gait and mobility 12/05/2020   Difficulty in walking, not elsewhere classified 12/05/2020   Other lack of coordination 12/05/2020   Type 2 diabetes mellitus with neurological complications ([place]) 91/07/8164   Mixed hyperlipidemia 11/27/2020   Primary hypertension 11/27/2020   Vitamin D deficiency 10/04/2020   Mediastinal adenopathy 10/04/2020   Hyponatremia    Transaminitis    Diabetes mellitus, new onset ([place])    Malnutrition of moderate degree 09/13/2020   Stroke ([place]) 09/12/2020   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

## 2020-12-09 ENCOUNTER — Other Ambulatory Visit: Payer: Self-pay | Admitting: Physical Medicine and Rehabilitation

## 2020-12-10 ENCOUNTER — Ambulatory Visit: Payer: BC Managed Care – PPO

## 2020-12-10 ENCOUNTER — Other Ambulatory Visit: Payer: Self-pay

## 2020-12-10 ENCOUNTER — Ambulatory Visit: Payer: BC Managed Care – PPO | Attending: Physical Medicine and Rehabilitation | Admitting: Physical Therapy

## 2020-12-10 ENCOUNTER — Encounter: Payer: Self-pay | Admitting: Physical Therapy

## 2020-12-10 DIAGNOSIS — I639 Cerebral infarction, unspecified: Secondary | ICD-10-CM | POA: Insufficient documentation

## 2020-12-10 DIAGNOSIS — R269 Unspecified abnormalities of gait and mobility: Secondary | ICD-10-CM | POA: Diagnosis not present

## 2020-12-10 DIAGNOSIS — R2681 Unsteadiness on feet: Secondary | ICD-10-CM | POA: Diagnosis not present

## 2020-12-10 DIAGNOSIS — M6281 Muscle weakness (generalized): Secondary | ICD-10-CM

## 2020-12-10 DIAGNOSIS — R262 Difficulty in walking, not elsewhere classified: Secondary | ICD-10-CM | POA: Insufficient documentation

## 2020-12-10 DIAGNOSIS — R278 Other lack of coordination: Secondary | ICD-10-CM

## 2020-12-10 DIAGNOSIS — R2689 Other abnormalities of gait and mobility: Secondary | ICD-10-CM | POA: Diagnosis not present

## 2020-12-10 NOTE — Therapy (Signed)
[place] MAIN [facility] SERVICES [address] [place], [place], [address] Phone: [phone]   Fax:  [phone]  Physical Therapy Treatment  Patient Details  Name: Malik Jones MRN: [id number] Date of Birth: Jul 03, 1960 No data recorded  Encounter Date: 12/10/2020   PT End of Session - 12/10/20 1530     Visit Number 18    Number of Visits 24    Date for PT Re-Evaluation 12/31/20    Authorization - Visit Number 16    Authorization - Number of Visits 24    Progress Note Due on Visit 20    PT Start Time 0814    PT Stop Time 0845    PT Time Calculation (min) 31 min    Equipment Utilized During Treatment Gait belt    Activity Tolerance Patient tolerated treatment well    Behavior During Therapy WFL for tasks assessed/performed             Past Medical History:  Diagnosis Date   Diabetes mellitus without complication ([place])    Hyperlipidemia    Hypertension    Hyponatremia    Medical history non-contributory    Stroke [facility])     Past Surgical History:  Procedure Laterality Date   MANDIBLE FRACTURE SURGERY     NO PAST SURGERIES      There were no vitals filed for this visit.   Subjective Assessment - 12/10/20 0818     Subjective Reports his left foot is feeling better. Denies general pain. States he bought a BP cuff to monitor his vitals.    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 Infarct occured on 09/11/20; no significant PMH. He attended inpatient rehab services at [facility] for 2 weeks and d/c from inpatient 10 days ago. He lived with his brother for 1 week before moving back into his own home. He is now living independently with abundant family check-ins. He is currently not driving or employed. He states his employer has had to post his job during this time. Pt reports  1 fall immediately after stroke, otherwise no history of falls. Only PT he has recieved has been post-infarct. Pt arrivied with RW however states he rarely uses it and does nto use it in the home.    Limitations House hold activities;Walking;Lifting;Writing    How long can you sit comfortably? unaffected    How long can you stand comfortably? states standing is not a problem    How long can you walk comfortably? states walking is not a problem    Currently in Pain? No/denies              Neuro Re- Ed:      Cable walking to simulate walking up and down hill  -17# forward/backward stepping, x5 reps each -17# lateral stepping, x3 reps each direction  -Cues for slow and controlled movements and equal step length bilaterally  Side stepping 15f, no resistance, VC to increase stepping cadence. More difficulty leading with RLE due to coordination and lag time of RLE while trunk continues to shift outside BOS.   Grapevine (carioca), 182f 6x each direction. Progressively improved crossover stepping and LE coordination.    There Act:   TRX squat jump 2 x 10  -To improve muscular power and ability to transition with variable positions -Cues for proper foot placement, equal landing time, equal  weight placement, eccentric muscular control.  TRX backward lunge, 2 x 10 BLE -cues for step length, foot placement, weight shifting forward, balance       Next session ideas: Ladder drills  Forward - - 1 foot in each - 2 feet in each - "in-in-out-out" Lateral - - 2 feet in each - "in-in-out-out" zipper      Clinical Impression: Pt demonstrates excellent motivation throughout session, agreeable and determined to master new exercises. Squat jumps using TRX suspension system was continued - he continues to have difficulty with the landing, VC for proper foot placement, equal landing time, equal weight placement, eccentric muscular control. New exercises were initiated including TRX lunges  and crossover stepping via grapevine. Performance significantly improved as more reps were completed however pt would benefit from continued practice due to difficulty with consistency, coordination and stability.  Pt will continue to benefit from skilled PT intervention in order to improve his LE strength, endurance, balance, coordination and to restore his PLOF.            PT Short Term Goals - 11/08/20 0300       PT SHORT TERM GOAL #1   Title Patient will be independent in home exercise program to improve strength/mobility for better functional independence with ADLs.    Time 6    Period Weeks    Status Achieved    Target Date 11/19/20               PT Long Term Goals - 11/11/20 1003       PT LONG TERM GOAL #1   Title Patient will increase FOTO score to equal to or greater than 77 to demonstrate statistically significant improvement in mobility and quality of life.    Baseline 10/08/20: 63, 9/30: 67    Time 12    Period Weeks    Status On-going    Target Date 12/31/20      PT LONG TERM GOAL #2   Title Pt will improve BERG by at least 3 points in order to demonstrate clinically significant improvement in balance.    Baseline 42 10/11/20    Time 8    Period Weeks    Status Achieved      PT LONG TERM GOAL #3   Title Patient will reduce timed up and go to <11 seconds to reduce fall risk and demonstrate improved transfer/gait ability.    Baseline 10/08/20: 12.68 seconds;10.3 sec    Time 12    Period Weeks    Status Achieved      PT LONG TERM GOAL #4   Title Patient will increase six minute walk test distance to >1000 for progression to community ambulator and improve gait ability    Baseline 10/08/20: 977f    Time 12    Period Weeks    Status Partially Met    Target Date 12/31/20      PT LONG TERM GOAL #5   Title Patient will increase Functional Gait Assessment score to >23/30 as to reduce fall risk and improve dynamic gait safety with community ambulation.     Baseline 10/15/20: 14/30. 20/30 9/30, revised goal this session    Time 12    Period Weeks    Status Revised    Target Date 12/31/20      PT LONG TERM GOAL #6   Title Pt will improve score on mini BEST test by 4 points or greater in order to indicate improved balance and decreased risk of  falls.    Baseline to be assessed visit 11 10/3: 20/28    Time 7    Period Weeks    Status New    Target Date 12/27/20                   Plan - 12/10/20 1531     Clinical Impression Statement Pt demonstrates excellent motivation throughout session, agreeable and determined to master new exercises. Squat jumps using TRX suspension system was continued - he continues to have difficulty with the landing, VC for proper foot placement, equal landing time, equal weight placement, eccentric muscular control. New exercises were initiated including TRX lunges and crossover stepping via grapevine. Performance significantly improved as more reps were completed however pt would benefit from continued practice due to difficulty with consistency, coordination and stability.  Pt will continue to benefit from skilled PT intervention in order to improve his LE strength, endurance, balance, coordination and to restore his PLOF.    Personal Factors and Comorbidities Age;Comorbidity 1;Comorbidity 2    Comorbidities HTN , DM    Examination-Activity Limitations Locomotion Level;Transfers;Stairs    Examination-Participation Restrictions Driving;Occupation;Community Activity;Yard Work    Stability/Clinical Decision Making Stable/Uncomplicated    Rehab Potential Excellent    PT Frequency 2x / week    PT Duration 12 weeks    PT Treatment/Interventions ADLs/Self Care Home Management;Aquatic Therapy;Biofeedback;Cryotherapy;Electrical Stimulation;Moist Heat;Traction;Ultrasound;Gait training;DME Instruction;Stair training;Functional mobility training;Therapeutic activities;Neuromuscular re-education;Balance training;Therapeutic  exercise;Patient/family education;Manual techniques    PT Next Visit Plan begin dynamc gait training activity, continue with LE strengthening program    PT Home Exercise Plan Provided Access Code: [email or web address]  URL: [email or web address]  Date: 10/11/2020  Prepared by: Rivka Barbara    Consulted and Agree with Plan of Care Patient             Patient will benefit from skilled therapeutic intervention in order to improve the following deficits and impairments:  Abnormal gait, Decreased activity tolerance, Decreased endurance, Impaired sensation, Improper body mechanics, Decreased balance, Decreased coordination, Decreased mobility, Decreased safety awareness, Difficulty walking  Visit Diagnosis: Abnormality of gait and mobility  Other lack of coordination  Difficulty in walking, not elsewhere classified  Unsteadiness on feet  Muscle weakness (generalized)  Other abnormalities of gait and mobility     Problem List Patient Active Problem List   Diagnosis Date Noted   Muscle weakness (generalized) 12/05/2020   Abnormality of gait and mobility 12/05/2020   Unsteadiness on feet 12/05/2020   Other abnormalities of gait and mobility 12/05/2020   Difficulty in walking, not elsewhere classified 12/05/2020   Other lack of coordination 12/05/2020   Type 2 diabetes mellitus with neurological complications ([place]) 40/97/3532   Mixed hyperlipidemia 11/27/2020   Primary hypertension 11/27/2020   Vitamin D deficiency 10/04/2020   Mediastinal adenopathy 10/04/2020   Hyponatremia    Transaminitis    Diabetes mellitus, new onset ([place])    Malnutrition of moderate degree 09/13/2020   Stroke ([place]) 09/12/2020   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

## 2020-12-10 NOTE — Therapy (Signed)
[place] MAIN [facility] SERVICES [address] [place], [place], [address] Phone: [phone]   Fax:  [phone]  Occupational Therapy Treatment  Patient Details  Name: Malik Jones MRN: [id number] Date of Birth: Sep 19, 1960 Referring Provider (OT): Dr. Leeroy Cha   Encounter Date: 12/10/2020   OT End of Session - 12/10/20 0921     Visit Number 18    Number of Visits 24    Date for OT Re-Evaluation 12/30/20    Authorization Time Period Reporting period starting 10/08/2020    OT Start Time 0845    OT Stop Time 0929    OT Time Calculation (min) 44 min    Activity Tolerance Patient tolerated treatment well    Behavior During Therapy [facility] for tasks assessed/performed             Past Medical History:  Diagnosis Date   Diabetes mellitus without complication ([place])    Hyperlipidemia    Hypertension    Hyponatremia    Medical history non-contributory    Stroke [facility])     Past Surgical History:  Procedure Laterality Date   MANDIBLE FRACTURE SURGERY     NO PAST SURGERIES      There were no vitals filed for this visit.   Subjective Assessment - 12/10/20 0920     Subjective  "It's getting better using my right hand when I eat."    Pertinent History cerebellar stroke    Limitations balance, R sided weakness and ataxia    Patient Stated Goals "I want to get back to normal."    Currently in Pain? No/denies    Pain Score 0-No pain    Multiple Pain Sites No           Occupational Therapy Treatment: Therapeutic Exercise: Pt participated in GMC/dynamic standing throwing/catching frisbee disc using RUE.  Pt able to maintain balance with wide BOS when stepping all directions.  Pt slowed reach to reduce fall risk when reaching to ground level.  Able to catch frisbee with R hand on occasion, but more consistently caught with Bues.  In sitting, pt picked up 1/2"-1" washers from dish and placed over vertical dowels to target small item  pick up and reaching toward a target.  Facilitated pinch strengthening with use of therapy resistant clothespins to target lateral and 3 point pinch of R hand.  Able to pinch all colors using R hand with mild ataxia noted when clipping pins onto vertical dowel with outstretched arm.  OT facilitated extended forward and lateral reaching to challenge reaching toward target with outstretched arm.    Response to Treatment: See Plan/clinical impression below.    OT Education - 12/10/20 0921     Education Details HEP progression    Person(s) Educated Patient    Methods Explanation;Demonstration;Verbal cues    Comprehension Verbalized understanding;Verbal cues required              OT Short Term Goals - 11/08/20 1618       OT SHORT TERM GOAL #1   Title Pt will be indep with HEP for RUE strength/coordination    Baseline Eval: HEP initiated at eval, will continue to add on; indep with HEP    Time 6    Period Weeks    Status Achieved    Target Date 11/18/20               OT Long Term Goals - 11/08/20 1618       OT LONG  TERM GOAL #1   Title Pt will increase FOTO score to 60 or better to indicate increased functional performance.    Baseline Eval: FOTO 52; 11/08/20: FOTO score 67    Time 12    Period Weeks    Status On-going    Target Date 12/30/20      OT LONG TERM GOAL #2   Title Pt will increase R grip strength to 80 lbs or better to improve carrying heavier ADL supplies with R dominant hand.    Baseline Eval: R grip 65 lbs (L non-dominant 76 lbs); 11/08/20: R grip 79 lbs    Time 12    Period Weeks    Status Partially Met    Target Date 12/30/20      OT LONG TERM GOAL #3   Title Pt will improve ability to pick up and manipulate small ADL supplies without dropping with R dominant hand as demonstrated by improved 9 hole peg test score to <30 sec or better.    Baseline Eval: R 9 hole peg test 56.3 sec (L non-dominant 20.6 sec); 11/08/2020: 9 hole peg test 48 sec    Time 12     Period Weeks    Status Partially Met    Target Date 12/30/20      OT LONG TERM GOAL #4   Title Pt will improve R GMC as demonstrated by ability to brush teeth, shave, eat with R dominant hand.    Baseline Eval: Pt using L non-dominant hand to brush teeth, shave, eat d/t moderate ataxia throughout RUE; 11/08/20: Pt still brushing teeth with L hand, son shaves him, but pt can eat finger foods with R hand.  Pt can self feed apple sauce with spoon in R hand with moderate ataxia, some spilling.    Time 12    Period Weeks    Status On-going    Target Date 12/30/20              Plan - 12/10/20 0950     Clinical Impression Statement Pt continues to develop improved accuracy and control when using RUE for reaching with outstretched arm, and when transporting items in R hand.  Pt tends to use L non-dominant hand to fill up and transport full water cup.  Pt reports that he is increasing use of R hand to eat, feels like it's getting easier with less shaking, and has even started to use RUE more consistently to eat when in public.  Pt required seated rest following ~20 min of standing to throw frisbee; able to maintain dynamic standing balance when reaching outside BOS in all directions using wide BOS and slow speed when reaching.  Pt reports that when he returns to work, he stands all day except for 30-45 min at lunch time.  OT advised pt start with 20 min daily walks, increase time as tolerated to build stamina for return to work.  Pt reports that he still struggles with cursive writing, and states that he needs to improve his signature before return to work as well.  Pt will continue to benefit from skilled OT to improve RUE coordination, overhead lifting/reaching, improving dynamic standing tolerance, and handwriting for return to work prep.    OT Occupational Profile and History Detailed Assessment- Review of Records and additional review of physical, cognitive, psychosocial history related to  current functional performance    Occupational performance deficits (Please refer to evaluation for details): ADL's;IADL's;Leisure    Body Structure / Function / Physical Skills  ADL;Dexterity;Strength;Balance;Coordination;FMC;IADL;Endurance;Sensation;UE functional use;GMC    Rehab Potential Excellent    Clinical Decision Making Several treatment options, min-mod task modification necessary    Comorbidities Affecting Occupational Performance: May have comorbidities impacting occupational performance    Modification or Assistance to Complete Evaluation  No modification of tasks or assist necessary to complete eval    OT Frequency 2x / week    OT Duration 12 weeks    OT Treatment/Interventions Self-care/ADL training;Therapeutic exercise;DME and/or AE instruction;Balance training;Neuromuscular education;Energy conservation;Therapeutic activities;Patient/family education;Coping strategies training    OT Home Exercise Plan theraputty increased to blue and instructed in exercises today    Consulted and Agree with Plan of Care Patient             Patient will benefit from skilled therapeutic intervention in order to improve the following deficits and impairments:   Body Structure / Function / Physical Skills: ADL, Dexterity, Strength, Balance, Coordination, FMC, IADL, Endurance, Sensation, UE functional use, GMC       Visit Diagnosis: Other lack of coordination  Muscle weakness (generalized)    Problem List Patient Active Problem List   Diagnosis Date Noted   Muscle weakness (generalized) 12/05/2020   Abnormality of gait and mobility 12/05/2020   Unsteadiness on feet 12/05/2020   Other abnormalities of gait and mobility 12/05/2020   Difficulty in walking, not elsewhere classified 12/05/2020   Other lack of coordination 12/05/2020   Type 2 diabetes mellitus with neurological complications ([place]) 55/37/4827   Mixed hyperlipidemia 11/27/2020   Primary hypertension 11/27/2020   Vitamin  D deficiency 10/04/2020   Mediastinal adenopathy 10/04/2020   Hyponatremia    Transaminitis    Diabetes mellitus, new onset ([place])    Malnutrition of moderate degree 09/13/2020   Stroke ([place]) 09/12/2020   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 [place], [place], [address] Phone: [phone]   Fax:  [phone]  Name: Malik Jones MRN: [id number] Date of Birth: 1960-03-10

## 2020-12-12 ENCOUNTER — Ambulatory Visit: Payer: BC Managed Care – PPO

## 2020-12-12 ENCOUNTER — Encounter: Payer: Self-pay | Admitting: Physical Therapy

## 2020-12-12 ENCOUNTER — Ambulatory Visit: Payer: BC Managed Care – PPO | Admitting: Physical Therapy

## 2020-12-12 ENCOUNTER — Other Ambulatory Visit: Payer: Self-pay

## 2020-12-12 DIAGNOSIS — R2681 Unsteadiness on feet: Secondary | ICD-10-CM | POA: Diagnosis not present

## 2020-12-12 DIAGNOSIS — I639 Cerebral infarction, unspecified: Secondary | ICD-10-CM

## 2020-12-12 DIAGNOSIS — R2689 Other abnormalities of gait and mobility: Secondary | ICD-10-CM | POA: Diagnosis not present

## 2020-12-12 DIAGNOSIS — M6281 Muscle weakness (generalized): Secondary | ICD-10-CM

## 2020-12-12 DIAGNOSIS — R262 Difficulty in walking, not elsewhere classified: Secondary | ICD-10-CM

## 2020-12-12 DIAGNOSIS — R278 Other lack of coordination: Secondary | ICD-10-CM | POA: Diagnosis not present

## 2020-12-12 DIAGNOSIS — R269 Unspecified abnormalities of gait and mobility: Secondary | ICD-10-CM

## 2020-12-12 NOTE — Therapy (Signed)
[place] MAIN [facility] SERVICES [address] [place], [place], [address] Phone: [phone]   Fax:  [phone]  Physical Therapy Treatment  Patient Details  Name: Malik Jones MRN: [id number] Date of Birth: [birth year] No data recorded  Encounter Date: 12/12/2020   PT End of Session - 12/12/20 0853     Visit Number 19    Number of Visits 24    Date for PT Re-Evaluation 12/31/20    Authorization - Visit Number 16    Authorization - Number of Visits 24    Progress Note Due on Visit 20    PT Start Time 0809    PT Stop Time 0845    PT Time Calculation (min) 36 min    Equipment Utilized During Treatment Gait belt    Activity Tolerance Patient tolerated treatment well    Behavior During Therapy WFL for tasks assessed/performed             Past Medical History:  Diagnosis Date   Diabetes mellitus without complication ([place])    Hyperlipidemia    Hypertension    Hyponatremia    Medical history non-contributory    Stroke [facility])     Past Surgical History:  Procedure Laterality Date   MANDIBLE FRACTURE SURGERY     NO PAST SURGERIES      There were no vitals filed for this visit.     Subjective Assessment - 12/12/20 0810     Subjective Reports no chages since last session. Denies pain. Is compliant with HEP. He reported continued difficulty with walking endurance.    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 Infarct occured on 09/11/20; no significant PMH. He attended inpatient rehab services at [facility] for 2 weeks and d/c from inpatient 10 days ago. He lived with his brother for 1 week before moving back into his own home. He is now living independently with abundant family check-ins. He is currently not driving or employed. He states his employer has had to post his job  during this time. Pt reports 1 fall immediately after stroke, otherwise no history of falls. Only PT he has recieved has been post-infarct. Pt arrivied with RW however states he rarely uses it and does nto use it in the home.    Limitations House hold activities;Walking;Lifting;Writing    How long can you sit comfortably? unaffected    How long can you stand comfortably? states standing is not a problem    How long can you walk comfortably? states walking is not a problem    Patient Stated Goals return to work and driving, be fully independent as he was before cerebellar infarct    Currently in Pain? No/denies            INTERVENTIONS   Neuro Re- Ed:    Side stepping 61f, no resistance, x3 each direction. VC to increase stepping cadence. More difficulty leading with RLE due to coordination and lag time of RLE while trunk continues to shift forward outside BOS.   Lateral shuffle, 270f 5x each direction. Increased difficulty leading with RLE. 1 stumble during rightward shuffle due to lag time of RLE with LLE progressing at a faster pace.    [place] (carioca), 209f6x each direction. Progressively improved crossover stepping and LE coordination. Increased difficulty leading with RLE, VC for increased crossover.   Lateral hop, BUE  support on bar - coordinating movement very difficult for pt. He did successfully perform 4 reps to the left. Attempted 12 in each direction, appeared more like a shuffle with a small lateral hop to finish.  Fast feet, BUE support at bar - 3 x 10 seconds. Progressively increased speed each set - as speed increased, RLE began to lag as LLE maintained speed.      There Act:   TRX squat 2x10 (warm up), VC on technique and muscle recruitment.   TRX squat jump 2 x 10  -To improve muscular power and ability to transition with variable positions -Cues for proper foot placement, equal landing time, equal weight placement, eccentric muscular control.   TRX  reverse lunge, 2 x 10 BLE -cues for step length, foot placement, weight shifting forward, balance.         Next session ideas: Ladder drills  Forward - - 1 foot in each - 2 feet in each - "in-in-out-out" Lateral - - 2 feet in each - "in-in-out-out" zipper          Clinical Impression: Pt demonstrates excellent motivation throughout session, agreeable and determined to master new exercises. TRX exercises were continued for improved muscle recruitment and firing patterns. Side stepping was progressed into a lateral shuffle - pt performed well, deficits in coordination, rhythm and stability noted when leading with RLE. Significant difficulty completing lateral hopping - will continue to practice. Performance significantly improved as more reps were completed however pt would benefit from continued practice due to difficulty with consistency, coordination and stability.  Pt will continue to benefit from skilled PT intervention in order to improve his LE strength, endurance, balance, coordination and to restore his PLOF.        PT Short Term Goals - 11/08/20 6979       PT SHORT TERM GOAL #1   Title Patient will be independent in home exercise program to improve strength/mobility for better functional independence with ADLs.    Time 6    Period Weeks    Status Achieved    Target Date 11/19/20               PT Long Term Goals - 11/11/20 1003       PT LONG TERM GOAL #1   Title Patient will increase FOTO score to equal to or greater than 77 to demonstrate statistically significant improvement in mobility and quality of life.    Baseline 10/08/20: 63, 9/30: 67    Time 12    Period Weeks    Status On-going    Target Date 12/31/20      PT LONG TERM GOAL #2   Title Pt will improve BERG by at least 3 points in order to demonstrate clinically significant improvement in balance.    Baseline 42 10/11/20    Time 8    Period Weeks    Status Achieved      PT LONG TERM GOAL #3    Title Patient will reduce timed up and go to <11 seconds to reduce fall risk and demonstrate improved transfer/gait ability.    Baseline 10/08/20: 12.68 seconds;10.3 sec    Time 12    Period Weeks    Status Achieved      PT LONG TERM GOAL #4   Title Patient will increase six minute walk test distance to >1000 for progression to community ambulator and improve gait ability    Baseline 10/08/20: 944f    Time 12  Period Weeks    Status Partially Met    Target Date 12/31/20      PT LONG TERM GOAL #5   Title Patient will increase Functional Gait Assessment score to >23/30 as to reduce fall risk and improve dynamic gait safety with community ambulation.    Baseline 10/15/20: 14/30. 20/30 9/30, revised goal this session    Time 12    Period Weeks    Status Revised    Target Date 12/31/20      PT LONG TERM GOAL #6   Title Pt will improve score on mini BEST test by 4 points or greater in order to indicate improved balance and decreased risk of falls.    Baseline to be assessed visit 11 10/3: 20/28    Time 7    Period Weeks    Status New    Target Date 12/27/20                   Plan - 12/12/20 0853     Clinical Impression Statement Pt demonstrates excellent motivation throughout session, agreeable and determined to master new exercises. TRX exercises were continued for improved muscle recruitment and firing patterns. Side stepping was progressed into a lateral shuffle - pt performed well, deficits in coordination, rhythm and stability noted when leading with RLE. Significant difficulty completing lateral hopping - will continue to practice. Performance significantly improved as more reps were completed however pt would benefit from continued practice due to difficulty with consistency, coordination and stability.  Pt will continue to benefit from skilled PT intervention in order to improve his LE strength, endurance, balance, coordination and to restore his PLOF.    Personal Factors  and Comorbidities Age;Comorbidity 1;Comorbidity 2    Comorbidities HTN , DM    Examination-Activity Limitations Locomotion Level;Transfers;Stairs    Examination-Participation Restrictions Driving;Occupation;Community Activity;Yard Work    Stability/Clinical Decision Making Stable/Uncomplicated    Rehab Potential Excellent    PT Frequency 2x / week    PT Duration 12 weeks    PT Treatment/Interventions ADLs/Self Care Home Management;Aquatic Therapy;Biofeedback;Cryotherapy;Electrical Stimulation;Moist Heat;Traction;Ultrasound;Gait training;DME Instruction;Stair training;Functional mobility training;Therapeutic activities;Neuromuscular re-education;Balance training;Therapeutic exercise;Patient/family education;Manual techniques    PT Next Visit Plan begin dynamc gait training activity, continue with LE strengthening program    PT Home Exercise Plan Provided Access Code: [email or web address]  URL: [email or web address]  Date: 10/11/2020  Prepared by: Rivka Barbara    Consulted and Agree with Plan of Care Patient             Patient will benefit from skilled therapeutic intervention in order to improve the following deficits and impairments:  Abnormal gait, Decreased activity tolerance, Decreased endurance, Impaired sensation, Improper body mechanics, Decreased balance, Decreased coordination, Decreased mobility, Decreased safety awareness, Difficulty walking  Visit Diagnosis: Abnormality of gait and mobility  Other lack of coordination  Difficulty in walking, not elsewhere classified  Unsteadiness on feet  Muscle weakness (generalized)  Other abnormalities of gait and mobility     Problem List Patient Active Problem List   Diagnosis Date Noted   Muscle weakness (generalized) 12/05/2020   Abnormality of gait and mobility 12/05/2020   Unsteadiness on feet 12/05/2020   Other abnormalities of gait and mobility 12/05/2020   Difficulty in walking, not elsewhere classified  12/05/2020   Other lack of coordination 12/05/2020   Type 2 diabetes mellitus with neurological complications ([place]) 56/43/3295   Mixed hyperlipidemia 11/27/2020   Primary hypertension 11/27/2020   Vitamin D deficiency 10/04/2020   Mediastinal adenopathy 10/04/2020  Hyponatremia    Transaminitis    Diabetes mellitus, new onset ([place])    Malnutrition of moderate degree 09/13/2020   Stroke ([place]) 09/12/2020   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

## 2020-12-12 NOTE — Therapy (Signed)
[place] MAIN [facility] SERVICES [address] [place], [place], [address] Phone: [phone]   Fax:  [phone]  Occupational Therapy Treatment  Patient Details  Name: Malik Jones MRN: [id number] Date of Birth: [birth year] Referring Provider (OT): Dr. Leeroy Cha   Encounter Date: 12/12/2020   OT End of Session - 12/12/20 0856     Visit Number 19    Number of Visits 24    Date for OT Re-Evaluation 12/30/20    Authorization Time Period Reporting period starting 10/08/2020    OT Start Time 0847    OT Stop Time 0927    OT Time Calculation (min) 40 min    Activity Tolerance Patient tolerated treatment well    Behavior During Therapy [facility] for tasks assessed/performed             Past Medical History:  Diagnosis Date   Diabetes mellitus without complication ([place])    Hyperlipidemia    Hypertension    Hyponatremia    Medical history non-contributory    Stroke [facility])     Past Surgical History:  Procedure Laterality Date   MANDIBLE FRACTURE SURGERY     NO PAST SURGERIES      There were no vitals filed for this visit.   Subjective Assessment - 12/12/20 0848     Subjective  "I go back to Dr. Neomia Dear on the 14th."    Pertinent History cerebellar stroke    Limitations balance, R sided weakness and ataxia    Patient Stated Goals "I want to get back to normal."    Currently in Pain? No/denies    Pain Score 0-No pain            Occupational Therapy Treatment: Self Care: Participation in handwriting practice with cursive signature, tracing, and drawing diagonal/vertical/horizontal/circular patterns.  Used lined paper to use visual cue for letter height and level writing.    Neuro re-ed: Pt participated in GMC/dynamic standing throwing/catching frisbee disc using RUE.  Pt able to maintain balance with wide BOS when stepping all directions 90% of the time.  1 small LOB with backward step but recovered with wide BOS and extra step.   Pt slowed reach to reduce fall risk when reaching to ground level.  Able to catch frisbee with R hand on occasion, but more consistently caught with Bues.   Response to Treatment: See Plan/clinical impression below.    OT Education - 12/12/20 0856     Education Details HEP progression, handwriting practice    Person(s) Educated Patient    Methods Explanation;Demonstration;Verbal cues    Comprehension Verbalized understanding;Verbal cues required              OT Short Term Goals - 11/08/20 1618       OT SHORT TERM GOAL #1   Title Pt will be indep with HEP for RUE strength/coordination    Baseline Eval: HEP initiated at eval, will continue to add on; indep with HEP    Time 6    Period Weeks    Status Achieved    Target Date 11/18/20               OT Long Term Goals - 11/08/20 1618       OT LONG TERM GOAL #1   Title Pt will increase FOTO score to 60 or better to indicate increased functional performance.    Baseline Eval: FOTO 52; 11/08/20: FOTO score 67    Time 12    Period  Weeks    Status On-going    Target Date 12/30/20      OT LONG TERM GOAL #2   Title Pt will increase R grip strength to 80 lbs or better to improve carrying heavier ADL supplies with R dominant hand.    Baseline Eval: R grip 65 lbs (L non-dominant 76 lbs); 11/08/20: R grip 79 lbs    Time 12    Period Weeks    Status Partially Met    Target Date 12/30/20      OT LONG TERM GOAL #3   Title Pt will improve ability to pick up and manipulate small ADL supplies without dropping with R dominant hand as demonstrated by improved 9 hole peg test score to <30 sec or better.    Baseline Eval: R 9 hole peg test 56.3 sec (L non-dominant 20.6 sec); 11/08/2020: 9 hole peg test 48 sec    Time 12    Period Weeks    Status Partially Met    Target Date 12/30/20      OT LONG TERM GOAL #4   Title Pt will improve R GMC as demonstrated by ability to brush teeth, shave, eat with R dominant hand.    Baseline Eval:  Pt using L non-dominant hand to brush teeth, shave, eat d/t moderate ataxia throughout RUE; 11/08/20: Pt still brushing teeth with L hand, son shaves him, but pt can eat finger foods with R hand.  Pt can self feed apple sauce with spoon in R hand with moderate ataxia, some spilling.    Time 12    Period Weeks    Status On-going    Target Date 12/30/20              Plan - 12/12/20 0942     Clinical Impression Statement Pt continues to develop legibility and speed with handwriting.  Pt also developing dynamic standing balance, standing tolerance, and GMC in prep for eventual return to work tasks.  Pt fatigues with 10-15 min of dynamic standing activity and requires seated rest to recover.  Pt uses wide BOS for dynamic standing activities and slow speed to reach for items outside BOS to reduce fall risk.    OT Occupational Profile and History Detailed Assessment- Review of Records and additional review of physical, cognitive, psychosocial history related to current functional performance    Occupational performance deficits (Please refer to evaluation for details): ADL's;IADL's;Leisure    Body Structure / Function / Physical Skills ADL;Dexterity;Strength;Balance;Coordination;FMC;IADL;Endurance;Sensation;UE functional use;GMC    Rehab Potential Excellent    Clinical Decision Making Several treatment options, min-mod task modification necessary    Comorbidities Affecting Occupational Performance: May have comorbidities impacting occupational performance    Modification or Assistance to Complete Evaluation  No modification of tasks or assist necessary to complete eval    OT Frequency 2x / week    OT Duration 12 weeks    OT Treatment/Interventions Self-care/ADL training;Therapeutic exercise;DME and/or AE instruction;Balance training;Neuromuscular education;Energy conservation;Therapeutic activities;Patient/family education;Coping strategies training    OT Home Exercise Plan theraputty increased to  blue and instructed in exercises today    Consulted and Agree with Plan of Care Patient             Patient will benefit from skilled therapeutic intervention in order to improve the following deficits and impairments:   Body Structure / Function / Physical Skills: ADL, Dexterity, Strength, Balance, Coordination, FMC, IADL, Endurance, Sensation, UE functional use, GMC       Visit Diagnosis: Muscle  weakness (generalized)  Other lack of coordination  Cerebellar stroke [facility])    Problem List Patient Active Problem List   Diagnosis Date Noted   Muscle weakness (generalized) 12/05/2020   Abnormality of gait and mobility 12/05/2020   Unsteadiness on feet 12/05/2020   Other abnormalities of gait and mobility 12/05/2020   Difficulty in walking, not elsewhere classified 12/05/2020   Other lack of coordination 12/05/2020   Type 2 diabetes mellitus with neurological complications ([place]) 22/44/9753   Mixed hyperlipidemia 11/27/2020   Primary hypertension 11/27/2020   Vitamin D deficiency 10/04/2020   Mediastinal adenopathy 10/04/2020   Hyponatremia    Transaminitis    Diabetes mellitus, new onset ([place])    Malnutrition of moderate degree 09/13/2020   Stroke ([place]) 09/12/2020   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 [place], [place], [address] Phone: [phone]   Fax:  [phone]  Name: Malik Jones MRN: [id number] Date of Birth: 13-Sep-1960

## 2020-12-17 ENCOUNTER — Ambulatory Visit: Payer: BC Managed Care – PPO | Admitting: Physical Therapy

## 2020-12-17 ENCOUNTER — Encounter: Payer: Self-pay | Admitting: Physical Therapy

## 2020-12-17 ENCOUNTER — Ambulatory Visit: Payer: BC Managed Care – PPO

## 2020-12-17 ENCOUNTER — Other Ambulatory Visit: Payer: Self-pay

## 2020-12-17 DIAGNOSIS — R262 Difficulty in walking, not elsewhere classified: Secondary | ICD-10-CM | POA: Diagnosis not present

## 2020-12-17 DIAGNOSIS — R278 Other lack of coordination: Secondary | ICD-10-CM

## 2020-12-17 DIAGNOSIS — R269 Unspecified abnormalities of gait and mobility: Secondary | ICD-10-CM

## 2020-12-17 DIAGNOSIS — R2689 Other abnormalities of gait and mobility: Secondary | ICD-10-CM | POA: Diagnosis not present

## 2020-12-17 DIAGNOSIS — I639 Cerebral infarction, unspecified: Secondary | ICD-10-CM | POA: Diagnosis not present

## 2020-12-17 DIAGNOSIS — M6281 Muscle weakness (generalized): Secondary | ICD-10-CM

## 2020-12-17 DIAGNOSIS — R2681 Unsteadiness on feet: Secondary | ICD-10-CM

## 2020-12-17 NOTE — Therapy (Signed)
[place] MAIN [facility] SERVICES [address] [place], [place], [address] Phone: [phone]   Fax:  [phone]  Occupational Therapy Treatment/Progress Note Reporting period 11/11/20-12/17/2020  Patient Details  Name: Malik Jones MRN: [id number] Date of Birth: 04-01-1960 Referring Provider (OT): Dr. Leeroy Cha   Encounter Date: 12/17/2020   OT End of Session - 12/17/20 0928     Visit Number 20    Number of Visits 24    Date for OT Re-Evaluation 12/30/20    Authorization Time Period Reporting period starting 11/11/2020    OT Start Time 0856    OT Stop Time 0940    OT Time Calculation (min) 44 min    Activity Tolerance Patient tolerated treatment well    Behavior During Therapy [facility] for tasks assessed/performed             Past Medical History:  Diagnosis Date   Diabetes mellitus without complication ([place])    Hyperlipidemia    Hypertension    Hyponatremia    Medical history non-contributory    Stroke [facility])     Past Surgical History:  Procedure Laterality Date   MANDIBLE FRACTURE SURGERY     NO PAST SURGERIES      There were no vitals filed for this visit.   Subjective Assessment - 12/17/20 0922     Subjective  "I started using my R hand to shave my head."    Pertinent History cerebellar stroke    Limitations balance, R sided weakness and ataxia    Patient Stated Goals "I want to get back to normal."    Currently in Pain? No/denies    Pain Score 0-No pain    Multiple Pain Sites No            Occupational Therapy Treatment/Progress Note: Self Care: Practiced use of R hand to hold and transport cup of water 1/2 full down hallway, sit to stand with cup in hand and transport back to table.  2 trials completed, 2 small spills first trial, 1 small spill second trial.  Pt reports no hand fatigue only "shaking" from ataxia.   Therapeutic Exercise: Facilitated hand strengthening with use of hand gripper set at 23.4#  (1st trial), 28.9 # (2nd and 3rd trials) to place/remove jumbo pegs from pegboard x3 trials using R hand.  [facility] addressed with assembly of [employer] pegboard pieces using R hand.  Pt with mild ataxia, and having to slide flat pieces to edge of board when dropped for easier pick up.  Attempted picking up flat washers from table top, but pt required non skid grip mat for successful pick up.   Response to Treatment: R hand strength not showing gains from last reassessment as tx focus has been on FMC/GMC to minimize ataxia when engaging RUE into self care.  OT encouraged pt to re-focus on blue theraputty for grip and pinch strengthening.  Pt is steadily improving with FMC/GMC skills as noted by increased use of R hand for self feeding with fairly good accuracy.  Pt is brushing teeth with R hand with a little difficulty, and shaving head with R hand with extra time.  [place] improvement noted by 9 hole peg test improvement of 13 seconds.  Pt will continue to benefit from skilled OT to address RUE weakness, ataxia, and progress standing balance and standing tolerance necessary for return to work.     [facility] OT Assessment - 12/17/20 0001       Observation/Other Assessments  Focus on Therapeutic Outcomes (FOTO)  61      Coordination   Right 9 Hole Peg Test 35 secs   dominant/affected side   Left 9 Hole Peg Test 20 sec      Strength   Overall Strength Comments BUEs 5/5      Hand Function   Right Hand Grip (lbs) 79    Right Hand Lateral Pinch 21 lbs    Right Hand 3 Point Pinch 15 lbs    Left Hand Grip (lbs) 93    Left Hand Lateral Pinch 22 lbs    Left 3 point pinch 19 lbs                OT Education - 12/17/20 0924     Education Details HEP progression, return to focus on grip and pinch strengthening with putty at home    Person(s) Educated Patient    Methods Explanation;Demonstration;Verbal cues    Comprehension Verbalized understanding;Verbal cues required              OT Short Term  Goals - 12/17/20 0947       OT SHORT TERM GOAL #1   Title Pt will be indep with HEP for RUE strength/coordination    Baseline Eval: HEP initiated at eval, will continue to add on; indep with HEP    Time 6    Period Weeks    Status Achieved    Target Date 11/18/20               OT Long Term Goals - 12/17/20 0948       OT LONG TERM GOAL #1   Title Pt will increase FOTO score to 60 or better to indicate increased functional performance.    Baseline Eval: FOTO 52; 11/08/20: FOTO score 67; 12/17/20: FOTO 61    Time 12    Period Weeks    Status On-going    Target Date 12/30/20      OT LONG TERM GOAL #2   Title Pt will increase R grip strength to 80 lbs or better to improve carrying heavier ADL supplies with R dominant hand.    Baseline Eval: R grip 65 lbs (L non-dominant 76 lbs); 11/08/20: R grip 79 lbs; 12/17/20: R grip 79 lbs (L 93 lbs)    Time 12    Period Weeks    Status Partially Met    Target Date 12/30/20      OT LONG TERM GOAL #3   Title Pt will improve ability to pick up and manipulate small ADL supplies without dropping with R dominant hand as demonstrated by improved 9 hole peg test score to <30 sec or better.    Baseline Eval: R 9 hole peg test 56.3 sec (L non-dominant 20.6 sec); 11/08/2020: 9 hole peg test 48 sec; 12/17/20: R 9 hole 35 secs (L 20 secs)    Time 12    Period Weeks    Status Partially Met    Target Date 12/30/20      OT LONG TERM GOAL #4   Title Pt will improve R GMC as demonstrated by ability to brush teeth, shave, eat with R dominant hand.    Baseline Eval: Pt using L non-dominant hand to brush teeth, shave, eat d/t moderate ataxia throughout RUE; 11/08/20: Pt still brushing teeth with L hand, son shaves him, but pt can eat finger foods with R hand.  Pt can self feed apple sauce with spoon in R hand with moderate ataxia,  some spilling;; 12/17/20: Pt is now brushing teeth with some difficulty using R hand, pt shaves head with R hand, not yet shaving face  (uses L hand), and eating with fair accuracy with R hand 75% of the time    Time 12    Period Weeks    Status On-going    Target Date 12/30/20      OT LONG TERM GOAL #5   Title Pt will tolerate at least 25 min of dynamic standing activity without rest break to build activity tolerance for return to work.    Baseline 12/17/20: Pt tolerates 15 min before rest break needed    Time 6    Period Weeks    Status New    Target Date 01/28/21                   Plan - 12/17/20 1011     Clinical Impression Statement R hand strength not showing gains from last reassessment as tx focus has been on FMC/GMC to minimize ataxia when engaging RUE into self care.  OT encouraged pt to re-focus on blue theraputty for grip and pinch strengthening.  Pt is steadily improving with FMC/GMC skills as noted by increased use of R hand for self feeding with fairly good accuracy.  Pt is brushing teeth with R hand with a little difficulty, and shaving head with R hand with extra time.  [place] improvement noted by 9 hole peg test improvement of 13 seconds.  Pt will continue to benefit from skilled OT to address RUE weakness, ataxia, and progress standing balance and standing tolerance necessary for return to work.    OT Occupational Profile and History Detailed Assessment- Review of Records and additional review of physical, cognitive, psychosocial history related to current functional performance    Occupational performance deficits (Please refer to evaluation for details): ADL's;IADL's;Leisure    Body Structure / Function / Physical Skills ADL;Dexterity;Strength;Balance;Coordination;FMC;IADL;Endurance;Sensation;UE functional use;GMC    Rehab Potential Excellent    Clinical Decision Making Several treatment options, min-mod task modification necessary    Comorbidities Affecting Occupational Performance: May have comorbidities impacting occupational performance    Modification or Assistance to Complete Evaluation  No  modification of tasks or assist necessary to complete eval    OT Frequency 2x / week    OT Duration 12 weeks    OT Treatment/Interventions Self-care/ADL training;Therapeutic exercise;DME and/or AE instruction;Balance training;Neuromuscular education;Energy conservation;Therapeutic activities;Patient/family education;Coping strategies training    OT Home Exercise Plan theraputty increased to blue and instructed in exercises today    Consulted and Agree with Plan of Care Patient             Patient will benefit from skilled therapeutic intervention in order to improve the following deficits and impairments:   Body Structure / Function / Physical Skills: ADL, Dexterity, Strength, Balance, Coordination, FMC, IADL, Endurance, Sensation, UE functional use, GMC       Visit Diagnosis: Muscle weakness (generalized)  Other lack of coordination  Cerebellar stroke [facility])    Problem List Patient Active Problem List   Diagnosis Date Noted   Muscle weakness (generalized) 12/05/2020   Abnormality of gait and mobility 12/05/2020   Unsteadiness on feet 12/05/2020   Other abnormalities of gait and mobility 12/05/2020   Difficulty in walking, not elsewhere classified 12/05/2020   Other lack of coordination 12/05/2020   Type 2 diabetes mellitus with neurological complications ([place]) 00/93/8182   Mixed hyperlipidemia 11/27/2020   Primary hypertension 11/27/2020   Vitamin D  deficiency 10/04/2020   Mediastinal adenopathy 10/04/2020   Hyponatremia    Transaminitis    Diabetes mellitus, new onset ([place])    Malnutrition of moderate degree 09/13/2020   Stroke ([place]) 09/12/2020   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

## 2020-12-17 NOTE — Therapy (Signed)
[place] MAIN [facility] SERVICES [address] [place], [place], [address] Phone: [phone]   Fax:  [phone]  Physical Therapy Treatment/Physical Therapy Progress Note  Dates of Reporting Period: 11/08/20-12/17/20   Patient Details  Name: Malik Jones MRN: [id number] Date of Birth: 05-Apr-1960 No data recorded  Encounter Date: 12/17/2020   PT End of Session - 12/17/20 1012     Visit Number 20    Number of Visits 24    Date for PT Re-Evaluation 12/31/20    Authorization - Visit Number 16    Authorization - Number of Visits 24    Progress Note Due on Visit 30    PT Start Time 0813    PT Stop Time 1749    PT Time Calculation (min) 34 min    Equipment Utilized During Treatment Gait belt    Activity Tolerance Patient tolerated treatment well    Behavior During Therapy WFL for tasks assessed/performed             Past Medical History:  Diagnosis Date   Diabetes mellitus without complication ([place])    Hyperlipidemia    Hypertension    Hyponatremia    Medical history non-contributory    Stroke [facility])     Past Surgical History:  Procedure Laterality Date   MANDIBLE FRACTURE SURGERY     NO PAST SURGERIES      There were no vitals filed for this visit.   Subjective Assessment - 12/17/20 0816     Subjective Reports no chages since last session. Denies pain. Is compliant with HEP. He states he has been walking to the store which is about a mile from his house.    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 Infarct occured on 09/11/20; no significant PMH. He attended inpatient rehab services at [facility] for 2 weeks and d/c from inpatient 10 days ago. He lived with his brother for 1 week before moving back into his own home. He is now living independently with abundant family  check-ins. He is currently not driving or employed. He states his employer has had to post his job during this time. Pt reports 1 fall immediately after stroke, otherwise no history of falls. Only PT he has recieved has been post-infarct. Pt arrivied with RW however states he rarely uses it and does nto use it in the home.    Limitations House hold activities;Walking;Lifting;Writing    How long can you sit comfortably? unaffected    How long can you stand comfortably? states standing is not a problem    How long can you walk comfortably? states walking is not a problem    Patient Stated Goals return to work and driving, be fully independent as he was before cerebellar infarct    Currently in Pain? No/denies              INTERVENTIONS    PT guided pt through outcome measures during today's progress note.   FOTO: 66 FGA: 24/30 6 MWT: 1372f Mini Best Test: 23/28        Clinical Impression: Pt demonstrates excellent motivation throughout today's session as outcome measures were assessed for today's progress note. Pt shows significant progress towards goals, meeting multiple goals with improved scores in the FGA, mini-best test and 6MWT. Subjectively, pt scores his physical abilities about the same as previous goal assessment.  Pt will continue to benefit from skilled PT intervention in order to improve his LE strength, endurance, balance, coordination and to restore his PLOF.  Patient's condition has the potential to improve in response to therapy. Maximum improvement is yet to be obtained. The anticipated improvement is attainable and reasonable in a generally predictable time.  Patient reports             PT Short Term Goals - 11/08/20 0808       PT SHORT TERM GOAL #1   Title Patient will be independent in home exercise program to improve strength/mobility for better functional independence with ADLs.    Time 6    Period Weeks    Status Achieved    Target Date 11/19/20                PT Long Term Goals - 11/11/20 1003       PT LONG TERM GOAL #1   Title Patient will increase FOTO score to equal to or greater than 77 to demonstrate statistically significant improvement in mobility and quality of life.    Baseline 10/08/20: 63, 9/30: 67    Time 12    Period Weeks    Status On-going    Target Date 12/31/20      PT LONG TERM GOAL #2   Title Pt will improve BERG by at least 3 points in order to demonstrate clinically significant improvement in balance.    Baseline 42 10/11/20    Time 8    Period Weeks    Status Achieved      PT LONG TERM GOAL #3   Title Patient will reduce timed up and go to <11 seconds to reduce fall risk and demonstrate improved transfer/gait ability.    Baseline 10/08/20: 12.68 seconds;10.3 sec    Time 12    Period Weeks    Status Achieved      PT LONG TERM GOAL #4   Title Patient will increase six minute walk test distance to >1000 for progression to community ambulator and improve gait ability    Baseline 10/08/20: 970f    Time 12    Period Weeks    Status Partially Met    Target Date 12/31/20      PT LONG TERM GOAL #5   Title Patient will increase Functional Gait Assessment score to >23/30 as to reduce fall risk and improve dynamic gait safety with community ambulation.    Baseline 10/15/20: 14/30. 20/30 9/30, revised goal this session    Time 12    Period Weeks    Status Revised    Target Date 12/31/20      PT LONG TERM GOAL #6   Title Pt will improve score on mini BEST test by 4 points or greater in order to indicate improved balance and decreased risk of falls.    Baseline to be assessed visit 11 10/3: 20/28    Time 7    Period Weeks    Status New    Target Date 12/27/20                   Plan - 12/17/20 1012     Clinical Impression Statement Pt demonstrates excellent motivation throughout today's session as outcome measures were assessed for today's progress note. Pt shows significant progress  towards goals, meeting multiple goals with improved scores in the FGA, mini-best test and 6MWT. Subjectively, pt scores his physical abilities about the same as previous goal assessment. Pt  will continue to benefit from skilled PT intervention in order to improve his LE strength, endurance, balance, coordination and to restore his PLOF.    Personal Factors and Comorbidities Age;Comorbidity 1;Comorbidity 2    Comorbidities HTN , DM    Examination-Activity Limitations Locomotion Level;Transfers;Stairs    Examination-Participation Restrictions Driving;Occupation;Community Activity;Yard Work    Stability/Clinical Decision Making Stable/Uncomplicated    Rehab Potential Excellent    PT Frequency 2x / week    PT Duration 12 weeks    PT Treatment/Interventions ADLs/Self Care Home Management;Aquatic Therapy;Biofeedback;Cryotherapy;Electrical Stimulation;Moist Heat;Traction;Ultrasound;Gait training;DME Instruction;Stair training;Functional mobility training;Therapeutic activities;Neuromuscular re-education;Balance training;Therapeutic exercise;Patient/family education;Manual techniques    PT Next Visit Plan begin dynamc gait training activity, continue with LE strengthening program    PT Home Exercise Plan Provided Access Code: [email or web address]  URL: [email or web address]  Date: 10/11/2020  Prepared by: Rivka Barbara    Consulted and Agree with Plan of Care Patient             Patient will benefit from skilled therapeutic intervention in order to improve the following deficits and impairments:  Abnormal gait, Decreased activity tolerance, Decreased endurance, Impaired sensation, Improper body mechanics, Decreased balance, Decreased coordination, Decreased mobility, Decreased safety awareness, Difficulty walking  Visit Diagnosis: Abnormality of gait and mobility  Other abnormalities of gait and mobility  Difficulty in walking, not elsewhere classified  Other lack of coordination  Muscle  weakness (generalized)  Unsteadiness on feet     Problem List Patient Active Problem List   Diagnosis Date Noted   Muscle weakness (generalized) 12/05/2020   Abnormality of gait and mobility 12/05/2020   Unsteadiness on feet 12/05/2020   Other abnormalities of gait and mobility 12/05/2020   Difficulty in walking, not elsewhere classified 12/05/2020   Other lack of coordination 12/05/2020   Type 2 diabetes mellitus with neurological complications ([place]) 50/93/2671   Mixed hyperlipidemia 11/27/2020   Primary hypertension 11/27/2020   Vitamin D deficiency 10/04/2020   Mediastinal adenopathy 10/04/2020   Hyponatremia    Transaminitis    Diabetes mellitus, new onset ([place])    Malnutrition of moderate degree 09/13/2020   Stroke ([place]) 09/12/2020   Aortic thrombus ([place])    Hyperlipidemia    Essential hypertension    Impaired fasting glucose    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 [place], [place], [address] Phone: [phone]   Fax:  [phone]  Name: Malik Jones MRN: [id number] Date of Birth: February 03, 1961

## 2020-12-19 ENCOUNTER — Encounter: Payer: Self-pay | Admitting: Physical Therapy

## 2020-12-19 ENCOUNTER — Ambulatory Visit: Payer: BC Managed Care – PPO

## 2020-12-19 ENCOUNTER — Ambulatory Visit: Payer: BC Managed Care – PPO | Admitting: Physical Therapy

## 2020-12-19 ENCOUNTER — Other Ambulatory Visit: Payer: Self-pay

## 2020-12-19 DIAGNOSIS — R278 Other lack of coordination: Secondary | ICD-10-CM | POA: Diagnosis not present

## 2020-12-19 DIAGNOSIS — M6281 Muscle weakness (generalized): Secondary | ICD-10-CM | POA: Diagnosis not present

## 2020-12-19 DIAGNOSIS — R2689 Other abnormalities of gait and mobility: Secondary | ICD-10-CM | POA: Diagnosis not present

## 2020-12-19 DIAGNOSIS — R262 Difficulty in walking, not elsewhere classified: Secondary | ICD-10-CM

## 2020-12-19 DIAGNOSIS — R2681 Unsteadiness on feet: Secondary | ICD-10-CM | POA: Diagnosis not present

## 2020-12-19 DIAGNOSIS — R269 Unspecified abnormalities of gait and mobility: Secondary | ICD-10-CM

## 2020-12-19 DIAGNOSIS — I639 Cerebral infarction, unspecified: Secondary | ICD-10-CM | POA: Diagnosis not present

## 2020-12-19 NOTE — Therapy (Signed)
[place] MAIN [facility] SERVICES [address] [place], [place], [address] Phone: [phone]   Fax:  [phone]  Physical Therapy Treatment  Patient Details  Name: Malik Jones MRN: [id number] Date of Birth: 11/17/1960 No data recorded  Encounter Date: 12/19/2020   PT End of Session - 12/19/20 0830     Visit Number 21    Number of Visits 24    Date for PT Re-Evaluation 12/31/20    Authorization - Visit Number 16    Authorization - Number of Visits 24    Progress Note Due on Visit 2    PT Start Time 0817    PT Stop Time 0845    PT Time Calculation (min) 28 min    Equipment Utilized During Treatment Gait belt    Activity Tolerance Patient tolerated treatment well    Behavior During Therapy WFL for tasks assessed/performed             Past Medical History:  Diagnosis Date   Diabetes mellitus without complication ([place])    Hyperlipidemia    Hypertension    Hyponatremia    Medical history non-contributory    Stroke [facility])     Past Surgical History:  Procedure Laterality Date   MANDIBLE FRACTURE SURGERY     NO PAST SURGERIES      There were no vitals filed for this visit.   Subjective Assessment - 12/19/20 0819     Subjective Reports no chages since last session. Denies pain. States he washed the car and walked to the store yesterday.    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 Infarct occured on 09/11/20; no significant PMH. He attended inpatient rehab services at [facility] for 2 weeks and d/c from inpatient 10 days ago. He lived with his brother for 1 week before moving back into his own home. He is now living independently with abundant family check-ins. He is currently not driving or employed. He states his employer has had to post his job during this time. Pt reports  1 fall immediately after stroke, otherwise no history of falls. Only PT he has recieved has been post-infarct. Pt arrivied with RW however states he rarely uses it and does nto use it in the home.    Limitations House hold activities;Walking;Lifting;Writing    How long can you sit comfortably? unaffected    How long can you stand comfortably? states standing is not a problem    How long can you walk comfortably? states walking is not a problem    Patient Stated Goals return to work and driving, be fully independent as he was before cerebellar infarct    Currently in Pain? No/denies               INTERVENTIONS  Neuro Re- Ed:  CGA provided at all times using gait belt.   Side stepping 27f, 17.5# using Matrix cable machine, x3 each direction.    Lateral shuffle, 254f 5x each direction. Increased difficulty leading with RLE. 1 stumble during rightward shuffle due to lag time of RLE with LLE progressing at a faster pace. VC for increased cadence final 3 reps.   Grapevine (carioca), 2059f7x each direction. First 3 reps performed at pt self-selected pace. Final 4 reps performed with PT challenging increased cadence.    Squat jump 3x10 with mirror for visual feedback on  equal landing time, weight placement, eccentric muscular control.  Forward hops (<12 inches per hop) with mirror for visual feedback on equal leaving and landing time as well as weight placement. Pt had tendency to lead with RLE - frequent VC for BLE to leave ground together. 10 hops x 5 sets.   Fast feet, 4 x 15 seconds. PT performing with pt for visual and audial cueing/pacing to assist pt with coordination. Progressively increased speed - as speed increased, RLE began to lag as LLE maintained speed.   Fast feet forward and backward, 3 x 20 seconds (10 sec forward/ 10 sec backward). PT performing with pt for visual and audial cueing/pacing to assist pt with coordination.            Clinical Impression: Pt  demonstrates excellent motivation throughout session, agreeable and determined. Grapevine was performed with great cross-over and 100% accuracy at self-selected pace. PT was able to challenge lateral shuffle and grapevine by asking pt to increase velocity. CGA required on 2 occasions. Other progressions include squat jumps with no UE support, forward hopping and progressive fast feet held for increased time. Pt performed fast feet with improved coordination and rhythm with PT providing audial feedback. Pt will continue to benefit from skilled PT intervention in order to improve his LE strength, endurance, balance, coordination and to restore his PLOF.          PT Short Term Goals - 11/08/20 0932       PT SHORT TERM GOAL #1   Title Patient will be independent in home exercise program to improve strength/mobility for better functional independence with ADLs.    Time 6    Period Weeks    Status Achieved    Target Date 11/19/20               PT Long Term Goals - 11/11/20 1003       PT LONG TERM GOAL #1   Title Patient will increase FOTO score to equal to or greater than 77 to demonstrate statistically significant improvement in mobility and quality of life.    Baseline 10/08/20: 63, 9/30: 67    Time 12    Period Weeks    Status On-going    Target Date 12/31/20      PT LONG TERM GOAL #2   Title Pt will improve BERG by at least 3 points in order to demonstrate clinically significant improvement in balance.    Baseline 42 10/11/20    Time 8    Period Weeks    Status Achieved      PT LONG TERM GOAL #3   Title Patient will reduce timed up and go to <11 seconds to reduce fall risk and demonstrate improved transfer/gait ability.    Baseline 10/08/20: 12.68 seconds;10.3 sec    Time 12    Period Weeks    Status Achieved      PT LONG TERM GOAL #4   Title Patient will increase six minute walk test distance to >1000 for progression to community ambulator and improve gait ability     Baseline 10/08/20: 959f    Time 12    Period Weeks    Status Partially Met    Target Date 12/31/20      PT LONG TERM GOAL #5   Title Patient will increase Functional Gait Assessment score to >23/30 as to reduce fall risk and improve dynamic gait safety with community ambulation.    Baseline 10/15/20: 14/30. 20/30 9/30, revised goal  this session    Time 12    Period Weeks    Status Revised    Target Date 12/31/20      PT LONG TERM GOAL #6   Title Pt will improve score on mini BEST test by 4 points or greater in order to indicate improved balance and decreased risk of falls.    Baseline to be assessed visit 11 10/3: 20/28    Time 7    Period Weeks    Status New    Target Date 12/27/20                   Plan - 12/19/20 1143     Clinical Impression Statement Pt demonstrates excellent motivation throughout session, agreeable and determined. Grapevine was performed with great cross-over and 100% accuracy at self-selected pace. PT was able to challenge lateral shuffle and grapevine by asking pt to increase velocity. CGA required on 2 occasions. Other progressions include squat jumps with no UE support, forward hopping and progressive fast feet held for increased time. Pt performed fast feet with improved coordination and rhythm with PT providing audial feedback. Pt will continue to benefit from skilled PT intervention in order to improve his LE strength, endurance, balance, coordination and to restore his PLOF.    Personal Factors and Comorbidities Age;Comorbidity 1;Comorbidity 2    Comorbidities HTN , DM    Examination-Activity Limitations Locomotion Level;Transfers;Stairs    Examination-Participation Restrictions Driving;Occupation;Community Activity;Yard Work    Stability/Clinical Decision Making Stable/Uncomplicated    Rehab Potential Excellent    PT Frequency 2x / week    PT Duration 12 weeks    PT Treatment/Interventions ADLs/Self Care Home Management;Aquatic  Therapy;Biofeedback;Cryotherapy;Electrical Stimulation;Moist Heat;Traction;Ultrasound;Gait training;DME Instruction;Stair training;Functional mobility training;Therapeutic activities;Neuromuscular re-education;Balance training;Therapeutic exercise;Patient/family education;Manual techniques    PT Next Visit Plan begin dynamc gait training activity, continue with LE strengthening program    PT Home Exercise Plan Provided Access Code: [email or web address]  URL: [email or web address]  Date: 10/11/2020  Prepared by: Rivka Barbara    Consulted and Agree with Plan of Care Patient             Patient will benefit from skilled therapeutic intervention in order to improve the following deficits and impairments:  Abnormal gait, Decreased activity tolerance, Decreased endurance, Impaired sensation, Improper body mechanics, Decreased balance, Decreased coordination, Decreased mobility, Decreased safety awareness, Difficulty walking  Visit Diagnosis: Abnormality of gait and mobility  Other lack of coordination  Difficulty in walking, not elsewhere classified  Unsteadiness on feet  Muscle weakness (generalized)  Other abnormalities of gait and mobility     Problem List Patient Active Problem List   Diagnosis Date Noted   Muscle weakness (generalized) 12/05/2020   Abnormality of gait and mobility 12/05/2020   Unsteadiness on feet 12/05/2020   Other abnormalities of gait and mobility 12/05/2020   Difficulty in walking, not elsewhere classified 12/05/2020   Other lack of coordination 12/05/2020   Type 2 diabetes mellitus with neurological complications ([place]) 13/24/4010   Mixed hyperlipidemia 11/27/2020   Primary hypertension 11/27/2020   Vitamin D deficiency 10/04/2020   Mediastinal adenopathy 10/04/2020   Hyponatremia    Transaminitis    Diabetes mellitus, new onset ([place])    Malnutrition of moderate degree 09/13/2020   Stroke ([place]) 09/12/2020   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

## 2020-12-19 NOTE — Therapy (Signed)
[place] MAIN [facility] SERVICES [address] [place], [place], [address] Phone: [phone]   Fax:  [phone]  Occupational Therapy Treatment  Patient Details  Name: Malik Jones MRN: [id number] Date of Birth: 1960/03/20 Referring Provider (OT): Dr. Leeroy Cha   Encounter Date: 12/19/2020   OT End of Session - 12/19/20 0942     Visit Number 21    Number of Visits 24    Date for OT Re-Evaluation 12/30/20    Authorization Time Period Reporting period starting 11/11/2020    OT Start Time 0845    OT Stop Time 0929    OT Time Calculation (min) 44 min    Activity Tolerance Patient tolerated treatment well    Behavior During Therapy [facility] for tasks assessed/performed             Past Medical History:  Diagnosis Date   Diabetes mellitus without complication ([place])    Hyperlipidemia    Hypertension    Hyponatremia    Medical history non-contributory    Stroke [facility])     Past Surgical History:  Procedure Laterality Date   MANDIBLE FRACTURE SURGERY     NO PAST SURGERIES      There were no vitals filed for this visit.   Subjective Assessment - 12/19/20 0853     Subjective  "I got my covid booster on Tues.  No problems."    Pertinent History cerebellar stroke    Limitations balance, R sided weakness and ataxia    Patient Stated Goals "I want to get back to normal."    Currently in Pain? No/denies    Pain Score 0-No pain    Multiple Pain Sites No            Occupational Therapy Treatment: Therapeutic Exercise: Facilitated hand strengthening with use of hand gripper set at 28.9# to remove jumbo pegs from pegboard x3 trials using R hand.  Pt picked up 1/2"-1" washers from dish and placed over vertical dowels to target small item pick up and reaching toward a target using RUE.  OT adjusted positioning of dowels throughout activity to challenge reaching forward and laterally.  Pt participated in GMC/dynamic standing  throwing/catching frisbee disc using RUE.  Pt able to maintain balance with wide BOS when stepping all directions and alternated catching disc with R hand only and bilat hands.  Standing tolerance 15 min today without rest break.   Response to Treatment: See Plan/clinical impression below.    OT Education - 12/19/20 [phone]     Education Details HEP progression, continue to focus on grip and pinch strengthening with putty at home    Person(s) Educated Patient    Methods Explanation;Demonstration;Verbal cues    Comprehension Verbalized understanding;Verbal cues required              OT Short Term Goals - 12/17/20 0947       OT SHORT TERM GOAL #1   Title Pt will be indep with HEP for RUE strength/coordination    Baseline Eval: HEP initiated at eval, will continue to add on; indep with HEP    Time 6    Period Weeks    Status Achieved    Target Date 11/18/20               OT Long Term Goals - 12/17/20 0948       OT LONG TERM GOAL #1   Title Pt will increase FOTO score to 60 or better to indicate increased  functional performance.    Baseline Eval: FOTO 52; 11/08/20: FOTO score 67; 12/17/20: FOTO 61    Time 12    Period Weeks    Status On-going    Target Date 12/30/20      OT LONG TERM GOAL #2   Title Pt will increase R grip strength to 80 lbs or better to improve carrying heavier ADL supplies with R dominant hand.    Baseline Eval: R grip 65 lbs (L non-dominant 76 lbs); 11/08/20: R grip 79 lbs; 12/17/20: R grip 79 lbs (L 93 lbs)    Time 12    Period Weeks    Status Partially Met    Target Date 12/30/20      OT LONG TERM GOAL #3   Title Pt will improve ability to pick up and manipulate small ADL supplies without dropping with R dominant hand as demonstrated by improved 9 hole peg test score to <30 sec or better.    Baseline Eval: R 9 hole peg test 56.3 sec (L non-dominant 20.6 sec); 11/08/2020: 9 hole peg test 48 sec; 12/17/20: R 9 hole 35 secs (L 20 secs)    Time 12     Period Weeks    Status Partially Met    Target Date 12/30/20      OT LONG TERM GOAL #4   Title Pt will improve R GMC as demonstrated by ability to brush teeth, shave, eat with R dominant hand.    Baseline Eval: Pt using L non-dominant hand to brush teeth, shave, eat d/t moderate ataxia throughout RUE; 11/08/20: Pt still brushing teeth with L hand, son shaves him, but pt can eat finger foods with R hand.  Pt can self feed apple sauce with spoon in R hand with moderate ataxia, some spilling;; 12/17/20: Pt is now brushing teeth with some difficulty using R hand, pt shaves head with R hand, not yet shaving face (uses L hand), and eating with fair accuracy with R hand 75% of the time    Time 12    Period Weeks    Status On-going    Target Date 12/30/20      OT LONG TERM GOAL #5   Title Pt will tolerate at least 25 min of dynamic standing activity without rest break to build activity tolerance for return to work.    Baseline 12/17/20: Pt tolerates 15 min before rest break needed    Time 6    Period Weeks    Status New    Target Date 01/28/21               Plan - 12/19/20 0950     Clinical Impression Statement Pt continues to develop RUE [facility] [place] and is steadily engaging R hand into self care.  Pt reports improving accuracy with self feeding when using R hand when he can take his time.  Continued skilled OT necessary to maximize accuracy and efficiency when engaging R dominant arm into self care tasks and to continue to progress dynamic standing balance and tolerance in prep for return to work activity.    OT Occupational Profile and History Detailed Assessment- Review of Records and additional review of physical, cognitive, psychosocial history related to current functional performance    Occupational performance deficits (Please refer to evaluation for details): ADL's;IADL's;Leisure    Body Structure / Function / Physical Skills  ADL;Dexterity;Strength;Balance;Coordination;FMC;IADL;Endurance;Sensation;UE functional use;GMC    Rehab Potential Excellent    Clinical Decision Making Several treatment options, min-mod task  modification necessary    Comorbidities Affecting Occupational Performance: May have comorbidities impacting occupational performance    Modification or Assistance to Complete Evaluation  No modification of tasks or assist necessary to complete eval    OT Frequency 2x / week    OT Duration 12 weeks    OT Treatment/Interventions Self-care/ADL training;Therapeutic exercise;DME and/or AE instruction;Balance training;Neuromuscular education;Energy conservation;Therapeutic activities;Patient/family education;Coping strategies training    OT Home Exercise Plan theraputty increased to blue and instructed in exercises today    Consulted and Agree with Plan of Care Patient             Patient will benefit from skilled therapeutic intervention in order to improve the following deficits and impairments:   Body Structure / Function / Physical Skills: ADL, Dexterity, Strength, Balance, Coordination, FMC, IADL, Endurance, Sensation, UE functional use, GMC       Visit Diagnosis: Muscle weakness (generalized)  Other lack of coordination    Problem List Patient Active Problem List   Diagnosis Date Noted   Muscle weakness (generalized) 12/05/2020   Abnormality of gait and mobility 12/05/2020   Unsteadiness on feet 12/05/2020   Other abnormalities of gait and mobility 12/05/2020   Difficulty in walking, not elsewhere classified 12/05/2020   Other lack of coordination 12/05/2020   Type 2 diabetes mellitus with neurological complications ([place]) 63/49/4944   Mixed hyperlipidemia 11/27/2020   Primary hypertension 11/27/2020   Vitamin D deficiency 10/04/2020   Mediastinal adenopathy 10/04/2020   Hyponatremia    Transaminitis    Diabetes mellitus, new onset ([place])    Malnutrition of moderate degree  09/13/2020   Stroke ([place]) 09/12/2020   Aortic thrombus ([place])    Hyperlipidemia    Essential hypertension    Impaired fasting glucose    Cerebellar stroke ([place]) 09/11/2020   Leta Speller, MS, OTR/L  Darleene Cleaver, OT/L 12/19/2020, 9:51 AM  [place] [address] [place], [place], [address] Phone: [phone]   Fax:  [phone]  Name: Malik Jones MRN: [id number] Date of Birth: Feb 06, 1961

## 2020-12-21 ENCOUNTER — Other Ambulatory Visit: Payer: Self-pay | Admitting: Physical Medicine and Rehabilitation

## 2020-12-23 ENCOUNTER — Encounter: Payer: Self-pay | Admitting: Internal Medicine

## 2020-12-23 ENCOUNTER — Other Ambulatory Visit: Payer: Self-pay

## 2020-12-23 ENCOUNTER — Ambulatory Visit (INDEPENDENT_AMBULATORY_CARE_PROVIDER_SITE_OTHER): Payer: BC Managed Care – PPO | Admitting: Internal Medicine

## 2020-12-23 VITALS — BP 120/68 | HR 73 | Temp 98.7°F | Ht 64.96 in | Wt 150.2 lb

## 2020-12-23 DIAGNOSIS — E785 Hyperlipidemia, unspecified: Secondary | ICD-10-CM

## 2020-12-23 DIAGNOSIS — R262 Difficulty in walking, not elsewhere classified: Secondary | ICD-10-CM

## 2020-12-23 DIAGNOSIS — E1149 Type 2 diabetes mellitus with other diabetic neurological complication: Secondary | ICD-10-CM | POA: Diagnosis not present

## 2020-12-23 DIAGNOSIS — E1169 Type 2 diabetes mellitus with other specified complication: Secondary | ICD-10-CM | POA: Insufficient documentation

## 2020-12-23 MED ORDER — HYDROCHLOROTHIAZIDE 12.5 MG PO CAPS: 12.5000 mg | ORAL_CAPSULE | Freq: Every day | ORAL | 3 refills | Status: DC

## 2020-12-23 MED ORDER — ATORVASTATIN CALCIUM 80 MG PO TABS: 80.0000 mg | ORAL_TABLET | Freq: Every day | ORAL | 1 refills | Status: DC

## 2020-12-23 NOTE — Progress Notes (Signed)
BP 120/68   Pulse 73   Temp 98.7 F (37.1 C) (Oral)   Ht 5' 4.96" (1.65 m)   Wt 150 lb 3.2 oz (68.1 kg)   SpO2 98%   BMI 25.02 kg/m    Subjective:    Patient ID: Malik Jones, male    DOB: Jan 03, 1961, 60 y.o.   MRN: [id number]  Chief Complaint  Patient presents with  . Cerebrovascular Accident    Here to follow up  . referral    Patient states that he needs a referral to Nutritionist.  . Short Term disability    Patient states paperwork has been sent here for short term disability     HPI: Malik Jones is a 60 y.o. male  Cerebrovascular Accident This is a chronic problem. Associated symptoms include weakness. Pertinent negatives include no abdominal pain, arthralgias, chest pain, chills, congestion, coughing, fatigue, fever, headaches, joint swelling, myalgias, nausea, numbness, rash or vomiting.  Diabetes He presents for his follow-up (a1c - 6.4 is on metformin 500 mg) diabetic visit. He has type 2 diabetes mellitus. Pertinent negatives for hypoglycemia include no confusion, dizziness, headaches, speech difficulty or tremors. Associated symptoms include weakness. Pertinent negatives for diabetes include no chest pain, no fatigue, no polydipsia, no polyphagia and no polyuria.  Hypertension This is a chronic (Ha sbeen c/o nocturia x 2 at night.) problem. Pertinent negatives include no chest pain, headaches, palpitations or shortness of breath.   Chief Complaint  Patient presents with  . Cerebrovascular Accident    Here to follow up  . referral    Patient states that he needs a referral to Nutritionist.  . Short Term disability    Patient states paperwork has been sent here for short term disability     Relevant past medical, surgical, family and social history reviewed and updated as indicated. Interim medical history since our last visit reviewed. Allergies and medications reviewed and updated.  Review of Systems  Constitutional:  Negative for activity change,  appetite change, chills, fatigue and fever.  HENT:  Negative for congestion, ear discharge, ear pain and facial swelling.   Eyes:  Negative for pain, discharge and itching.  Respiratory:  Negative for cough, chest tightness, shortness of breath and wheezing.   Cardiovascular:  Negative for chest pain, palpitations and leg swelling.  Gastrointestinal:  Negative for abdominal distention, abdominal pain, blood in stool, constipation, diarrhea, nausea and vomiting.  Endocrine: Negative for cold intolerance, heat intolerance, polydipsia, polyphagia and polyuria.  Genitourinary:  Negative for difficulty urinating, dysuria, flank pain, frequency, hematuria and urgency.  Musculoskeletal:  Negative for arthralgias, gait problem, joint swelling and myalgias.  Skin:  Negative for color change, rash and wound.  Neurological:  Positive for weakness. Negative for dizziness, tremors, speech difficulty, light-headedness, numbness and headaches.       Rt hand weakness +   Hematological:  Does not bruise/bleed easily.  Psychiatric/Behavioral:  Negative for agitation, confusion, decreased concentration, sleep disturbance and suicidal ideas.    Per HPI unless specifically indicated above     Objective:    BP 120/68   Pulse 73   Temp 98.7 F (37.1 C) (Oral)   Ht 5' 4.96" (1.65 m)   Wt 150 lb 3.2 oz (68.1 kg)   SpO2 98%   BMI 25.02 kg/m   Wt Readings from Last 3 Encounters:  12/23/20 150 lb 3.2 oz (68.1 kg)  11/27/20 150 lb 3.2 oz (68.1 kg)  11/12/20 148 lb 9.6 oz (67.4 kg)  Physical Exam Vitals and nursing note reviewed.  Constitutional:      General: He is not in acute distress.    Appearance: Normal appearance. He is not ill-appearing or diaphoretic.  HENT:     Head: Normocephalic and atraumatic.     Right Ear: Tympanic membrane and external ear normal. There is no impacted cerumen.     Left Ear: External ear normal.     Nose: No congestion or rhinorrhea.     Mouth/Throat:     Pharynx: No  oropharyngeal exudate or posterior oropharyngeal erythema.  Eyes:     Conjunctiva/sclera: Conjunctivae normal.     Pupils: Pupils are equal, round, and reactive to light.  Cardiovascular:     Rate and Rhythm: Normal rate and regular rhythm.     Heart sounds: No murmur heard.   No friction rub. No gallop.  Pulmonary:     Effort: No respiratory distress.     Breath sounds: No stridor. No wheezing or rhonchi.  Chest:     Chest wall: No tenderness.  Musculoskeletal:        General: No deformity.     Cervical back: Normal range of motion and neck supple. No rigidity or tenderness.     Left lower leg: No edema.  Skin:    General: Skin is warm and dry.  Neurological:     Mental Status: He is alert and oriented to person, place, and time.     Cranial Nerves: No cranial nerve deficit.     Sensory: No sensory deficit.     Motor: Weakness present.     Coordination: Coordination normal.     Gait: Gait normal.     Deep Tendon Reflexes: Reflexes normal.    Results for orders placed or performed in visit on 11/27/20  Bayer DCA Hb A1c Waived  Result Value Ref Range   HB A1C (BAYER DCA - WAIVED) 6.4 (H) 4.8 - 5.6 %  Lipid panel  Result Value Ref Range   Cholesterol, Total 153 100 - 199 mg/dL   Triglycerides 145 0 - 149 mg/dL   HDL 43 >39 mg/dL   VLDL Cholesterol Cal 25 5 - 40 mg/dL   LDL Chol Calc (NIH) 85 0 - 99 mg/dL   Chol/HDL Ratio 3.6 0.0 - 5.0 ratio  Vitamin B12  Result Value Ref Range   Vitamin B-12 371 232 - 1,245 pg/mL  Urinalysis, Routine w reflex microscopic  Result Value Ref Range   Specific Gravity, UA 1.015 1.005 - 1.030   pH, UA 6.5 5.0 - 7.5   Color, UA Yellow Yellow   Appearance Ur Clear Clear   Leukocytes,UA Negative Negative   Protein,UA Negative Negative/Trace   Glucose, UA Negative Negative   Ketones, UA Negative Negative   RBC, UA Negative Negative   Bilirubin, UA Negative Negative   Urobilinogen, Ur 0.2 0.2 - 1.0 mg/dL   Nitrite, UA Negative Negative   CBC with Differential/Platelet  Result Value Ref Range   WBC 6.8 3.4 - 10.8 x10E3/uL   RBC 4.27 4.14 - 5.80 x10E6/uL   Hemoglobin 13.3 13.0 - 17.7 g/dL   Hematocrit 39.7 37.5 - 51.0 %   MCV 93 79 - 97 fL   MCH 31.1 26.6 - 33.0 pg   MCHC 33.5 31.5 - 35.7 g/dL   RDW 12.9 11.6 - 15.4 %   Platelets 375 150 - 450 x10E3/uL   Neutrophils 67 Not Estab. %   Lymphs 20 Not Estab. %   Monocytes 10  Not Estab. %   Eos 2 Not Estab. %   Basos 1 Not Estab. %   Neutrophils Absolute 4.5 1.4 - 7.0 x10E3/uL   Lymphocytes Absolute 1.4 0.7 - 3.1 x10E3/uL   Monocytes Absolute 0.7 0.1 - 0.9 x10E3/uL   EOS (ABSOLUTE) 0.1 0.0 - 0.4 x10E3/uL   Basophils Absolute 0.1 0.0 - 0.2 x10E3/uL   Immature Granulocytes 0 Not Estab. %   Immature Grans (Abs) 0.0 0.0 - 0.1 x10E3/uL  Comprehensive metabolic panel  Result Value Ref Range   Glucose 110 (H) 70 - 99 mg/dL   BUN 12 6 - 24 mg/dL   Creatinine, Ser 0.95 0.76 - 1.27 mg/dL   eGFR 93 >59 mL/min/1.73   BUN/Creatinine Ratio 13 9 - 20   Sodium 137 134 - 144 mmol/L   Potassium 4.2 3.5 - 5.2 mmol/L   Chloride 97 96 - 106 mmol/L   CO2 22 20 - 29 mmol/L   Calcium 9.9 8.7 - 10.2 mg/dL   Total Protein 8.3 6.0 - 8.5 g/dL   Albumin 5.1 (H) 3.8 - 4.9 g/dL   Globulin, Total 3.2 1.5 - 4.5 g/dL   Albumin/Globulin Ratio 1.6 1.2 - 2.2   Bilirubin Total 0.4 0.0 - 1.2 mg/dL   Alkaline Phosphatase 66 44 - 121 IU/L   AST 20 0 - 40 IU/L   ALT 29 0 - 44 IU/L        Current Outpatient Medications:  .  amLODipine (NORVASC) 10 MG tablet, Take 1 tablet (10 mg total) by mouth daily., Disp: 30 tablet, Rfl: 1 .  ELIQUIS 5 MG TABS tablet, Take 1 tablet by mouth twice daily, Disp: 60 tablet, Rfl: 0 .  hydrochlorothiazide (MICROZIDE) 12.5 MG capsule, Take 3 capsules (37.5 mg total) by mouth daily., Disp: 90 capsule, Rfl: 1 .  metFORMIN (GLUCOPHAGE) 500 MG tablet, Take 1 tablet (500 mg total) by mouth daily with breakfast., Disp: 30 tablet, Rfl: 3 .  atorvastatin (LIPITOR) 80 MG  tablet, Take 1 tablet (80 mg total) by mouth daily., Disp: 90 tablet, Rfl: 1    Assessment & Plan:  DM :  check HbA1c,  urine  microalbumin  diabetic diet plan given to pt  adviced regarding hypoglycemia and instructions given to pt today on how to prevent and treat the same if it were to occur. pt acknowledges the plan and voices understanding of the same.  exercise plan given and encouraged.   advice diabetic yearly podiatry, ophthalmology , nutritionist , dental check q 6 months,   2. CVA : has had a stroke  Is on eliquis for such  To see neurology @ [place]   3. Htn is stbale, is on hctz 37.5 mg cut back 12.5 mg and norvasc for such Ha sbeen c/o nocturia x 2 at night.  Continue current meds.  Medication compliance emphasised. pt advised to keep Bp logs. Pt verbalised understanding of the same. Pt to have a low salt diet . Exercise to reach a goal of at least 150 mins a week.  lifestyle modifications explained and pt understands importance of the above.   ECHO : EF 70 % 09/2020  Problem List Items Addressed This Visit   None    No orders of the defined types were placed in this encounter.    Meds ordered this encounter  Medications  . atorvastatin (LIPITOR) 80 MG tablet    Sig: Take 1 tablet (80 mg total) by mouth daily.    Dispense:  90 tablet  Refill:  1    Courtesy refill until patient is established with PCP 11/26/20 and they will assume prescribing     Follow up plan: No follow-ups on file.

## 2020-12-24 ENCOUNTER — Ambulatory Visit: Payer: BC Managed Care – PPO | Admitting: Physical Therapy

## 2020-12-24 ENCOUNTER — Ambulatory Visit: Payer: BC Managed Care – PPO

## 2020-12-24 DIAGNOSIS — R262 Difficulty in walking, not elsewhere classified: Secondary | ICD-10-CM

## 2020-12-24 DIAGNOSIS — R269 Unspecified abnormalities of gait and mobility: Secondary | ICD-10-CM

## 2020-12-24 DIAGNOSIS — M6281 Muscle weakness (generalized): Secondary | ICD-10-CM | POA: Diagnosis not present

## 2020-12-24 DIAGNOSIS — I639 Cerebral infarction, unspecified: Secondary | ICD-10-CM

## 2020-12-24 DIAGNOSIS — R278 Other lack of coordination: Secondary | ICD-10-CM

## 2020-12-24 DIAGNOSIS — R2681 Unsteadiness on feet: Secondary | ICD-10-CM

## 2020-12-24 DIAGNOSIS — R2689 Other abnormalities of gait and mobility: Secondary | ICD-10-CM | POA: Diagnosis not present

## 2020-12-24 NOTE — Therapy (Signed)
[place] MAIN [facility] SERVICES [address] [place], [place], [address] Phone: [phone]   Fax:  [phone]  Occupational Therapy Treatment  Patient Details  Name: Malik Jones MRN: [id number] Date of Birth: [birth year] Referring Provider (OT): Dr. Leeroy Cha   Encounter Date: 12/24/2020   OT End of Session - 12/24/20 0852     Visit Number 22    Number of Visits 24    Date for OT Re-Evaluation 12/30/20    Authorization Time Period Reporting period starting 11/11/2020    OT Start Time 0845    OT Stop Time 0930    OT Time Calculation (min) 45 min    Activity Tolerance Patient tolerated treatment well    Behavior During Therapy [facility] for tasks assessed/performed             Past Medical History:  Diagnosis Date   Diabetes mellitus without complication ([place])    Hyperlipidemia    Hypertension    Hyponatremia    Medical history non-contributory    Stroke [facility])     Past Surgical History:  Procedure Laterality Date   MANDIBLE FRACTURE SURGERY     NO PAST SURGERIES      There were no vitals filed for this visit.   Subjective Assessment - 12/24/20 0851     Subjective  "I'm going to start my walking today if it doesn't rain all day."    Pertinent History cerebellar stroke    Limitations balance, R sided weakness and ataxia    Patient Stated Goals "I want to get back to normal."    Currently in Pain? No/denies    Pain Score 0-No pain    Multiple Pain Sites No            Occupational Therapy Treatment: Neuro re-ed: RUE FMC/GMC activity using marker and white board propped vertically on table top to complete tracing and drawing diagonal/vertical/horizontal/circular patterns using lines to target specific heights and points when drawing.     Therapeutic Exercise: Facilitated hand strengthening with use of hand gripper set at 28.9# to remove jumbo pegs from pegboard x3 trials using R hand.  Practiced overhead reaching  with R hand only and BUEs for repeated lifting of 5 lbs to various counter and shelf heights to simulate stocking activity required for work performance.    Self Care: Use of R hand to refill paper cup of water and transport 20 feet x3 trials, sipped drink from seated position using R hand.  No spilling but increased caution and slower pace needed when RUE was fatigued from therapeutic exercises noted above.  Response to Treatment: Pt reports continued improvements with use of R hand to self feed with less shaking and spilling.  No spilling of water cup today but increased caution and slower pace needed d/t increased shaking using R hand when RUE was fatigued after participation in therapeutic exercises noted above.  Pt will continue to benefit from skilled OT for increasing strength and coordination throughout RUE in order to maximize accuracy and efficiency when engaging RUE into functional activities.     OT Education - 12/24/20 [phone]     Education Details HEP progression, continue to focus on grip and pinch strengthening with putty at home    Person(s) Educated Patient    Methods Explanation;Demonstration;Verbal cues    Comprehension Verbalized understanding;Verbal cues required              OT Short Term Goals - 12/17/20 6060  OT SHORT TERM GOAL #1   Title Pt will be indep with HEP for RUE strength/coordination    Baseline Eval: HEP initiated at eval, will continue to add on; indep with HEP    Time 6    Period Weeks    Status Achieved    Target Date 11/18/20               OT Long Term Goals - 12/17/20 0948       OT LONG TERM GOAL #1   Title Pt will increase FOTO score to 60 or better to indicate increased functional performance.    Baseline Eval: FOTO 52; 11/08/20: FOTO score 67; 12/17/20: FOTO 61    Time 12    Period Weeks    Status On-going    Target Date 12/30/20      OT LONG TERM GOAL #2   Title Pt will increase R grip strength to 80 lbs or better to  improve carrying heavier ADL supplies with R dominant hand.    Baseline Eval: R grip 65 lbs (L non-dominant 76 lbs); 11/08/20: R grip 79 lbs; 12/17/20: R grip 79 lbs (L 93 lbs)    Time 12    Period Weeks    Status Partially Met    Target Date 12/30/20      OT LONG TERM GOAL #3   Title Pt will improve ability to pick up and manipulate small ADL supplies without dropping with R dominant hand as demonstrated by improved 9 hole peg test score to <30 sec or better.    Baseline Eval: R 9 hole peg test 56.3 sec (L non-dominant 20.6 sec); 11/08/2020: 9 hole peg test 48 sec; 12/17/20: R 9 hole 35 secs (L 20 secs)    Time 12    Period Weeks    Status Partially Met    Target Date 12/30/20      OT LONG TERM GOAL #4   Title Pt will improve R GMC as demonstrated by ability to brush teeth, shave, eat with R dominant hand.    Baseline Eval: Pt using L non-dominant hand to brush teeth, shave, eat d/t moderate ataxia throughout RUE; 11/08/20: Pt still brushing teeth with L hand, son shaves him, but pt can eat finger foods with R hand.  Pt can self feed apple sauce with spoon in R hand with moderate ataxia, some spilling;; 12/17/20: Pt is now brushing teeth with some difficulty using R hand, pt shaves head with R hand, not yet shaving face (uses L hand), and eating with fair accuracy with R hand 75% of the time    Time 12    Period Weeks    Status On-going    Target Date 12/30/20      OT LONG TERM GOAL #5   Title Pt will tolerate at least 25 min of dynamic standing activity without rest break to build activity tolerance for return to work.    Baseline 12/17/20: Pt tolerates 15 min before rest break needed    Time 6    Period Weeks    Status New    Target Date 01/28/21              Plan - 12/24/20 1013     Clinical Impression Statement Pt reports continued improvements with use of R hand to self feed with less shaking and spilling.  No spilling of water cup today but increased caution and slower pace  needed d/t increased shaking using R hand when RUE  was fatigued after participation in therapeutic exercises noted above.  Pt will continue to benefit from skilled OT for increasing strength and coordination throughout RUE in order to maximize accuracy and efficiency when engaging RUE into functional activities.    OT Occupational Profile and History Detailed Assessment- Review of Records and additional review of physical, cognitive, psychosocial history related to current functional performance    Occupational performance deficits (Please refer to evaluation for details): ADL's;IADL's;Leisure    Body Structure / Function / Physical Skills ADL;Dexterity;Strength;Balance;Coordination;FMC;IADL;Endurance;Sensation;UE functional use;GMC    Rehab Potential Excellent    Clinical Decision Making Several treatment options, min-mod task modification necessary    Comorbidities Affecting Occupational Performance: May have comorbidities impacting occupational performance    Modification or Assistance to Complete Evaluation  No modification of tasks or assist necessary to complete eval    OT Frequency 2x / week    OT Duration 12 weeks    OT Treatment/Interventions Self-care/ADL training;Therapeutic exercise;DME and/or AE instruction;Balance training;Neuromuscular education;Energy conservation;Therapeutic activities;Patient/family education;Coping strategies training    OT Home Exercise Plan theraputty increased to blue and instructed in exercises today    Consulted and Agree with Plan of Care Patient             Patient will benefit from skilled therapeutic intervention in order to improve the following deficits and impairments:   Body Structure / Function / Physical Skills: ADL, Dexterity, Strength, Balance, Coordination, FMC, IADL, Endurance, Sensation, UE functional use, GMC       Visit Diagnosis: Muscle weakness (generalized)  Other lack of coordination  Cerebellar stroke [facility])    Problem  List Patient Active Problem List   Diagnosis Date Noted   Hyperlipidemia associated with type 2 diabetes mellitus ([place]) 12/23/2020   Muscle weakness (generalized) 12/05/2020   Abnormality of gait and mobility 12/05/2020   Unsteadiness on feet 12/05/2020   Other abnormalities of gait and mobility 12/05/2020   Difficulty in walking, not elsewhere classified 12/05/2020   Other lack of coordination 12/05/2020   Type 2 diabetes mellitus with neurological complications ([place]) 01/41/0301   Mixed hyperlipidemia 11/27/2020   Primary hypertension 11/27/2020   Vitamin D deficiency 10/04/2020   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

## 2020-12-24 NOTE — Therapy (Signed)
[place] MAIN [facility] SERVICES [address] [place], [place], [address] Phone: [phone]   Fax:  [phone]  Physical Therapy Treatment  Patient Details  Name: Malik Jones MRN: [id number] Date of Birth: 19-May-1960 No data recorded  Encounter Date: 12/24/2020   PT End of Session - 12/24/20 0837     Visit Number 22    Number of Visits 24    Date for PT Re-Evaluation 12/31/20    Authorization - Visit Number 16    Authorization - Number of Visits 24    Progress Note Due on Visit 30    PT Start Time 0810    PT Stop Time 0845    PT Time Calculation (min) 35 min    Equipment Utilized During Treatment Gait belt    Activity Tolerance Patient tolerated treatment well    Behavior During Therapy WFL for tasks assessed/performed             Past Medical History:  Diagnosis Date   Diabetes mellitus without complication ([place])    Hyperlipidemia    Hypertension    Hyponatremia    Medical history non-contributory    Stroke [facility])     Past Surgical History:  Procedure Laterality Date   MANDIBLE FRACTURE SURGERY     NO PAST SURGERIES      There were no vitals filed for this visit.   Subjective Assessment - 12/24/20 0812     Subjective pt reports he went to MD yesterday and reports he needs to call his MD regarding continued use of eliquis. REports his primary care altered some of his medication. Reports he is taking lipitor, BP, metformin and a few other drugs a this time.    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 Infarct occured on 09/11/20; no significant PMH. He attended inpatient rehab services at [facility] for 2 weeks and d/c from inpatient 10 days ago. He lived with his brother for 1 week before moving back into his own home. He is now living independently with abundant  family check-ins. He is currently not driving or employed. He states his employer has had to post his job during this time. Pt reports 1 fall immediately after stroke, otherwise no history of falls. Only PT he has recieved has been post-infarct. Pt arrivied with RW however states he rarely uses it and does nto use it in the home.    Limitations House hold activities;Walking;Lifting;Writing    How long can you sit comfortably? unaffected    How long can you stand comfortably? states standing is not a problem    How long can you walk comfortably? states walking is not a problem    Patient Stated Goals return to work and driving, be fully independent as he was before cerebellar infarct    Currently in Pain? No/denies              INTERVENTIONS   Neuro Re- Ed:  CGA provided at all times using gait belt.   Side stepping 75f, 17.5# using Matrix cable machine, x2 each direction.    Lateral shuffle, 210f 5x each direction. Increased difficulty leading with RLE. 1 stumble during rightward shuffle due to lag time of RLE with LLE progressing at a faster pace. VC for increased cadence final 3 reps.    Ladder drills  : 2 feet in ea x  5 laps (10 x through)  -difficulty with coordination, particularly with increased speed, ipmroved effficacy with increased practivce but still noted errors  Forward hops - 4 x 9 (in ladder)  -improved efficacy with cues for countermovement prior to each jump     Therapeutic Activities :  Leg press to improve LE strenght and power - seat level 3  1st set 85# x 15 reps B LE  2nd set 100# x 10 reps B LE  1 x 15 @ 25# R LE only 1 x 8 @ 40 # R LE only      Squat jump 1x10 with TRX -cues for eccentric control and smooth transition to each repetition. Also cues for equal landing on ea LE.                           PT Education - 12/24/20 0836     Education Details Excercise form and technique    Person(s) Educated Patient    Methods  Explanation    Comprehension Verbalized understanding              PT Short Term Goals - 11/08/20 0808       PT SHORT TERM GOAL #1   Title Patient will be independent in home exercise program to improve strength/mobility for better functional independence with ADLs.    Time 6    Period Weeks    Status Achieved    Target Date 11/19/20               PT Long Term Goals - 11/11/20 1003       PT LONG TERM GOAL #1   Title Patient will increase FOTO score to equal to or greater than 77 to demonstrate statistically significant improvement in mobility and quality of life.    Baseline 10/08/20: 63, 9/30: 67    Time 12    Period Weeks    Status On-going    Target Date 12/31/20      PT LONG TERM GOAL #2   Title Pt will improve BERG by at least 3 points in order to demonstrate clinically significant improvement in balance.    Baseline 42 10/11/20    Time 8    Period Weeks    Status Achieved      PT LONG TERM GOAL #3   Title Patient will reduce timed up and go to <11 seconds to reduce fall risk and demonstrate improved transfer/gait ability.    Baseline 10/08/20: 12.68 seconds;10.3 sec    Time 12    Period Weeks    Status Achieved      PT LONG TERM GOAL #4   Title Patient will increase six minute walk test distance to >1000 for progression to community ambulator and improve gait ability    Baseline 10/08/20: 945f    Time 12    Period Weeks    Status Partially Met    Target Date 12/31/20      PT LONG TERM GOAL #5   Title Patient will increase Functional Gait Assessment score to >23/30 as to reduce fall risk and improve dynamic gait safety with community ambulation.    Baseline 10/15/20: 14/30. 20/30 9/30, revised goal this session    Time 12    Period Weeks    Status Revised    Target Date 12/31/20      PT LONG TERM GOAL #6   Title Pt will improve score on mini BEST test  by 4 points or greater in order to indicate improved balance and decreased risk of falls.     Baseline to be assessed visit 11 10/3: 20/28    Time 7    Period Weeks    Status New    Target Date 12/27/20                   Plan - 12/24/20 1047     Clinical Impression Statement Pt continues to demonstrate excellent motivation for completin of therapy program. Pt was challenged with leg press added today and had increased difficulty due to R sided weakness. Pt also challneged with ladder drills training today and had difficulty with coordination of LE in each position. Pt did improve with ladder drills with increased practice but continued so show errors at increased speeds. Pt able to perform hopping activities with increased efficacy today compared to previous sessions with cues for countermovement prior to each. Pt will continue to benefit from skilled PT services in order to improve his strength, balance and rstore him to his PLOF.    Personal Factors and Comorbidities Age;Comorbidity 1;Comorbidity 2    Comorbidities HTN , DM    Examination-Activity Limitations Locomotion Level;Transfers;Stairs    Examination-Participation Restrictions Driving;Occupation;Community Activity;Yard Work    Stability/Clinical Decision Making Stable/Uncomplicated    Rehab Potential Excellent    PT Frequency 2x / week    PT Duration 12 weeks    PT Treatment/Interventions ADLs/Self Care Home Management;Aquatic Therapy;Biofeedback;Cryotherapy;Electrical Stimulation;Moist Heat;Traction;Ultrasound;Gait training;DME Instruction;Stair training;Functional mobility training;Therapeutic activities;Neuromuscular re-education;Balance training;Therapeutic exercise;Patient/family education;Manual techniques    PT Next Visit Plan begin dynamc gait training activity, continue with LE strengthening program    PT Home Exercise Plan Provided Access Code: [email or web address]  URL: [email or web address]  Date: 10/11/2020  Prepared by: Rivka Barbara    Consulted and Agree with Plan of Care Patient              Patient will benefit from skilled therapeutic intervention in order to improve the following deficits and impairments:  Abnormal gait, Decreased activity tolerance, Decreased endurance, Impaired sensation, Improper body mechanics, Decreased balance, Decreased coordination, Decreased mobility, Decreased safety awareness, Difficulty walking  Visit Diagnosis: Abnormality of gait and mobility  Difficulty in walking, not elsewhere classified  Unsteadiness on feet  Muscle weakness (generalized)     Problem List Patient Active Problem List   Diagnosis Date Noted   Hyperlipidemia associated with type 2 diabetes mellitus ([place]) 12/23/2020   Muscle weakness (generalized) 12/05/2020   Abnormality of gait and mobility 12/05/2020   Unsteadiness on feet 12/05/2020   Other abnormalities of gait and mobility 12/05/2020   Difficulty in walking, not elsewhere classified 12/05/2020   Other lack of coordination 12/05/2020   Type 2 diabetes mellitus with neurological complications ([place]) 54/56/2563   Mixed hyperlipidemia 11/27/2020   Primary hypertension 11/27/2020   Vitamin D deficiency 10/04/2020   Mediastinal adenopathy 10/04/2020   Hyponatremia    Transaminitis    Diabetes mellitus, new onset ([place])    Malnutrition of moderate degree 09/13/2020   Stroke ([place]) 09/12/2020   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

## 2020-12-26 ENCOUNTER — Other Ambulatory Visit: Payer: Self-pay

## 2020-12-26 ENCOUNTER — Ambulatory Visit: Payer: BC Managed Care – PPO

## 2020-12-26 ENCOUNTER — Ambulatory Visit: Payer: BC Managed Care – PPO | Admitting: Physical Therapy

## 2020-12-26 DIAGNOSIS — I639 Cerebral infarction, unspecified: Secondary | ICD-10-CM

## 2020-12-26 DIAGNOSIS — R2681 Unsteadiness on feet: Secondary | ICD-10-CM

## 2020-12-26 DIAGNOSIS — R269 Unspecified abnormalities of gait and mobility: Secondary | ICD-10-CM

## 2020-12-26 DIAGNOSIS — M6281 Muscle weakness (generalized): Secondary | ICD-10-CM

## 2020-12-26 DIAGNOSIS — R262 Difficulty in walking, not elsewhere classified: Secondary | ICD-10-CM

## 2020-12-26 DIAGNOSIS — R2689 Other abnormalities of gait and mobility: Secondary | ICD-10-CM | POA: Diagnosis not present

## 2020-12-26 DIAGNOSIS — R278 Other lack of coordination: Secondary | ICD-10-CM | POA: Diagnosis not present

## 2020-12-26 NOTE — Therapy (Signed)
[place] MAIN [facility] SERVICES [address] [place], [place], [address] Phone: [phone]   Fax:  [phone]  Physical Therapy Treatment  Patient Details  Name: Malik Jones MRN: [id number] Date of Birth: [birth year] No data recorded  Encounter Date: 12/26/2020   PT End of Session - 12/26/20 1239     Visit Number 23    Number of Visits 24    Date for PT Re-Evaluation 12/31/20    Authorization - Number of Visits 24    Progress Note Due on Visit 30    PT Start Time 0816    PT Stop Time 0845    PT Time Calculation (min) 29 min    Equipment Utilized During Treatment Gait belt    Activity Tolerance Patient tolerated treatment well    Behavior During Therapy WFL for tasks assessed/performed             Past Medical History:  Diagnosis Date   Diabetes mellitus without complication ([place])    Hyperlipidemia    Hypertension    Hyponatremia    Medical history non-contributory    Stroke [facility])     Past Surgical History:  Procedure Laterality Date   MANDIBLE FRACTURE SURGERY     NO PAST SURGERIES      There were no vitals filed for this visit.   Subjective Assessment - 12/26/20 0820     Subjective Pt rpeorts he was able to copmlete 8 laps to copmlete a mile at the walking track yesterday in addidiotn to walking there and back. pt reports walk lasted about an hour. He reports he is a little sore at this time.    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 Infarct occured on 09/11/20; no significant PMH. He attended inpatient rehab services at [facility] for 2 weeks and d/c from inpatient 10 days ago. He lived with his brother for 1 week before moving back into his own home. He is now living independently with abundant family check-ins. He is currently not driving or employed. He  states his employer has had to post his job during this time. Pt reports 1 fall immediately after stroke, otherwise no history of falls. Only PT he has recieved has been post-infarct. Pt arrivied with RW however states he rarely uses it and does nto use it in the home.    Limitations House hold activities;Walking;Lifting;Writing    How long can you sit comfortably? unaffected    How long can you stand comfortably? states standing is not a problem    How long can you walk comfortably? states walking is not a problem    Patient Stated Goals return to work and driving, be fully independent as he was before cerebellar infarct              CGA provided at all times using gait belt.   Side stepping 67f, 17.5# using Matrix cable machine, x3 each direction.    Ladder drills   2 feet in ea x 5 laps (10 x through) x 2 repetitions, cues for progressing speed as errors decreased, less errors with this method compered ot last session -difficulty with coordination, particularly with increased speed, ipmroved effficacy with increased practivce but still noted errors  Forward hops - 4 x 9 (in ladder)  -improved efficacy with cues for countermovement prior to each jump   Lateral  shuffle, 5x each direction.in ladder   Therapeutic Activities :    Leg pres R LE only for targeted R LE strength  55 # 2 x 10 reps   Pt session was cut a little short today due to pt arriving a few minutes late to scheduled appointment time                           PT Short Term Goals - 11/08/20 0093       PT SHORT TERM GOAL #1   Title Patient will be independent in home exercise program to improve strength/mobility for better functional independence with ADLs.    Time 6    Period Weeks    Status Achieved    Target Date 11/19/20               PT Long Term Goals - 11/11/20 1003       PT LONG TERM GOAL #1   Title Patient will increase FOTO score to equal to or greater than 77 to  demonstrate statistically significant improvement in mobility and quality of life.    Baseline 10/08/20: 63, 9/30: 67    Time 12    Period Weeks    Status On-going    Target Date 12/31/20      PT LONG TERM GOAL #2   Title Pt will improve BERG by at least 3 points in order to demonstrate clinically significant improvement in balance.    Baseline 42 10/11/20    Time 8    Period Weeks    Status Achieved      PT LONG TERM GOAL #3   Title Patient will reduce timed up and go to <11 seconds to reduce fall risk and demonstrate improved transfer/gait ability.    Baseline 10/08/20: 12.68 seconds;10.3 sec    Time 12    Period Weeks    Status Achieved      PT LONG TERM GOAL #4   Title Patient will increase six minute walk test distance to >1000 for progression to community ambulator and improve gait ability    Baseline 10/08/20: 964f    Time 12    Period Weeks    Status Partially Met    Target Date 12/31/20      PT LONG TERM GOAL #5   Title Patient will increase Functional Gait Assessment score to >23/30 as to reduce fall risk and improve dynamic gait safety with community ambulation.    Baseline 10/15/20: 14/30. 20/30 9/30, revised goal this session    Time 12    Period Weeks    Status Revised    Target Date 12/31/20      PT LONG TERM GOAL #6   Title Pt will improve score on mini BEST test by 4 points or greater in order to indicate improved balance and decreased risk of falls.    Baseline to be assessed visit 11 10/3: 20/28    Time 7    Period Weeks    Status New    Target Date 12/27/20                   Plan - 12/26/20 1239     Clinical Impression Statement Pt continues to demonstrate excellent motivation for completin of therapy program. Pt was challenged with leg press added today and had increased difficulty due to R sided weakness. Pt also challneged with ladder drills training today and had difficulty with coordination of  LE in each position. Pt did improve with ladder  drills with increased practice but continued so show errors at increased speeds. Pt able to perform hopping activities with increased efficacy today compared to previous sessions with cues for countermovement prior to each. Pt will continue to benefit from skilled PT services in order to improve his strength, balance and rstore him to his PLOF.    Personal Factors and Comorbidities Age;Comorbidity 1;Comorbidity 2    Comorbidities HTN , DM    Examination-Activity Limitations Locomotion Level;Transfers;Stairs    Examination-Participation Restrictions Driving;Occupation;Community Activity;Yard Work    Stability/Clinical Decision Making Stable/Uncomplicated    Rehab Potential Excellent    PT Frequency 2x / week    PT Duration 12 weeks    PT Treatment/Interventions ADLs/Self Care Home Management;Aquatic Therapy;Biofeedback;Cryotherapy;Electrical Stimulation;Moist Heat;Traction;Ultrasound;Gait training;DME Instruction;Stair training;Functional mobility training;Therapeutic activities;Neuromuscular re-education;Balance training;Therapeutic exercise;Patient/family education;Manual techniques    PT Next Visit Plan re cert    PT Home Exercise Plan Provided Access Code: [email or web address]  URL: [email or web address]  Date: 10/11/2020  Prepared by: Rivka Barbara    Consulted and Agree with Plan of Care Patient             Patient will benefit from skilled therapeutic intervention in order to improve the following deficits and impairments:  Abnormal gait, Decreased activity tolerance, Decreased endurance, Impaired sensation, Improper body mechanics, Decreased balance, Decreased coordination, Decreased mobility, Decreased safety awareness, Difficulty walking  Visit Diagnosis: Muscle weakness (generalized)  Abnormality of gait and mobility  Difficulty in walking, not elsewhere classified  Unsteadiness on feet     Problem List Patient Active Problem List   Diagnosis Date Noted    Hyperlipidemia associated with type 2 diabetes mellitus ([place]) 12/23/2020   Muscle weakness (generalized) 12/05/2020   Abnormality of gait and mobility 12/05/2020   Unsteadiness on feet 12/05/2020   Other abnormalities of gait and mobility 12/05/2020   Difficulty in walking, not elsewhere classified 12/05/2020   Other lack of coordination 12/05/2020   Type 2 diabetes mellitus with neurological complications ([place]) 71/95/9747   Mixed hyperlipidemia 11/27/2020   Primary hypertension 11/27/2020   Vitamin D deficiency 10/04/2020   Mediastinal adenopathy 10/04/2020   Hyponatremia    Transaminitis    Diabetes mellitus, new onset ([place])    Malnutrition of moderate degree 09/13/2020   Stroke ([place]) 09/12/2020   Aortic thrombus ([place])    Hyperlipidemia    Essential hypertension    Impaired fasting glucose    Cerebellar stroke ([place]) 09/11/2020    Particia Lather, PT 12/26/2020, 12:54 PM  [place] MAIN [facility] SERVICES [address] [place], [place], [address] Phone: [phone]   Fax:  [phone]  Name: Edrick Whitehorn MRN: [id number] Date of Birth: 07-03-1960

## 2020-12-26 NOTE — Therapy (Signed)
[place] MAIN [facility] SERVICES [address] [place], [place], [address] Phone: [phone]   Fax:  [phone]  Occupational Therapy Treatment  Patient Details  Name: Malik Jones MRN: [id number] Date of Birth: 1960-12-25 Referring Provider (OT): Dr. Leeroy Cha   Encounter Date: 12/26/2020   OT End of Session - 12/26/20 1017     Visit Number 23    Number of Visits 24    Date for OT Re-Evaluation 12/30/20    Authorization Time Period Reporting period starting 11/11/2020    OT Start Time 0846    OT Stop Time 0929    OT Time Calculation (min) 43 min    Activity Tolerance Patient tolerated treatment well    Behavior During Therapy [facility] for tasks assessed/performed             Past Medical History:  Diagnosis Date   Diabetes mellitus without complication ([place])    Hyperlipidemia    Hypertension    Hyponatremia    Medical history non-contributory    Stroke [facility])     Past Surgical History:  Procedure Laterality Date   MANDIBLE FRACTURE SURGERY     NO PAST SURGERIES      There were no vitals filed for this visit.   Subjective Assessment - 12/26/20 1016     Subjective  "I started my walking yesterday.  I walked to and from the track and walked a mile at the track."    Pertinent History cerebellar stroke    Limitations balance, R sided weakness and ataxia    Patient Stated Goals "I want to get back to normal."    Currently in Pain? No/denies    Pain Score 0-No pain            Occupational Therapy Treatment: Neuro re-ed: Addressed FMC/GMC with picking up small stones and placing on top of upright jumbo pegs, practiced placing/removing stones without knocking down pegs; worked flipping pegs on table top, balancing stones on pegs when moving pegs around table.  75-80% accuracy with task with still occasional bumping and knocking over of pegs and stones.  Therapeutic Exercise: Facilitated hand strengthening with use of  hand gripper set at 28.9 # to remove jumbo pegs from pegboard x3 trials using R hand.  Worked Patent examiner, picking up multiple stones from table top with R hand, storing in hand, and discarding stones 1 at a time from palm of hand.  Response to Treatment: Pt continues to show improved control of [place] and GMC movements when engaging R hand.  Pt continues to report improved steadiness of R hand when eating or drinking with R hand.  Mild ataxia still noted throughout RUE; pt has occasional spilling of water when transporting cup with R hand, and pt occasionally knocks over near items when reaching for objects on table top.  Remaining visits will continue to focus on increasing control of FM and GM movements for improved accuracy and efficiency when engaging RUE in daily tasks.       OT Education - 12/26/20 1017     Education Details EC with IADLs    Person(s) Educated Patient    Methods Explanation;Verbal cues    Comprehension Verbalized understanding;Verbal cues required              OT Short Term Goals - 12/17/20 0947       OT SHORT TERM GOAL #1   Title Pt will be indep with HEP for RUE strength/coordination  Baseline Eval: HEP initiated at eval, will continue to add on; indep with HEP    Time 6    Period Weeks    Status Achieved    Target Date 11/18/20               OT Long Term Goals - 12/17/20 0948       OT LONG TERM GOAL #1   Title Pt will increase FOTO score to 60 or better to indicate increased functional performance.    Baseline Eval: FOTO 52; 11/08/20: FOTO score 67; 12/17/20: FOTO 61    Time 12    Period Weeks    Status On-going    Target Date 12/30/20      OT LONG TERM GOAL #2   Title Pt will increase R grip strength to 80 lbs or better to improve carrying heavier ADL supplies with R dominant hand.    Baseline Eval: R grip 65 lbs (L non-dominant 76 lbs); 11/08/20: R grip 79 lbs; 12/17/20: R grip 79 lbs (L 93 lbs)    Time 12    Period Weeks     Status Partially Met    Target Date 12/30/20      OT LONG TERM GOAL #3   Title Pt will improve ability to pick up and manipulate small ADL supplies without dropping with R dominant hand as demonstrated by improved 9 hole peg test score to <30 sec or better.    Baseline Eval: R 9 hole peg test 56.3 sec (L non-dominant 20.6 sec); 11/08/2020: 9 hole peg test 48 sec; 12/17/20: R 9 hole 35 secs (L 20 secs)    Time 12    Period Weeks    Status Partially Met    Target Date 12/30/20      OT LONG TERM GOAL #4   Title Pt will improve R GMC as demonstrated by ability to brush teeth, shave, eat with R dominant hand.    Baseline Eval: Pt using L non-dominant hand to brush teeth, shave, eat d/t moderate ataxia throughout RUE; 11/08/20: Pt still brushing teeth with L hand, son shaves him, but pt can eat finger foods with R hand.  Pt can self feed apple sauce with spoon in R hand with moderate ataxia, some spilling;; 12/17/20: Pt is now brushing teeth with some difficulty using R hand, pt shaves head with R hand, not yet shaving face (uses L hand), and eating with fair accuracy with R hand 75% of the time    Time 12    Period Weeks    Status On-going    Target Date 12/30/20      OT LONG TERM GOAL #5   Title Pt will tolerate at least 25 min of dynamic standing activity without rest break to build activity tolerance for return to work.    Baseline 12/17/20: Pt tolerates 15 min before rest break needed    Time 6    Period Weeks    Status New    Target Date 01/28/21              Plan - 12/26/20 1032     Clinical Impression Statement Pt continues to show improved control of [place] and GMC movements when engaging R hand.  Pt continues to report improved steadiness of R hand when eating or drinking with R hand.  Mild ataxia still noted throughout RUE; pt has occasional spilling of water when transporting cup with R hand, and pt occasionally knocks over near items when reaching  for objects on table top.   Remaining visits will continue to focus on increasing control of FM and GM movements for improved accuracy and efficiency when engaging RUE in daily tasks.    OT Occupational Profile and History Detailed Assessment- Review of Records and additional review of physical, cognitive, psychosocial history related to current functional performance    Occupational performance deficits (Please refer to evaluation for details): ADL's;IADL's;Leisure    Body Structure / Function / Physical Skills ADL;Dexterity;Strength;Balance;Coordination;FMC;IADL;Endurance;Sensation;UE functional use;GMC    Rehab Potential Excellent    Clinical Decision Making Several treatment options, min-mod task modification necessary    Comorbidities Affecting Occupational Performance: May have comorbidities impacting occupational performance    Modification or Assistance to Complete Evaluation  No modification of tasks or assist necessary to complete eval    OT Frequency 2x / week    OT Duration 12 weeks    OT Treatment/Interventions Self-care/ADL training;Therapeutic exercise;DME and/or AE instruction;Balance training;Neuromuscular education;Energy conservation;Therapeutic activities;Patient/family education;Coping strategies training    OT Home Exercise Plan theraputty increased to blue and instructed in exercises today    Consulted and Agree with Plan of Care Patient             Patient will benefit from skilled therapeutic intervention in order to improve the following deficits and impairments:   Body Structure / Function / Physical Skills: ADL, Dexterity, Strength, Balance, Coordination, FMC, IADL, Endurance, Sensation, UE functional use, GMC       Visit Diagnosis: Muscle weakness (generalized)  Other lack of coordination  Cerebellar stroke [facility])    Problem List Patient Active Problem List   Diagnosis Date Noted   Hyperlipidemia associated with type 2 diabetes mellitus ([place]) 12/23/2020   Muscle weakness  (generalized) 12/05/2020   Abnormality of gait and mobility 12/05/2020   Unsteadiness on feet 12/05/2020   Other abnormalities of gait and mobility 12/05/2020   Difficulty in walking, not elsewhere classified 12/05/2020   Other lack of coordination 12/05/2020   Type 2 diabetes mellitus with neurological complications ([place]) 37/11/6267   Mixed hyperlipidemia 11/27/2020   Primary hypertension 11/27/2020   Vitamin D deficiency 10/04/2020   Mediastinal adenopathy 10/04/2020   Hyponatremia    Transaminitis    Diabetes mellitus, new onset ([place])    Malnutrition of moderate degree 09/13/2020   Stroke ([place]) 09/12/2020   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

## 2020-12-27 ENCOUNTER — Encounter: Payer: Self-pay | Admitting: Adult Health

## 2020-12-27 NOTE — Telephone Encounter (Signed)
error 

## 2020-12-31 ENCOUNTER — Ambulatory Visit: Payer: BC Managed Care – PPO

## 2020-12-31 ENCOUNTER — Other Ambulatory Visit: Payer: Self-pay

## 2020-12-31 ENCOUNTER — Ambulatory Visit: Payer: BC Managed Care – PPO | Admitting: Physical Therapy

## 2020-12-31 DIAGNOSIS — I639 Cerebral infarction, unspecified: Secondary | ICD-10-CM | POA: Diagnosis not present

## 2020-12-31 DIAGNOSIS — M6281 Muscle weakness (generalized): Secondary | ICD-10-CM | POA: Diagnosis not present

## 2020-12-31 DIAGNOSIS — R262 Difficulty in walking, not elsewhere classified: Secondary | ICD-10-CM | POA: Diagnosis not present

## 2020-12-31 DIAGNOSIS — R2689 Other abnormalities of gait and mobility: Secondary | ICD-10-CM

## 2020-12-31 DIAGNOSIS — R269 Unspecified abnormalities of gait and mobility: Secondary | ICD-10-CM | POA: Diagnosis not present

## 2020-12-31 DIAGNOSIS — R278 Other lack of coordination: Secondary | ICD-10-CM | POA: Diagnosis not present

## 2020-12-31 DIAGNOSIS — R2681 Unsteadiness on feet: Secondary | ICD-10-CM | POA: Diagnosis not present

## 2020-12-31 NOTE — Therapy (Signed)
[place] MAIN [facility] SERVICES [address] [place], [place], [address] Phone: [phone]   Fax:  [phone]  Occupational Therapy Recert/Discharge  Patient Details  Name: Malik Jones MRN: [id number] Date of Birth: 1960-09-03 Referring Provider (OT): Dr. Leeroy Cha   Encounter Date: 12/31/2020   OT End of Session - 12/31/20 0911     Visit Number 24    Number of Visits 24    Date for OT Re-Evaluation 03/25/21    Authorization Time Period Reporting period starting 11/11/2020    OT Start Time 0845    OT Stop Time 0923    OT Time Calculation (min) 38 min    Activity Tolerance Patient tolerated treatment well    Behavior During Therapy [facility] for tasks assessed/performed             Past Medical History:  Diagnosis Date   Diabetes mellitus without complication ([place])    Hyperlipidemia    Hypertension    Hyponatremia    Medical history non-contributory    Stroke [facility])     Past Surgical History:  Procedure Laterality Date   MANDIBLE FRACTURE SURGERY     NO PAST SURGERIES      There were no vitals filed for this visit.   Subjective Assessment - 12/31/20 0909     Subjective  "I'm doing everything at home I need to do.  I'm using my R arm all the time."    Pertinent History cerebellar stroke    Limitations balance, R sided weakness and ataxia    Patient Stated Goals "I want to get back to normal."    Currently in Pain? No/denies    Pain Score 0-No pain    Multiple Pain Sites No             [facility] OT Assessment - 12/31/20 1239       Assessment   Medical Diagnosis CVA    Onset Date/Surgical Date 11/07/20    Hand Dominance Right      Observation/Other Assessments   Focus on Therapeutic Outcomes (FOTO)  70      Coordination   Gross Motor Movements are Fluid and Coordinated No   mild RUE ataxia   Fine Motor Movements are Fluid and Coordinated No   mild RUE ataxia   Right 9 Hole Peg Test 28 sec    Left 9 Hole  Peg Test 20 sec      Strength   Overall Strength Comments BUEs 5/5 shoulder/elbow/wrist      Hand Function   Right Hand Grip (lbs) 76    Right Hand Lateral Pinch 20 lbs    Right Hand 3 Point Pinch 15 lbs    Left Hand Grip (lbs) 91    Left Hand Lateral Pinch 24 lbs    Left 3 point pinch 17 lbs              Occupational Therapy Recert/Discharge: Pt has completed 24 OT visits to address RUE strength/coordination/functional use of R dominant arm for self care tasks.  Pt has made excellent gains with RUE function and is now consistently using RUE for self care tasks with improved efficiency and accuracy.  Pt has residual grip strength deficits as compared to L non-dominant hand, but pt can now use R dominant hand to grab a gallon of milk from the refrigerator and carry grocery bags without difficulty.  Pt reports that he is confident to use his R hand for self feeding and has  started shaving face with RUE.  OT has encouraged continued use of theraputty for grip strengthening, and pt to continue to regularly engage RUE for self care, allowing extra time as needed for residual ataxia.  Pt feels all goals sufficiently met and is ready for discharge today.     Therapeutic Exercise: Facilitated hand strengthening with use of hand gripper set at 28.9# to remove jumbo pegs from pegboard x3 trials using R hand.  Practiced hand dexterity exercises shuffling, dealing, flipping cards with focus on speed and accuracy using R hand.  Speed and accuracy [facility] for all card activities.  Objective measurements taken and goals updated for recert/discharge visit.  See note for details.    OT Education - 12/31/20 0910     Education Details HEP review for discharge    Person(s) Educated Patient    Methods Explanation;Verbal cues    Comprehension Verbalized understanding              OT Short Term Goals - 12/31/20 0859       OT SHORT TERM GOAL #1   Title Pt will be indep with HEP for RUE  strength/coordination    Baseline Eval: HEP initiated at eval, will continue to add on; indep with HEP    Time 6    Period Weeks    Status Achieved    Target Date 11/18/20               OT Long Term Goals - 12/31/20 0900       OT LONG TERM GOAL #1   Title Pt will increase FOTO score to 60 or better to indicate increased functional performance.    Baseline Eval: FOTO 52; 11/08/20: FOTO score 67; 12/17/20: FOTO 61; 12/31/20: FOTO score 70 at discharge    Time 12    Period Weeks    Status Achieved    Target Date 12/31/20      OT LONG TERM GOAL #2   Title Pt will increase R grip strength to 80 lbs or better to improve carrying heavier ADL supplies with R dominant hand.    Baseline Eval: R grip 65 lbs (L non-dominant 76 lbs); 11/08/20: R grip 79 lbs; 12/17/20: R grip 79 lbs (L 93 lbs); R grip 76 lbs (L 91) Able to carry gallon hold gallon of milk or grocery bags in R hand.    Time 12    Period Weeks    Status Partially Met    Target Date 12/31/20      OT LONG TERM GOAL #3   Title Pt will improve ability to pick up and manipulate small ADL supplies without dropping with R dominant hand as demonstrated by improved 9 hole peg test score to <30 sec or better.    Baseline Eval: R 9 hole peg test 56.3 sec (L non-dominant 20.6 sec); 11/08/2020: 9 hole peg test 48 sec; 12/17/20: R 9 hole 35 secs (L 20 secs); 12/31/20 R 9 hole 28 sec; pt can pick up small ADL supplies consistently with R hand (ie pills, coins)    Time 12    Period Weeks    Status Achieved    Target Date 12/31/20      OT LONG TERM GOAL #4   Title Pt will improve R GMC as demonstrated by ability to brush teeth, shave, eat with R dominant hand.    Baseline Eval: Pt using L non-dominant hand to brush teeth, shave, eat d/t moderate ataxia throughout RUE; 11/08/20: Pt still  brushing teeth with L hand, son shaves him, but pt can eat finger foods with R hand.  Pt can self feed apple sauce with spoon in R hand with moderate ataxia, some  spilling;; 12/17/20: Pt is now brushing teeth with some difficulty using R hand, pt shaves head with R hand, not yet shaving face (uses L hand), and eating with fair accuracy with R hand 75% of the time; 12/31/20: Pt able to use R hand to brush teeth, shaves with caution (head and face), and eats with R hand with mild ataxia    Time 12    Period Weeks    Status Achieved    Target Date 12/31/20      OT LONG TERM GOAL #5   Title Pt will tolerate at least 25 min of dynamic standing activity without rest break to build activity tolerance for return to work.    Baseline 12/17/20: Pt tolerates 15 min before rest break needed; 12/31/20: Pt can tolerate 30 min or more of walking daily    Time 6    Period Weeks    Status Achieved    Target Date 12/31/20              Plan - 12/31/20 1225     Clinical Impression Statement Pt has completed 24 OT visits to address RUE strength/coordination/functional use of R dominant arm for self care tasks.  Pt has made excellent gains with RUE function and is now consistently using RUE for self care tasks with improved efficiency and accuracy.  Pt has residual grip strength deficits as compared to L non-dominant hand, but pt can now use R dominant hand to grab a gallon of milk from the refrigerator and carry grocery bags without difficulty.  Pt reports that he is confident to use his R hand for self feeding and has started shaving face with RUE.  OT has encouraged continued use of theraputty for grip strengthening, and pt to continue to regularly engage RUE for self care, allowing extra time as needed for residual ataxia.  Pt feels all goals sufficiently met and is ready for discharge today.    OT Occupational Profile and History Detailed Assessment- Review of Records and additional review of physical, cognitive, psychosocial history related to current functional performance    Occupational performance deficits (Please refer to evaluation for details):  ADL's;IADL's;Leisure    Body Structure / Function / Physical Skills ADL;Dexterity;Strength;Balance;Coordination;FMC;IADL;Endurance;Sensation;UE functional use;GMC    Rehab Potential Excellent    Clinical Decision Making Several treatment options, min-mod task modification necessary    Comorbidities Affecting Occupational Performance: May have comorbidities impacting occupational performance    Modification or Assistance to Complete Evaluation  No modification of tasks or assist necessary to complete eval    OT Frequency Other (comment)   1 visit completed today in new cert period to complete discharge assessment   OT Duration Other (comment)   discharge today   OT Treatment/Interventions Therapeutic exercise;Neuromuscular education;Patient/family education;Self-care/ADL training    OT Home Exercise Plan continue with blue theraputty for gripping and pinch strengthening    Consulted and Agree with Plan of Care Patient             Patient will benefit from skilled therapeutic intervention in order to improve the following deficits and impairments:   Body Structure / Function / Physical Skills: ADL, Dexterity, Strength, Balance, Coordination, FMC, IADL, Endurance, Sensation, UE functional use, GMC       Visit Diagnosis: Muscle weakness (generalized)  Other  lack of coordination    Problem List Patient Active Problem List   Diagnosis Date Noted   Hyperlipidemia associated with type 2 diabetes mellitus ([place]) 12/23/2020   Muscle weakness (generalized) 12/05/2020   Abnormality of gait and mobility 12/05/2020   Unsteadiness on feet 12/05/2020   Other abnormalities of gait and mobility 12/05/2020   Difficulty in walking, not elsewhere classified 12/05/2020   Other lack of coordination 12/05/2020   Type 2 diabetes mellitus with neurological complications ([place]) 00/29/8473   Mixed hyperlipidemia 11/27/2020   Primary hypertension 11/27/2020   Vitamin D deficiency 10/04/2020    Mediastinal adenopathy 10/04/2020   Hyponatremia    Transaminitis    Diabetes mellitus, new onset ([place])    Malnutrition of moderate degree 09/13/2020   Stroke ([place]) 09/12/2020   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

## 2020-12-31 NOTE — Therapy (Signed)
[place] MAIN [facility] SERVICES [address] [place], [place], [address] Phone: [phone]   Fax:  [phone]  Physical Therapy Treatment/ Discharge Summary   Patient Details  Name: Malik Jones MRN: [id number] Date of Birth: [birth year] No data recorded  Encounter Date: 12/31/2020   PT End of Session - 12/31/20 0954     Visit Number 24    Number of Visits 24    Date for PT Re-Evaluation 12/31/20    Authorization - Number of Visits 24    Progress Note Due on Visit 68    PT Start Time 0807    PT Stop Time 0845    PT Time Calculation (min) 38 min    Equipment Utilized During Treatment Gait belt    Activity Tolerance Patient tolerated treatment well    Behavior During Therapy WFL for tasks assessed/performed             Past Medical History:  Diagnosis Date   Diabetes mellitus without complication ([place])    Hyperlipidemia    Hypertension    Hyponatremia    Medical history non-contributory    Stroke [facility])     Past Surgical History:  Procedure Laterality Date   MANDIBLE FRACTURE SURGERY     NO PAST SURGERIES      There were no vitals filed for this visit.   Subjective Assessment - 12/31/20 0811     Subjective Pt reports he has gained a lot of independence since the start of therapy. He has improved in his ability to ambulate longer distances and has been regularly completing track walking as part of his home program, reports walking over a mile at the track daily. Pt reports he is a little worried about returnign to work regarding stamina and hi sability to stand and complete upper extremity tasks for duration of workday.    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 Infarct occured on 09/11/20; no significant PMH. He attended inpatient rehab services at [facility] for 2  weeks and d/c from inpatient 10 days ago. He lived with his brother for 1 week before moving back into his own home. He is now living independently with abundant family check-ins. He is currently not driving or employed. He states his employer has had to post his job during this time. Pt reports 1 fall immediately after stroke, otherwise no history of falls. Only PT he has recieved has been post-infarct. Pt arrivied with RW however states he rarely uses it and does nto use it in the home.    Limitations House hold activities;Walking;Lifting;Writing    How long can you sit comfortably? unaffected    How long can you stand comfortably? states standing is not a problem    How long can you walk comfortably? states walking is not a problem    Patient Stated Goals return to work and driving, be fully independent as he was before cerebellar infarct                [facility] PT Assessment - 12/31/20 0001       Observation/Other Assessments   Focus on Therapeutic Outcomes (FOTO)  74      6 minute walk test results    Aerobic Endurance Distance Walked 1405      Standardized Balance Assessment   Standardized Balance Assessment Mini-BESTest;Dynamic Gait Index  Mini-BESTest   Sit To Stand Normal: Comes to stand without use of hands and stabilizes independently.    Rise to Toes Normal: Stable for 3 s with maximum height.    Stand on one leg (left) Normal: 20 s.    Stand on one leg (right) Moderate: < 20 s    Stand on one leg - lowest score 1    Compensatory Stepping Correction - Forward Normal: Recovers independently with a single, large step (second realignement is allowed).    Compensatory Stepping Correction - Backward Moderate: More than one step is required to recover equilibrium    Compensatory Stepping Correction - Left Lateral Normal: Recovers independently with 1 step (crossover or lateral OK)    Compensatory Stepping Correction - Right Lateral Severe:  Falls, or cannot step    Stepping  Corredtion Lateral - lowest score 0    Stance - Feet together, eyes open, firm surface  Normal: 30s    Stance - Feet together, eyes closed, foam surface  Normal: 30s    Incline - Eyes Closed Normal: Stands independently 30s and aligns with gravity    Change in Gait Speed Normal: Significantly changes walkling speed without imbalance    Walk with head turns - Horizontal Normal: performs head turns with no change in gait speed and good balance    Walk with pivot turns Normal: Turns with feet close FAST (< 3 steps) with good balance.    Step over obstacles Normal: Able to step over box with minimal change of gait speed and with good balance.    Timed UP & GO with Dual Task Normal: No noticeable change in sitting, standing or walking while backward counting when compared to TUG without    Mini-BEST total score 24      Timed Up and Go Test   Normal TUG (seconds) 7.3    Cognitive TUG (seconds) 8.8                  Physical therapy treatment session today consisted of completing assessment of goals and administration of testing as demonstrated in flow sheet. Addition treatments may be found below.                    PT Education - 12/31/20 0954     Education Details Patient educated to continue with walking program and continue to perform healthy eating habits as directed by his doctor.    Person(s) Educated Patient    Methods Explanation    Comprehension Verbalized understanding              PT Short Term Goals - 11/08/20 0808       PT SHORT TERM GOAL #1   Title Patient will be independent in home exercise program to improve strength/mobility for better functional independence with ADLs.    Time 6    Period Weeks    Status Achieved    Target Date 11/19/20               PT Long Term Goals - 12/31/20 0823       PT LONG TERM GOAL #1   Title Patient will increase FOTO score to equal to or greater than 77 to demonstrate statistically significant  improvement in mobility and quality of life.    Baseline 10/08/20: 63, 9/30: 67  12/31/20: 74    Time 12    Period Weeks    Status Partially Met      PT LONG TERM  GOAL #2   Title Pt will improve BERG by at least 3 points in order to demonstrate clinically significant improvement in balance.    Baseline 42 10/11/20    Time 8    Period Weeks    Status Achieved      PT LONG TERM GOAL #3   Title Patient will reduce timed up and go to <11 seconds to reduce fall risk and demonstrate improved transfer/gait ability.    Baseline 10/08/20: 12.68 seconds;10.3 sec    Time 12    Period Weeks    Status Achieved      PT LONG TERM GOAL #4   Title Patient will increase six minute walk test distance to >1000 for progression to community ambulator and improve gait ability    Baseline 10/08/20: 97f 12/31/20: 14063f   Time 12    Period Weeks    Status Achieved      PT LONG TERM GOAL #5   Title Patient will increase Functional Gait Assessment score to >23/30 as to reduce fall risk and improve dynamic gait safety with community ambulation.    Baseline 10/15/20: 14/30. 20/30 9/30, revised goal this session    Time 12    Period Weeks    Status Revised      PT LONG TERM GOAL #6   Title Pt will improve score on mini BEST test by 4 points or greater in order to indicate improved balance and decreased risk of falls.    Baseline to be assessed visit 11 10/3: 20/28    Time 7 [place] 12/31/20 0955     Clinical Impression Statement Patient presents to therapy today for a progress note and recertification.  Patient's goals were assessed and patient has met or nearly met all of his physical therapy goals.  Patient is no longer at risk for falls in the community setting his began walking for long distances in the community environment without significant difficulty.  Patient made great progress with physical therapy thus far has agreed to be discharged  with home exercise program at this time.  Patient made progress with his walking and was able to ambulate greater than 1400 feet and a 6-minute walk test also demonstrated significantly improved balance ability and mini best balance test session.  Patient also made significant progress with focus on therapeutic outcomes survey indicating improved confidence with functional tasks at home and in the community.    Personal Factors and Comorbidities Age;Comorbidity 1;Comorbidity 2    Comorbidities HTN , DM    Examination-Activity Limitations Locomotion Level;Transfers;Stairs    Examination-Participation Restrictions Driving;Occupation;Community Activity;Yard Work    Stability/Clinical Decision Making Stable/Uncomplicated    Rehab Potential Excellent    PT Frequency 2x / week    PT Duration 12 weeks    PT Treatment/Interventions ADLs/Self Care Home Management;Aquatic Therapy;Biofeedback;Cryotherapy;Electrical Stimulation;Moist Heat;Traction;Ultrasound;Gait training;DME Instruction;Stair training;Functional mobility training;Therapeutic activities;Neuromuscular re-education;Balance training;Therapeutic exercise;Patient/family education;Manual techniques    PT Next Visit Plan re cert    PT Home Exercise Plan Provided Access Code: [email or web address]: [email or web address]  Date: 10/11/2020  Prepared by: ChRivka Barbara  Consulted and Agree with Plan of Care Patient             Patient will benefit from skilled therapeutic intervention in order to improve the following deficits and impairments:  Abnormal gait, Decreased  activity tolerance, Decreased endurance, Impaired sensation, Improper body mechanics, Decreased balance, Decreased coordination, Decreased mobility, Decreased safety awareness, Difficulty walking  Visit Diagnosis: Abnormality of gait and mobility  Difficulty in walking, not elsewhere classified  Other abnormalities of gait and mobility     Problem List Patient  Active Problem List   Diagnosis Date Noted   Hyperlipidemia associated with type 2 diabetes mellitus ([place]) 12/23/2020   Muscle weakness (generalized) 12/05/2020   Abnormality of gait and mobility 12/05/2020   Unsteadiness on feet 12/05/2020   Other abnormalities of gait and mobility 12/05/2020   Difficulty in walking, not elsewhere classified 12/05/2020   Other lack of coordination 12/05/2020   Type 2 diabetes mellitus with neurological complications ([place]) 07/62/2633   Mixed hyperlipidemia 11/27/2020   Primary hypertension 11/27/2020   Vitamin D deficiency 10/04/2020   Mediastinal adenopathy 10/04/2020   Hyponatremia    Transaminitis    Diabetes mellitus, new onset ([place])    Malnutrition of moderate degree 09/13/2020   Stroke ([place]) 09/12/2020   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

## 2021-01-06 ENCOUNTER — Ambulatory Visit (INDEPENDENT_AMBULATORY_CARE_PROVIDER_SITE_OTHER): Payer: BC Managed Care – PPO | Admitting: Internal Medicine

## 2021-01-06 ENCOUNTER — Encounter: Payer: Self-pay | Admitting: Internal Medicine

## 2021-01-06 ENCOUNTER — Other Ambulatory Visit: Payer: Self-pay

## 2021-01-06 VITALS — BP 137/87 | HR 75 | Temp 97.9°F | Ht 64.96 in | Wt 151.8 lb

## 2021-01-06 DIAGNOSIS — E119 Type 2 diabetes mellitus without complications: Secondary | ICD-10-CM | POA: Diagnosis not present

## 2021-01-06 DIAGNOSIS — I10 Essential (primary) hypertension: Secondary | ICD-10-CM

## 2021-01-06 NOTE — Progress Notes (Signed)
BP 137/87   Pulse 75   Temp 97.9 F (36.6 C) (Oral)   Ht 5' 4.96" (1.65 m)   Wt 151 lb 12.8 oz (68.9 kg)   SpO2 100%   BMI 25.29 kg/m    Subjective:    Patient ID: Malik Jones, male    DOB: [birth year], 60 y.o.   MRN: [id number]  Chief Complaint  Patient presents with   Hypertension    Patient has BP log for the past 2 weeks as requested.     HPI: Malik Jones is a 60 y.o. male  Pt has a ho CVA Htn , here for a follow up patient's this morning. Presents home logs which look within normal limits highest blood pressure recorded was 126/80 mmHg of note patient had her recent CVA in August and has a left-sided this abnormal gait such.  Hypertension This is a chronic problem. The current episode started more than 1 year ago. The problem is controlled. Pertinent negatives include no anxiety, blurred vision, chest pain, headaches, malaise/fatigue, neck pain, orthopnea, palpitations, peripheral edema, PND, shortness of breath or sweats.   Chief Complaint  Patient presents with   Hypertension    Patient has BP log for the past 2 weeks as requested.     Relevant past medical, surgical, family and social history reviewed and updated as indicated. Interim medical history since our last visit reviewed. Allergies and medications reviewed and updated.  Review of Systems  Constitutional:  Negative for malaise/fatigue.  Eyes:  Negative for blurred vision.  Respiratory:  Negative for shortness of breath.   Cardiovascular:  Negative for chest pain, palpitations, orthopnea and PND.  Musculoskeletal:  Negative for neck pain.  Neurological:  Negative for headaches.   Per HPI unless specifically indicated above     Objective:    BP 137/87   Pulse 75   Temp 97.9 F (36.6 C) (Oral)   Ht 5' 4.96" (1.65 m)   Wt 151 lb 12.8 oz (68.9 kg)   SpO2 100%   BMI 25.29 kg/m   Wt Readings from Last 3 Encounters:  01/06/21 151 lb 12.8 oz (68.9 kg)  12/23/20 150 lb 3.2 oz (68.1 kg)   11/27/20 150 lb 3.2 oz (68.1 kg)    Physical Exam Vitals and nursing note reviewed.  Constitutional:      General: He is not in acute distress.    Appearance: Normal appearance. He is not ill-appearing or diaphoretic.  HENT:     Head: Normocephalic and atraumatic.     Right Ear: Tympanic membrane and external ear normal. There is no impacted cerumen.     Left Ear: External ear normal.     Nose: No congestion or rhinorrhea.     Mouth/Throat:     Pharynx: No oropharyngeal exudate or posterior oropharyngeal erythema.  Eyes:     Conjunctiva/sclera: Conjunctivae normal.     Pupils: Pupils are equal, round, and reactive to light.  Cardiovascular:     Rate and Rhythm: Normal rate and regular rhythm.     Heart sounds: No murmur heard.   No friction rub. No gallop.  Pulmonary:     Effort: No respiratory distress.     Breath sounds: No stridor. No wheezing or rhonchi.  Chest:     Chest wall: No tenderness.  Abdominal:     General: Abdomen is flat. Bowel sounds are normal.     Palpations: Abdomen is soft. There is no mass.     Tenderness: There is no  abdominal tenderness.  Musculoskeletal:     Cervical back: Normal range of motion and neck supple. No rigidity or tenderness.     Left lower leg: No edema.  Skin:    General: Skin is warm and dry.  Neurological:     Mental Status: He is alert.    Results for orders placed or performed in visit on 11/27/20  Bayer DCA Hb A1c Waived  Result Value Ref Range   HB A1C (BAYER DCA - WAIVED) 6.4 (H) 4.8 - 5.6 %  Lipid panel  Result Value Ref Range   Cholesterol, Total 153 100 - 199 mg/dL   Triglycerides 145 0 - 149 mg/dL   HDL 43 >39 mg/dL   VLDL Cholesterol Cal 25 5 - 40 mg/dL   LDL Chol Calc (NIH) 85 0 - 99 mg/dL   Chol/HDL Ratio 3.6 0.0 - 5.0 ratio  Vitamin B12  Result Value Ref Range   Vitamin B-12 371 232 - 1,245 pg/mL  Urinalysis, Routine w reflex microscopic  Result Value Ref Range   Specific Gravity, UA 1.015 1.005 - 1.030    pH, UA 6.5 5.0 - 7.5   Color, UA Yellow Yellow   Appearance Ur Clear Clear   Leukocytes,UA Negative Negative   Protein,UA Negative Negative/Trace   Glucose, UA Negative Negative   Ketones, UA Negative Negative   RBC, UA Negative Negative   Bilirubin, UA Negative Negative   Urobilinogen, Ur 0.2 0.2 - 1.0 mg/dL   Nitrite, UA Negative Negative  CBC with Differential/Platelet  Result Value Ref Range   WBC 6.8 3.4 - 10.8 x10E3/uL   RBC 4.27 4.14 - 5.80 x10E6/uL   Hemoglobin 13.3 13.0 - 17.7 g/dL   Hematocrit 39.7 37.5 - 51.0 %   MCV 93 79 - 97 fL   MCH 31.1 26.6 - 33.0 pg   MCHC 33.5 31.5 - 35.7 g/dL   RDW 12.9 11.6 - 15.4 %   Platelets 375 150 - 450 x10E3/uL   Neutrophils 67 Not Estab. %   Lymphs 20 Not Estab. %   Monocytes 10 Not Estab. %   Eos 2 Not Estab. %   Basos 1 Not Estab. %   Neutrophils Absolute 4.5 1.4 - 7.0 x10E3/uL   Lymphocytes Absolute 1.4 0.7 - 3.1 x10E3/uL   Monocytes Absolute 0.7 0.1 - 0.9 x10E3/uL   EOS (ABSOLUTE) 0.1 0.0 - 0.4 x10E3/uL   Basophils Absolute 0.1 0.0 - 0.2 x10E3/uL   Immature Granulocytes 0 Not Estab. %   Immature Grans (Abs) 0.0 0.0 - 0.1 x10E3/uL  Comprehensive metabolic panel  Result Value Ref Range   Glucose 110 (H) 70 - 99 mg/dL   BUN 12 6 - 24 mg/dL   Creatinine, Ser 0.95 0.76 - 1.27 mg/dL   eGFR 93 >59 mL/min/1.73   BUN/Creatinine Ratio 13 9 - 20   Sodium 137 134 - 144 mmol/L   Potassium 4.2 3.5 - 5.2 mmol/L   Chloride 97 96 - 106 mmol/L   CO2 22 20 - 29 mmol/L   Calcium 9.9 8.7 - 10.2 mg/dL   Total Protein 8.3 6.0 - 8.5 g/dL   Albumin 5.1 (H) 3.8 - 4.9 g/dL   Globulin, Total 3.2 1.5 - 4.5 g/dL   Albumin/Globulin Ratio 1.6 1.2 - 2.2   Bilirubin Total 0.4 0.0 - 1.2 mg/dL   Alkaline Phosphatase 66 44 - 121 IU/L   AST 20 0 - 40 IU/L   ALT 29 0 - 44 IU/L  Current Outpatient Medications:    amLODipine (NORVASC) 10 MG tablet, Take 1 tablet (10 mg total) by mouth daily., Disp: 30 tablet, Rfl: 1   atorvastatin (LIPITOR)  80 MG tablet, Take 1 tablet (80 mg total) by mouth daily., Disp: 90 tablet, Rfl: 1   ELIQUIS 5 MG TABS tablet, Take 1 tablet by mouth twice daily, Disp: 60 tablet, Rfl: 0   hydrochlorothiazide (MICROZIDE) 12.5 MG capsule, Take 1 capsule (12.5 mg total) by mouth daily., Disp: 30 capsule, Rfl: 3   metFORMIN (GLUCOPHAGE) 500 MG tablet, Take 1 tablet (500 mg total) by mouth daily with breakfast., Disp: 30 tablet, Rfl: 3   atorvastatin (LIPITOR) 40 MG tablet, Take 40 mg by mouth daily., Disp: , Rfl:     Assessment & Plan:  HLD  Is on lipitor 40 mg  recheck FLP, check LFT's work on diet, SE of meds explained to pt. low fat and high fiber diet explained to pt.  2. Dm is on metformin 500 mg daily. A1c 6.4  check HbA1c,  urine  microalbumin  diabetic diet plan given to pt  adviced regarding hypoglycemia and instructions given to pt today on how to prevent and treat the same if it were to occur. pt acknowledges the plan and voices understanding of the same.  exercise plan given and encouraged.   advice diabetic yearly podiatry, ophthalmology , nutritionist , dental check q 6 months  3. HTN :  Is on amlodipine/ HCTZ 12.5 mg  Continue current meds.  Medication compliance emphasised. pt advised to keep Bp logs. Pt verbalised understanding of the same. Pt to have a low salt diet . Exercise to reach a goal of at least 150 mins a week.  lifestyle modifications explained and pt understands importance of the above.   Problem List Items Addressed This Visit   None    Orders Placed This Encounter  Procedures   Bayer DCA Hb A1c Waived   Lipid Panel Piccol[place], Waived   Comprehensive metabolic panel     No orders of the defined types were placed in this encounter.    Follow up plan: No follow-ups on file.

## 2021-01-07 ENCOUNTER — Other Ambulatory Visit: Payer: Self-pay | Admitting: Physical Medicine and Rehabilitation

## 2021-01-07 ENCOUNTER — Encounter: Payer: Self-pay | Admitting: Internal Medicine

## 2021-01-07 ENCOUNTER — Ambulatory Visit: Payer: BC Managed Care – PPO | Admitting: Physical Therapy

## 2021-01-08 ENCOUNTER — Encounter: Payer: Self-pay | Admitting: Physical Medicine and Rehabilitation

## 2021-01-08 ENCOUNTER — Telehealth: Payer: Self-pay | Admitting: *Deleted

## 2021-01-08 NOTE — Telephone Encounter (Signed)
Notified letter sent through my chart and we will mail a signed copy.

## 2021-01-08 NOTE — Telephone Encounter (Signed)
Malik Jones called because he is in need of a letter stating he is out of work with his medical condition. He is behind on bills and child support and needs asap.Please advise.

## 2021-01-09 ENCOUNTER — Ambulatory Visit: Payer: BC Managed Care – PPO | Admitting: Physical Therapy

## 2021-01-14 ENCOUNTER — Ambulatory Visit: Payer: BC Managed Care – PPO | Admitting: Physical Therapy

## 2021-01-16 ENCOUNTER — Ambulatory Visit: Payer: BC Managed Care – PPO | Admitting: Physical Therapy

## 2021-01-20 ENCOUNTER — Other Ambulatory Visit: Payer: Self-pay | Admitting: Physical Medicine and Rehabilitation

## 2021-01-21 ENCOUNTER — Ambulatory Visit: Payer: BC Managed Care – PPO | Admitting: Physical Therapy

## 2021-01-23 ENCOUNTER — Ambulatory Visit: Payer: BC Managed Care – PPO | Admitting: Physical Therapy

## 2021-01-27 ENCOUNTER — Other Ambulatory Visit: Payer: Self-pay

## 2021-01-27 ENCOUNTER — Ambulatory Visit (INDEPENDENT_AMBULATORY_CARE_PROVIDER_SITE_OTHER): Payer: BC Managed Care – PPO | Admitting: Internal Medicine

## 2021-01-27 ENCOUNTER — Encounter: Payer: Self-pay | Admitting: Internal Medicine

## 2021-01-27 VITALS — BP 125/76 | HR 93 | Temp 98.7°F | Ht 64.96 in | Wt 151.2 lb

## 2021-01-27 DIAGNOSIS — I639 Cerebral infarction, unspecified: Secondary | ICD-10-CM | POA: Diagnosis not present

## 2021-01-27 DIAGNOSIS — E119 Type 2 diabetes mellitus without complications: Secondary | ICD-10-CM | POA: Diagnosis not present

## 2021-01-27 DIAGNOSIS — Z1211 Encounter for screening for malignant neoplasm of colon: Secondary | ICD-10-CM | POA: Diagnosis not present

## 2021-01-27 DIAGNOSIS — Z23 Encounter for immunization: Secondary | ICD-10-CM | POA: Diagnosis not present

## 2021-01-27 NOTE — Progress Notes (Signed)
BP 125/76    Pulse 93    Temp 98.7 F (37.1 C) (Oral)    Ht 5' 4.96" (1.65 m)    Wt 151 lb 3.2 oz (68.6 kg)    SpO2 98%    BMI 25.19 kg/m    Subjective:    Patient ID: Malik Jones, male    DOB: 1960/11/10, 61 y.o.   MRN: [id number]  Chief Complaint  Patient presents with   Hypertension   Diabetes    HPI: Malik Jones is a 60 y.o. male  Hypertension This is a chronic (pt is here for a follow up.) problem. The problem is controlled. Pertinent negatives include no anxiety, blurred vision, chest pain, headaches, malaise/fatigue, neck pain, orthopnea, palpitations, peripheral edema, shortness of breath or sweats.  Diabetes He presents for his follow-up (a1c 6.4) diabetic visit. He has type 2 diabetes mellitus. Pertinent negatives for hypoglycemia include no headaches or sweats. Pertinent negatives for diabetes include no blurred vision, no chest pain, no foot ulcerations, no polydipsia, no visual change and no weakness.   Chief Complaint  Patient presents with   Hypertension   Diabetes    Relevant past medical, surgical, family and social history reviewed and updated as indicated. Interim medical history since our last visit reviewed. Allergies and medications reviewed and updated.  Review of Systems  Constitutional:  Negative for malaise/fatigue.  Eyes:  Negative for blurred vision.  Respiratory:  Negative for shortness of breath.   Cardiovascular:  Negative for chest pain, palpitations and orthopnea.  Endocrine: Negative for polydipsia.  Musculoskeletal:  Negative for neck pain.  Neurological:  Negative for weakness and headaches.   Per HPI unless specifically indicated above     Objective:    BP 125/76    Pulse 93    Temp 98.7 F (37.1 C) (Oral)    Ht 5' 4.96" (1.65 m)    Wt 151 lb 3.2 oz (68.6 kg)    SpO2 98%    BMI 25.19 kg/m   Wt Readings from Last 3 Encounters:  01/27/21 151 lb 3.2 oz (68.6 kg)  01/06/21 151 lb 12.8 oz (68.9 kg)  12/23/20 150 lb 3.2 oz  (68.1 kg)    Physical Exam Vitals and nursing note reviewed.  Constitutional:      General: He is not in acute distress.    Appearance: Normal appearance. He is not ill-appearing or diaphoretic.  HENT:     Head: Normocephalic and atraumatic.     Right Ear: Tympanic membrane and external ear normal. There is no impacted cerumen.     Left Ear: External ear normal.     Nose: No congestion or rhinorrhea.     Mouth/Throat:     Pharynx: No oropharyngeal exudate or posterior oropharyngeal erythema.  Eyes:     Conjunctiva/sclera: Conjunctivae normal.     Pupils: Pupils are equal, round, and reactive to light.  Cardiovascular:     Rate and Rhythm: Normal rate and regular rhythm.     Heart sounds: No murmur heard.   No friction rub. No gallop.  Pulmonary:     Effort: No respiratory distress.     Breath sounds: No stridor. No wheezing or rhonchi.  Chest:     Chest wall: No tenderness.  Abdominal:     General: Abdomen is flat. Bowel sounds are normal.     Palpations: Abdomen is soft. There is no mass.     Tenderness: There is no abdominal tenderness.  Musculoskeletal:     Cervical  back: Normal range of motion and neck supple. No rigidity or tenderness.     Left lower leg: No edema.  Skin:    General: Skin is warm and dry.  Neurological:     Mental Status: He is alert.    Results for orders placed or performed in visit on 11/27/20  Bayer DCA Hb A1c Waived  Result Value Ref Range   HB A1C (BAYER DCA - WAIVED) 6.4 (H) 4.8 - 5.6 %  Lipid panel  Result Value Ref Range   Cholesterol, Total 153 100 - 199 mg/dL   Triglycerides 145 0 - 149 mg/dL   HDL 43 >39 mg/dL   VLDL Cholesterol Cal 25 5 - 40 mg/dL   LDL Chol Calc (NIH) 85 0 - 99 mg/dL   Chol/HDL Ratio 3.6 0.0 - 5.0 ratio  Vitamin B12  Result Value Ref Range   Vitamin B-12 371 232 - 1,245 pg/mL  Urinalysis, Routine w reflex microscopic  Result Value Ref Range   Specific Gravity, UA 1.015 1.005 - 1.030   pH, UA 6.5 5.0 - 7.5    Color, UA Yellow Yellow   Appearance Ur Clear Clear   Leukocytes,UA Negative Negative   Protein,UA Negative Negative/Trace   Glucose, UA Negative Negative   Ketones, UA Negative Negative   RBC, UA Negative Negative   Bilirubin, UA Negative Negative   Urobilinogen, Ur 0.2 0.2 - 1.0 mg/dL   Nitrite, UA Negative Negative  CBC with Differential/Platelet  Result Value Ref Range   WBC 6.8 3.4 - 10.8 x10E3/uL   RBC 4.27 4.14 - 5.80 x10E6/uL   Hemoglobin 13.3 13.0 - 17.7 g/dL   Hematocrit 39.7 37.5 - 51.0 %   MCV 93 79 - 97 fL   MCH 31.1 26.6 - 33.0 pg   MCHC 33.5 31.5 - 35.7 g/dL   RDW 12.9 11.6 - 15.4 %   Platelets 375 150 - 450 x10E3/uL   Neutrophils 67 Not Estab. %   Lymphs 20 Not Estab. %   Monocytes 10 Not Estab. %   Eos 2 Not Estab. %   Basos 1 Not Estab. %   Neutrophils Absolute 4.5 1.4 - 7.0 x10E3/uL   Lymphocytes Absolute 1.4 0.7 - 3.1 x10E3/uL   Monocytes Absolute 0.7 0.1 - 0.9 x10E3/uL   EOS (ABSOLUTE) 0.1 0.0 - 0.4 x10E3/uL   Basophils Absolute 0.1 0.0 - 0.2 x10E3/uL   Immature Granulocytes 0 Not Estab. %   Immature Grans (Abs) 0.0 0.0 - 0.1 x10E3/uL  Comprehensive metabolic panel  Result Value Ref Range   Glucose 110 (H) 70 - 99 mg/dL   BUN 12 6 - 24 mg/dL   Creatinine, Ser 0.95 0.76 - 1.27 mg/dL   eGFR 93 >59 mL/min/1.73   BUN/Creatinine Ratio 13 9 - 20   Sodium 137 134 - 144 mmol/L   Potassium 4.2 3.5 - 5.2 mmol/L   Chloride 97 96 - 106 mmol/L   CO2 22 20 - 29 mmol/L   Calcium 9.9 8.7 - 10.2 mg/dL   Total Protein 8.3 6.0 - 8.5 g/dL   Albumin 5.1 (H) 3.8 - 4.9 g/dL   Globulin, Total 3.2 1.5 - 4.5 g/dL   Albumin/Globulin Ratio 1.6 1.2 - 2.2   Bilirubin Total 0.4 0.0 - 1.2 mg/dL   Alkaline Phosphatase 66 44 - 121 IU/L   AST 20 0 - 40 IU/L   ALT 29 0 - 44 IU/L        Current Outpatient Medications:    amLODipine (  NORVASC) 10 MG tablet, Take 1 tablet (10 mg total) by mouth daily., Disp: 30 tablet, Rfl: 1   atorvastatin (LIPITOR) 80 MG tablet, Take 1  tablet (80 mg total) by mouth daily., Disp: 90 tablet, Rfl: 1   ELIQUIS 5 MG TABS tablet, Take 1 tablet by mouth twice daily, Disp: 60 tablet, Rfl: 0   hydrochlorothiazide (MICROZIDE) 12.5 MG capsule, Take 1 capsule (12.5 mg total) by mouth daily., Disp: 30 capsule, Rfl: 3   metFORMIN (GLUCOPHAGE) 500 MG tablet, Take 1 tablet (500 mg total) by mouth daily with breakfast., Disp: 30 tablet, Rfl: 3    Assessment & Plan:  HTN :  Continue current meds.  Medication compliance emphasised. pt advised to keep Bp logs. Pt verbalised understanding of the same. Pt to have a low salt diet . Exercise to reach a goal of at least 150 mins a week.  lifestyle modifications explained and pt understands importance of the above.   2. CVA cerebellar stroke   3. HLD :recheck FLP, check LFT's work on diet, SE of meds explained to pt. low fat and high fiber diet explained to pt.  4. DM  Stable recheck a1c.   urine  microalbumin  diabetic diet plan given to pt  adviced regarding hypoglycemia and instructions given to pt today on how to prevent and treat the same if it were to occur. pt acknowledges the plan and voices understanding of the same.  exercise plan given and encouraged.   advice diabetic yearly podiatry, ophthalmology , nutritionist , dental check q 6 months,  Problem List Items Addressed This Visit   None Visit Diagnoses     Screen for colon cancer    -  Primary   Relevant Orders   Ambulatory referral to Gastroenterology   Need for influenza vaccination       Relevant Orders   Flu Vaccine QUAD 12moIM (Fluarix, Fluzone & Alfiuria Quad PF)        Orders Placed This Encounter  Procedures   Flu Vaccine QUAD 625moM (Fluarix, Fluzone & Alfiuria Quad PF)   Ambulatory referral to Gastroenterology     No orders of the defined types were placed in this encounter.    Follow up plan: No follow-ups on file.

## 2021-01-28 ENCOUNTER — Ambulatory Visit: Payer: BC Managed Care – PPO | Admitting: Physical Therapy

## 2021-01-30 ENCOUNTER — Ambulatory Visit: Payer: BC Managed Care – PPO | Admitting: Physical Therapy

## 2021-02-04 ENCOUNTER — Ambulatory Visit: Payer: BC Managed Care – PPO | Admitting: Physical Therapy

## 2021-02-04 ENCOUNTER — Other Ambulatory Visit: Payer: Self-pay

## 2021-02-04 DIAGNOSIS — Z1211 Encounter for screening for malignant neoplasm of colon: Secondary | ICD-10-CM

## 2021-02-04 MED ORDER — PEG 3350-KCL-NA BICARB-NACL 420 G PO SOLR: 4000.0000 mL | Freq: Once | ORAL | 0 refills | Status: AC

## 2021-02-04 NOTE — Progress Notes (Signed)
Gastroenterology Pre-Procedure Review  Request Date: 03/06/2021 Requesting Physician: Dr. Allen Norris   PATIENT REVIEW QUESTIONS: The patient responded to the following health history questions as indicated:    1. Are you having any GI issues? no 2. Do you have a personal history of Polyps? no 3. Do you have a family history of Colon Cancer or Polyps? no 4. Diabetes Mellitus? yes (Type II) 5. Joint replacements in the past 12 months?no 6. Major health problems in the past 3 months?yes (Cerebellar Stroke) 7. Any artificial heart valves, MVP, or defibrillator?no    MEDICATIONS & ALLERGIES:    Patient reports the following regarding taking any anticoagulation/antiplatelet therapy:   Plavix, Coumadin, Eliquis, Xarelto, Lovenox, Pradaxa, Brilinta, or Effient? yes (Eliquis 5 mg) Aspirin? no  Patient confirms/reports the following medications:  Current Outpatient Medications  Medication Sig Dispense Refill   amLODipine (NORVASC) 10 MG tablet Take 1 tablet (10 mg total) by mouth daily. 30 tablet 1   atorvastatin (LIPITOR) 80 MG tablet Take 1 tablet (80 mg total) by mouth daily. 90 tablet 1   ELIQUIS 5 MG TABS tablet Take 1 tablet by mouth twice daily 60 tablet 0   hydrochlorothiazide (MICROZIDE) 12.5 MG capsule Take 1 capsule (12.5 mg total) by mouth daily. 30 capsule 3   metFORMIN (GLUCOPHAGE) 500 MG tablet Take 1 tablet (500 mg total) by mouth daily with breakfast. 30 tablet 3   No current facility-administered medications for this visit.    Patient confirms/reports the following allergies:  No Known Allergies  No orders of the defined types were placed in this encounter.   AUTHORIZATION INFORMATION Primary Insurance: 1D#: Group #:  Secondary Insurance: 1D#: Group #:  SCHEDULE INFORMATION: Date: 03/06/2021 Time: Location: [place]

## 2021-02-06 ENCOUNTER — Ambulatory Visit: Payer: BC Managed Care – PPO

## 2021-02-11 ENCOUNTER — Ambulatory Visit: Payer: BC Managed Care – PPO | Admitting: Physical Therapy

## 2021-02-12 ENCOUNTER — Other Ambulatory Visit: Payer: Self-pay

## 2021-02-12 ENCOUNTER — Encounter: Payer: Self-pay | Admitting: Adult Health

## 2021-02-12 ENCOUNTER — Ambulatory Visit (INDEPENDENT_AMBULATORY_CARE_PROVIDER_SITE_OTHER): Payer: BC Managed Care – PPO | Admitting: Adult Health

## 2021-02-12 ENCOUNTER — Other Ambulatory Visit: Payer: Self-pay | Admitting: Internal Medicine

## 2021-02-12 ENCOUNTER — Telehealth: Payer: Self-pay | Admitting: Adult Health

## 2021-02-12 ENCOUNTER — Other Ambulatory Visit: Payer: Self-pay | Admitting: Physical Medicine and Rehabilitation

## 2021-02-12 VITALS — BP 137/84 | HR 75 | Ht 65.0 in | Wt 153.0 lb

## 2021-02-12 DIAGNOSIS — I639 Cerebral infarction, unspecified: Secondary | ICD-10-CM | POA: Diagnosis not present

## 2021-02-12 DIAGNOSIS — I7410 Embolism and thrombosis of unspecified parts of aorta: Secondary | ICD-10-CM | POA: Diagnosis not present

## 2021-02-12 NOTE — Patient Instructions (Addendum)
I will keep you updated regarding disability  Continue Eliquis (apixaban) daily  and atorvastatin  for secondary stroke prevention  I will keep you updated about the ongoing use of Eliquis  Continue to follow up with PCP regarding cholesterol, blood pressure and diabetes management  Maintain strict control of hypertension with blood pressure goal below 130/90, diabetes with hemoglobin A1c goal below 7.0 % and cholesterol with LDL cholesterol (bad cholesterol) goal below 70 mg/dL.   Signs of a Stroke? Follow the BEFAST method:  Balance Watch for a sudden loss of balance, trouble with coordination or vertigo Eyes Is there a sudden loss of vision in one or both eyes? Or double vision?  Face: Ask the person to smile. Does one side of the face droop or is it numb?  Arms: Ask the person to raise both arms. Does one arm drift downward? Is there weakness or numbness of a leg? Speech: Ask the person to repeat a simple phrase. Does the speech sound slurred/strange? Is the person confused ? Time: If you observe any of these signs, call 911.     Followup in the future with me in 4 months or call earlier if needed       Thank you for coming to see [place] at [facility]. I hope we have been able to provide you high quality care today.  You may receive a patient satisfaction survey over the next few weeks. We would appreciate your feedback and comments so that we may continue to improve ourselves and the health of our patients.

## 2021-02-12 NOTE — Telephone Encounter (Signed)
Requested Prescriptions  Pending Prescriptions Disp Refills   atorvastatin (LIPITOR) 80 MG tablet [Pharmacy Med Name: Atorvastatin Calcium 80 MG Oral Tablet] 90 tablet 1    Sig: Take 1 tablet by mouth once daily     Cardiovascular:  Antilipid - Statins Passed - 02/12/2021  9:19 AM      Passed - Total Cholesterol in normal range and within 360 days    Cholesterol, Total  Date Value Ref Range Status  11/27/2020 153 100 - 199 mg/dL Final         Passed - LDL in normal range and within 360 days    LDL Chol Calc (NIH)  Date Value Ref Range Status  11/27/2020 85 0 - 99 mg/dL Final         Passed - HDL in normal range and within 360 days    HDL  Date Value Ref Range Status  11/27/2020 43 >39 mg/dL Final         Passed - Triglycerides in normal range and within 360 days    Triglycerides  Date Value Ref Range Status  11/27/2020 145 0 - 149 mg/dL Final         Passed - Patient is not pregnant      Passed - Valid encounter within last 12 months    Recent Outpatient Visits          2 weeks ago Screen for colon cancer   [facility] Vigg, Avanti, MD   1 month ago Essential hypertension   [place] Vigg, Avanti, MD   1 month ago Difficulty in walking, not elsewhere classified   [facility] Vigg, Avanti, MD   2 months ago Cerebrovascular accident (CVA), unspecified mechanism ([place])   [facility] Vigg, Avanti, MD      Future Appointments            In 1 month Vigg, Avanti, MD [place]   In 2 months Vigg, Avanti, MD [place]            metFORMIN (GLUCOPHAGE) 500 MG tablet [Pharmacy Med Name: metFORMIN HCl 500 MG Oral Tablet] 90 tablet 1    Sig: Take 1 tablet by mouth once daily with breakfast     Endocrinology:  Diabetes - Biguanides Failed - 02/12/2021  9:19 AM      Failed - AA eGFR in normal range and within 360 days    GFR, Estimated  Date Value Ref Range Status  09/25/2020 >60  >60 mL/min Final    Comment:    (NOTE) Calculated using the CKD-EPI Creatinine Equation (2021)    eGFR  Date Value Ref Range Status  11/27/2020 93 >59 mL/min/1.73 Final         Passed - Cr in normal range and within 360 days    Creatinine, Ser  Date Value Ref Range Status  11/27/2020 0.95 0.76 - 1.27 mg/dL Final         Passed - HBA1C is between 0 and 7.9 and within 180 days    HB A1C (BAYER DCA - WAIVED)  Date Value Ref Range Status  11/27/2020 6.4 (H) 4.8 - 5.6 % Final    Comment:             Prediabetes: 5.7 - 6.4          Diabetes: >6.4          Glycemic control for adults with diabetes: <7.0               **  Please note reference interval change**          Passed - Valid encounter within last 6 months    Recent Outpatient Visits          2 weeks ago Screen for colon cancer   [place], MD   1 month ago Essential hypertension   [place] Vigg, Avanti, MD   1 month ago Difficulty in walking, not elsewhere classified   [facility] Vigg, Avanti, MD   2 months ago Cerebrovascular accident (CVA), unspecified mechanism ([place])   [facility] Vigg, Avanti, MD      Future Appointments            In 1 month Vigg, Avanti, MD [place]   In 2 months Vigg, Avanti, MD [facility], [facility]

## 2021-02-12 NOTE — Progress Notes (Signed)
[facility] [address] [place]. [place] [address] [phone]       STROKE FOLLOW UP NOTE  Mr. Quantae Martel Date of Birth:  [birth year] Medical Record Number:  [id number]   Reason for Referral: stroke follow up    SUBJECTIVE:   CHIEF COMPLAINT:  Chief Complaint  Patient presents with   Follow-up    Rm 3 alone Pt is well, still having trouble with L side.  Overall stable, no new concerns     HPI:   Update 02/13/2020 JM: Mr Demetrius Revel is here today alone for a stroke follow-up. His brother brought him to prior visit but unfortunately he passed away 2 nights ago.  Reports improving left-sided sensory impairment with occasional numbness, gait impairment and right sided ataxia with weak right hand. He has completed therapies. Remains on disability - initially started by PMR but he has been released from their office and per patient, PCP refused completing forms as he was not an active patient prior to stroke.  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 Denies new stroke/TIA symptoms. Lives alone performing ADLs and IADLs independently. Remains on Eliquis and Atorvastatin without side effects. BP today 137/84. Labs A1c 6.4, LDL 85 11/27/2020.  No further concerns at this time.   History provided for reference purposes only Initial visit 11/07/2020 JM: Mr. Cessna is being seen for hospital follow-up unaccompanied (brother waiting in lobby).  Reports left-sided sensory impairment with occasional numbness, right sided ataxia and imbalance. Gradually improving currently working with PT/OT.  Currently using at [facility] but does not always use -denies any recent falls.  He lives alone but has supportive  family checking on him frequently.  Able to maintain ADLs indendently and has been gradually returning back to some IADLs.  He has not yet returned back to work at [employer] working in the [place] currently on short-term disability.  Denies new stroke/TIA symptoms.  Compliant on Eliquis and atorvastatin without side effects.  Blood pressure today 139/89.  Does not have a BP cuff at home to monitor.  Compliant on amlodipine and hydrochlorothiazide.  He has an appointment to establish care with PCP on 10/19.  No further concerns at this time.  Stroke admission 09/11/2020 Ruby Dilone is a 61 y.o. male with no significant medical history other than marijuana use who presented to [facility] on 09/11/2020 for evaluation of gait disturbance and clumsiness on the right upper and lower extremities that started the night prior.  CT head showed acute/subacute right superior cerebellar infarction as well as remote left superior cerebellar infarct.  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  He was transferred to [facility] on 8/4 for further evaluation and care and possible intervention by cardiothoracic surgery.  Personally reviewed hospitalization pertinent progress notes, lab work and imaging.  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  EF 70 to 75% without cardiac source of embolus identified.  LDL 169.  A1c 6.5.  Remains on IV heparin during hospitalization and recommend transitioning to Eliquis at discharge for 3 to 6 months and then repeat CT angio and if clot resolved switch to antiplatelet therapy.  Continue to be monitored by cardiology.  UDS positive THC.  Initiated atorvastatin 40 mg daily. Eval by Dr. Stanford Breed for input on anticoagulation and recommended nonemergent cardiac surgery evaluation in the future.  Dr. Roxan Hockey consulted recommend repeat CTA chest showing previously identified adherent thrombus no longer  visible.  Recommend repeat CT chest to 3 months to monitor for resolution of mediastinal adenopathy likely infectious in nature.  PT/OT/SLP recommended CIR for residual right hemiparesis with ataxia, left sensory deficits and deficits in higher level cognitive tasks.     PERTINENT IMAGING  CT HEAD 06/11/2020 IMPRESSION: 1. Acute/subacute nonhemorrhagic right superior cerebellar infarct. 2. Remote left superior cerebellar infarct. 3. Atherosclerosis.  CTA HEAD NECK 09/28/2020 IMPRESSION: Suboptimal contrast bolus timing.   Focal free floating or adherent thrombus along the ascending thoracic aorta.   Major cerebellar artery origins are not well evaluated due to contrast timing. Probable patent right superior cerebellar artery origin with suspected subsequent occlusion.   Perfusion imaging is likely providing incomplete information in the posterior fossa. There is 3 mL of penumbra identified partially corresponding to the area of right superior cerebellar infarction on the noncontrast head CT.   Foci of nodular consolidation in the visualized lungs suspicious for pneumonia. Reactive mediastinal adenopathy.  MR BRAIN WO CONTRAST 09/13/2020 IMPRESSION: 1. 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 Mild mass effect on the fourth ventricle which remains patent. No hydrocephalus. No evidence of hemorrhagic transformation. 2. Remote left superior cerebellar artery territory infarct. 3. Mild-to-moderate chronic microvascular ischemic changes of the white matter.  CTA CHEST 09/17/2019 IMPRESSION: 1. The previously identified wall adherent thrombus in the anterior wall of the ascending thoracic aorta is no longer visualized. No other filling defects identified in the thoracic aorta or within the heart. 2. Nodular consolidation cenetered in the bilateral upper lobes with reactive mediastinal lymphadenopathy is most likely infectious pneumonia. Consider  follow-up CT chest in 2-3 months to ensure resolution.      ROS:   14 system review of systems performed and negative with exception of those listed in HPI  PMH:  Past Medical History:  Diagnosis Date   Diabetes mellitus without complication ([place])    Hyperlipidemia    Hypertension    Hyponatremia    Medical history non-contributory    Stroke ([place])     PSH:  Past Surgical History:  Procedure Laterality Date   MANDIBLE FRACTURE SURGERY     NO PAST SURGERIES      Social History:  Social History   Socioeconomic History   Marital status: Single    Spouse name: Not on file   Number of children: Not on file   Years of education: Not on file   Highest education level: Not on file  Occupational History   Not on file  Tobacco Use   Smoking status: Never   Smokeless tobacco: Never  Vaping Use   Vaping Use: Never used  Substance and Sexual Activity   Alcohol use: Yes    Alcohol/week: 6.0 standard drinks    Types: 6 Cans of beer per week   Drug use: Yes    Types: Marijuana   Sexual activity: Not Currently  Other Topics Concern   Not on file  Social History Narrative   Not on file   Social Determinants of Health   Financial Resource Strain: Not on file  Food Insecurity: Not on file  Transportation Needs: Not on file  Physical Activity: Not on file  Stress: Not on file  Social  Connections: Not on file  Intimate Partner Violence: Not on file    Family History:  Family History  Problem Relation Age of Onset   Kidney disease Mother    Hip fracture Father     Medications:   Current Outpatient Medications on File Prior to Visit  Medication Sig Dispense Refill   amLODipine (NORVASC) 10 MG tablet Take 1 tablet (10 mg total) by mouth daily. 30 tablet 1   atorvastatin (LIPITOR) 80 MG tablet Take 1 tablet (80 mg total) by mouth daily. 90 tablet 1   ELIQUIS 5 MG TABS tablet Take 1 tablet by mouth twice daily 60 tablet 0   hydrochlorothiazide (MICROZIDE) 12.5 MG  capsule Take 1 capsule (12.5 mg total) by mouth daily. 30 capsule 3   metFORMIN (GLUCOPHAGE) 500 MG tablet Take 1 tablet (500 mg total) by mouth daily with breakfast. 30 tablet 3   No current facility-administered medications on file prior to visit.    Allergies:  No Known Allergies    OBJECTIVE:  Physical Exam  Vitals:   02/12/21 0728  BP: 137/84  Pulse: 75  Weight: 153 lb (69.4 kg)  Height: 5\' 5"  (1.651 m)   Body mass index is 25.46 kg/m. No results found.  General: well developed, well nourished, very pleasant middle-aged African-American male, seated, in no evident distress Head: head normocephalic and atraumatic.   Neck: supple with no carotid or supraclavicular bruits Cardiovascular: regular rate and rhythm, no murmurs Musculoskeletal: no deformity Skin:  no rash/petichiae Vascular:  Normal pulses all extremities   Neurologic Exam Mental Status: Awake and fully alert. Mild dysarthria (per patient, baseline speech).  No evidence of aphasia.  Oriented to place and time. Recent and remote memory intact. Attention span, concentration and fund of knowledge appropriate during visit. Mood and affect appropriate.  Cranial Nerves: Pupils equal, briskly reactive to light. Extraocular movements full without nystagmus. Visual fields full to confrontation. Hearing intact. Facial sensation intact. Face, tongue, palate moves normally and symmetrically.  Motor: Normal bulk and tone. Normal strength in all tested extremity muscles except mildly weak right hand grip and decreased finger dexterity Sensory: intact to touch , pinprick , position and vibratory sensation although subjective decreased sensation on LUE and LLE.  Coordination: Rapid alternating movements normal in all extremities except decreased right hand. Finger-to-nose and heel-to-shin right-sided ataxia, normal on left. Gait and Station: Arises from chair without difficulty. Stance is normal. Gait demonstrates ataxic gait  with mild unsteadiness without use of assistive device.  Difficulty performing tandem walk and heel toe.  Romberg negative Reflexes: 1+ and symmetric. Toes downgoing.        ASSESSMENT: Forbes Loll is a 61 y.o. 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 Vascular risk factors include HLD, pre-DM, THC use and aortic thrombus.      PLAN:  Right cerebellar stroke:  Residual deficit: Left sensory impairment, right-sided ataxia and gait impairment.  Therapies completed. Will further look into disability as under impression PMR was previously completing - if so, will need to follow back up with their office to continue.  Continue Eliquis (apixaban) daily  and atorvastatin 40 mg daily for secondary stroke prevention.   Discussed secondary stroke prevention measures and importance of close PCP follow up for aggressive stroke risk factor management. I have gone over the pathophysiology of stroke, warning signs and symptoms, risk factors and their management in  some detail with instructions to go to the closest emergency room for symptoms of concern. Aortic thrombus: has not yet had repeat CTA chest as recommended at hospital discharge by PCP. Will refer to vascular surgery to determine ongoing need of Eliquis. If eliquis can be discontinued, would recommend transitioning to aspirin 81mg  daily indefinitely  HTN: BP goal <130/90. Stable toady.  Continue current regimen HLD: LDL goal <70. Recent LDL 85 down from 169.  Continue atorvastatin 40 mg daily Pre-DM: A1c goal<7.  A1c 6.4 on metformin per PCP.     Follow up in 4 months or call earlier if needed    CC:  PCP: Vigg, Avanti, MD     I spent 37 minutes of face-to-face and non-face-to-face time with patient.  This included previsit chart review, lab review, study review,  electronic health record documentation, patient education regarding prior stroke including etiology and residual stroke deficits, secondary stroke prevention measures and importance of managing stroke risk factors, residual deficits and typical recovery time and disability concerns and answered all other questions to patient satisfaction  Frann Rider, AGNP-BC  [facility] [address] [place], [place] [address]  Phone [phone] Fax [phone] Note: This document was prepared with digital dictation and possible smart phrase technology. Any transcriptional errors that result from this process are unintentional.

## 2021-02-12 NOTE — Telephone Encounter (Signed)
Can we please look in to who previously completed paperwork for patients short term disability?  Under the impression physical medicine rehab completed but want to ensure. Thank you!

## 2021-02-13 ENCOUNTER — Ambulatory Visit: Payer: BC Managed Care – PPO | Admitting: Physical Therapy

## 2021-02-13 NOTE — Telephone Encounter (Signed)
Yes this is what the patient told me as well but I couldn't verify that in epic - I wanted to verify that PMR was assisting with disability as he was seen by PMR for 1 visit after his stroke and then advised to f/u as needed which is odd if they were completing. Patient reported he tried to contact PMR but unable to get in contact with them.

## 2021-02-13 NOTE — Telephone Encounter (Signed)
Contacted pt to see who completed his previous paperwork. He and sister stated Dr Ranell Patrick did complete it previously. I asked if they declined to continue filling out the forms and she said no she just figured it would come here due to stroke. Did advise her to reach out to Dr Ranell Patrick for continuation as they originally signed it. Do you agree or would say otherwise?

## 2021-02-18 ENCOUNTER — Ambulatory Visit: Payer: BC Managed Care – PPO | Admitting: Physical Therapy

## 2021-02-19 ENCOUNTER — Telehealth: Payer: Self-pay | Admitting: Adult Health

## 2021-02-19 ENCOUNTER — Other Ambulatory Visit: Payer: Self-pay | Admitting: Physical Medicine and Rehabilitation

## 2021-02-19 DIAGNOSIS — I7410 Embolism and thrombosis of unspecified parts of aorta: Secondary | ICD-10-CM

## 2021-02-19 NOTE — Telephone Encounter (Signed)
Levada Dy from [facility] called stating that they can not see this pt that was referred to them. Thrombus noted in ascending aorta - needs to to go TCTS

## 2021-02-20 ENCOUNTER — Ambulatory Visit: Payer: BC Managed Care – PPO | Admitting: Physical Therapy

## 2021-02-21 ENCOUNTER — Other Ambulatory Visit: Payer: Self-pay | Admitting: *Deleted

## 2021-02-21 DIAGNOSIS — I7410 Embolism and thrombosis of unspecified parts of aorta: Secondary | ICD-10-CM

## 2021-02-21 NOTE — Progress Notes (Unsigned)
T

## 2021-02-25 ENCOUNTER — Ambulatory Visit: Payer: BC Managed Care – PPO | Admitting: Physical Therapy

## 2021-02-25 ENCOUNTER — Telehealth: Payer: Self-pay | Admitting: *Deleted

## 2021-02-25 NOTE — Telephone Encounter (Signed)
Received fax from [facility] re: clearance for colonoscopy on 03/06/21. Placed on NP's desk for completion.

## 2021-02-25 NOTE — Telephone Encounter (Signed)
Colonoscopy clearance letter completed, signed by NP and faxed to [facility]. Received confirmation.

## 2021-02-25 NOTE — Telephone Encounter (Signed)
Patient will first need to be cleared by cardiothoracic prior to stopping as patient taking due to hx of AV thrombus. He is scheduled to imaging 1/19 by cardiothoracic.

## 2021-02-26 ENCOUNTER — Telehealth: Payer: Self-pay

## 2021-02-26 NOTE — Telephone Encounter (Signed)
Called to inform patient's sister Vanita Ingles) that patient was not cleared to have colonoscopy. He will need to see cardiology before being cleared. Patient has been informed of imaging on 01/19 for Cardiology. He will contact our office once he has been cleared for procedure. Letter will be mailed.

## 2021-02-27 ENCOUNTER — Other Ambulatory Visit: Payer: Self-pay

## 2021-02-27 ENCOUNTER — Ambulatory Visit
Admission: RE | Admit: 2021-02-27 | Discharge: 2021-02-27 | Disposition: A | Discharge status: Home / Self Care | Payer: BC Managed Care – PPO | Source: Ambulatory Visit | Attending: Thoracic Surgery (Cardiothoracic Vascular Surgery) | Admitting: Thoracic Surgery (Cardiothoracic Vascular Surgery)

## 2021-02-27 ENCOUNTER — Ambulatory Visit: Payer: BC Managed Care – PPO | Admitting: Physical Therapy

## 2021-02-27 DIAGNOSIS — I2510 Atherosclerotic heart disease of native coronary artery without angina pectoris: Secondary | ICD-10-CM | POA: Diagnosis not present

## 2021-02-27 DIAGNOSIS — I712 Thoracic aortic aneurysm, without rupture, unspecified: Secondary | ICD-10-CM | POA: Diagnosis not present

## 2021-02-27 DIAGNOSIS — I7410 Embolism and thrombosis of unspecified parts of aorta: Secondary | ICD-10-CM

## 2021-02-27 DIAGNOSIS — I700 Atherosclerosis of aorta: Secondary | ICD-10-CM | POA: Diagnosis not present

## 2021-02-27 IMAGING — CT CT ANGIO CHEST
1 series · 18 of 32 positions shown · IV contrast (agent unspecified)
Comparison: [DATE]

CLINICAL DATA: LABELLE SALHA, [HOSPITAL] aortic thrombus

EXAM:
CT ANGIOGRAPHY CHEST WITH CONTRAST
TECHNIQUE: Multidetector CT imaging of the chest was performed using the
standard protocol during bolus administration of intravenous
contrast. Multiplanar CT image reconstructions and MIPs were
obtained to evaluate the vascular anatomy.

[Series 4: thoracic cta 2mm · axial · 0.79mm/px · z∈[-330,-40]mm · 18 of 156 slices shown]
[im 6/156  lung]
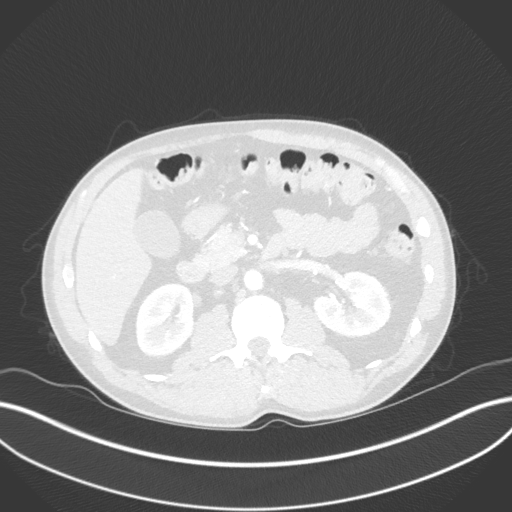
[im 16/156  soft-tissue]
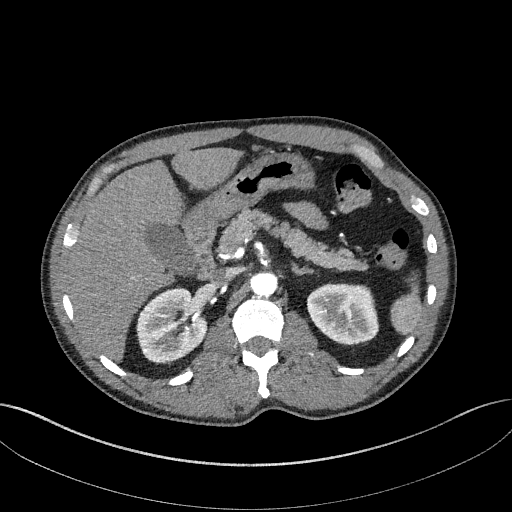
[im 26/156  lung]
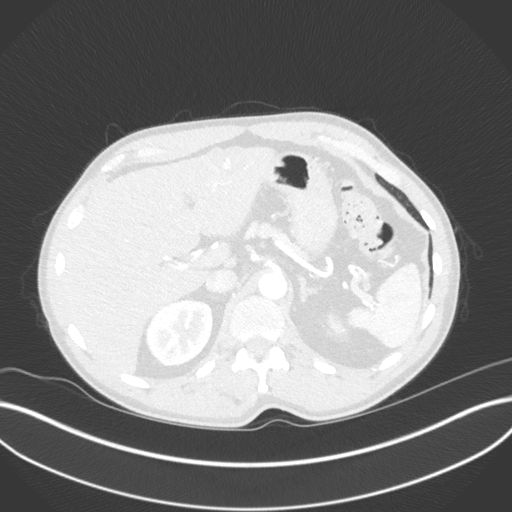
[im 31/156  soft-tissue]
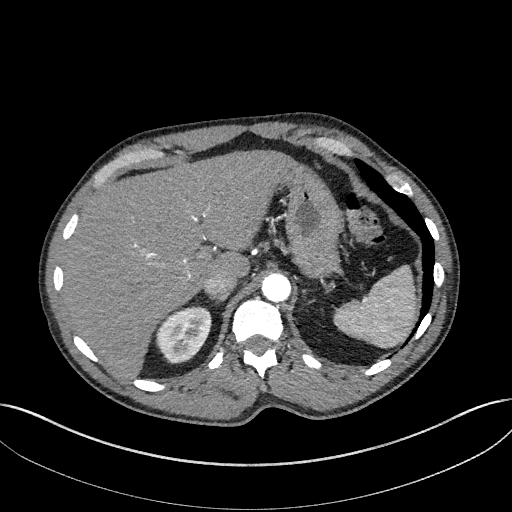
[im 41/156  lung]
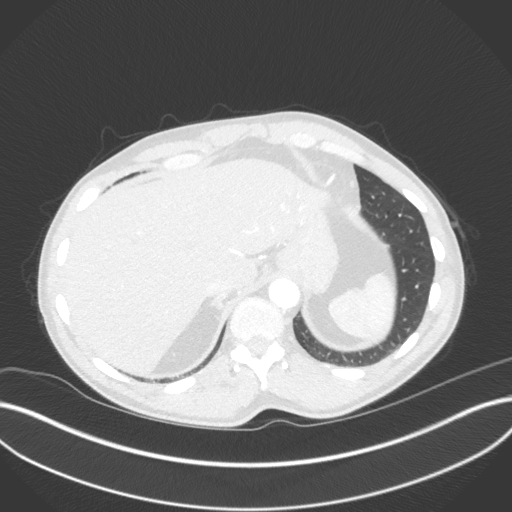
[im 51/156  soft-tissue]
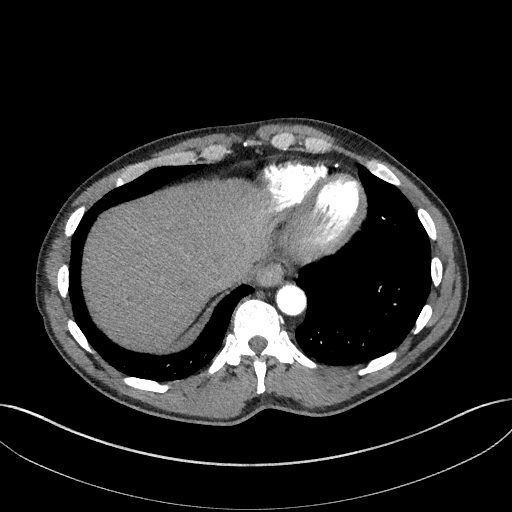
[im 56/156  lung]
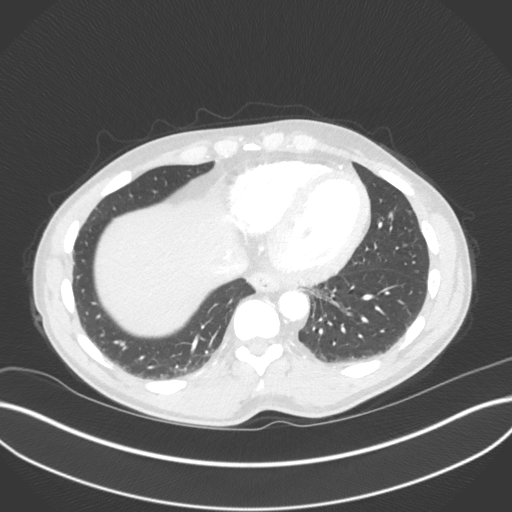
[im 66/156  soft-tissue]
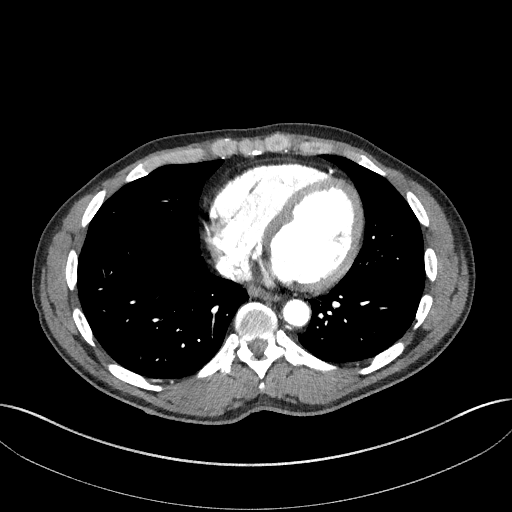
[im 76/156  lung]
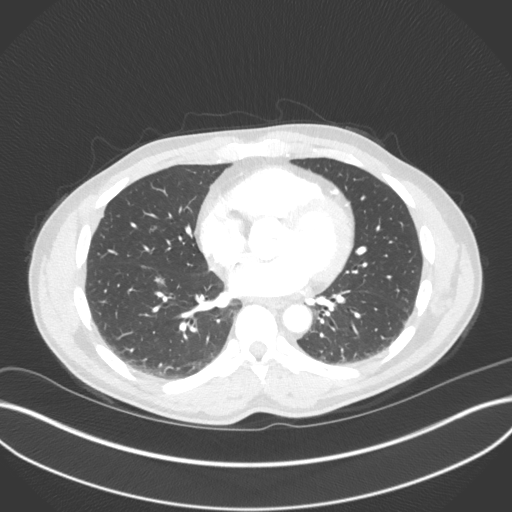
[im 81/156  soft-tissue]
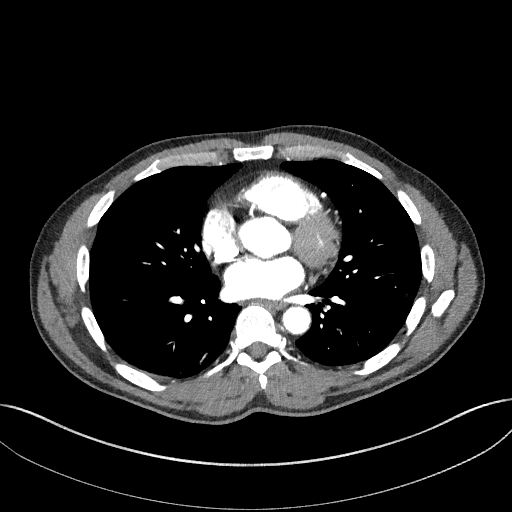
[im 91/156  lung]
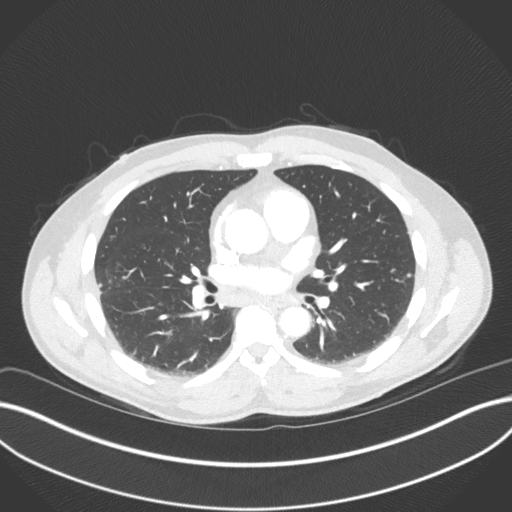
[im 101/156  soft-tissue]
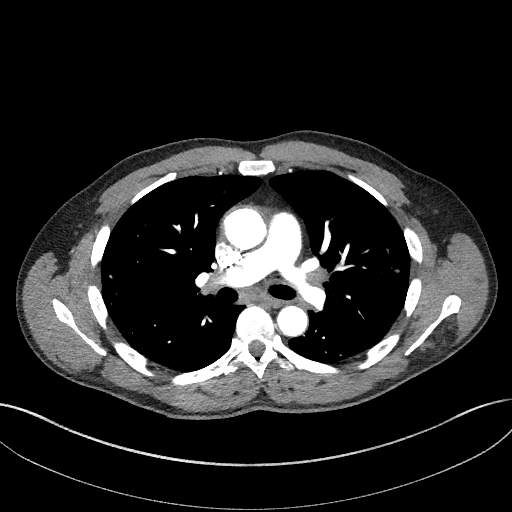
[im 106/156  lung]
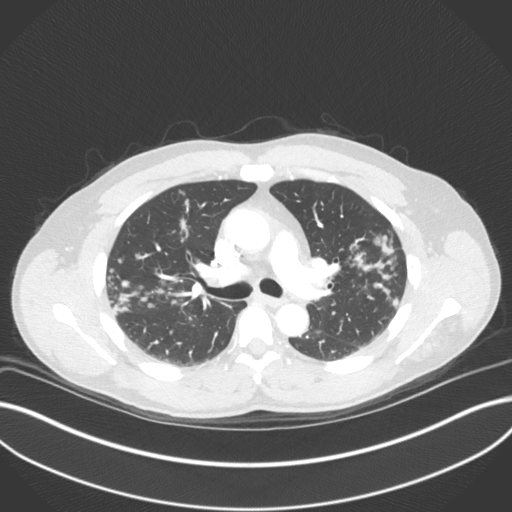
[im 116/156  soft-tissue]
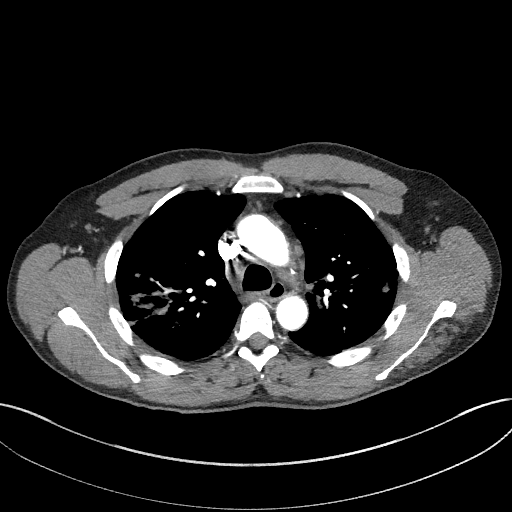
[im 126/156  lung]
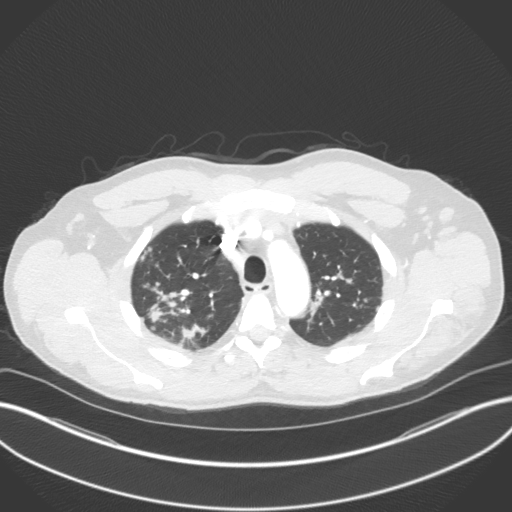
[im 131/156  soft-tissue]
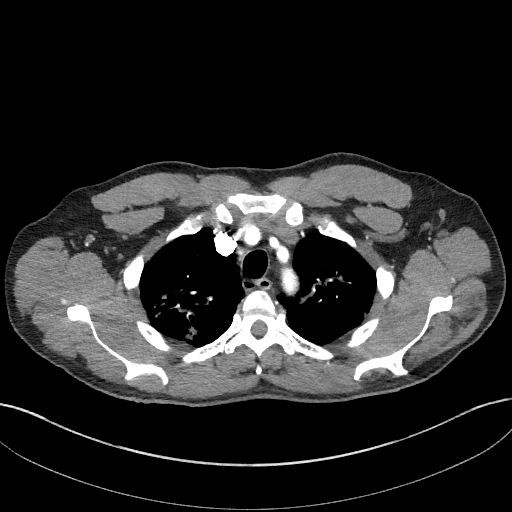
[im 141/156  lung]
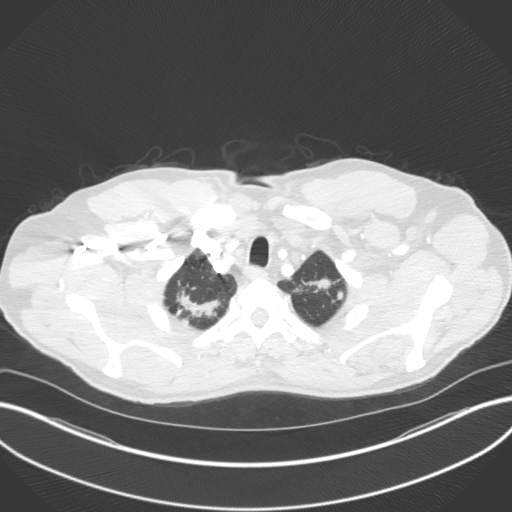
[im 151/156  soft-tissue]
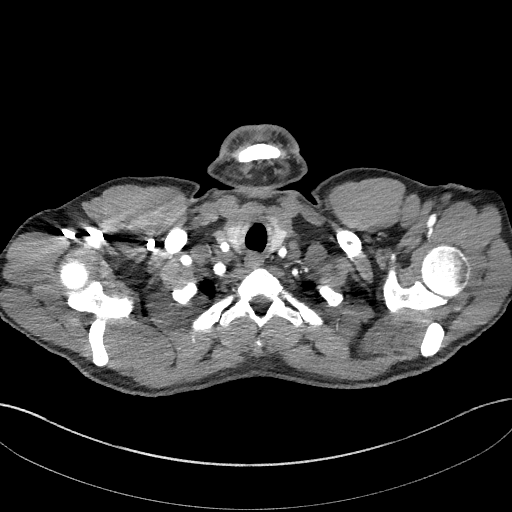

[18 of 32 positions shown; findings below may reference images not displayed]

RADIATION DOSE REDUCTION: This exam was performed according to the
departmental dose-optimization program which includes automated
exposure control, adjustment of the mA and/or kV according to
patient size and/or use of iterative reconstruction technique.

CONTRAST:  75mL [HB] IOPAMIDOL ([HB]) INJECTION 76%
FINDINGS: Cardiovascular: Preferential opacification of the thoracic aorta.
Normal contour and caliber of the thoracic aortic, the tubular
ascending thoracic aorta measuring up to 3.4 x 3.4 cm. Aortic valve
measures approximately 2.7 cm in caliber. Sinuses of Valsalva
measure up to 3.4 cm. The descending thoracic aorta measures up to
2.1 x 2.0 cm. Normal heart size. Left coronary artery
calcifications. No pericardial effusion. Aortic atherosclerosis.

Mediastinum/Nodes: Numerous prominent mediastinal and bilateral
hilar lymph nodes. Thyroid gland, trachea, and esophagus demonstrate
no significant findings.

Lungs/Pleura: No significant change in extensive, upper lobe
predominant clustered, and confluent irregular nodularity and
associated mild architectural distortion, generally in a
perilymphatic distribution with some nodules concentrated along the
fissures. No pleural effusion or pneumothorax.

Upper Abdomen: No acute abnormality. Somewhat coarse, nodular
contour of the liver in the included upper abdomen. There is a
subcapsular, internally hyperenhancing lesion of the anterior left
lobe of the liver, hepatic segment III, which is ill-defined but
approximately 3.1 x 2.4 cm (series 4, image 132).

Musculoskeletal: No chest wall abnormality. No acute osseous
findings.

Review of the MIP images confirms the above findings.
IMPRESSION: 1. Normal contour and caliber of the thoracic aorta. Mild
atherosclerosis. No evidence of intraluminal thrombus.
2. No significant change in extensive, upper lobe predominant
clustered, and confluent irregular nodularity and associated mild
architectural distortion, generally in a perilymphatic distribution
with some nodules concentrated along the fissures. Findings are most
consistent with pulmonary sarcoidosis.
3. Numerous prominent mediastinal and bilateral hilar lymph nodes,
likely as above representing nodal sarcoidosis.
4. Coronary artery disease.
5. Somewhat coarse, nodular contour of the liver in the included
upper abdomen, suggestive of cirrhosis.
6. There is a subcapsular, internally hyperenhancing lesion of the
anterior left lobe of the liver, hepatic segment III, which is
ill-defined but approximately 3.1 x 2.4 cm. Although this may
reflect a benign hemangioma, this lesion is incompletely
characterized on this single phase early arterial examination.
Recommend multiphasic contrast enhanced MRI of the liver to fully
characterize.

Aortic Atherosclerosis ([HB]-[HB]).

## 2021-02-27 MED ORDER — IOPAMIDOL (ISOVUE-370) INJECTION 76%
75.0000 mL | Freq: Once | INTRAVENOUS | Status: AC | PRN
  Administered 2021-02-27: 75 mL via INTRAVENOUS

## 2021-03-03 ENCOUNTER — Ambulatory Visit (INDEPENDENT_AMBULATORY_CARE_PROVIDER_SITE_OTHER): Payer: BC Managed Care – PPO | Admitting: Physician Assistant

## 2021-03-03 ENCOUNTER — Other Ambulatory Visit: Payer: Self-pay

## 2021-03-03 VITALS — BP 154/93 | HR 86 | Resp 20 | Ht 65.5 in | Wt 152.0 lb

## 2021-03-03 DIAGNOSIS — I7410 Embolism and thrombosis of unspecified parts of aorta: Secondary | ICD-10-CM

## 2021-03-03 NOTE — Progress Notes (Signed)
[address]       [place] [address]             [phone]      Malik Jones is a 61 yo male with history of stroke suffered in August of 2022.  At that time his Echocardiogram showed no evidence of valvular disease.  There was some question of a right atrial thrombus being present.  Dr. Roxan Hockey was consulted who did not feel surgery was indicated.  He recommended an evaluation by Hematology to ensure the patient did not have an underlying hypercoagulable state.  He also recommended repeat CTA chest in 3 months to ensure no further thrombus was present.  The patient presents to our office today and is unsure why he is here.  In review of his chart, the Neurology NP stated he would need clearance from our office to stop his Eliquis prior to a Colonoscopy.  The patient is a poor historian.  He continues to take Eliquis.  He has not seen a hematologist that he knows of for workup for possible hyper-coagulable state.  Current Outpatient Medications  Medication Sig Dispense Refill   amLODipine (NORVASC) 10 MG tablet Take 1 tablet by mouth once daily 30 tablet 0   atorvastatin (LIPITOR) 40 MG tablet Take 1 tablet by mouth once daily 30 tablet 0   atorvastatin (LIPITOR) 80 MG tablet Take 1 tablet by mouth once daily 90 tablet 1   ELIQUIS 5 MG TABS tablet Take 1 tablet by mouth twice daily 60 tablet 0   hydrochlorothiazide (MICROZIDE) 12.5 MG capsule Take 1 capsule (12.5 mg total) by mouth daily. 30 capsule 3   metFORMIN (GLUCOPHAGE) 500 MG tablet Take 1 tablet by mouth once daily with breakfast 90 tablet 1   No current facility-administered medications for this visit.    Physical Exam:  BP (!) 154/93 (BP Location: Left Arm, Patient Position: Sitting, Cuff Size: Large)    Pulse 86    Resp 20    Ht 5' 5.5" (1.664 m)    Wt 152 lb (68.9 kg)    SpO2 95% Comment: RA   BMI 24.91 kg/m   Gen: no apparent distress Heart: RRR Lungs: CTA bilaterally Ext: no edema Neuro:  mildy confusion  Diagnostic Tests:  CLINICAL DATA:  TAA, sus aortic thrombus   EXAM: CT ANGIOGRAPHY CHEST WITH CONTRAST   TECHNIQUE: Multidetector CT imaging of the chest was performed using the standard protocol during bolus administration of intravenous contrast. Multiplanar CT image reconstructions and MIPs were obtained to evaluate the vascular anatomy.   RADIATION DOSE REDUCTION: This exam was performed according to the departmental dose-optimization program which includes automated exposure control, adjustment of the mA and/or kV according to patient size and/or use of iterative reconstruction technique.   CONTRAST:  4mL ISOVUE-370 IOPAMIDOL (ISOVUE-370) INJECTION 76%   COMPARISON:  09/16/2020   FINDINGS: Cardiovascular: Preferential opacification of the thoracic aorta. Normal contour and caliber of the thoracic aortic, the tubular ascending thoracic aorta measuring up to 3.4 x 3.4 cm. Aortic valve measures approximately 2.7 cm in caliber. Sinuses of Valsalva measure up to 3.4 cm. The descending thoracic aorta measures up to 2.1 x 2.0 cm. Normal heart size. Left coronary artery calcifications. No pericardial effusion. Aortic atherosclerosis.   Mediastinum/Nodes: Numerous prominent mediastinal and bilateral hilar lymph nodes. Thyroid gland, trachea, and esophagus demonstrate no significant findings.   Lungs/Pleura: No significant change in extensive, upper lobe predominant clustered, and confluent irregular nodularity  and associated mild architectural distortion, generally in a perilymphatic distribution with some nodules concentrated along the fissures. No pleural effusion or pneumothorax.   Upper Abdomen: No acute abnormality. Somewhat coarse, nodular contour of the liver in the included upper abdomen. There is a subcapsular, internally hyperenhancing lesion of the anterior left lobe of the liver, hepatic segment III, which is ill-defined but approximately 3.1 x  2.4 cm (series 4, image 132).   Musculoskeletal: No chest wall abnormality. No acute osseous findings.   Review of the MIP images confirms the above findings.   IMPRESSION: 1. Normal contour and caliber of the thoracic aorta. Mild atherosclerosis. No evidence of intraluminal thrombus. 2. No significant change in extensive, upper lobe predominant clustered, and confluent irregular nodularity and associated mild architectural distortion, generally in a perilymphatic distribution with some nodules concentrated along the fissures. 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 3. Numerous prominent mediastinal and bilateral hilar lymph nodes, likely as above representing nodal sarcoidosis. 4. Coronary artery disease. 5. Somewhat coarse, nodular contour of the liver in the included upper abdomen, suggestive of cirrhosis. 6. There is a subcapsular, internally hyperenhancing lesion of the anterior left lobe of the liver, hepatic segment III, which is ill-defined but approximately 3.1 x 2.4 cm. Although this may reflect a benign hemangioma, this lesion is incompletely characterized on this single phase early arterial examination. Recommend multiphasic contrast enhanced MRI of the liver to fully characterize.   Aortic Atherosclerosis (ICD10-I70.0).     Electronically Signed   By: Delanna Ahmadi M.D.   On: 02/27/2021 12:31   A/P:  Patient had a stroke back in August of 2022.  At that time he was found to have a free floating thrombus in his right atrium, which is no longer present on subsequent studies, including CT scan from 1/19.  Patient was not worked up by Hematology as recommended for possible hyper-coagulable state.  He remains on Eliquis and he was referred to our office to see if he can stop taking this medication for his colonoscopy.  Unfortunately, we did not prescribe this medication and we will not instruct patient to stop taking at this time.  It would be best for  him to be evaluated by prescribing provider.     RTC prn   Ellwood Handler, PA-C [facility] [phone]

## 2021-03-04 ENCOUNTER — Ambulatory Visit: Payer: BC Managed Care – PPO | Admitting: Physical Therapy

## 2021-03-04 ENCOUNTER — Other Ambulatory Visit: Payer: Self-pay | Admitting: Adult Health

## 2021-03-04 DIAGNOSIS — I639 Cerebral infarction, unspecified: Secondary | ICD-10-CM

## 2021-03-04 MED ORDER — ASPIRIN EC 81 MG PO TBEC: 81.0000 mg | DELAYED_RELEASE_TABLET | Freq: Every day | ORAL | 11 refills | Status: DC

## 2021-03-04 NOTE — Progress Notes (Signed)
Please advise patient that Eliquis can be stopped and to start taking aspirin 81mg  daily for secondary stroke prevention measures. Thank you.

## 2021-03-04 NOTE — Progress Notes (Signed)
My chart message sent to pt.

## 2021-03-05 ENCOUNTER — Telehealth: Payer: Self-pay | Admitting: Internal Medicine

## 2021-03-05 ENCOUNTER — Telehealth: Payer: Self-pay

## 2021-03-05 NOTE — Telephone Encounter (Signed)
Pt received a letter from [facility] that they will schedule him after being cleared by his cardiologist but the pt stated he was already scheduled for tomorrow at 8:15/ pt stated he was also given the drink to take before his appt/ pt stated he has not went to cardiologist / pt needs to speak with Dr. Neomia Dear or the nurse asap today for clarity about the letter received and the medication / please advise

## 2021-03-05 NOTE — Telephone Encounter (Signed)
Patient called his Gastro Dr. And they cleared up his confusion.

## 2021-03-05 NOTE — Telephone Encounter (Signed)
Returned patients call he didn't understand his paperwork I called them back he now understands he dont have the clearance to have procedure he is going to get appointment with his doctor who prescribed the eliquis and call us back after to reschedule called endo and canceled procedure

## 2021-03-06 ENCOUNTER — Encounter: Admission: RE | Payer: Self-pay | Source: Home / Self Care

## 2021-03-06 ENCOUNTER — Ambulatory Visit
Admission: RE | Admit: 2021-03-06 | Payer: BC Managed Care – PPO | Source: Home / Self Care | Admitting: Gastroenterology

## 2021-03-06 ENCOUNTER — Ambulatory Visit: Payer: BC Managed Care – PPO | Admitting: Physical Therapy

## 2021-03-06 SURGERY — COLONOSCOPY WITH PROPOFOL
Anesthesia: General

## 2021-03-11 ENCOUNTER — Telehealth: Payer: Self-pay | Admitting: Adult Health

## 2021-03-11 ENCOUNTER — Ambulatory Visit: Payer: BC Managed Care – PPO | Admitting: Physical Therapy

## 2021-03-11 NOTE — Telephone Encounter (Signed)
Contacted Amber back, informed her when we spoke to pt on the 5th, he and sister informed us Dr Ranell Patrick is who previously managed pts disability and was advised to return to him for continuation. This office is not managing It. She understood and was appreciative.

## 2021-03-11 NOTE — Telephone Encounter (Signed)
Amber from [employer] called in asking for clarification regarding pt's disability and if he needed to be out of work. Please advise at [phone].

## 2021-03-13 ENCOUNTER — Ambulatory Visit: Payer: BC Managed Care – PPO | Admitting: Physical Therapy

## 2021-03-16 ENCOUNTER — Encounter: Payer: Self-pay | Admitting: Physical Medicine and Rehabilitation

## 2021-03-16 ENCOUNTER — Encounter: Payer: Self-pay | Admitting: Adult Health

## 2021-03-17 ENCOUNTER — Ambulatory Visit: Payer: BC Managed Care – PPO | Admitting: Physical Therapy

## 2021-03-17 NOTE — Telephone Encounter (Signed)
Are you willing to manage this for pt now ?

## 2021-03-17 NOTE — Telephone Encounter (Signed)
Yes that is fine. Looks like he also sent message yesterday to [facility] but no response as of yet. As he has been having difficulty contacting their office, we can take over disability.

## 2021-03-18 ENCOUNTER — Other Ambulatory Visit: Payer: Self-pay | Admitting: *Deleted

## 2021-03-18 DIAGNOSIS — K769 Liver disease, unspecified: Secondary | ICD-10-CM

## 2021-03-19 ENCOUNTER — Ambulatory Visit: Payer: BC Managed Care – PPO | Admitting: Physical Therapy

## 2021-03-21 ENCOUNTER — Other Ambulatory Visit: Payer: Self-pay | Admitting: Physical Medicine and Rehabilitation

## 2021-03-21 ENCOUNTER — Telehealth: Payer: Self-pay

## 2021-03-21 NOTE — Telephone Encounter (Signed)
Spoke with Malik Jones at [facility] and cancelled Amlodipine prescription. Patient is to get medication from PCP Dr. Charlynne Cousins

## 2021-03-21 NOTE — Telephone Encounter (Signed)
Spoke with Wells Guiles at [facility] to cancel Amlodipine prescription. Medication is to be sent to PCP Dr. Charlynne Cousins

## 2021-03-24 ENCOUNTER — Ambulatory Visit: Payer: BC Managed Care – PPO | Admitting: Physical Therapy

## 2021-03-25 ENCOUNTER — Ambulatory Visit: Payer: BC Managed Care – PPO | Admitting: Internal Medicine

## 2021-03-26 ENCOUNTER — Ambulatory Visit: Payer: BC Managed Care – PPO | Admitting: Physical Therapy

## 2021-03-27 ENCOUNTER — Ambulatory Visit
Admission: RE | Admit: 2021-03-27 | Discharge: 2021-03-27 | Disposition: A | Discharge status: Home / Self Care | Payer: BC Managed Care – PPO | Source: Ambulatory Visit | Attending: Thoracic Surgery (Cardiothoracic Vascular Surgery) | Admitting: Thoracic Surgery (Cardiothoracic Vascular Surgery)

## 2021-03-27 DIAGNOSIS — K769 Liver disease, unspecified: Secondary | ICD-10-CM

## 2021-03-27 DIAGNOSIS — R590 Localized enlarged lymph nodes: Secondary | ICD-10-CM | POA: Diagnosis not present

## 2021-03-27 DIAGNOSIS — D1803 Hemangioma of intra-abdominal structures: Secondary | ICD-10-CM | POA: Diagnosis not present

## 2021-03-27 IMAGING — MR MR ABDOMEN WO/W CM
17 series · 48 of 48 positions shown · IV contrast (15 mL Multihance)
Comparison: CT [DATE]

CLINICAL DATA: Further evaluation of enhancing hepatic lesion seen
on prior CT.

EXAM:
MRI ABDOMEN WITHOUT AND WITH CONTRAST
TECHNIQUE: Multiplanar multisequence MR imaging of the abdomen was performed
both before and after the administration of intravenous contrast.
CONTRAST:  15mL MULTIHANCE GADOBENATE DIMEGLUMINE 529 MG/ML IV SOLN

[Series 2: T2 · coronal · 5.0mm · 1.56mm/px · 3 of 36 slices shown (1 of 3)]
[im 1/36]
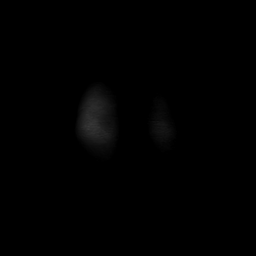
[im 18/36]
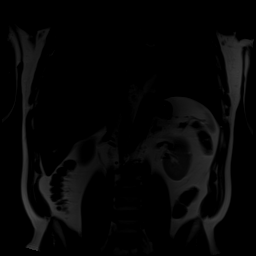
[im 36/36]
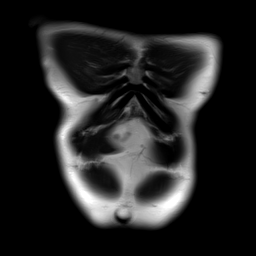

[Series 3: T1 · axial · 5.0mm · 0.74mm/px · z∈[-57,+141]mm · 3 of 68 slices shown]
[im 1/68]
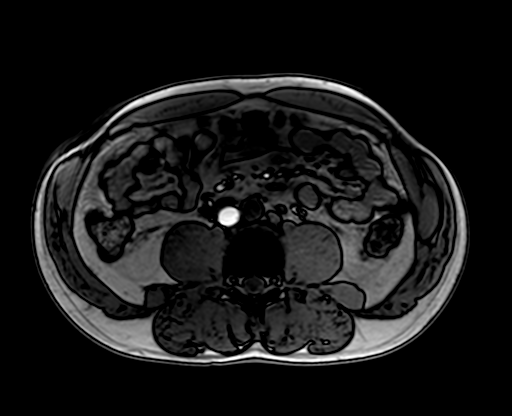
[im 34/68]
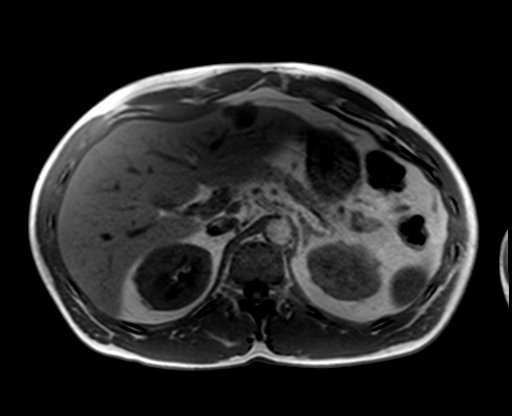
[im 68/68]
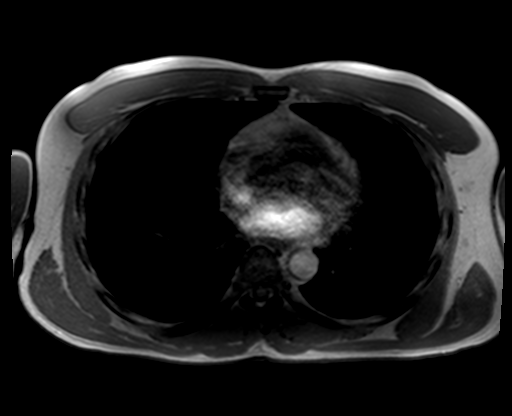

[Series 4: bSSFP · axial · 5.0mm · 1.25mm/px · z∈[-69,+153]mm · 2 of 38 slices shown]
[im 1/38]
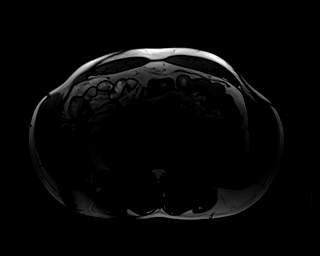
[im 38/38]
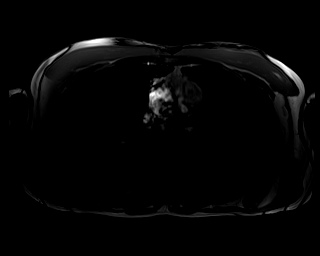

[Series 5: T2 · axial · 5.0mm · 1.48mm/px · z∈[-62,+160]mm · 2 of 38 slices shown (2 of 3)]
[im 1/38]
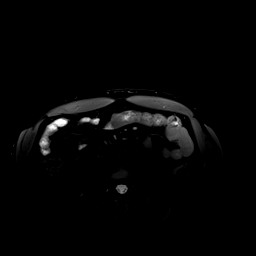
[im 38/38]
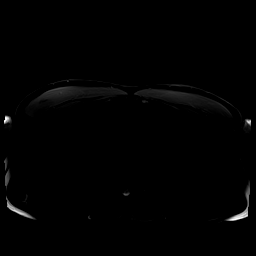

[Series 6: DWI · axial · 5.0mm · 1.42mm/px · z∈[-63,+147]mm · 5 of 108 slices shown (1 of 2)]
[im 1/108]
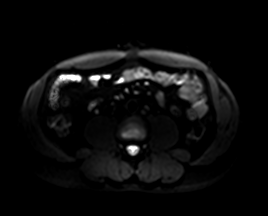
[im 27/108]
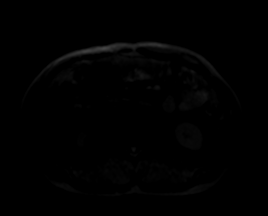
[im 54/108]
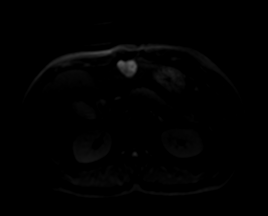
[im 81/108]
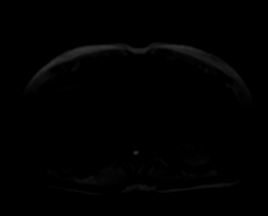
[im 108/108]
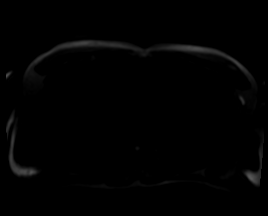

[Series 7: DWI · axial · 5.0mm · 1.42mm/px · z∈[-63,+147]mm · 2 of 36 slices shown (2 of 2)]
[im 1/36]
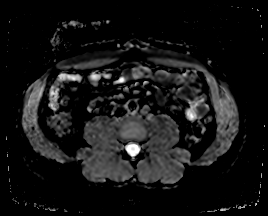
[im 36/36]
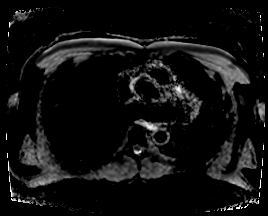

[Series 8: T2 · axial · 6.0mm · 1.19mm/px · 1 of 30 slices shown (3 of 3)]
[im 1/30]
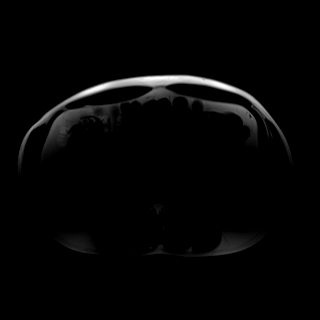

[Series 9: T1 dynamic · axial · non-contrast · 3.0mm · 1.25mm/px · z∈[-64,+149]mm · 3 of 72 slices shown]
[im 1/72]
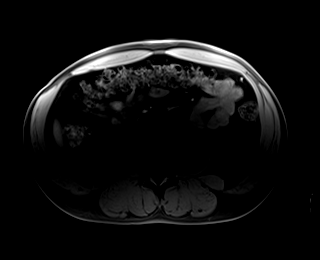
[im 36/72]
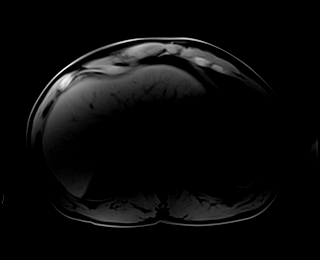
[im 72/72]
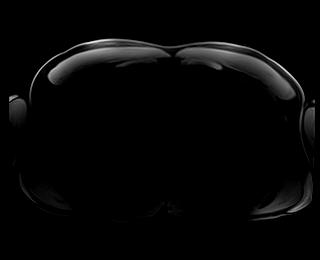

[Series 10: T1 dynamic post-contrast · axial · 3.0mm · 1.25mm/px · z∈[-64,+149]mm · 3 of 72 slices shown (1 of 9)]
[im 1/72]
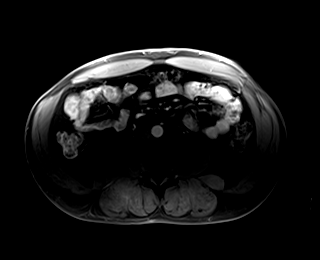
[im 36/72]
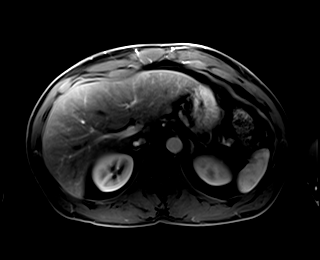
[im 72/72]
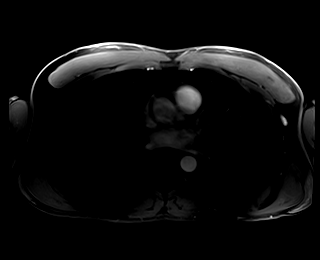

[Series 11: T1 dynamic post-contrast · axial · 3.0mm · 1.25mm/px · z∈[-64,+149]mm · 3 of 72 slices shown (2 of 9)]
[im 1/72]
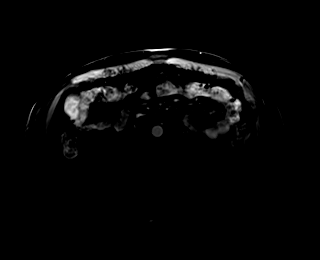
[im 36/72]
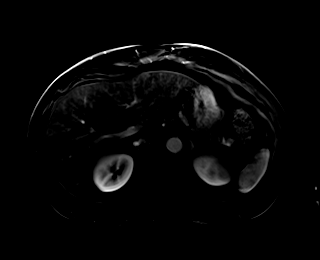
[im 72/72]
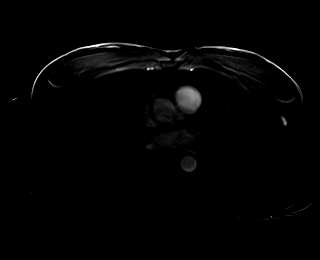

[Series 12: T1 dynamic post-contrast · axial · 3.0mm · 1.25mm/px · z∈[-64,+149]mm · 3 of 72 slices shown (3 of 9)]
[im 1/72]
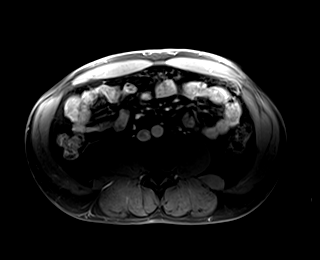
[im 36/72]
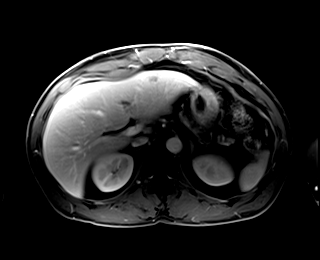
[im 72/72]
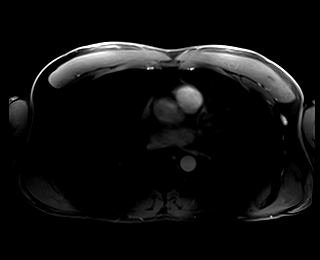

[Series 13: T1 dynamic post-contrast · axial · 3.0mm · 1.25mm/px · z∈[-64,+149]mm · 3 of 72 slices shown (4 of 9)]
[im 1/72]
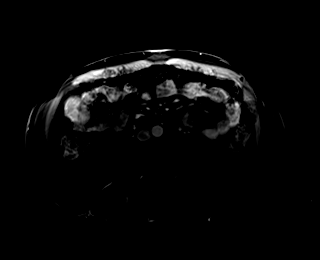
[im 36/72]
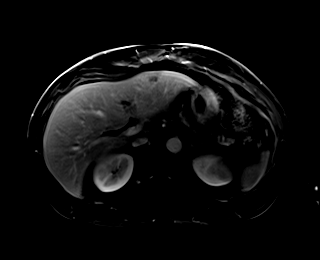
[im 72/72]
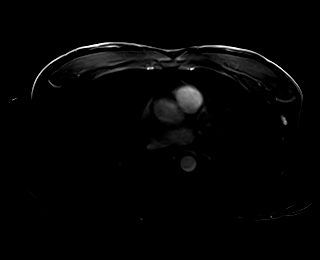

[Series 14: T1 dynamic post-contrast · axial · 3.0mm · 1.25mm/px · z∈[-64,+149]mm · 3 of 72 slices shown (5 of 9)]
[im 1/72]
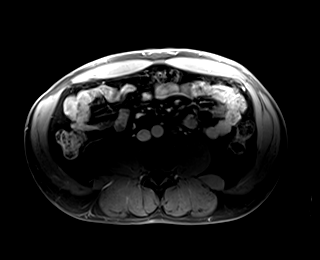
[im 36/72]
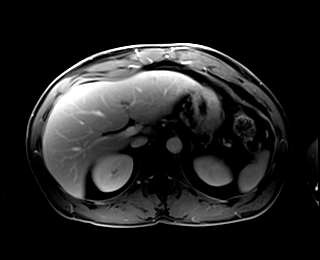
[im 72/72]
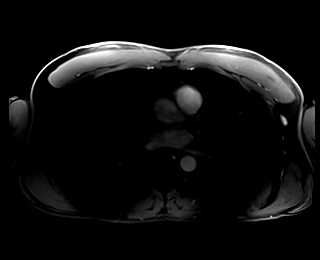

[Series 15: T1 dynamic post-contrast · axial · 3.0mm · 1.25mm/px · z∈[-64,+149]mm · 3 of 72 slices shown (6 of 9)]
[im 1/72]
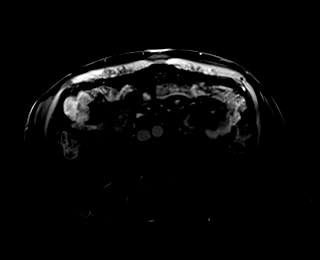
[im 36/72]
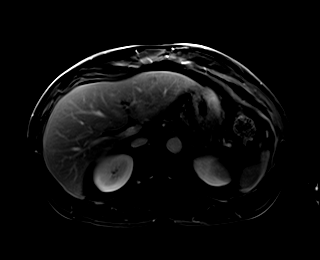
[im 72/72]
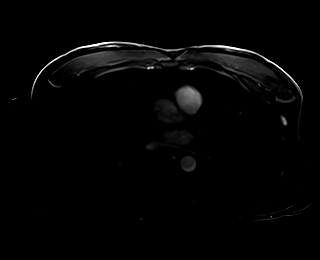

[Series 16: T1 dynamic post-contrast · coronal · 3.0mm · 1.25mm/px · 3 of 72 slices shown (7 of 9)]
[im 1/72]
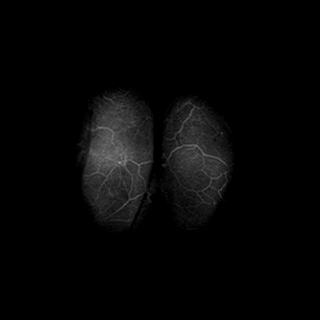
[im 36/72]
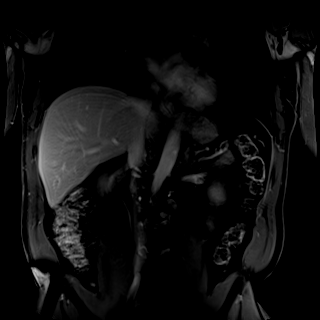
[im 72/72]
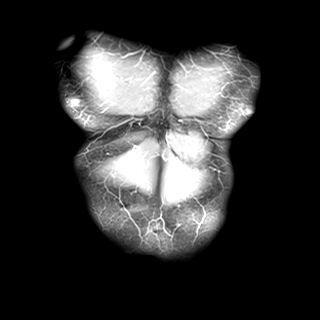

[Series 17: T1 dynamic post-contrast · axial · 3.0mm · 1.25mm/px · z∈[-64,+149]mm · 3 of 72 slices shown (8 of 9)]
[im 1/72]
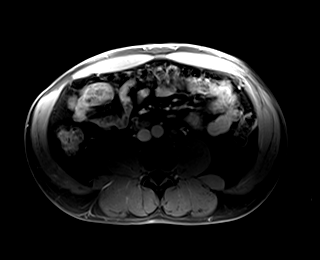
[im 36/72]
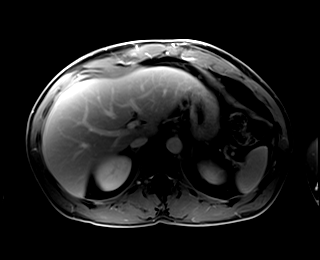
[im 72/72]
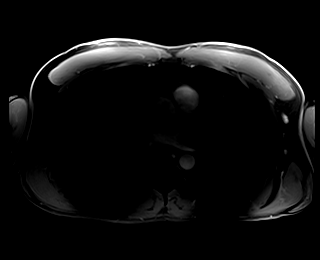

[Series 18: T1 dynamic post-contrast · axial · 3.0mm · 1.25mm/px · z∈[-64,+149]mm · 3 of 72 slices shown (9 of 9)]
[im 1/72]
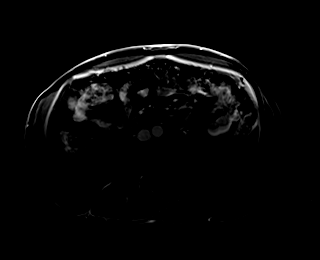
[im 36/72]
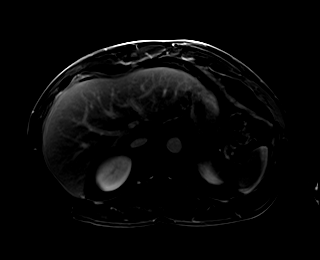
[im 72/72]
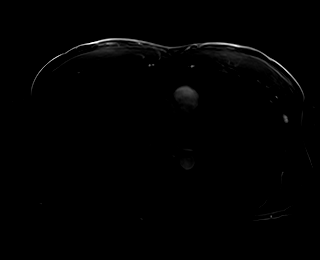

[48 of 48 positions shown; findings below may reference images not displayed]

FINDINGS: Lower chest: No acute abnormality.

Hepatobiliary: No significant hepatic steatosis. Well-circumscribed
2.1 cm segment segment II hepatic lesion on image [DATE] which
demonstrates arterial peripheral nodular discontinuous enhancement
with filling in of the lesion similar in intensity to background
aorta on delayed sequences consistent with a benign hepatic
hemangioma. Additional tiny hepatic hemangioma posterior to this in
the left lobe of the liver measuring 6 mm on image [DATE].
Well-circumscribed T2 hyperintense 2.8 x 2.0 cm segment IV hepatic
lesion on image [DATE] which demonstrates a nodular focus of arterial
enhancement with central fugal filling of the lesion on delayed
imaging similar in intensity to background aorta also most
consistent with a benign hepatic hemangioma.

Gallbladder is unremarkable.  No biliary ductal dilation.

Pancreas: Intrinsic T1 signal of the pancreatic parenchyma is within
normal limits. Homogeneous enhancement of the pancreas postcontrast
administration. No pancreatic ductal dilation. No cystic or solid
hyperenhancing pancreatic lesion identified.

Spleen:  Within normal limits in size and appearance.

Adrenals/Urinary Tract: Bilateral adrenal glands appear normal. No
hydronephrosis. No solid enhancing renal mass.

Stomach/Bowel: Visualized portions within the abdomen are
unremarkable.

Vascular/Lymphatic: Prominent upper abdominal and mesenteric lymph
nodes for instance a left periaortic lymph node measuring 8 mm on
image 36/5, a 6 mm portacaval lymph node on image [DATE] and a 6 mm
mesenteric lymph node on image [DATE] common nonspecific but possibly
related to patient's known diagnosis of sarcoidosis. No
pathologically enlarged abdominal lymph nodes.

Other:  No significant abdominal free fluid.

Musculoskeletal: No acute osseous abnormality.
IMPRESSION: 1. Benign hepatic hemangiomas in the left lobe of the liver,
measuring up to 2.8 cm in corresponding with the lesion seen on
prior chest CT.
2. Prominent upper abdominal and mesenteric lymph nodes, common
nonspecific but possibly related to patient's known diagnosis of
sarcoidosis.

## 2021-03-27 MED ORDER — GADOBENATE DIMEGLUMINE 529 MG/ML IV SOLN
15.0000 mL | Freq: Once | INTRAVENOUS | Status: AC | PRN
  Administered 2021-03-27: 15 mL via INTRAVENOUS

## 2021-03-31 ENCOUNTER — Ambulatory Visit: Payer: BC Managed Care – PPO | Admitting: Physical Therapy

## 2021-04-01 ENCOUNTER — Other Ambulatory Visit: Payer: Self-pay

## 2021-04-01 ENCOUNTER — Ambulatory Visit (INDEPENDENT_AMBULATORY_CARE_PROVIDER_SITE_OTHER): Payer: BC Managed Care – PPO | Admitting: Thoracic Surgery (Cardiothoracic Vascular Surgery)

## 2021-04-01 ENCOUNTER — Encounter: Payer: Self-pay | Admitting: Thoracic Surgery (Cardiothoracic Vascular Surgery)

## 2021-04-01 VITALS — BP 146/96 | HR 81 | Resp 20 | Ht 65.5 in | Wt 154.4 lb

## 2021-04-01 DIAGNOSIS — I7410 Embolism and thrombosis of unspecified parts of aorta: Secondary | ICD-10-CM

## 2021-04-01 NOTE — Progress Notes (Signed)
° °   °  [address]       [place] [address]             [phone]      HPI:  Patient returns for routine postoperative follow-up having undergone  The patient's early postoperative recovery while in the hospital was notable for Since hospital discharge the patient reports  Past Medical History:  Diagnosis Date   Diabetes mellitus without complication ([place])    Hyperlipidemia    Hypertension    Hyponatremia    Medical history non-contributory    Stroke [facility])     Current Outpatient Medications  Medication Sig Dispense Refill   amLODipine (NORVASC) 10 MG tablet Take 1 tablet by mouth once daily 30 tablet 0   aspirin EC 81 MG tablet Take 1 tablet (81 mg total) by mouth daily. Swallow whole. 30 tablet 11   atorvastatin (LIPITOR) 40 MG tablet Take 1 tablet by mouth once daily 30 tablet 0   atorvastatin (LIPITOR) 80 MG tablet Take 1 tablet by mouth once daily 90 tablet 1   hydrochlorothiazide (MICROZIDE) 12.5 MG capsule Take 1 capsule (12.5 mg total) by mouth daily. 30 capsule 3   metFORMIN (GLUCOPHAGE) 500 MG tablet Take 1 tablet by mouth once daily with breakfast 90 tablet 1   No current facility-administered medications for this visit.    Physical Exam  Diagnostic Tests:   Impression:  Plan:   Melrose Nakayama, MD [facility] [phone]

## 2021-04-01 NOTE — Progress Notes (Signed)
[address]       [place] [address]             [phone]      HPI: Malik Jones returns to discuss the results of his MRI of the abdomen.  Malik Jones is a 61 year old man with a history of hypertension, hyperlipidemia, diabetes, ascending aortic thrombus, and stroke.  He presented back again August with a stroke.  A CT of the head and neck a showed a possible free-floating thrombus in the ascending aorta.  He was anticoagulated.  A CT angio of the chest a few days later showed no evidence of aortic thrombus.  He was treated with anticoagulation.  He went to rehab.  Also noted on the CT of the chest was mediastinal adenopathy.  There is a mention in the chart of the patient having known sarcoidosis, but he is unaware of that diagnosis.  He was seen in the office a couple of weeks ago.  CT showed his mediastinal adenopathy was stable, there was a lesion noted in the liver that was concerning.  We ordered an MRI and he now returns to discuss the result.  Past Medical History:  Diagnosis Date   Diabetes mellitus without complication ([place])    Hyperlipidemia    Hypertension    Hyponatremia    Medical history non-contributory    Stroke [facility])     Current Outpatient Medications  Medication Sig Dispense Refill   amLODipine (NORVASC) 10 MG tablet Take 1 tablet by mouth once daily 30 tablet 0   aspirin EC 81 MG tablet Take 1 tablet (81 mg total) by mouth daily. Swallow whole. 30 tablet 11   atorvastatin (LIPITOR) 40 MG tablet Take 1 tablet by mouth once daily 30 tablet 0   atorvastatin (LIPITOR) 80 MG tablet Take 1 tablet by mouth once daily 90 tablet 1   hydrochlorothiazide (MICROZIDE) 12.5 MG capsule Take 1 capsule (12.5 mg total) by mouth daily. 30 capsule 3   metFORMIN (GLUCOPHAGE) 500 MG tablet Take 1 tablet by mouth once daily with breakfast 90 tablet 1   No current facility-administered medications for this visit.    Physical Exam BP (!) 146/96 (BP  Location: Left Arm, Patient Position: Sitting, Cuff Size: Normal)    Pulse 81    Resp 20    Ht 5' 5.5" (1.664 m)    Wt 154 lb 6.4 oz (70 kg)    SpO2 97% Comment: RA   BMI 25.72 kg/m  61 year old man in no acute distress Alert and oriented x3  Lungs clear bilaterally Cardiac regular rate and rhythm No palpable cervical or supraclavicular adenopathy  Diagnostic Tests: MRI ABDOMEN WITHOUT AND WITH CONTRAST   TECHNIQUE: Multiplanar multisequence MR imaging of the abdomen was performed both before and after the administration of intravenous contrast.   CONTRAST:  83mL MULTIHANCE GADOBENATE DIMEGLUMINE 529 MG/ML IV SOLN   COMPARISON:  CT February 27, 2021   FINDINGS: Lower chest: No acute abnormality.   Hepatobiliary: No significant hepatic steatosis. Well-circumscribed 2.1 cm segment segment II hepatic lesion on image 14/5 which demonstrates arterial peripheral nodular discontinuous enhancement with filling in of the lesion similar in intensity to background aorta on delayed sequences consistent with a benign hepatic hemangioma. Additional tiny hepatic hemangioma posterior to this in the left lobe of the liver measuring 6 mm on image 14/5. Well-circumscribed T2 hyperintense 2.8 x 2.0 cm segment IV hepatic lesion on image 21/5 which demonstrates a nodular focus  of arterial enhancement with central fugal filling of the lesion on delayed imaging similar in intensity to background aorta also most consistent with a benign hepatic hemangioma.   Gallbladder is unremarkable.  No biliary ductal dilation.   Pancreas: Intrinsic T1 signal of the pancreatic parenchyma is within normal limits. Homogeneous enhancement of the pancreas postcontrast administration. No pancreatic ductal dilation. No cystic or solid hyperenhancing pancreatic lesion identified.   Spleen:  Within normal limits in size and appearance.   Adrenals/Urinary Tract: Bilateral adrenal glands appear normal.  No hydronephrosis. No solid enhancing renal mass.   Stomach/Bowel: Visualized portions within the abdomen are unremarkable.   Vascular/Lymphatic: Prominent upper abdominal and mesenteric lymph nodes for instance a left periaortic lymph node measuring 8 mm on image 36/5, a 6 mm portacaval lymph node on image 19/5 and a 6 mm mesenteric lymph node on image 30/5 common nonspecific but possibly related to patient's known diagnosis of sarcoidosis. No pathologically enlarged abdominal lymph nodes.   Other:  No significant abdominal free fluid.   Musculoskeletal: No acute osseous abnormality.   IMPRESSION: 1. 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 2. Prominent upper abdominal and mesenteric lymph nodes, common nonspecific but possibly related to patient's known diagnosis of sarcoidosis.     Electronically Signed   By: Dahlia Bailiff M.D.   On: 03/28/2021 09:54 I personally reviewed the MR images and concur with the findings noted above.  Impression:  Malik Jones is a 61 year old man who presented with a stroke back in August 2022.  CT angio of the head and neck showed a thrombus in the ascending aorta.  A CT angiogram of the chest a few days later showed the thrombus had resolved.  He was on anticoagulation during that time.  Other findings include mediastinal adenopathy and hepatic lesion.  Ascending aortic thrombus-resolved.  Treated with Eliquis for 6 months.  Now on aspirin per neurology.  Mediastinal adenopathy-no significant change from August to January.  Suspect this is probably sarcoidosis.  Needs continued follow-up.  We will repeat another CT in 3 months.  Hepatic lesion-MRI findings consistent with benign hemangioma.  No further work-up or intervention indicated.  Plan: I will plan to see him back in 3 months with a noncontrast CT of the chest to follow-up his  mediastinal adenopathy.  Melrose Nakayama, MD [facility] [phone]

## 2021-04-02 ENCOUNTER — Ambulatory Visit: Payer: BC Managed Care – PPO | Admitting: Thoracic Surgery (Cardiothoracic Vascular Surgery)

## 2021-04-02 ENCOUNTER — Ambulatory Visit: Payer: BC Managed Care – PPO | Admitting: Physical Therapy

## 2021-04-04 ENCOUNTER — Encounter: Payer: Self-pay | Admitting: Internal Medicine

## 2021-04-04 ENCOUNTER — Other Ambulatory Visit: Payer: Self-pay

## 2021-04-04 ENCOUNTER — Ambulatory Visit (INDEPENDENT_AMBULATORY_CARE_PROVIDER_SITE_OTHER): Payer: BC Managed Care – PPO | Admitting: Internal Medicine

## 2021-04-04 VITALS — BP 130/75 | HR 80 | Temp 98.6°F | Ht 65.51 in | Wt 154.0 lb

## 2021-04-04 DIAGNOSIS — D1803 Hemangioma of intra-abdominal structures: Secondary | ICD-10-CM

## 2021-04-04 DIAGNOSIS — E119 Type 2 diabetes mellitus without complications: Secondary | ICD-10-CM

## 2021-04-04 DIAGNOSIS — M6281 Muscle weakness (generalized): Secondary | ICD-10-CM | POA: Diagnosis not present

## 2021-04-04 DIAGNOSIS — I10 Essential (primary) hypertension: Secondary | ICD-10-CM

## 2021-04-04 DIAGNOSIS — R590 Localized enlarged lymph nodes: Secondary | ICD-10-CM | POA: Diagnosis not present

## 2021-04-04 DIAGNOSIS — E1169 Type 2 diabetes mellitus with other specified complication: Secondary | ICD-10-CM

## 2021-04-04 DIAGNOSIS — E785 Hyperlipidemia, unspecified: Secondary | ICD-10-CM

## 2021-04-04 LAB — BAYER DCA HB A1C WAIVED: HB A1C (BAYER DCA - WAIVED): 6.3 % — ABNORMAL HIGH (ref 4.8–5.6)

## 2021-04-04 NOTE — Progress Notes (Signed)
BP 130/75    Pulse 80    Temp 98.6 F (37 C) (Oral)    Ht 5' 5.51" (1.664 m)    Wt 154 lb (69.9 kg)    SpO2 98%    BMI 25.23 kg/m    Subjective:    Patient ID: Malik Jones, male    DOB: 1960/04/27, 61 y.o.   MRN: [id number]  Chief Complaint  Patient presents with   Hypertension    HPI: Malik Jones is a 61 y.o. male  Left side of his body is still weak per pt. Says he walks every day 3 miles for such has a limp but stable. 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  He was anticoagulated.  A CT angio of the chest a few days later showed no evidence of aortic thrombus.  He was treated with anticoagulation.  sees dr Roxan Hockey for such  CT was abnl for liver lesion and MRI was done on liver.   MRI of abdomen. 1. 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 2. Prominent upper abdominal and mesenteric lymph nodes, common nonspecific but possibly related to patient's known diagnosis of sarcoidosis.   Was on eliquis for such is on ASA 49m for CVA   Hypertension This is a chronic problem. Pertinent negatives include no chest pain, headaches, palpitations or shortness of breath.  Diabetes He presents for his follow-up diabetic visit. He has type 2 diabetes mellitus. His disease course has been improving. Pertinent negatives for hypoglycemia include no confusion, dizziness, headaches, speech difficulty or tremors. Pertinent negatives for diabetes include no chest pain, no fatigue, no polydipsia, no polyphagia, no polyuria and no weakness.     Chief Complaint  Patient presents with   Hypertension    Relevant past medical, surgical, family and social history reviewed and updated as indicated. Interim medical history since our last visit reviewed. Allergies and medications  reviewed and updated.  Review of Systems  Constitutional:  Negative for activity change, appetite change, chills, fatigue and fever.  HENT:  Negative for congestion, ear discharge, ear pain and facial swelling.   Eyes:  Negative for pain, discharge and itching.  Respiratory:  Negative for cough, chest tightness, shortness of breath and wheezing.   Cardiovascular:  Negative for chest pain, palpitations and leg swelling.  Gastrointestinal:  Negative for abdominal distention, abdominal pain, blood in stool, constipation, diarrhea, nausea and vomiting.  Endocrine: Negative for cold intolerance, heat intolerance, polydipsia, polyphagia and polyuria.  Genitourinary:  Negative for difficulty urinating, dysuria, flank pain, frequency, hematuria and urgency.  Musculoskeletal:  Negative for arthralgias, gait problem, joint swelling and myalgias.  Skin:  Negative for color change, rash and wound.  Neurological:  Negative for dizziness, tremors, speech difficulty, weakness, light-headedness, numbness and headaches.  Hematological:  Does not bruise/bleed easily.  Psychiatric/Behavioral:  Negative for agitation, confusion, decreased concentration, sleep disturbance and suicidal ideas.    Per HPI unless specifically indicated above     Objective:    BP 130/75    Pulse 80    Temp 98.6 F (37 C) (Oral)    Ht 5' 5.51" (1.664 m)    Wt 154 lb (69.9 kg)    SpO2 98%    BMI 25.23 kg/m   Wt Readings from Last 3  Encounters:  04/04/21 154 lb (69.9 kg)  04/01/21 154 lb 6.4 oz (70 kg)  03/03/21 152 lb (68.9 kg)    Physical Exam Vitals and nursing note reviewed.  Constitutional:      General: He is not in acute distress.    Appearance: Normal appearance. He is not ill-appearing or diaphoretic.  HENT:     Head: Normocephalic and atraumatic.     Right Ear: Tympanic membrane and external ear normal. There is no impacted cerumen.     Left Ear: External ear normal.     Nose: No congestion or rhinorrhea.      Mouth/Throat:     Pharynx: No oropharyngeal exudate or posterior oropharyngeal erythema.  Eyes:     Conjunctiva/sclera: Conjunctivae normal.     Pupils: Pupils are equal, round, and reactive to light.  Cardiovascular:     Rate and Rhythm: Normal rate and regular rhythm.     Heart sounds: No murmur heard.   No friction rub. No gallop.  Pulmonary:     Effort: No respiratory distress.     Breath sounds: No stridor. No wheezing or rhonchi.  Chest:     Chest wall: No tenderness.  Abdominal:     General: Abdomen is flat. Bowel sounds are normal.     Palpations: Abdomen is soft. There is no mass.     Tenderness: There is no abdominal tenderness.  Musculoskeletal:     Cervical back: Normal range of motion and neck supple. No rigidity or tenderness.     Left lower leg: No edema.  Skin:    General: Skin is warm and dry.  Neurological:     Mental Status: He is alert.    Results for orders placed or performed in visit on 11/27/20  Bayer DCA Hb A1c Waived  Result Value Ref Range   HB A1C (BAYER DCA - WAIVED) 6.4 (H) 4.8 - 5.6 %  Lipid panel  Result Value Ref Range   Cholesterol, Total 153 100 - 199 mg/dL   Triglycerides 145 0 - 149 mg/dL   HDL 43 >39 mg/dL   VLDL Cholesterol Cal 25 5 - 40 mg/dL   LDL Chol Calc (NIH) 85 0 - 99 mg/dL   Chol/HDL Ratio 3.6 0.0 - 5.0 ratio  Vitamin B12  Result Value Ref Range   Vitamin B-12 371 232 - 1,245 pg/mL  Urinalysis, Routine w reflex microscopic  Result Value Ref Range   Specific Gravity, UA 1.015 1.005 - 1.030   pH, UA 6.5 5.0 - 7.5   Color, UA Yellow Yellow   Appearance Ur Clear Clear   Leukocytes,UA Negative Negative   Protein,UA Negative Negative/Trace   Glucose, UA Negative Negative   Ketones, UA Negative Negative   RBC, UA Negative Negative   Bilirubin, UA Negative Negative   Urobilinogen, Ur 0.2 0.2 - 1.0 mg/dL   Nitrite, UA Negative Negative  CBC with Differential/Platelet  Result Value Ref Range   WBC 6.8 3.4 - 10.8 x10E3/uL    RBC 4.27 4.14 - 5.80 x10E6/uL   Hemoglobin 13.3 13.0 - 17.7 g/dL   Hematocrit 39.7 37.5 - 51.0 %   MCV 93 79 - 97 fL   MCH 31.1 26.6 - 33.0 pg   MCHC 33.5 31.5 - 35.7 g/dL   RDW 12.9 11.6 - 15.4 %   Platelets 375 150 - 450 x10E3/uL   Neutrophils 67 Not Estab. %   Lymphs 20 Not Estab. %   Monocytes 10 Not Estab. %  Eos 2 Not Estab. %   Basos 1 Not Estab. %   Neutrophils Absolute 4.5 1.4 - 7.0 x10E3/uL   Lymphocytes Absolute 1.4 0.7 - 3.1 x10E3/uL   Monocytes Absolute 0.7 0.1 - 0.9 x10E3/uL   EOS (ABSOLUTE) 0.1 0.0 - 0.4 x10E3/uL   Basophils Absolute 0.1 0.0 - 0.2 x10E3/uL   Immature Granulocytes 0 Not Estab. %   Immature Grans (Abs) 0.0 0.0 - 0.1 x10E3/uL  Comprehensive metabolic panel  Result Value Ref Range   Glucose 110 (H) 70 - 99 mg/dL   BUN 12 6 - 24 mg/dL   Creatinine, Ser 0.95 0.76 - 1.27 mg/dL   eGFR 93 >59 mL/min/1.73   BUN/Creatinine Ratio 13 9 - 20   Sodium 137 134 - 144 mmol/L   Potassium 4.2 3.5 - 5.2 mmol/L   Chloride 97 96 - 106 mmol/L   CO2 22 20 - 29 mmol/L   Calcium 9.9 8.7 - 10.2 mg/dL   Total Protein 8.3 6.0 - 8.5 g/dL   Albumin 5.1 (H) 3.8 - 4.9 g/dL   Globulin, Total 3.2 1.5 - 4.5 g/dL   Albumin/Globulin Ratio 1.6 1.2 - 2.2   Bilirubin Total 0.4 0.0 - 1.2 mg/dL   Alkaline Phosphatase 66 44 - 121 IU/L   AST 20 0 - 40 IU/L   ALT 29 0 - 44 IU/L        Current Outpatient Medications:    amLODipine (NORVASC) 10 MG tablet, Take 1 tablet by mouth once daily, Disp: 30 tablet, Rfl: 0   aspirin EC 81 MG tablet, Take 1 tablet (81 mg total) by mouth daily. Swallow whole., Disp: 30 tablet, Rfl: 11   hydrochlorothiazide (MICROZIDE) 12.5 MG capsule, Take 1 capsule (12.5 mg total) by mouth daily., Disp: 30 capsule, Rfl: 3   metFORMIN (GLUCOPHAGE) 500 MG tablet, Take 1 tablet by mouth once daily with breakfast, Disp: 90 tablet, Rfl: 1   atorvastatin (LIPITOR) 80 MG tablet, Take 1 tablet by mouth once daily, Disp: 90 tablet, Rfl: 1   IMPRESSION: 1. Normal  contour and caliber of the thoracic aorta. Mild atherosclerosis. No evidence of intraluminal thrombus. 2. No significant change in extensive, upper lobe predominant clustered, and confluent irregular nodularity and associated mild architectural distortion, generally in a perilymphatic distribution with some nodules concentrated along the fissures. 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 3. Numerous prominent mediastinal and bilateral hilar lymph nodes, likely as above representing nodal sarcoidosis. 4. Coronary artery disease. 5. Somewhat coarse, nodular contour of the liver in the included upper abdomen, suggestive of cirrhosis. 6. There is a subcapsular, internally hyperenhancing lesion of the anterior left lobe of the liver, hepatic segment III, which is ill-defined but approximately 3.1 x 2.4 cm. Although this may reflect a benign hemangioma, this lesion is incompletely characterized on this single phase early arterial examination. Recommend multiphasic contrast enhanced MRI of the liver to fully characterize.   Aortic Atherosclerosis (ICD10-I70.0). Assessment & Plan:  Left sided weakness sec to CVA On ASA 81 mg for such stopped eliquis sees neurology next month   2. Mediastinal mass  Will refer to pulmonology ? Sec to sarcoidosis per imaging.   3. DM is on metformin for such wil recheck a1c    DM check HbA1c,  urine  microalbumin  diabetic diet plan given to pt  adviced regarding hypoglycemia and instructions given to pt today on how to prevent and treat the same if it were to occur. pt acknowledges the plan and voices understanding  of the same.  exercise plan given and encouraged.   advice diabetic yearly podiatry, ophthalmology , nutritionist , dental check q 6 months,   4. HLD is on lipitor 80  mg daily recheck FLP, check LFT's work on diet, SE of meds explained to pt. low fat and high fiber diet explained to pt.  5HTN :  Continue current meds.   Medication compliance emphasised. pt advised to keep Bp logs. Pt verbalised understanding of the same. Pt to have a low salt diet . Exercise to reach a goal of at least 150 mins a week.  lifestyle modifications explained and pt understands importance of the above. Under good control on current regimen. Continue current regimen. Continue to monitor. Call with any concerns. Refills given. Labs drawn today.   Problem List Items Addressed This Visit       Cardiovascular and Mediastinum   Essential hypertension   Mediastinal lymphadenopathy - Primary   Relevant Orders   Ambulatory referral to Pulmonology   Hepatic hemangioma     Endocrine   Diabetes mellitus, new onset ([place])   Hyperlipidemia associated with type 2 diabetes mellitus ([place])   Relevant Orders   Bayer DCA Hb A1c Waived (STAT)     Other   Muscle weakness (generalized)     Orders Placed This Encounter  Procedures   Bayer DCA Hb A1c Waived (STAT)   Ambulatory referral to Pulmonology     No orders of the defined types were placed in this encounter.    Follow up plan: Return in about 3 months (around 07/02/2021).

## 2021-04-05 LAB — COMPREHENSIVE METABOLIC PANEL
ALT: 30 IU/L (ref 0–44)
AST: 24 IU/L (ref 0–40)
Albumin/Globulin Ratio: 2.3 — ABNORMAL HIGH (ref 1.2–2.2)
Albumin: 5 g/dL — ABNORMAL HIGH (ref 3.8–4.9)
Alkaline Phosphatase: 63 IU/L (ref 44–121)
BUN/Creatinine Ratio: 13 (ref 9–20)
BUN: 11 mg/dL (ref 6–24)
Bilirubin Total: 0.3 mg/dL (ref 0.0–1.2)
CO2: 25 mmol/L (ref 20–29)
Calcium: 10 mg/dL (ref 8.7–10.2)
Chloride: 100 mmol/L (ref 96–106)
Creatinine, Ser: 0.86 mg/dL (ref 0.76–1.27)
Globulin, Total: 2.2 g/dL (ref 1.5–4.5)
Glucose: 102 mg/dL — ABNORMAL HIGH (ref 70–99)
Potassium: 4 mmol/L (ref 3.5–5.2)
Sodium: 138 mmol/L (ref 134–144)
Total Protein: 7.2 g/dL (ref 6.0–8.5)
eGFR: 100 mL/min/{1.73_m2} (ref >59)

## 2021-04-05 LAB — LIPID PANEL W/O CHOL/HDL RATIO

## 2021-04-06 LAB — LIPID PANEL W/O CHOL/HDL RATIO
Cholesterol, Total: 92 mg/dL — ABNORMAL LOW (ref 100–199)
HDL: 38 mg/dL — ABNORMAL LOW (ref >39)
LDL Chol Calc (NIH): 38 mg/dL (ref 0–99)
Triglycerides: 79 mg/dL (ref 0–149)
VLDL Cholesterol Cal: 16 mg/dL (ref 5–40)

## 2021-04-06 LAB — SPECIMEN STATUS REPORT

## 2021-04-07 ENCOUNTER — Ambulatory Visit: Payer: BC Managed Care – PPO | Admitting: Physical Therapy

## 2021-04-09 ENCOUNTER — Ambulatory Visit: Payer: BC Managed Care – PPO | Admitting: Physical Therapy

## 2021-04-20 ENCOUNTER — Other Ambulatory Visit: Payer: Self-pay | Admitting: Physical Medicine and Rehabilitation

## 2021-04-21 ENCOUNTER — Other Ambulatory Visit: Payer: BC Managed Care – PPO

## 2021-04-28 ENCOUNTER — Ambulatory Visit: Payer: BC Managed Care – PPO | Admitting: Internal Medicine

## 2021-05-07 ENCOUNTER — Other Ambulatory Visit: Payer: Self-pay | Admitting: Physical Medicine and Rehabilitation

## 2021-05-13 ENCOUNTER — Ambulatory Visit (INDEPENDENT_AMBULATORY_CARE_PROVIDER_SITE_OTHER): Payer: BC Managed Care – PPO | Admitting: Pulmonary Disease

## 2021-05-13 ENCOUNTER — Encounter: Payer: Self-pay | Admitting: Pulmonary Disease

## 2021-05-13 VITALS — BP 120/80 | HR 93 | Temp 97.8°F | Ht 65.0 in | Wt 151.6 lb

## 2021-05-13 DIAGNOSIS — E559 Vitamin D deficiency, unspecified: Secondary | ICD-10-CM | POA: Diagnosis not present

## 2021-05-13 DIAGNOSIS — D1803 Hemangioma of intra-abdominal structures: Secondary | ICD-10-CM

## 2021-05-13 DIAGNOSIS — R590 Localized enlarged lymph nodes: Secondary | ICD-10-CM | POA: Diagnosis not present

## 2021-05-13 DIAGNOSIS — I7410 Embolism and thrombosis of unspecified parts of aorta: Secondary | ICD-10-CM

## 2021-05-13 DIAGNOSIS — D869 Sarcoidosis, unspecified: Secondary | ICD-10-CM | POA: Diagnosis not present

## 2021-05-13 DIAGNOSIS — I639 Cerebral infarction, unspecified: Secondary | ICD-10-CM | POA: Diagnosis not present

## 2021-05-13 NOTE — Progress Notes (Signed)
Subjective:    Patient ID: Malik Jones, male    DOB: [birth year], 61 y.o.   MRN: [id number] Patient Care Team: Loura Pardon, MD as PCP - General (Internal Medicine)  Chief Complaint  Patient presents with   pulmonary consult    HPI Malik Jones is a 61 year old lifelong never smoker evaluation of sarcoid.  He is kindly referred by Dr. Loura Pardon.  The patient was noted to have findings of consolidation of bilateral upper lobes mediastinal adenopathy on a CT angio chest performed January 2023.  These findings however go as far back as August 2022.  They are consistent with sarcoidosis.  With regards to these findings the patient has been asymptomatic.  Does not endorse any shortness of breath, cough, production or hemoptysis.  He has not had any fevers, chills or sweats.  No weight loss or anorexia. He has not had any arthralgias myalgias or skin rashes.   An abdominal MRI performed February 2023 showed prominent upper abdominal mesenteric lymph nodes possibly related to sarcoidosis.  Patient has had a complex medical history had a cerebellar stroke in August 2022 and has some significant neuro deficits.  He has had issues with an aortic thrombus and is followed by thoracic surgery.  Gets image frequently to monitor this issue.  I have reviewed the patient's imaging and laboratory data.  He simply had vitamin D level that was low.  He inquires as to what type of vitamin D he can take.  He does not endorse any other symptomatology.    Review of Systems A 10 point review of systems was performed and it is as noted above otherwise negative.  Past Medical History:  Diagnosis Date   Diabetes mellitus without complication (HCC)    Hyperlipidemia    Hypertension    Hyponatremia    Medical history non-contributory    Stroke [facility])    Past Surgical History:  Procedure Laterality Date   MANDIBLE FRACTURE SURGERY     NO PAST SURGERIES     Patient Active Problem List   Diagnosis Date  Noted   Mediastinal lymphadenopathy 04/04/2021   Hepatic hemangioma 04/04/2021   Diabetes mellitus without complication (HCC) 01/27/2021   Screen for colon cancer 01/27/2021   Need for influenza vaccination 01/27/2021   Hyperlipidemia associated with type 2 diabetes mellitus (HCC) 12/23/2020   Muscle weakness (generalized) 12/05/2020   Abnormality of gait and mobility 12/05/2020   Unsteadiness on feet 12/05/2020   Other abnormalities of gait and mobility 12/05/2020   Difficulty in walking, not elsewhere classified 12/05/2020   Other lack of coordination 12/05/2020   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/2020   Mixed hyperlipidemia 11/27/2020   Primary hypertension 11/27/2020   Vitamin D deficiency 10/04/2020   Mediastinal adenopathy 10/04/2020   Hyponatremia    Transaminitis    Diabetes mellitus, new onset (HCC)    Malnutrition of moderate degree 09/13/2020   Stroke (HCC) 09/12/2020   Aortic thrombus (HCC)    Hyperlipidemia    Essential hypertension    Impaired fasting glucose    Cerebellar stroke (HCC) 09/11/2020   Family History  Problem Relation Age of Onset   Kidney disease Mother    Hip fracture Father    Social History   Tobacco Use   Smoking status: Never   Smokeless tobacco: Never  Substance Use Topics   Alcohol use: Yes    Alcohol/week: 6.0 standard drinks    Types: 6 Cans of beer per week  No Known Allergies  Current Meds  Medication Sig   amLODipine (NORVASC) 10 MG tablet Take 1 tablet by mouth once daily   aspirin EC 81 MG tablet Take 1 tablet (81 mg total) by mouth daily. Swallow whole.   atorvastatin (LIPITOR) 80 MG tablet Take 1 tablet by mouth once daily   hydrochlorothiazide (MICROZIDE) 12.5 MG capsule Take 1 capsule (12.5 mg total) by mouth daily.   metFORMIN (GLUCOPHAGE) 500 MG tablet Take 1 tablet by mouth once daily with breakfast   Immunization History  Administered Date(s) Administered   Influenza,inj,Quad  PF,6+ Mos 01/27/2021   Moderna Sars-Covid-2 Vaccination 05/03/2019, 05/31/2019, 12/17/2020   Pneumococcal Polysaccharide-23 09/20/2020       Objective:   Physical Exam BP 120/80 (BP Location: Left Arm, Cuff Size: Normal)   Pulse 93   Temp 97.8 F (36.6 C) (Temporal)   Ht 5\' 5"  (1.651 m)   Wt 151 lb 9.6 oz (68.8 kg)   SpO2 94%   BMI 25.23 kg/m   GENERAL: Well-developed, well-nourished, no acute distress.  Ataxic gait, ambulates without assistive device. HEAD: Normocephalic, atraumatic.  EYES: Pupils equal, round, reactive to light.  No scleral icterus.  MOUTH:, Oral mucosa moist.  No thrush. NECK: Supple. No thyromegaly. Trachea midline. No JVD.  No adenopathy. PULMONARY: Good air entry bilaterally.  No adventitious sounds. CARDIOVASCULAR: S1 and S2. Regular rate and rhythm.  No rubs, murmurs or gallops heard. ABDOMEN: Benign. MUSCULOSKELETAL: No joint deformity, no clubbing, no edema.  NEUROLOGIC: Ataxic gait to right with mild unsteadiness, left hand sensory impairment, mild right hand weakness. SKIN: Intact,warm,dry. PSYCH:  Representative images from CT chest performed 27 February 2021 showing upper lobe predominant nodularity consistent with sarcoidosis, mediastinal nodes noted on mediastinal windows.  No changes from prior:        Assessment & Plan:     ICD-10-CM   1. Sarcoidosis  D86.9 Pulmonary Function Test [facility] Only   Will assess with PFTs No indication of need for therapy at present Patient asymptomatic    2. Mediastinal lymphadenopathy  R59.0    Related to sarcoidosis    3. Cerebellar stroke [facility]) -09/11/2020  I63.9    Follows with neurology    4. Vitamin D deficiency  E55.9    Recommend patient supplement with vitamin D3 Can get OTC Follow-up with primary care in this regard    5. 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  Will see him in follow-up in 6 months time he is to call sooner should any new problems arise.  Just that if he gets flareup of sarcoidosis he may feel like he has "super flu".  Advised that if he starts having symptoms like night sweats, fatigue, shortness of breath, cough etc. he is to call us to see him sooner.  Gailen Shelter, MD Advanced Bronchoscopy PCCM [facility]    *This note was dictated using voice recognition software/Dragon.  Despite best efforts to proofread, errors can occur which can change the meaning. Any transcriptional errors that result from this process are unintentional and may not be fully corrected at the time of dictation.

## 2021-05-13 NOTE — Patient Instructions (Signed)
As we discussed I think you have a condition called sarcoidosis this is usually inflammation in the lungs and sometimes can be throughout the whole body.  In your situation it appears that this is "burnt out" meaning that it is not active. ? ?I would get some breathing tests just so we have those as baseline in case you have difficulties later on with this. ? ?You also have noted to have a low vitamin D level.  I recommend that you take a vitamin supplement the best to take his D3 2000 units (50 mcg) daily.  It is best to take it early in the morning.  You can find this over-the-counter.  An example is as below: ? ? ? ? ?We will see you in follow-up in 6 months time call sooner should any new problems arise.  Member that the symptoms of sarcoid is like if you are having a "super flu" if you feel that you are starting to have symptoms like that such as night sweats, fatigue, shortness of breath, cough etc. let us know so we can see you sooner. ?

## 2021-05-15 ENCOUNTER — Other Ambulatory Visit: Payer: Self-pay | Admitting: Thoracic Surgery (Cardiothoracic Vascular Surgery)

## 2021-05-15 DIAGNOSIS — R590 Localized enlarged lymph nodes: Secondary | ICD-10-CM

## 2021-05-15 DIAGNOSIS — I7410 Embolism and thrombosis of unspecified parts of aorta: Secondary | ICD-10-CM

## 2021-06-12 ENCOUNTER — Encounter: Payer: Self-pay | Admitting: Adult Health

## 2021-06-12 ENCOUNTER — Ambulatory Visit (INDEPENDENT_AMBULATORY_CARE_PROVIDER_SITE_OTHER): Payer: BC Managed Care – PPO | Admitting: Adult Health

## 2021-06-12 ENCOUNTER — Telehealth: Payer: Self-pay | Admitting: Adult Health

## 2021-06-12 VITALS — BP 147/93 | HR 76 | Ht 65.0 in | Wt 151.0 lb

## 2021-06-12 DIAGNOSIS — I7410 Embolism and thrombosis of unspecified parts of aorta: Secondary | ICD-10-CM | POA: Diagnosis not present

## 2021-06-12 DIAGNOSIS — I69393 Ataxia following cerebral infarction: Secondary | ICD-10-CM

## 2021-06-12 DIAGNOSIS — I639 Cerebral infarction, unspecified: Secondary | ICD-10-CM

## 2021-06-12 NOTE — Telephone Encounter (Addendum)
Pt has called to provide the name phone and address to the case worker the letter needs to go to: Ul[employer] Rias ph#[phone] ?Ulis Rias ?[address] ?[place],  [place] [address] ? ?Pt is also asking for a letter to be sent to him  ?As well stating that he is unable to work.  Pt states his sister will use that re: assisting him in applying for disability.  Pt confirmed his mailing address to be  [address] ?[place] [address] ?

## 2021-06-12 NOTE — Patient Instructions (Addendum)
Continue aspirin 81 mg daily  and atorvastatin for secondary stroke prevention ? ?Please call office with further information of where to send child support letter ? ?Continue to follow up with PCP regarding cholesterol, prediabetes and blood pressure management  ?Maintain strict control of hypertension with blood pressure goal below 130/90, pre-diabetes with hemoglobin A1c goal below 7.0 % and cholesterol with LDL cholesterol (bad cholesterol) goal below 70 mg/dL.  ? ?Signs of a Stroke? Follow the BEFAST method:  ?Balance Watch for a sudden loss of balance, trouble with coordination or vertigo ?Eyes Is there a sudden loss of vision in one or both eyes? Or double vision?  ?Face: Ask the person to smile. Does one side of the face droop or is it numb?  ?Arms: Ask the person to raise both arms. Does one arm drift downward? Is there weakness or numbness of a leg? ?Speech: Ask the person to repeat a simple phrase. Does the speech sound slurred/strange? Is the person confused ? ?Time: If you observe any of these signs, call 911. ? ? ? ?Follow-up in 6 months or call earlier if needed ? ? ? ?Thank you for coming to see [place] at [facility]. I hope we have been able to provide you high quality care today. ? ?You may receive a patient satisfaction survey over the next few weeks. We would appreciate your feedback and comments so that we may continue to improve ourselves and the health of our patients. ? ?

## 2021-06-12 NOTE — Telephone Encounter (Signed)
Will discuss with NP. ?

## 2021-06-12 NOTE — Progress Notes (Signed)
?[facility] ?[address] ?[place]. [place] [address] ?[phone] ? ?     STROKE FOLLOW UP NOTE ? ?[name] ?Date of Birth:  1960/07/12 ?Medical Record Number:  [id number]  ? ?Reason for Referral: stroke follow up ? ? ? ?SUBJECTIVE: ? ? ?CHIEF COMPLAINT:  ?Chief Complaint  ?Patient presents with  ? Follow-up  ?  RM 3 Alone ?Pt is well, having tempeture sensitivity on L side. Overall stable   ? ? ?HPI:  ? ?Update 06/12/2021 JM: Patient returns for 66-monthstroke follow-up.  Overall stable from stroke standpoint without new stroke/TIA symptoms.  Reports stable residual left-sided sensory impairment, right-sided ataxia and gait impairment.  Tries to keep active but does need to take breaks frequently.  Currently in the process of applying for Social Security disability, completed short-term disability and does not have long term disability. F/u with cardiothoracic Dr. HRoxan Hockeywith repeat CTA chest without evidence of residual thrombus, Eliquis discontinued and transition to aspirin 81 mg daily.  He has remained on aspirin and atorvastatin, denies side effects.  Blood pressure today 147/93.  Apparently, prior PCP left office and has scheduled visit on 5/20 with new PCP.  No further concerns at this time. ? ? ? ?History provided for reference purposes only ?Update 02/13/2020 JM: Mr FDemetrius Jones here today alone for a stroke follow-up. His brother brought him to prior visit but unfortunately he passed away 2 nights ago.  Reports improving left-sided sensory impairment with occasional numbness, gait impairment and right sided ataxia with weak right hand. He has completed therapies. Remains on disability - initially started by PMR but he has been released from their office and per patient, PCP refused completing forms as he was not an active patient prior to stroke.  He does not feel as though he would be able to return back to work at [employer] the [employer] section as he can only ambulate for  short distance and has difficulty functioning with left hand due to numbness and right hand due to weakness which makes picking up large/heavy objects difficult but otherwise good function with RUE. Denies new stroke/TIA symptoms. Lives alone performing ADLs and IADLs independently. Remains on Eliquis and Atorvastatin without side effects. BP today 137/84. Labs A1c 6.4, LDL 85 11/27/2020.  No further concerns at this time. ? ?Initial visit 11/07/2020 JM: Mr. [name] being seen for hospital follow-up unaccompanied (brother waiting in lobby).  Reports left-sided sensory impairment with occasional numbness, right sided ataxia and imbalance. Gradually improving currently working with PT/OT.  [facility] -denies any recent falls.  He lives alone but has supportive family checking on him frequently.  Able to maintain ADLs indendently and has been gradually returning back to some IADLs.  He has not yet returned back to work at [employer] [place].  Denies new stroke/TIA symptoms.  Compliant on Eliquis and atorvastatin without side effects.  Blood pressure today 139/89.  Does not have a BP cuff at home to monitor.  Compliant on amlodipine and hydrochlorothiazide.  He has an appointment to establish care with PCP on 10/19.  No further concerns at this time. ? ?Stroke admission 09/11/2020 ?LAdiel Erneyis a 61y.o. male with no significant medical history other than marijuana use [facility] disturbance and clumsiness on the right upper and lower extremities that started the night prior.  CT  head showed acute/subacute right superior cerebellar infarction as well as remote left superior cerebellar infarct.  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  He was transferred to [facility] on 8/4 for further evaluation and care and  possible intervention by cardiothoracic surgery.  Personally reviewed hospitalization pertinent progress notes, lab work and imaging.  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  EF 70 to 75% without cardiac source of embolus identified.  LDL 169.  A1c 6.5.  Remains on IV heparin during hospitalization and recommend transitioning to Eliquis at discharge for 3 to 6 months and then repeat CT angio and if clot resolved switch to antiplatelet therapy.  Continue to be monitored by cardiology.  UDS positive THC.  Initiated atorvastatin 40 mg daily. Eval by Dr. Stanford Breed for input on anticoagulation and recommended nonemergent cardiac surgery evaluation in the future.  Dr. Roxan Hockey consulted recommend repeat CTA chest showing previously identified adherent thrombus no longer visible.  Recommend repeat CT chest to 3 months to monitor for resolution of mediastinal adenopathy likely infectious in nature.  PT/OT/SLP recommended CIR for residual right hemiparesis with ataxia, left sensory deficits and deficits in higher level cognitive tasks. ? ? ? ? ?PERTINENT IMAGING ? ?CT HEAD 06/11/2020 ?IMPRESSION: ?1. Acute/subacute nonhemorrhagic right superior cerebellar infarct. ?2. Remote left superior cerebellar infarct. ?3. Atherosclerosis. ? ?CTA HEAD NECK 09/28/2020 ?IMPRESSION: ?Suboptimal contrast bolus timing. ?  ?Focal free floating or adherent thrombus along the ascending ?thoracic aorta. ?  ?Major cerebellar artery origins are not well evaluated due to ?contrast timing. Probable patent right superior cerebellar artery ?origin with suspected subsequent occlusion. ?  ?Perfusion imaging is likely providing incomplete information in the ?posterior fossa. There is 3 mL of penumbra identified partially ?corresponding to the area of right superior cerebellar infarction on ?the noncontrast head CT. ?  ?Foci of nodular consolidation in the visualized lungs suspicious for ?pneumonia. Reactive mediastinal  adenopathy. ? ?MR BRAIN WO CONTRAST 09/13/2020 ?IMPRESSION: ?1. Acute/subacute large right superior cerebellar artery territory ?infarct. Mild mass effect on the fourth ventricle which remains ?patent. No hydrocephalus. No evidence of hemorrhagic transformation. ?2. Remote left superior cerebellar artery territory infarct. ?3. Mild-to-moderate chronic microvascular ischemic changes of the ?white matter. ? ?CTA CHEST 09/17/2019 ?IMPRESSION: ?1. The previously identified wall adherent thrombus in the anterior ?wall of the ascending thoracic aorta is no longer visualized. No ?other filling defects identified in the thoracic aorta or within the ?heart. ?2. Nodular consolidation cenetered in the bilateral upper lobes with ?reactive mediastinal lymphadenopathy is most likely infectious ?pneumonia. Consider follow-up CT chest in 2-3 months to ensure ?resolution. ? ? ? ? ? ?ROS:   ?14 system review of systems performed and negative with exception of those listed in HPI ? ?PMH:  ?Past Medical History:  ?Diagnosis Date  ? Diabetes mellitus without complication ([place])   ? Hyperlipidemia   ? Hypertension   ? Hyponatremia   ? Medical history non-contributory   ? Stroke [facility])   ? ? ?PSH:  ?Past Surgical History:  ?Procedure Laterality Date  ? MANDIBLE FRACTURE SURGERY    ? NO PAST SURGERIES    ? ? ?Social History:  ?Social History  ? ?Socioeconomic History  ? Marital status: Single  ?  Spouse name: Not on file  ? Number of children: Not on file  ? Years of education: Not on file  ? Highest education level: Not on file  ?Occupational History  ? Not  on file  ?Tobacco Use  ? Smoking status: Never  ? Smokeless tobacco: Never  ?Vaping Use  ? Vaping Use: Never used  ?Substance and Sexual Activity  ? Alcohol use: Yes  ?  Alcohol/week: 6.0 standard drinks  ?  Types: 6 Cans of beer per week  ? Drug use: Yes  ?  Types: Marijuana  ? Sexual activity: Not Currently  ?Other Topics Concern  ? Not on file  ?Social History Narrative  ? Not on file   ? ?Social Determinants of Health  ? ?Financial Resource Strain: Not on file  ?Food Insecurity: Not on file  ?Transportation Needs: Not on file  ?Physical Activity: Not on file  ?Stress: Not on file  ?Social [employer]

## 2021-06-12 NOTE — Telephone Encounter (Signed)
I spoke with Malik Jones and she agreed to write the letter. We formulated and she signed. I called pt back updated and advised letters would be placed in out going mail today.  ? ?Pt will keep [place] up to date if there are any issues on this.  ?

## 2021-06-13 ENCOUNTER — Other Ambulatory Visit: Payer: Self-pay | Admitting: Physical Medicine and Rehabilitation

## 2021-06-30 ENCOUNTER — Other Ambulatory Visit: Payer: Self-pay | Admitting: Physical Medicine and Rehabilitation

## 2021-07-01 ENCOUNTER — Encounter: Payer: Self-pay | Admitting: Thoracic Surgery (Cardiothoracic Vascular Surgery)

## 2021-07-01 ENCOUNTER — Ambulatory Visit
Admission: RE | Admit: 2021-07-01 | Discharge: 2021-07-01 | Disposition: A | Discharge status: Home / Self Care | Payer: BC Managed Care – PPO | Source: Ambulatory Visit | Attending: Thoracic Surgery (Cardiothoracic Vascular Surgery) | Admitting: Thoracic Surgery (Cardiothoracic Vascular Surgery)

## 2021-07-01 ENCOUNTER — Ambulatory Visit (INDEPENDENT_AMBULATORY_CARE_PROVIDER_SITE_OTHER): Payer: BC Managed Care – PPO | Admitting: Thoracic Surgery (Cardiothoracic Vascular Surgery)

## 2021-07-01 VITALS — BP 147/90 | HR 83 | Resp 20 | Ht 65.0 in | Wt 153.0 lb

## 2021-07-01 DIAGNOSIS — R590 Localized enlarged lymph nodes: Secondary | ICD-10-CM | POA: Diagnosis not present

## 2021-07-01 DIAGNOSIS — I700 Atherosclerosis of aorta: Secondary | ICD-10-CM | POA: Diagnosis not present

## 2021-07-01 DIAGNOSIS — R911 Solitary pulmonary nodule: Secondary | ICD-10-CM | POA: Diagnosis not present

## 2021-07-01 IMAGING — CT CT CHEST W/O CM
2 of 5 series · 13 of 36 positions shown, 16 images · non-contrast
Comparison: CT imaging from [DATE]. Also with imaging
from [DATE] for comparison.

CLINICAL DATA: Question of mediastinal mass.



[Series 4: chest 2.00 br40 s3 · coronal · 0.55mm/px · 3 of 135 slices shown]
[im 27/135  lung]
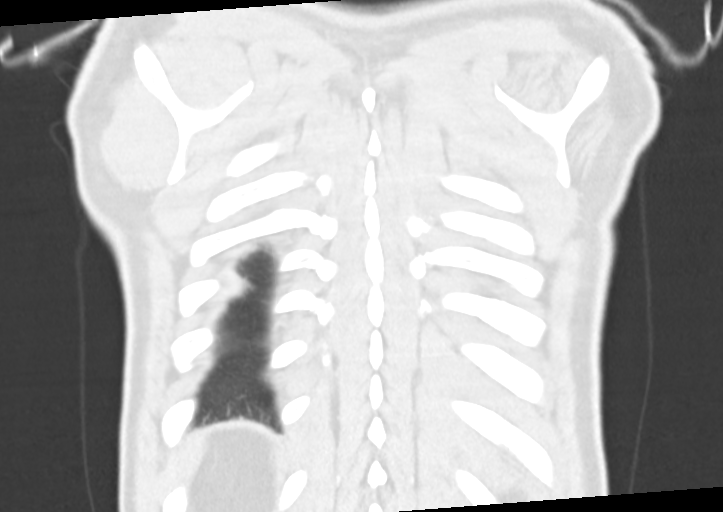
[im 54/135  lung]
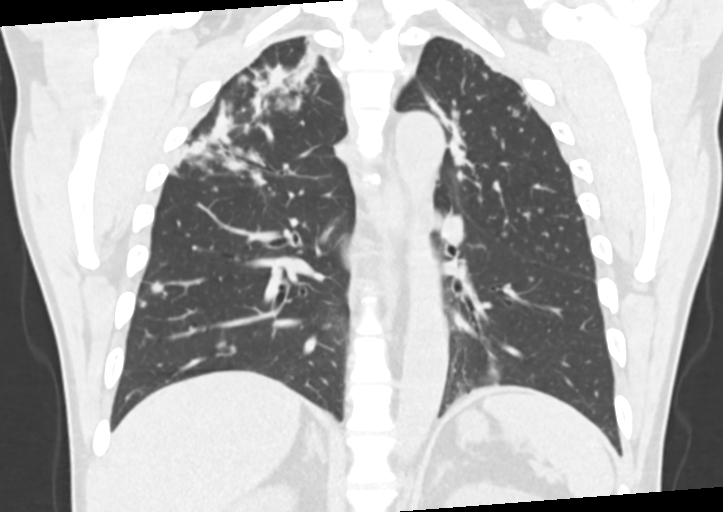
[im 81/135  lung]
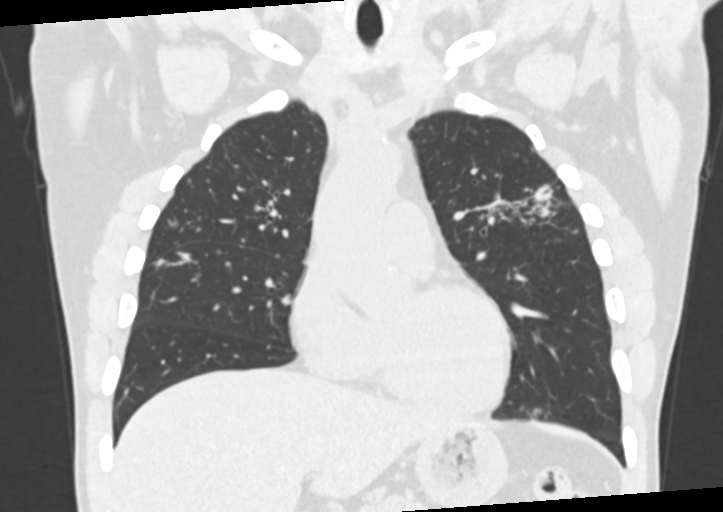

[Series 10: chest 1.00 br40 s3 super d · axial · 0.78mm/px · z∈[+1400,+1643]mm · 10 of 351 slices shown, 13 images]
[im 24/351  mediastinal]
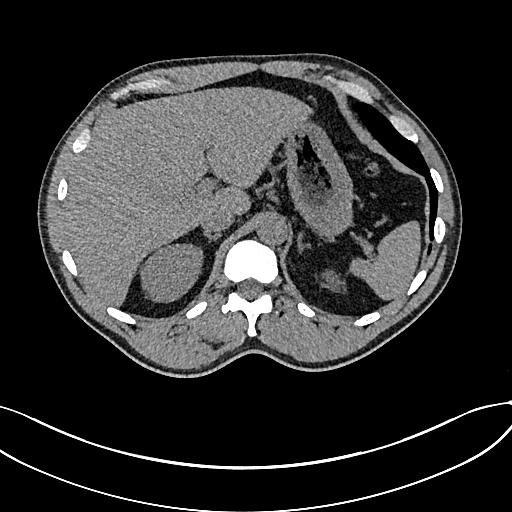
[im 24/351  lung]
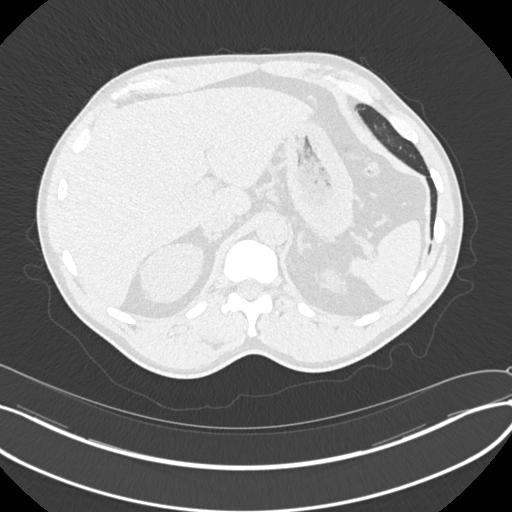
[im 71/351  lung]
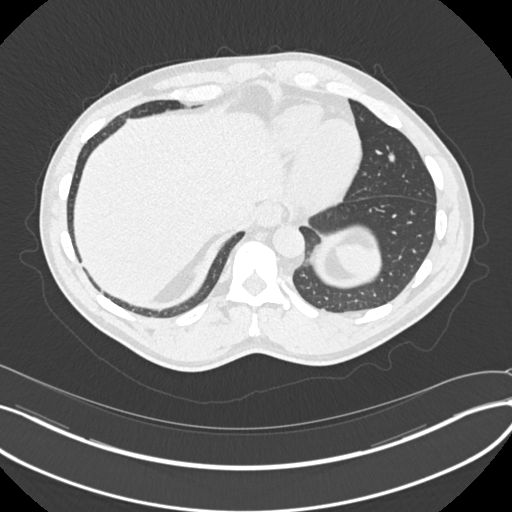
[im 94/351  lung]
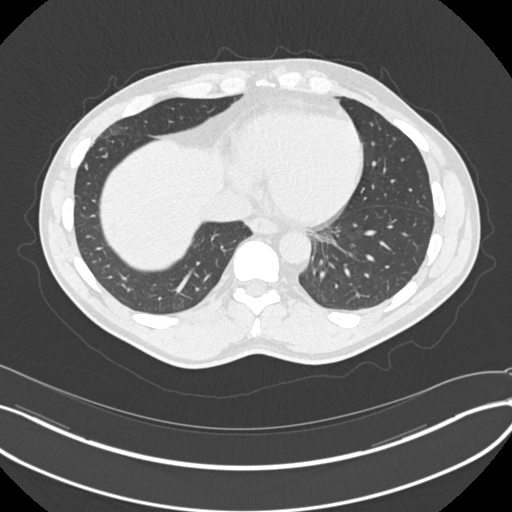
[im 117/351  lung]
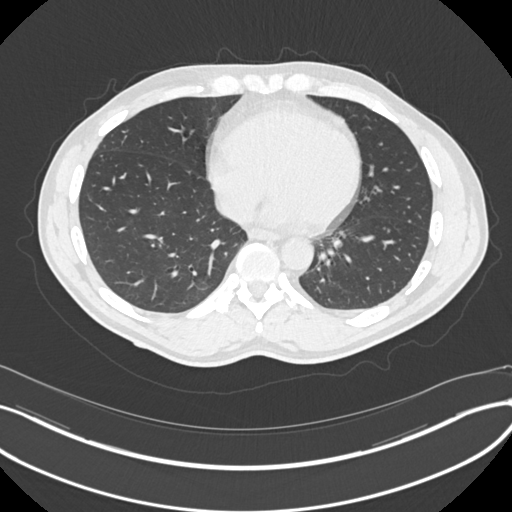
[im 164/351  mediastinal]
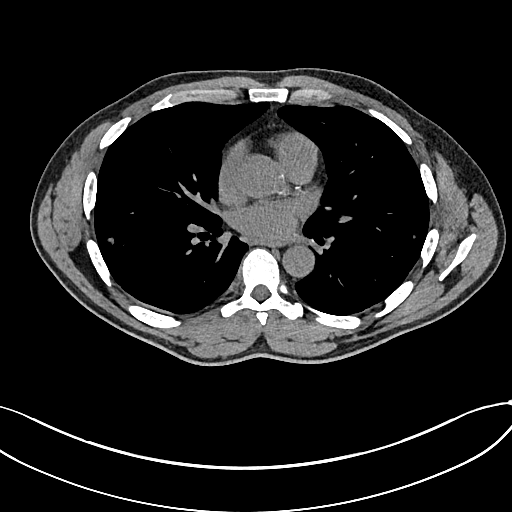
[im 164/351  lung]
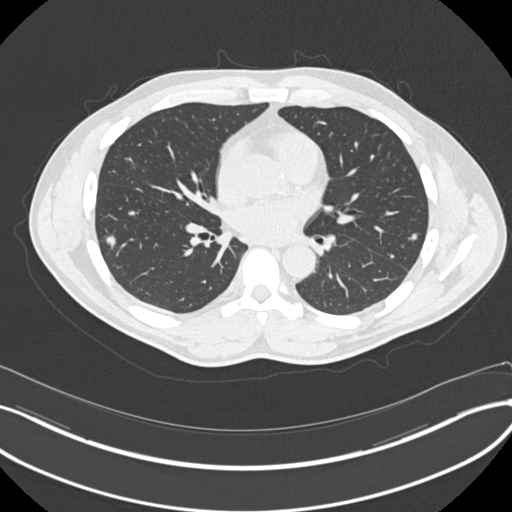
[im 187/351  lung]
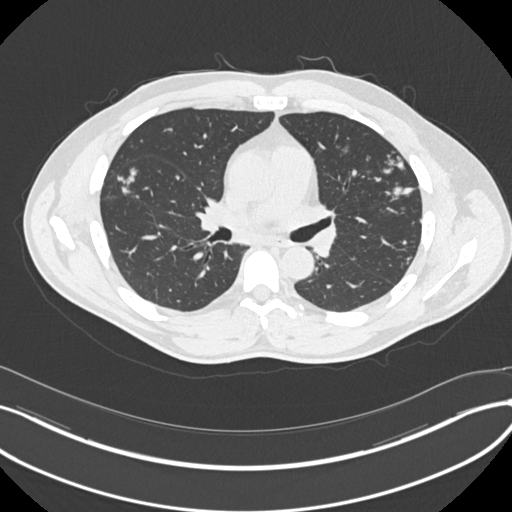
[im 234/351  lung]
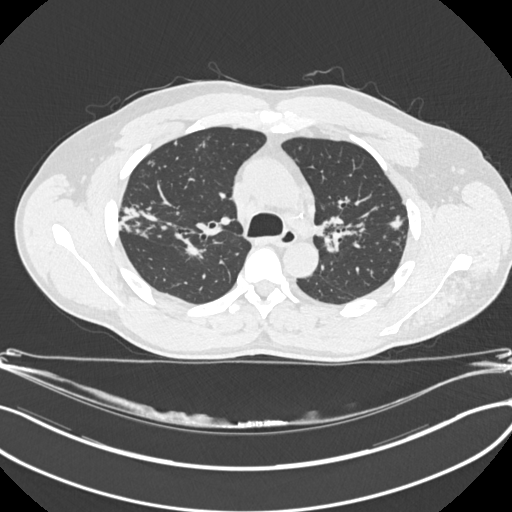
[im 257/351  lung]
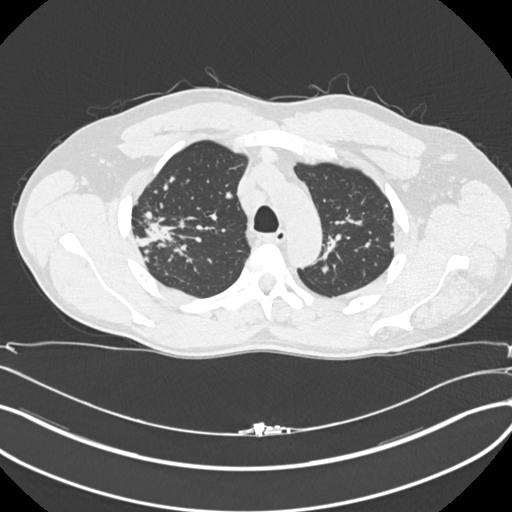
[im 281/351  mediastinal]
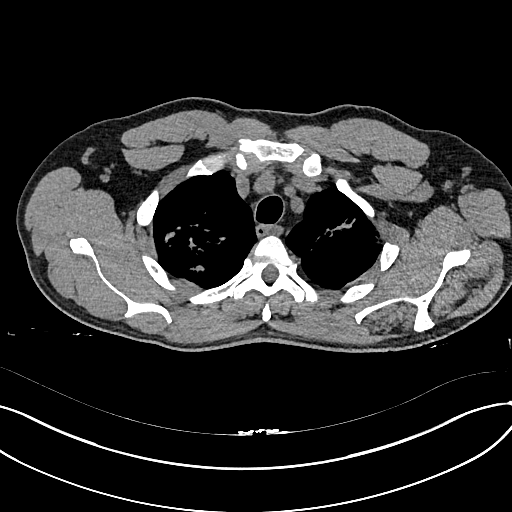
[im 281/351  lung]
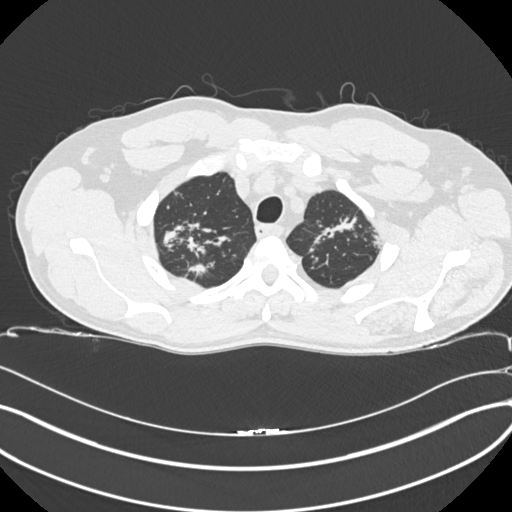
[im 327/351  lung]
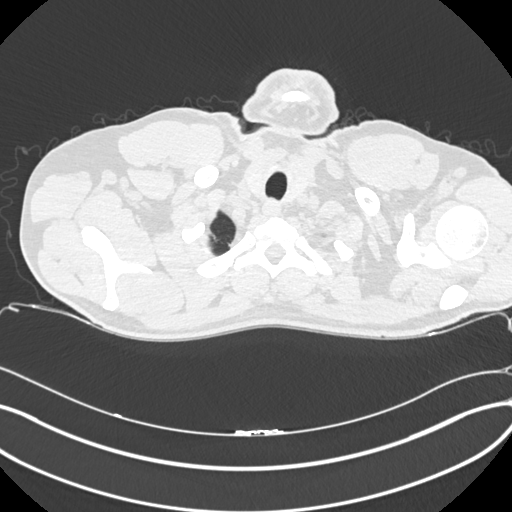

[13 of 36 positions shown; findings below may reference images not displayed]

FINDINGS: Cardiovascular: Normal caliber of the thoracic aorta. Mild aortic
atherosclerosis with calcification. Normal heart size without
pericardial effusion or focal thickening. Calcified coronary artery
disease. Central pulmonary vasculature is unremarkable. Limited
assessment of cardiovascular structures given lack of intravenous
contrast.

Mediastinum/Nodes: No axillary lymphadenopathy. Small lymph nodes at
the LEFT thoracic inlet largest approximately 10 mm short axis with
similar appearance to imaging dating back to [DATE].

RIGHT paratracheal lymph node (image 37/2) 13 as compared to 15 mm
short axis.

AP window lymph nodes when measured together in the axial plane 16 x
30 mm as compared to 19 x 32 mm when measured in a similar fashion.
LEFT paratracheal nodal tissue 8 mm (image 47/2) previously 9 mm.

Fullness of the LEFT hilum is grossly similar, not well assessed
given the absence of intravenous contrast.

Anterior mediastinal lymph node or lesion measuring 13 x 10 mm
previously 14 x 12 mm in [DATE] is unchanged when compared
to [DATE] no gross RIGHT hilar or subcarinal adenopathy.
There was fullness of subcarinal nodal tissue that is decreased when
compared to most recent comparison study from [DATE] (image
66/2) 10 mm as compared to 13 mm. Mildly patulous esophagus is
unchanged.

Lungs/Pleura: Upper lobe predominant nodularity, septal thickening
and parenchymal distortion. Focal area of parenchymal consolidation
(image 43/8) 28 x 12 mm previously 30 x 16 mm. Bronchovascular
nodularity is grossly similar in the LEFT chest when compared to the
most recent prior within the upper chest, also in the mid chest with
discrete nodules that appear stable, (image 75/8) 9 mm. Basilar
nodules are also similar to previous imaging. No new areas of
pulmonary consolidation. No signs of pleural effusions.

Upper Abdomen: Signs of nodularity of the anterior surface of the
LEFT hemiliver in this patient with hepatic hemangiomas in this
area. No acute findings in the upper abdomen. No adenopathy in the
upper abdomen.

Musculoskeletal: No chest wall mass or suspicious bone lesions
identified.
IMPRESSION: 1. Slight interval decrease in size of mediastinal lymph nodes.
2. Similar appearance of upper lobe predominant bronchovascular
nodularity, septal thickening and parenchymal distortion. Above
findings could certainly be seen in the setting of sarcoidosis.
Correlate with any prior biopsy and clinical history.
3. Fullness of the LEFT hilum is grossly similar to the most recent
prior within the upper chest, also in the mid chest with discrete
nodules that appear stable.
4. Anterior mediastinal lymph node or nodule that may be slightly
diminished since [DATE], atypical location for sarcoid
though could be related to below process. Consider attention on
follow-up or MRI of the chest on follow-up to differentiate from
lesion of thymic origin.
5. Calcified coronary artery disease.

Aortic Atherosclerosis ([ZZ]-[ZZ]).

## 2021-07-01 NOTE — Progress Notes (Signed)
Malik Jones       ,Malik Jones             [phone]       HPI: Malik Jones returns for a scheduled follow-up visit regarding his adenopathy.  Malik Jones is a 61 year old man with a history of hypertension, hyperlipidemia, diabetes, ascending aortic thrombus, stroke, and sarcoidosis.  He presented in August 2022 with a stroke.  He was noted to have a free-floating thrombus in the ascending aorta and was started on anticoagulation.  A CT angio of the chest a few days later showed no residual thrombus.  He was anticoagulated and went to rehab.  He was also noted to have mediastinal adenopathy lung nodules on his CT.  He says that he gets a hot sensation on his left side all the way up and down.  The has not had any new weakness.  No chest pain.  No cough or shortness of breath.  His anticoagulation was discontinued.   Past Medical History:  Diagnosis Date   Diabetes mellitus without complication ([place])    Hyperlipidemia    Hypertension    Hyponatremia    Medical history non-contributory    Stroke [facility])      Current Outpatient Medications  Medication Sig Dispense Refill   amLODipine (NORVASC) 10 MG tablet Take 1 tablet by mouth once daily 30 tablet 0   aspirin EC 81 MG tablet Take 1 tablet (81 mg total) by mouth daily. Swallow whole. 30 tablet 11   atorvastatin (LIPITOR) 80 MG tablet Take 1 tablet by mouth once daily 90 tablet 1   hydrochlorothiazide (MICROZIDE) 12.5 MG capsule Take 1 capsule (12.5 mg total) by mouth daily. 30 capsule 3   metFORMIN (GLUCOPHAGE) 500 MG tablet Take 1 tablet by mouth once daily with breakfast 90 tablet 1   No current facility-administered medications for this visit.    Physical Exam BP (!) 147/90   Pulse 83   Resp 20   Ht '5\' 5"'$  (1.651 m)   Wt 153 lb (69.4 kg)   SpO2 97% Comment: RA  BMI 25.46 kg/m   Diagnostic Tests: CT CHEST WITHOUT CONTRAST  TECHNIQUE: Multidetector CT imaging of the chest was  performed following the standard protocol without IV contrast.  RADIATION DOSE REDUCTION: This exam was performed according to the departmental dose-optimization program which includes automated exposure control, adjustment of the mA and/or kV according to patient size and/or use of iterative reconstruction technique.  COMPARISON: CT imaging from February 27, 2021. Also with imaging from September 16, 2020 for comparison.  FINDINGS: Cardiovascular: Normal caliber of the thoracic aorta. Mild aortic atherosclerosis with calcification. Normal heart size without pericardial effusion or focal thickening. Calcified coronary artery disease. Central pulmonary vasculature is unremarkable. Limited assessment of cardiovascular structures given lack of intravenous contrast.  Mediastinum/Nodes: No axillary lymphadenopathy. Small lymph nodes at the LEFT thoracic inlet largest approximately 10 mm short axis with similar appearance to imaging dating back to August of 2022.  RIGHT paratracheal lymph node (image 37/2) 13 as compared to 15 mm short axis.  AP window lymph nodes when measured together in the axial plane 16 x 30 mm as compared to 19 x 32 mm when measured in a similar fashion. LEFT paratracheal nodal tissue 8 mm (image 47/2) previously 9 mm.  Fullness of the LEFT hilum is grossly similar, not well assessed given the absence of intravenous contrast.  Anterior mediastinal lymph node or lesion measuring 13  x 10 mm previously 14 x 12 mm in August of 2022 is unchanged when compared to February 27, 2021 no gross RIGHT hilar or subcarinal adenopathy. There was fullness of subcarinal nodal tissue that is decreased when compared to most recent comparison study from January of 2023 (image 66/2) 10 mm as compared to 13 mm. Mildly patulous esophagus is unchanged.  Lungs/Pleura: Upper lobe predominant nodularity, septal thickening and parenchymal distortion. Focal area of parenchymal  consolidation (image 43/8) 28 x 12 mm previously 30 x 16 mm. Bronchovascular nodularity is grossly similar in the LEFT chest when compared to the most recent prior within the upper chest, also in the mid chest with discrete nodules that appear stable, (image 75/8) 9 mm. Basilar nodules are also similar to previous imaging. No new areas of pulmonary consolidation. No signs of pleural effusions.  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 No acute findings in the upper abdomen. No adenopathy in the upper abdomen.  Musculoskeletal: No chest wall mass or suspicious bone lesions identified.  IMPRESSION: 1. Slight interval decrease in size of mediastinal lymph nodes. 2. Similar appearance of upper lobe predominant bronchovascular nodularity, septal thickening and parenchymal distortion. Above findings could certainly be seen in the setting of sarcoidosis. Correlate with any prior biopsy and clinical history. 3. Fullness of the LEFT hilum is grossly similar to the most recent prior within the upper chest, also in the mid chest with discrete nodules that appear stable. 4. Anterior mediastinal lymph node or nodule that may be slightly diminished since August of 2022, atypical location for sarcoid though could be related to below process. Consider attention on follow-up or MRI of the chest on follow-up to differentiate from lesion of thymic origin. 5. Calcified coronary artery disease.  Aortic Atherosclerosis (ICD10-I70.0).   Electronically Signed By: Zetta Bills M.D. On: 07/01/2021 12:20  I personally reviewed the CT images.  Agree there is been a slight decrease in size of mediastinal nodes.  Extensive nodular lung disease and parenchymal distortion.  Impression: Malik Jones is a 61 year old man with a history of hypertension, hyperlipidemia, diabetes, ascending aortic thrombus, stroke,  and sarcoidosis.    Ascending aortic thrombus-presented with a stroke back in August.  CT of head and neck showed thrombus in the ascending aorta.  Follow-up CT angio of the chest a few days later showed no residual thrombus.  Was treated with anticoagulation initially.  That has been stopped since I last saw him.  Will need close observation.  Mediastinal adenopathy and pulmonary nodules-consistent with sarcoidosis.  Size of nodes slightly decreased.  Paresthesias-previous stroke.  Will defer to neurology.   Plan: Return in 3 months with CT angio of chest to follow-up adenopathy and rule out recurrent aortic thrombus  Melrose Nakayama, MD [facility] [phone]

## 2021-07-02 ENCOUNTER — Encounter: Payer: Self-pay | Admitting: Internal Medicine

## 2021-07-02 ENCOUNTER — Ambulatory Visit (INDEPENDENT_AMBULATORY_CARE_PROVIDER_SITE_OTHER): Payer: BC Managed Care – PPO | Admitting: Internal Medicine

## 2021-07-02 VITALS — BP 130/84 | HR 66 | Temp 98.2°F | Ht 65.0 in | Wt 155.0 lb

## 2021-07-02 DIAGNOSIS — E1169 Type 2 diabetes mellitus with other specified complication: Secondary | ICD-10-CM

## 2021-07-02 DIAGNOSIS — E785 Hyperlipidemia, unspecified: Secondary | ICD-10-CM | POA: Diagnosis not present

## 2021-07-02 DIAGNOSIS — E119 Type 2 diabetes mellitus without complications: Secondary | ICD-10-CM

## 2021-07-02 LAB — URINALYSIS, ROUTINE W REFLEX MICROSCOPIC
Bilirubin, UA: NEGATIVE
Glucose, UA: NEGATIVE
Ketones, UA: NEGATIVE
Leukocytes,UA: NEGATIVE
Nitrite, UA: NEGATIVE
Protein,UA: NEGATIVE
RBC, UA: NEGATIVE
Specific Gravity, UA: 1.025 (ref 1.005–1.030)
Urobilinogen, Ur: 0.2 mg/dL (ref 0.2–1.0)
pH, UA: 5.5 (ref 5.0–7.5)

## 2021-07-02 LAB — BAYER DCA HB A1C WAIVED: HB A1C (BAYER DCA - WAIVED): 6.2 % — ABNORMAL HIGH (ref 4.8–5.6)

## 2021-07-02 NOTE — Progress Notes (Signed)
BP 130/84   Pulse 66   Temp 98.2 F (36.8 C) (Oral)   Ht '5\' 5"'$  (1.651 m)   Wt 155 lb (70.3 kg)   SpO2 98%   BMI 25.79 kg/m    Subjective:    Patient ID: Malik Jones, male    DOB: 28-Oct-1960, 61 y.o.   MRN: [id number]  Chief Complaint  Patient presents with   Hyperlipidemia   Diabetes   Mediastinal Lymphadenopathy    HPI: Malik Jones is a 61 y.o. male  Hyperlipidemia This is a chronic problem. The current episode started more than 1 year ago. He has no history of nephrotic syndrome. Pertinent negatives include no chest pain.  Diabetes He presents for his follow-up (a1c aat 6.2) diabetic visit. He has type 2 diabetes mellitus. Pertinent negatives for diabetes include no blurred vision, no chest pain, no fatigue, no foot ulcerations, no polydipsia, no polyphagia, no visual change and no weakness.   Chief Complaint  Patient presents with   Hyperlipidemia   Diabetes   Mediastinal Lymphadenopathy    Relevant past medical, surgical, family and social history reviewed and updated as indicated. Interim medical history since our last visit reviewed. Allergies and medications reviewed and updated.  Review of Systems  Constitutional:  Negative for fatigue.  Eyes:  Negative for blurred vision.  Cardiovascular:  Negative for chest pain.  Endocrine: Negative for polydipsia and polyphagia.  Neurological:  Negative for weakness.   Per HPI unless specifically indicated above     Objective:    BP 130/84   Pulse 66   Temp 98.2 F (36.8 C) (Oral)   Ht '5\' 5"'$  (1.651 m)   Wt 155 lb (70.3 kg)   SpO2 98%   BMI 25.79 kg/m   Wt Readings from Last 3 Encounters:  07/02/21 155 lb (70.3 kg)  07/01/21 153 lb (69.4 kg)  06/12/21 151 lb (68.5 kg)    Physical Exam Vitals and nursing note reviewed.  Constitutional:      General: He is not in acute distress.    Appearance: Normal appearance. He is not ill-appearing or diaphoretic.  HENT:     Head: Normocephalic and  atraumatic.     Right Ear: Tympanic membrane and external ear normal. There is no impacted cerumen.     Left Ear: External ear normal.     Nose: No congestion or rhinorrhea.     Mouth/Throat:     Pharynx: No oropharyngeal exudate or posterior oropharyngeal erythema.  Eyes:     Conjunctiva/sclera: Conjunctivae normal.     Pupils: Pupils are equal, round, and reactive to light.  Cardiovascular:     Rate and Rhythm: Normal rate and regular rhythm.     Heart sounds: No murmur heard.   No friction rub. No gallop.  Pulmonary:     Effort: No respiratory distress.     Breath sounds: No stridor. No wheezing or rhonchi.  Chest:     Chest wall: No tenderness.  Abdominal:     General: Abdomen is flat. Bowel sounds are normal.     Palpations: Abdomen is soft. There is no mass.     Tenderness: There is no abdominal tenderness.  Musculoskeletal:     Cervical back: Normal range of motion and neck supple. No rigidity or tenderness.     Left lower leg: No edema.  Skin:    General: Skin is warm and dry.  Neurological:     Mental Status: He is alert.    Results for  orders placed or performed in visit on 07/02/21  Bayer DCA Hb A1c Waived  Result Value Ref Range   HB A1C (BAYER DCA - WAIVED) 6.2 (H) 4.8 - 5.6 %  Urinalysis, Routine w reflex microscopic  Result Value Ref Range   Specific Gravity, UA 1.025 1.005 - 1.030   pH, UA 5.5 5.0 - 7.5   Color, UA Yellow Yellow   Appearance Ur Clear Clear   Leukocytes,UA Negative Negative   Protein,UA Negative Negative/Trace   Glucose, UA Negative Negative   Ketones, UA Negative Negative   RBC, UA Negative Negative   Bilirubin, UA Negative Negative   Urobilinogen, Ur 0.2 0.2 - 1.0 mg/dL   Nitrite, UA Negative Negative        Current Outpatient Medications:    amLODipine (NORVASC) 10 MG tablet, Take 1 tablet by mouth once daily, Disp: 30 tablet, Rfl: 0   aspirin EC 81 MG tablet, Take 1 tablet (81 mg total) by mouth daily. 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  increase changes in temperature on the left side he feels hot on this side. To d/w neurology regarding this ? Needs neurontin  HLD is on lipitor 80 mg recheck FLP, check LFT's work on diet, SE of meds explained to pt. low fat and high fiber diet explained to pt.   HTN -  Continue current meds.  Medication compliance emphasised. pt advised to keep Bp logs. Pt verbalised understanding of the same. Pt to have a low salt diet . Exercise to reach a goal of at least 150 mins a week.  lifestyle modifications explained and pt understands importance of the above. Under good control on current regimen. Continue current regimen. Continue to monitor. Call with any concerns. Refills given. Labs drawn today.  DM - is on metformin for such a1c at 6.3 check HbA1c,  urine  microalbumin  diabetic diet plan given to pt  adviced regarding hypoglycemia and instructions given to pt today on how to prevent and treat the same if it were to occur. pt acknowledges the plan and voices understanding of the same.  exercise plan given and encouraged.   advice diabetic yearly podiatry, ophthalmology , nutritionist , dental check q 6 months,   Mediastinal lymphadenopathy sees Dr. Koleen Nimrod CT surgery for aortic thrombus  And Dr. Patsey Berthold  Slight interval decrease in size of mediastinal lymph nodes. 2. Similar appearance of upper lobe predominant bronchovascular nodularity, septal thickening and parenchymal distortion. Above findings could certainly be seen in the setting  of sarcoidosis. Correlate with any prior biopsy and clinical history. 3. Fullness of the LEFT hilum is grossly similar to the most recent prior within the upper chest, also in the mid chest with discrete nodules that appear stable. 4. Anterior mediastinal lymph node or nodule that may be slightly diminished since August of 2022, atypical location for sarcoid though could be related to below process. Consider attention on follow-up or MRI of the chest on follow-up to differentiate from lesion of thymic origin. 5. Calcified coronary artery  disease.  Aortic Atherosclerosis (ICD10-I70.0). Problem List Items Addressed This Visit       Endocrine   Hyperlipidemia associated with type 2 diabetes mellitus ([place]) - Primary   Relevant Orders   Bayer DCA Hb A1c Waived   Lipid panel   Basic metabolic panel   Vitamin K55   Urinalysis, Routine w reflex microscopic   Microalbumin, urine   Bayer DCA Hb A1c Waived (Completed)   Lipid panel   Basic metabolic panel   Vitamin V74   Urinalysis, Routine w reflex microscopic (Completed)   Diabetes mellitus without complication (HCC)   Relevant Orders   Bayer DCA Hb A1c Waived   Lipid panel   Basic metabolic panel   Vitamin M27   Urinalysis, Routine w reflex microscopic   Microalbumin, urine   Bayer DCA Hb A1c Waived (Completed)   Lipid panel   Basic metabolic panel   Vitamin M78   Urinalysis, Routine w reflex microscopic (Completed)     Orders Placed This Encounter  Procedures   Bayer DCA Hb A1c Waived   Lipid panel   Basic metabolic panel   Vitamin M75   Urinalysis, Routine w reflex microscopic   Microalbumin, urine   Bayer DCA Hb A1c Waived   Lipid panel   Basic metabolic panel   Vitamin Q49   Urinalysis, Routine w reflex microscopic     No orders of the defined types were placed in this encounter.    Follow up plan: Return in about 3 months (around 10/02/2021).

## 2021-07-03 LAB — BASIC METABOLIC PANEL
BUN/Creatinine Ratio: 14 (ref 9–20)
BUN: 13 mg/dL (ref 6–24)
CO2: 22 mmol/L (ref 20–29)
Calcium: 9.7 mg/dL (ref 8.7–10.2)
Chloride: 101 mmol/L (ref 96–106)
Creatinine, Ser: 0.94 mg/dL (ref 0.76–1.27)
Glucose: 111 mg/dL — ABNORMAL HIGH (ref 70–99)
Potassium: 4 mmol/L (ref 3.5–5.2)
Sodium: 140 mmol/L (ref 134–144)
eGFR: 94 mL/min/{1.73_m2} (ref >59)

## 2021-07-03 LAB — VITAMIN B12: Vitamin B-12: 329 pg/mL (ref 232–1245)

## 2021-07-03 LAB — LIPID PANEL
Chol/HDL Ratio: 2.6 ratio (ref 0.0–5.0)
Cholesterol, Total: 112 mg/dL (ref 100–199)
HDL: 43 mg/dL (ref >39)
LDL Chol Calc (NIH): 52 mg/dL (ref 0–99)
Triglycerides: 89 mg/dL (ref 0–149)
VLDL Cholesterol Cal: 17 mg/dL (ref 5–40)

## 2021-07-22 ENCOUNTER — Other Ambulatory Visit: Payer: Self-pay | Admitting: Physical Medicine and Rehabilitation

## 2021-07-22 ENCOUNTER — Other Ambulatory Visit: Payer: Self-pay | Admitting: Internal Medicine

## 2021-07-23 NOTE — Telephone Encounter (Signed)
Requested Prescriptions  Pending Prescriptions Disp Refills  . atorvastatin (LIPITOR) 80 MG tablet [Pharmacy Med Name: Atorvastatin Calcium 80 MG Oral Tablet] 90 tablet 0    Sig: Take 1 tablet by mouth once daily     Cardiovascular:  Antilipid - Statins Failed - 07/22/2021 12:26 PM      Failed - Lipid Panel in normal range within the last 12 months    Cholesterol, Total  Date Value Ref Range Status  07/02/2021 112 100 - 199 mg/dL Final   LDL Chol Calc (NIH)  Date Value Ref Range Status  07/02/2021 52 0 - 99 mg/dL Final   HDL  Date Value Ref Range Status  07/02/2021 43 >39 mg/dL Final   Triglycerides  Date Value Ref Range Status  07/02/2021 89 0 - 149 mg/dL Final         Passed - Patient is not pregnant      Passed - Valid encounter within last 12 months    Recent Outpatient Visits          3 weeks ago Hyperlipidemia associated with type 2 diabetes mellitus ([place])   [facility] Vigg, Avanti, MD   3 months ago Mediastinal lymphadenopathy   [facility] Vigg, Avanti, MD   5 months ago Screen for colon cancer   [facility] Vigg, Avanti, MD   6 months ago Essential hypertension   [place] Vigg, Avanti, MD   7 months ago Difficulty in walking, not elsewhere classified   [place], Avanti, MD      Future Appointments            In 2 months Kathrine Haddock, NP [place]           . hydrochlorothiazide (MICROZIDE) 12.5 MG capsule [Pharmacy Med Name: hydroCHLOROthiazide 12.5 MG Oral Capsule] 30 capsule 0    Sig: Take 1 capsule by mouth once daily     Cardiovascular: Diuretics - Thiazide Passed - 07/22/2021 12:26 PM      Passed - Cr in normal range and within 180 days    Creatinine, Ser  Date Value Ref Range Status  07/02/2021 0.94 0.76 - 1.27 mg/dL Final         Passed - K in normal range and within 180 days    Potassium  Date Value Ref Range Status  07/02/2021 4.0 3.5 -  5.2 mmol/L Final         Passed - Na in normal range and within 180 days    Sodium  Date Value Ref Range Status  07/02/2021 140 134 - 144 mmol/L Final         Passed - Last BP in normal range    BP Readings from Last 1 Encounters:  07/02/21 130/84         Passed - Valid encounter within last 6 months    Recent Outpatient Visits          3 weeks ago Hyperlipidemia associated with type 2 diabetes mellitus ([place])   [facility] Vigg, Avanti, MD   3 months ago Mediastinal lymphadenopathy   [place], Avanti, MD   5 months ago Screen for colon cancer   [facility] Vigg, Avanti, MD   6 months ago Essential hypertension   [place] Vigg, Avanti, MD   7 months ago Difficulty in walking, not elsewhere classified   [facility] Vigg, Avanti, MD  Future Appointments            In 2 months Kathrine Haddock, NP [facility], [facility]           . metFORMIN (GLUCOPHAGE) 500 MG tablet [Pharmacy Med Name: metFORMIN HCl 500 MG Oral Tablet] 90 tablet 0    Sig: Take 1 tablet by mouth once daily with breakfast     Endocrinology:  Diabetes - Biguanides Passed - 07/22/2021 12:26 PM      Passed - Cr in normal range and within 360 days    Creatinine, Ser  Date Value Ref Range Status  07/02/2021 0.94 0.76 - 1.27 mg/dL Final         Passed - HBA1C is between 0 and 7.9 and within 180 days    HB A1C (BAYER DCA - WAIVED)  Date Value Ref Range Status  07/02/2021 6.2 (H) 4.8 - 5.6 % Final    Comment:             Prediabetes: 5.7 - 6.4          Diabetes: >6.4          Glycemic control for adults with diabetes: <7.0          Passed - eGFR in normal range and within 360 days    GFR, Estimated  Date Value Ref Range Status  09/25/2020 >60 >60 mL/min Final    Comment:    (NOTE) Calculated using the CKD-EPI Creatinine Equation (2021)    eGFR  Date Value Ref Range Status  07/02/2021 94 >59 mL/min/1.73 Final          Passed - B12 Level in normal range and within 720 days    Vitamin B-12  Date Value Ref Range Status  07/02/2021 329 232 - 1,245 pg/mL Final         Passed - Valid encounter within last 6 months    Recent Outpatient Visits          3 weeks ago Hyperlipidemia associated with type 2 diabetes mellitus ([place])   [facility] Vigg, Avanti, MD   3 months ago Mediastinal lymphadenopathy   [facility] Vigg, Avanti, MD   5 months ago Screen for colon cancer   [facility] Vigg, Avanti, MD   6 months ago Essential hypertension   [place] Vigg, Avanti, MD   7 months ago Difficulty in walking, not elsewhere classified   [place], Avanti, MD      Future Appointments            In 2 months Kathrine Haddock, NP [facility], [facility]           Passed - CBC within normal limits and completed in the last 12 months    WBC  Date Value Ref Range Status  11/27/2020 6.8 3.4 - 10.8 x10E3/uL Final  09/19/2020 5.1 4.0 - 10.5 K/uL Final   RBC  Date Value Ref Range Status  11/27/2020 4.27 4.14 - 5.80 x10E6/uL Final  09/19/2020 5.06 4.22 - 5.81 MIL/uL Final   Hemoglobin  Date Value Ref Range Status  11/27/2020 13.3 13.0 - 17.7 g/dL Final   Hematocrit  Date Value Ref Range Status  11/27/2020 39.7 37.5 - 51.0 % Final   MCHC  Date Value Ref Range Status  11/27/2020 33.5 31.5 - 35.7 g/dL Final  09/19/2020 33.5 30.0 - 36.0 g/dL Final   [facility]  Date Value Ref Range Status  11/27/2020 31.1 26.6 -  33.0 pg Final  09/19/2020 31.4 26.0 - 34.0 pg Final   MCV  Date Value Ref Range Status  11/27/2020 93 79 - 97 fL Final   No results found for: "PLTCOUNTKUC", "LABPLAT", "POCPLA" RDW  Date Value Ref Range Status  11/27/2020 12.9 11.6 - 15.4 % Final

## 2021-08-08 ENCOUNTER — Other Ambulatory Visit: Payer: Self-pay | Admitting: Thoracic Surgery (Cardiothoracic Vascular Surgery)

## 2021-08-08 DIAGNOSIS — I7410 Embolism and thrombosis of unspecified parts of aorta: Secondary | ICD-10-CM

## 2021-08-25 NOTE — Telephone Encounter (Signed)
  Medication Refill - Medication:   atorvastatin (LIPITOR)    Has the patient contacted their pharmacy? Yes.   (Agent: If no, request that the patient contact the pharmacy for the refill. If patient does not wish to contact the pharmacy document the reason why and proceed with request.) (Agent: If yes, when and what did the pharmacy advise?) call dr, has submitted already  Preferred Pharmacy (with phone number or street name):  [facility] [place] - [address]  [place] ([place]) [place] [address]  Phone: [phone] Fax: [phone]  Hours: Not open 24 hours   Has the patient been seen for an appointment in the last year OR does the patient have an upcoming appointment? Yes.   07/02/21  Agent: Please be advised that RX refills may take up to 3 business days. We ask that you follow-up with your pharmacy.  Pt is extremely adamant that Dr Neomia Dear has always refilled this he had appt in may and upcoming in August. There is a note on fill that this last refill message stated PCP needs to refill? FU with pt as very anxious and thinks not being refill due to Dr Neomia Dear leaving.

## 2021-08-26 ENCOUNTER — Other Ambulatory Visit: Payer: Self-pay

## 2021-08-26 MED ORDER — HYDROCHLOROTHIAZIDE 12.5 MG PO CAPS: 12.5000 mg | ORAL_CAPSULE | Freq: Every day | ORAL | 0 refills | Status: DC

## 2021-09-08 MED ORDER — AMLODIPINE BESYLATE 10 MG PO TABS: 10.0000 mg | ORAL_TABLET | Freq: Every day | ORAL | 0 refills | Status: DC

## 2021-09-08 NOTE — Telephone Encounter (Signed)
Requested medication (s) are due for refill today - yes  Requested medication (s) are on the active medication list -yes  Future visit scheduled -yes  Last refill: 07/22/21 #30  Notes to clinic: Former patient- Dr Neomia Dear- has appointment scheduled   Requested Prescriptions  Pending Prescriptions Disp Refills   amLODipine (NORVASC) 10 MG tablet 30 tablet 0    Sig: Take 1 tablet (10 mg total) by mouth daily.     Cardiovascular: Calcium Channel Blockers 2 Passed - 09/08/2021  4:05 PM      Passed - Last BP in normal range    BP Readings from Last 1 Encounters:  07/02/21 130/84         Passed - Last Heart Rate in normal range    Pulse Readings from Last 1 Encounters:  07/02/21 66         Passed - Valid encounter within last 6 months    Recent Outpatient Visits           2 months ago Hyperlipidemia associated with type 2 diabetes mellitus ([place])   [facility] Vigg, Avanti, MD   5 months ago Mediastinal lymphadenopathy   [facility] Vigg, Avanti, MD   7 months ago Screen for colon cancer   [place] Vigg, Avanti, MD   8 months ago Essential hypertension   [place] Vigg, Avanti, MD   8 months ago Difficulty in walking, not elsewhere classified   [place], [place]       Future Appointments             In 4 weeks Kathrine Haddock, NP [place]            Signed Prescriptions Disp Refills   atorvastatin (LIPITOR) 80 MG tablet 90 tablet 0    Sig: Take 1 tablet by mouth once daily     Cardiovascular:  Antilipid - Statins Failed - 07/22/2021 12:26 PM      Failed - Lipid Panel in normal range within the last 12 months    Cholesterol, Total  Date Value Ref Range Status  07/02/2021 112 100 - 199 mg/dL Final   LDL Chol Calc (NIH)  Date Value Ref Range Status  07/02/2021 52 0 - 99 mg/dL Final   HDL  Date Value Ref Range Status  07/02/2021 43 >39 mg/dL Final    Triglycerides  Date Value Ref Range Status  07/02/2021 89 0 - 149 mg/dL Final         Passed - Patient is not pregnant      Passed - Valid encounter within last 12 months    Recent Outpatient Visits           2 months ago Hyperlipidemia associated with type 2 diabetes mellitus ([place])   [facility] Vigg, Avanti, MD   5 months ago Mediastinal lymphadenopathy   [facility] Vigg, Avanti, MD   7 months ago Screen for colon cancer   [facility] Vigg, Avanti, MD   8 months ago Essential hypertension   [place] Vigg, Avanti, MD   8 months ago Difficulty in walking, not elsewhere classified   [place], Avanti, MD       Future Appointments             In 4 weeks Kathrine Haddock, NP [facility], [facility]             hydrochlorothiazide (MICROZIDE) 12.5  MG capsule 30 capsule 0    Sig: Take 1 capsule by mouth once daily     Cardiovascular: Diuretics - Thiazide Passed - 07/22/2021 12:26 PM      Passed - Cr in normal range and within 180 days    Creatinine, Ser  Date Value Ref Range Status  07/02/2021 0.94 0.76 - 1.27 mg/dL Final         Passed - K in normal range and within 180 days    Potassium  Date Value Ref Range Status  07/02/2021 4.0 3.5 - 5.2 mmol/L Final         Passed - Na in normal range and within 180 days    Sodium  Date Value Ref Range Status  07/02/2021 140 134 - 144 mmol/L Final         Passed - Last BP in normal range    BP Readings from Last 1 Encounters:  07/02/21 130/84         Passed - Valid encounter within last 6 months    Recent Outpatient Visits           2 months ago Hyperlipidemia associated with type 2 diabetes mellitus ([place])   [facility] Vigg, Avanti, MD   5 months ago Mediastinal lymphadenopathy   [place] Vigg, Avanti, MD   7 months ago Screen for colon cancer   [facility] Vigg, Avanti, MD   8 months  ago Essential hypertension   [place] Vigg, Avanti, MD   8 months ago Difficulty in walking, not elsewhere classified   [place], Avanti, MD       Future Appointments             In 4 weeks Kathrine Haddock, NP [facility], [facility]             metFORMIN (GLUCOPHAGE) 500 MG tablet 90 tablet 0    Sig: Take 1 tablet by mouth once daily with breakfast     Endocrinology:  Diabetes - Biguanides Passed - 07/22/2021 12:26 PM      Passed - Cr in normal range and within 360 days    Creatinine, Ser  Date Value Ref Range Status  07/02/2021 0.94 0.76 - 1.27 mg/dL Final         Passed - HBA1C is between 0 and 7.9 and within 180 days    HB A1C (BAYER DCA - WAIVED)  Date Value Ref Range Status  07/02/2021 6.2 (H) 4.8 - 5.6 % Final    Comment:             Prediabetes: 5.7 - 6.4          Diabetes: >6.4          Glycemic control for adults with diabetes: <7.0          Passed - eGFR in normal range and within 360 days    GFR, Estimated  Date Value Ref Range Status  09/25/2020 >60 >60 mL/min Final    Comment:    (NOTE) Calculated using the CKD-EPI Creatinine Equation (2021)    eGFR  Date Value Ref Range Status  07/02/2021 94 >59 mL/min/1.73 Final         Passed - B12 Level in normal range and within 720 days    Vitamin B-12  Date Value Ref Range Status  07/02/2021 329 232 - 1,245 pg/mL Final         Passed - Valid encounter within  last 6 months    Recent Outpatient Visits           2 months ago Hyperlipidemia associated with type 2 diabetes mellitus ([place])   [facility] Vigg, Avanti, MD   5 months ago Mediastinal lymphadenopathy   [facility] Vigg, Avanti, MD   7 months ago Screen for colon cancer   [place] Vigg, Avanti, MD   8 months ago Essential hypertension   [place] Vigg, Avanti, MD   8 months ago Difficulty in walking, not elsewhere classified   [place], MD       Future Appointments             In 4 weeks Kathrine Haddock, NP [place], [facility]            Passed - CBC within normal limits and completed in the last 12 months    WBC  Date Value Ref Range Status  11/27/2020 6.8 3.4 - 10.8 x10E3/uL Final  09/19/2020 5.1 4.0 - 10.5 K/uL Final   RBC  Date Value Ref Range Status  11/27/2020 4.27 4.14 - 5.80 x10E6/uL Final  09/19/2020 5.06 4.22 - 5.81 MIL/uL Final   Hemoglobin  Date Value Ref Range Status  11/27/2020 13.3 13.0 - 17.7 g/dL Final   Hematocrit  Date Value Ref Range Status  11/27/2020 39.7 37.5 - 51.0 % Final   MCHC  Date Value Ref Range Status  11/27/2020 33.5 31.5 - 35.7 g/dL Final  09/19/2020 33.5 30.0 - 36.0 g/dL Final   [facility]  Date Value Ref Range Status  11/27/2020 31.1 26.6 - 33.0 pg Final  09/19/2020 31.4 26.0 - 34.0 pg Final   MCV  Date Value Ref Range Status  11/27/2020 93 79 - 97 fL Final   No results found for: "PLTCOUNTKUC", "LABPLAT", "POCPLA" RDW  Date Value Ref Range Status  11/27/2020 12.9 11.6 - 15.4 % Final            Requested Prescriptions  Pending Prescriptions Disp Refills   amLODipine (NORVASC) 10 MG tablet 30 tablet 0    Sig: Take 1 tablet (10 mg total) by mouth daily.     Cardiovascular: Calcium Channel Blockers 2 Passed - 09/08/2021  4:05 PM      Passed - Last BP in normal range    BP Readings from Last 1 Encounters:  07/02/21 130/84         Passed - Last Heart Rate in normal range    Pulse Readings from Last 1 Encounters:  07/02/21 66         Passed - Valid encounter within last 6 months    Recent Outpatient Visits           2 months ago Hyperlipidemia associated with type 2 diabetes mellitus ([place])   [facility] Vigg, Avanti, MD   5 months ago Mediastinal lymphadenopathy   [facility] Vigg, Avanti, MD   7 months ago Screen for colon cancer   [place] Vigg, Avanti, MD   8  months ago Essential hypertension   [place] Vigg, Avanti, MD   8 months ago Difficulty in walking, not elsewhere classified   [place], MD       Future Appointments             In 4 weeks Kathrine Haddock, NP [facility], [place]  Signed Prescriptions Disp Refills   atorvastatin (LIPITOR) 80 MG tablet 90 tablet 0    Sig: Take 1 tablet by mouth once daily     Cardiovascular:  Antilipid - Statins Failed - 07/22/2021 12:26 PM      Failed - Lipid Panel in normal range within the last 12 months    Cholesterol, Total  Date Value Ref Range Status  07/02/2021 112 100 - 199 mg/dL Final   LDL Chol Calc (NIH)  Date Value Ref Range Status  07/02/2021 52 0 - 99 mg/dL Final   HDL  Date Value Ref Range Status  07/02/2021 43 >39 mg/dL Final   Triglycerides  Date Value Ref Range Status  07/02/2021 89 0 - 149 mg/dL Final         Passed - Patient is not pregnant      Passed - Valid encounter within last 12 months    Recent Outpatient Visits           2 months ago Hyperlipidemia associated with type 2 diabetes mellitus ([place])   [facility] Vigg, Avanti, MD   5 months ago Mediastinal lymphadenopathy   [facility] Vigg, Avanti, MD   7 months ago Screen for colon cancer   [facility] Vigg, Avanti, MD   8 months ago Essential hypertension   [place] Vigg, Avanti, MD   8 months ago Difficulty in walking, not elsewhere classified   [place], Avanti, MD       Future Appointments             In 4 weeks Kathrine Haddock, NP [place], [facility]             hydrochlorothiazide (MICROZIDE) 12.5 MG capsule 30 capsule 0    Sig: Take 1 capsule by mouth once daily     Cardiovascular: Diuretics - Thiazide Passed - 07/22/2021 12:26 PM      Passed - Cr in normal range and within 180 days    Creatinine, Ser  Date Value Ref Range Status   07/02/2021 0.94 0.76 - 1.27 mg/dL Final         Passed - K in normal range and within 180 days    Potassium  Date Value Ref Range Status  07/02/2021 4.0 3.5 - 5.2 mmol/L Final         Passed - Na in normal range and within 180 days    Sodium  Date Value Ref Range Status  07/02/2021 140 134 - 144 mmol/L Final         Passed - Last BP in normal range    BP Readings from Last 1 Encounters:  07/02/21 130/84         Passed - Valid encounter within last 6 months    Recent Outpatient Visits           2 months ago Hyperlipidemia associated with type 2 diabetes mellitus ([place])   [facility] Vigg, Avanti, MD   5 months ago Mediastinal lymphadenopathy   [place], Avanti, MD   7 months ago Screen for colon cancer   [facility] Vigg, Avanti, MD   8 months ago Essential hypertension   [place] Vigg, Avanti, MD   8 months ago Difficulty in walking, not elsewhere classified   [place], Avanti, MD       Future Appointments  In 4 weeks Kathrine Haddock, NP [place], [facility]             metFORMIN (GLUCOPHAGE) 500 MG tablet 90 tablet 0    Sig: Take 1 tablet by mouth once daily with breakfast     Endocrinology:  Diabetes - Biguanides Passed - 07/22/2021 12:26 PM      Passed - Cr in normal range and within 360 days    Creatinine, Ser  Date Value Ref Range Status  07/02/2021 0.94 0.76 - 1.27 mg/dL Final         Passed - HBA1C is between 0 and 7.9 and within 180 days    HB A1C (BAYER DCA - WAIVED)  Date Value Ref Range Status  07/02/2021 6.2 (H) 4.8 - 5.6 % Final    Comment:             Prediabetes: 5.7 - 6.4          Diabetes: >6.4          Glycemic control for adults with diabetes: <7.0          Passed - eGFR in normal range and within 360 days    GFR, Estimated  Date Value Ref Range Status  09/25/2020 >60 >60 mL/min Final    Comment:    (NOTE) Calculated using  the CKD-EPI Creatinine Equation (2021)    eGFR  Date Value Ref Range Status  07/02/2021 94 >59 mL/min/1.73 Final         Passed - B12 Level in normal range and within 720 days    Vitamin B-12  Date Value Ref Range Status  07/02/2021 329 232 - 1,245 pg/mL Final         Passed - Valid encounter within last 6 months    Recent Outpatient Visits           2 months ago Hyperlipidemia associated with type 2 diabetes mellitus ([place])   [facility] Vigg, Avanti, MD   5 months ago Mediastinal lymphadenopathy   [facility] Vigg, Avanti, MD   7 months ago Screen for colon cancer   [facility] Vigg, Avanti, MD   8 months ago Essential hypertension   [place] Vigg, Avanti, MD   8 months ago Difficulty in walking, not elsewhere classified   [place], Avanti, MD       Future Appointments             In 4 weeks Kathrine Haddock, NP [facility], [facility]            Passed - CBC within normal limits and completed in the last 12 months    WBC  Date Value Ref Range Status  11/27/2020 6.8 3.4 - 10.8 x10E3/uL Final  09/19/2020 5.1 4.0 - 10.5 K/uL Final   RBC  Date Value Ref Range Status  11/27/2020 4.27 4.14 - 5.80 x10E6/uL Final  09/19/2020 5.06 4.22 - 5.81 MIL/uL Final   Hemoglobin  Date Value Ref Range Status  11/27/2020 13.3 13.0 - 17.7 g/dL Final   Hematocrit  Date Value Ref Range Status  11/27/2020 39.7 37.5 - 51.0 % Final   MCHC  Date Value Ref Range Status  11/27/2020 33.5 31.5 - 35.7 g/dL Final  09/19/2020 33.5 30.0 - 36.0 g/dL Final   [facility]  Date Value Ref Range Status  11/27/2020 31.1 26.6 - 33.0 pg Final  09/19/2020 31.4 26.0 - 34.0 pg Final   MCV  Date Value  Ref Range Status  11/27/2020 93 79 - 97 fL Final   No results found for: "PLTCOUNTKUC", "LABPLAT", "POCPLA" RDW  Date Value Ref Range Status  11/27/2020 12.9 11.6 - 15.4 % Final

## 2021-09-08 NOTE — Telephone Encounter (Signed)
Call to patient- he should be good with Atorvastatin- filled 07/23/21 #90. Patient states he is good with that - he needs Amlodipine.

## 2021-09-08 NOTE — Addendum Note (Signed)
Addended by: Valli Glance F on: 09/08/2021 04:05 PM   Modules accepted: Orders

## 2021-09-08 NOTE — Telephone Encounter (Signed)
Pt called in to follow up on his refill request for Atorvastatin. Pt says that he is out of his medication.    Pharmacy:  [place] [facility] [place] - [address] Phone:  [phone]  Fax:  [phone]

## 2021-09-26 ENCOUNTER — Other Ambulatory Visit: Payer: Self-pay | Admitting: Nurse Practitioner

## 2021-09-26 MED ORDER — METFORMIN HCL 500 MG PO TABS
500.0000 mg | ORAL_TABLET | Freq: Every day | ORAL | 0 refills | Status: DC
Start: 2021-09-26 — End: 2021-10-07

## 2021-09-26 NOTE — Telephone Encounter (Signed)
LOV 07/02/21  Future appt 10/07/21   Last A1C 07/02/21  "6.2"

## 2021-10-07 ENCOUNTER — Ambulatory Visit (INDEPENDENT_AMBULATORY_CARE_PROVIDER_SITE_OTHER): Payer: Self-pay | Admitting: Physician Assistant

## 2021-10-07 ENCOUNTER — Encounter: Payer: Self-pay | Admitting: Physician Assistant

## 2021-10-07 VITALS — BP 146/99 | HR 76 | Temp 98.2°F | Ht 65.0 in | Wt 141.3 lb

## 2021-10-07 DIAGNOSIS — I10 Essential (primary) hypertension: Secondary | ICD-10-CM

## 2021-10-07 DIAGNOSIS — E119 Type 2 diabetes mellitus without complications: Secondary | ICD-10-CM

## 2021-10-07 DIAGNOSIS — E1169 Type 2 diabetes mellitus with other specified complication: Secondary | ICD-10-CM

## 2021-10-07 DIAGNOSIS — E785 Hyperlipidemia, unspecified: Secondary | ICD-10-CM

## 2021-10-07 LAB — BAYER DCA HB A1C WAIVED: HB A1C (BAYER DCA - WAIVED): 6.2 % — ABNORMAL HIGH (ref 4.8–5.6)

## 2021-10-07 MED ORDER — ATORVASTATIN CALCIUM 80 MG PO TABS: 80.0000 mg | ORAL_TABLET | Freq: Every day | ORAL | 1 refills | Status: DC

## 2021-10-07 MED ORDER — METFORMIN HCL 500 MG PO TABS: 500.0000 mg | ORAL_TABLET | Freq: Every day | ORAL | 1 refills | Status: DC

## 2021-10-07 MED ORDER — HYDROCHLOROTHIAZIDE 12.5 MG PO CAPS: 12.5000 mg | ORAL_CAPSULE | Freq: Every day | ORAL | 1 refills | Status: DC

## 2021-10-07 MED ORDER — AMLODIPINE BESYLATE 10 MG PO TABS: 10.0000 mg | ORAL_TABLET | Freq: Every day | ORAL | 1 refills | Status: DC

## 2021-10-07 NOTE — Assessment & Plan Note (Signed)
Chronic, ongoing condition  A1c was 6.2 today  Patient is currently taking Metformin 500 mg PO QAM and appears to be tolerating well Foot exam completed today Discussed importance of regular eye exams to monitor for retinopathy Recommend regular exercise and diabetes-conscious diet Follow up in 6 months for monitoring as A1c is well within goal at this time.

## 2021-10-07 NOTE — Assessment & Plan Note (Signed)
Chronic, historic condition Reviewed previous Lipid panel results from May 2023 -cholesterol in goal with Atorvastatin 80 mg PO QD Continue current medications and diet  Recommend follow up in 6 months for monitoring

## 2021-10-07 NOTE — Progress Notes (Signed)
Established Patient Office Visit  Name: Malik Jones   MRN: [id number]    DOB: 1960/08/04   Date:10/07/2021  Today's Provider: Talitha Givens, MHS, PA-C Introduced myself to the patient as a PA-C and provided education on APPs in clinical practice.         Subjective  Chief Complaint  Chief Complaint  Patient presents with   Hypertension   Diabetes   Cerebellar Stroke    F/U    HPI   HYPERTENSION / HYPERLIPIDEMIA Satisfied with current treatment? yes Duration of hypertension: chronic BP monitoring frequency: a few times a week BP range: 127/83,  BP medication side effects: no Past BP meds: amlodipine and HCTZ Duration of hyperlipidemia: chronic Cholesterol medication side effects: no Cholesterol supplements: none Past cholesterol medications: atorvastain (lipitor) Medication compliance: excellent compliance Aspirin: yes Recent stressors: no Recurrent headaches: no Visual changes: no Palpitations: no Dyspnea: no Chest pain: no Lower extremity edema: no Dizzy/lightheaded: no   Diabetes, Type 2 - Last A1c 6.2 on 07/02/21 - Medications: Metformin 500 mg PO QAM  - Compliance: excellent  - Checking BG at home: Checking once per week- usually run around 120s  - Diet: Tries to eat a lot of fruit and vegetables, tries to eat more chicken and fish, reduced red meat intake,  - Exercise: Tries to stay active around the house  - Eye exam: Reviewed importance of yearly diabetic eye exam to check for retinopathy and having results sent here to update records  - Foot exam: Sees neurology who checks his feet for monofilament  - Microalbumin: last ordered on 07/02/21 - Statin: On statin therapy  - PNA vaccine: not due yet  - Denies symptoms of hypoglycemia, polyuria, polydipsia, numbness extremities, foot ulcers/trauma   CVA  Pt reports some odd sensations on the left side of his body- some weakness and reduced sensation He is followed by Neurology and has regular  follow ups with them    Patient Active Problem List   Diagnosis Date Noted   Mediastinal lymphadenopathy 04/04/2021   Hepatic hemangioma 04/04/2021   Diabetes mellitus without complication ([place]) 60/73/7106   Screen for colon cancer 01/27/2021   Need for influenza vaccination 01/27/2021   Hyperlipidemia associated with type 2 diabetes mellitus ([place]) 12/23/2020   Muscle weakness (generalized) 12/05/2020   Abnormality of gait and mobility 12/05/2020   Unsteadiness on feet 12/05/2020   Other abnormalities of gait and mobility 12/05/2020   Difficulty in walking, not elsewhere classified 12/05/2020   Other lack of coordination 12/05/2020   Type 2 diabetes mellitus with neurological complications ([place]) 26/94/8546   Mixed hyperlipidemia 11/27/2020   Vitamin D deficiency 10/04/2020   Mediastinal adenopathy 10/04/2020   Hyponatremia    Transaminitis    Malnutrition of moderate degree 09/13/2020   Stroke ([place]) 09/12/2020   Aortic thrombus (HCC)    Hyperlipidemia    Essential hypertension    Impaired fasting glucose    Cerebellar stroke ([place]) 09/11/2020    Past Surgical History:  Procedure Laterality Date   MANDIBLE FRACTURE SURGERY     NO PAST SURGERIES      Family History  Problem Relation Age of Onset   Kidney disease Mother    Hip fracture Father     Social History   Tobacco Use   Smoking status: Never   Smokeless tobacco: Never  Substance Use Topics   Alcohol use: Yes    Alcohol/week: 6.0 standard drinks of alcohol  Types: 6 Cans of beer per week     Current Outpatient Medications:    aspirin EC 81 MG tablet, Take 1 tablet (81 mg total) by mouth daily. Swallow whole., Disp: 30 tablet, Rfl: 11   amLODipine (NORVASC) 10 MG tablet, Take 1 tablet (10 mg total) by mouth daily., Disp: 90 tablet, Rfl: 1   atorvastatin (LIPITOR) 80 MG tablet, Take 1 tablet (80 mg total) by mouth daily., Disp: 90 tablet, Rfl: 1   hydrochlorothiazide (MICROZIDE) 12.5 MG capsule, Take 1  capsule (12.5 mg total) by mouth daily., Disp: 90 capsule, Rfl: 1   metFORMIN (GLUCOPHAGE) 500 MG tablet, Take 1 tablet (500 mg total) by mouth daily with breakfast. .need, Disp: 90 tablet, Rfl: 1  No Known Allergies  I personally reviewed active problem list, medication list, allergies, notes from last encounter, notes from last 1 encounters, lab results with the patient/caregiver today.   Review of Systems  Constitutional:  Negative for chills, fever and malaise/fatigue.  Eyes:  Negative for blurred vision and double vision.  Respiratory:  Negative for cough, shortness of breath and wheezing.   Cardiovascular:  Negative for chest pain, palpitations and leg swelling.  Gastrointestinal:  Negative for diarrhea, heartburn, nausea and vomiting.  Neurological:  Positive for weakness (in left side following CVA). Negative for dizziness and headaches.      Objective  Vitals:   10/07/21 0928 10/07/21 0936  BP: (!) 163/91 (!) 146/99  Pulse: 76 76  Temp: 98.2 F (36.8 C)   TempSrc: Oral   SpO2: 99%   Weight: 141 lb 4.8 oz (64.1 kg)   Height: '5\' 5"'$  (1.651 m)     Body mass index is 23.51 kg/m.  Physical Exam Vitals reviewed.  Constitutional:      General: He is awake.     Appearance: Normal appearance. He is well-developed, well-groomed and normal weight.  HENT:     Head: Normocephalic and atraumatic.     Mouth/Throat:     Mouth: Mucous membranes are moist.  Eyes:     General: Lids are normal. Gaze aligned appropriately.     Extraocular Movements: Extraocular movements intact.     Conjunctiva/sclera: Conjunctivae normal.  Cardiovascular:     Rate and Rhythm: Normal rate and regular rhythm.     Pulses: Normal pulses.          Radial pulses are 2+ on the right side and 2+ on the left side.     Heart sounds: Normal heart sounds.  Pulmonary:     Effort: Pulmonary effort is normal.     Breath sounds: Normal breath sounds. No decreased air movement. No decreased breath sounds,  wheezing, rhonchi or rales.  Musculoskeletal:     Cervical back: Normal range of motion and neck supple.     Right lower leg: No edema.     Left lower leg: No edema.  Neurological:     Mental Status: He is alert and oriented to person, place, and time.     GCS: GCS eye subscore is 4. GCS verbal subscore is 5. GCS motor subscore is 6.     Cranial Nerves: No facial asymmetry.     Motor: No weakness, tremor or atrophy.  Psychiatric:        Attention and Perception: Attention and perception normal.        Mood and Affect: Mood and affect normal.        Speech: Speech normal.        Behavior: Behavior  normal. Behavior is cooperative.        Thought Content: Thought content normal.      Recent Results (from the past 2160 hour(s))  Bayer DCA Hb A1c Waived (STAT)     Status: Abnormal   Collection Time: 10/07/21 10:14 AM  Result Value Ref Range   HB A1C (BAYER DCA - WAIVED) 6.2 (H) 4.8 - 5.6 %    Comment:          Prediabetes: 5.7 - 6.4          Diabetes: >6.4          Glycemic control for adults with diabetes: <7.0      PHQ2/9:    10/07/2021    9:43 AM 07/02/2021    8:49 AM 04/04/2021   10:01 AM 01/27/2021   10:05 AM 11/27/2020   11:19 AM  Depression screen PHQ 2/9  Decreased Interest 2 0 0 0 0  Down, Depressed, Hopeless 0 0 0 0 0  PHQ - 2 Score 2 0 0 0 0  Altered sleeping 0 0 0 0 0  Tired, decreased energy 2 1 0 0 0  Change in appetite 0 0 0 0 0  Feeling bad or failure about yourself  0 0 0 0 0  Trouble concentrating 0 0 0 0 0  Moving slowly or fidgety/restless 0 0 0 0 0  Suicidal thoughts 0 0 0 0 0  PHQ-9 Score 4 1 0 0 0  Difficult doing work/chores Somewhat difficult Not difficult at all Not difficult at all Not difficult at all       Fall Risk:    10/07/2021    9:42 AM 07/02/2021    8:49 AM 04/04/2021   10:00 AM 01/27/2021   10:05 AM 11/27/2020   11:19 AM  [place] in the past year? 0 0 0 0 0  Number falls in past yr: 0 0 0 0 0  Injury with Fall? 0 0  0 0 0  Risk for fall due to :  No Fall Risks No Fall Risks No Fall Risks No Fall Risks  Follow up Falls evaluation completed Falls evaluation completed Falls evaluation completed Falls evaluation completed Falls evaluation completed      Functional Status Survey:      Assessment & Plan  Problem List Items Addressed This Visit       Cardiovascular and Mediastinum   Essential hypertension - Primary    Chronic, historic condition Is currently taking Amlodipine 10 mg PO QD and HCTZ 12.5 mg PO QD and appears to be tolerating well BP elevated in office but pt reports home readings are in goal Continue current medications Recommend continuing to eat diet with lean meats, plenty of vegetables, and low carbs Recommend he engage In regular exercise as tolerated Follow up in 6 months for monitoring       Relevant Medications   amLODipine (NORVASC) 10 MG tablet   hydrochlorothiazide (MICROZIDE) 12.5 MG capsule   atorvastatin (LIPITOR) 80 MG tablet     Endocrine   Hyperlipidemia associated with type 2 diabetes mellitus (HCC)    Chronic, historic condition Reviewed previous Lipid panel results from May 2023 -cholesterol in goal with Atorvastatin 80 mg PO QD Continue current medications and diet  Recommend follow up in 6 months for monitoring       Relevant Medications   amLODipine (NORVASC) 10 MG tablet   hydrochlorothiazide (MICROZIDE) 12.5 MG capsule   metFORMIN (GLUCOPHAGE) 500 MG tablet  atorvastatin (LIPITOR) 80 MG tablet   Diabetes mellitus without complication (HCC)    Chronic, ongoing condition  A1c was 6.2 today  Patient is currently taking Metformin 500 mg PO QAM and appears to be tolerating well Foot exam completed today Discussed importance of regular eye exams to monitor for retinopathy Recommend regular exercise and diabetes-conscious diet Follow up in 6 months for monitoring as A1c is well within goal at this time.       Relevant Medications   metFORMIN  (GLUCOPHAGE) 500 MG tablet   atorvastatin (LIPITOR) 80 MG tablet   Other Relevant Orders   Bayer DCA Hb A1c Waived (STAT) (Completed)     Return in about 6 months (around 04/09/2022) for HTN, Diabetes follow up.   I, Rakeya Glab E Jacora Hopkins, PA-C, have reviewed all documentation for this visit. The documentation on 10/07/21 for the exam, diagnosis, procedures, and orders are all accurate and complete.   Talitha Givens, MHS, PA-C [facility]

## 2021-10-07 NOTE — Assessment & Plan Note (Signed)
Chronic, historic condition Is currently taking Amlodipine 10 mg PO QD and HCTZ 12.5 mg PO QD and appears to be tolerating well BP elevated in office but pt reports home readings are in goal Continue current medications Recommend continuing to eat diet with lean meats, plenty of vegetables, and low carbs Recommend he engage In regular exercise as tolerated Follow up in 6 months for monitoring

## 2021-10-14 ENCOUNTER — Ambulatory Visit (INDEPENDENT_AMBULATORY_CARE_PROVIDER_SITE_OTHER): Payer: Self-pay | Admitting: Thoracic Surgery (Cardiothoracic Vascular Surgery)

## 2021-10-14 ENCOUNTER — Encounter: Payer: Self-pay | Admitting: Thoracic Surgery (Cardiothoracic Vascular Surgery)

## 2021-10-14 ENCOUNTER — Inpatient Hospital Stay: Admission: RE | Admit: 2021-10-14 | Payer: BC Managed Care – PPO | Source: Ambulatory Visit

## 2021-10-14 ENCOUNTER — Ambulatory Visit
Admission: RE | Admit: 2021-10-14 | Discharge: 2021-10-14 | Disposition: A | Discharge status: Home / Self Care | Payer: Self-pay | Source: Ambulatory Visit | Attending: Thoracic Surgery (Cardiothoracic Vascular Surgery) | Admitting: Thoracic Surgery (Cardiothoracic Vascular Surgery)

## 2021-10-14 VITALS — BP 144/85 | HR 82 | Resp 20 | Ht 65.0 in | Wt 142.0 lb

## 2021-10-14 DIAGNOSIS — I7410 Embolism and thrombosis of unspecified parts of aorta: Secondary | ICD-10-CM

## 2021-10-14 DIAGNOSIS — D862 Sarcoidosis of lung with sarcoidosis of lymph nodes: Secondary | ICD-10-CM

## 2021-10-14 DIAGNOSIS — R590 Localized enlarged lymph nodes: Secondary | ICD-10-CM

## 2021-10-14 MED ORDER — IOPAMIDOL (ISOVUE-370) INJECTION 76%
75.0000 mL | Freq: Once | INTRAVENOUS | Status: AC | PRN
  Administered 2021-10-14: 75 mL via INTRAVENOUS

## 2021-10-14 NOTE — Progress Notes (Signed)
[address]       [place] [address]             [phone]     HPI: Malik Jones returns for follow-up of mediastinal adenopathy.  Malik Jones is a 61 year old man with a history of hypertension, hyperlipidemia, stroke, ascending aortic thrombus, diabetes, and sarcoidosis.  He presented in August 2022 with a stroke.  A CT of the head and neck showed an ascending aortic thrombus.  A CT of the chest a couple days later showed no residual thrombus after anticoagulation.  On his scan he was noted to have mediastinal adenopathy and nodularity in both lungs.  We have been following him for that.  In the interim since his last visit he continues to have and occasional heat sensation on his left side.  Able to walk and no strength deficit on the left side but does fatigue more easily on the left side.  No shortness of breath, cough, or wheezing.  Past Medical History:  Diagnosis Date   Diabetes mellitus without complication ([place])    Hyperlipidemia    Hypertension    Hyponatremia    Medical history non-contributory    Primary hypertension 11/27/2020   Stroke [facility])      Current Outpatient Medications  Medication Sig Dispense Refill   amLODipine (NORVASC) 10 MG tablet Take 1 tablet (10 mg total) by mouth daily. 90 tablet 1   aspirin EC 81 MG tablet Take 1 tablet (81 mg total) by mouth daily. Swallow whole. 30 tablet 11   atorvastatin (LIPITOR) 80 MG tablet Take 1 tablet (80 mg total) by mouth daily. 90 tablet 1   hydrochlorothiazide (MICROZIDE) 12.5 MG capsule Take 1 capsule (12.5 mg total) by mouth daily. 90 capsule 1   metFORMIN (GLUCOPHAGE) 500 MG tablet Take 1 tablet (500 mg total) by mouth daily with breakfast. .need 90 tablet 1   No current facility-administered medications for this visit.    Physical Exam BP (!) 144/85 (BP Location: Right Arm, Patient Position: Sitting)   Pulse 82   Resp 20   Ht '5\' 5"'$  (1.651 m)   Wt 142 lb (64.4 kg)   SpO2 99%  Comment: RA  BMI 23.54 kg/m  61 year old man in no acute distress Alert and oriented x3 with no motor deficit bilaterally Cardiac regular rate and rhythm normal S1 and S2 with no murmur Lungs coarse breath sounds bilaterally  Diagnostic Tests: CT ANGIOGRAPHY CHEST WITH CONTRAST   TECHNIQUE: Multidetector CT imaging of the chest was performed using the standard protocol during bolus administration of intravenous contrast. Multiplanar CT image reconstructions and MIPs were obtained to evaluate the vascular anatomy.   RADIATION DOSE REDUCTION: This exam was performed according to the departmental dose-optimization program which includes automated exposure control, adjustment of the mA and/or kV according to patient size and/or use of iterative reconstruction technique.   CONTRAST:  22m ISOVUE-370 IOPAMIDOL (ISOVUE-370) INJECTION 76%   COMPARISON:  07/01/2021, 02/27/2021   FINDINGS: Cardiovascular: Preferential opacification of the thoracic aorta. No evidence of thoracic aortic aneurysm or dissection. Tubular ascending thoracic aorta measures up to 3.4 x 3.2 cm. Normal heart size. No pericardial effusion.   Mediastinum/Nodes: Unchanged prominent mediastinal and bilateral hilar lymph nodes. Unchanged anterior mediastinal nodule or lymph node measuring 1.5 x 1.0 cm (series 4, image 35). Thyroid gland, trachea, and esophagus demonstrate no significant findings.   Lungs/Pleura: Extensive, upper lobe predominant clustered, confluent stellate nodularity, unchanged (series 6, image 40).  No pleural effusion or pneumothorax.   Upper Abdomen: No acute abnormality., Benign hemangioma of the anterior left lobe of the liver, better characterized by prior MR (series 4, image 131).   Musculoskeletal: No chest wall abnormality. No acute osseous findings.   Review of the MIP images confirms the above findings.   IMPRESSION: 1. No evidence of thoracic aortic aneurysm. Tubular  ascending thoracic aorta measures up to 3.4 x 3.2 cm. 2. Extensive, upper lobe predominant clustered, confluent stellate nodularity, unchanged, consistent with pulmonary sarcoidosis. 3. Unchanged prominent mediastinal and bilateral hilar lymph nodes, most in keeping with nodal sarcoidosis. 4. Unchanged anterior mediastinal nodule or lymph node measuring 1.5 x 1.0 cm.     Electronically Signed   By: Delanna Ahmadi M.D.   On: 10/14/2021 11:47   I personally reviewed the CT images.  Unchanged mediastinal and hilar adenopathy.  Clustered nodularity in both upper lobes.  Impression: Malik Jones is a 60 year old man with a history of hypertension, hyperlipidemia, stroke, ascending aortic thrombus, diabetes, and sarcoidosis.    Ascending aortic thrombus with stroke-thrombus resolved with anticoagulation.  No evidence of recurrent thrombus on today's CT.  No longer on anticoagulation.  He is on an aspirin a day.  Lung nodularity with hilar and mediastinal adenopathy-consistent with sarcoidosis.  There is a 1.5 x 1 cm paratracheal node that is stable over the past couple of CTs and significantly reduced in size compared to his CT his original presentation.  Almost certainly sarcoidosis.  We will get another CT in about a year just to make sure that does not change.  Anterior mediastinal nodule-stable also likely due to sarcoidosis.  We will recheck in a year.  Plan: Return in 1 year with CT chest  Melrose Nakayama, MD [facility] [phone]

## 2021-12-17 NOTE — Progress Notes (Unsigned)
[facility] [address] [place]. [place] [address] [phone]       STROKE FOLLOW UP NOTE  [name] Date of Birth:  09/10/1960 Medical Record Number:  [id number]   Reason for Referral: stroke follow up    SUBJECTIVE:   CHIEF COMPLAINT:  No chief complaint on file.   HPI:   Update 12/17/2021 JM: Patient returns for 59-monthstroke follow-up.  Overall stable without new stroke/TIA symptoms.  Reports residual ***.  Remains on aspirin 81 mg daily and atorvastatin.  Blood pressure well controlled.  Routinely follows with PCP and cardiothoracic.      History provided for reference purposes only Update 06/12/2021 JM: Patient returns for 435-monthtroke follow-up.  Overall stable from stroke standpoint without new stroke/TIA symptoms.  Reports stable residual left-sided sensory impairment, right-sided ataxia and gait impairment.  Tries to keep active but does need to take breaks frequently.  Currently in the process of applying for Social Security disability, completed short-term disability and does not have long term disability. F/u with cardiothoracic Dr. HeRoxan Hockeyith repeat CTA chest without evidence of residual thrombus, Eliquis discontinued and transition to aspirin 81 mg daily.  He has remained on aspirin and atorvastatin, denies side effects.  Blood pressure today 147/93.  Apparently, prior PCP left office and has scheduled visit on 5/20 with new PCP.  No further concerns at this time.  Update 02/13/2020 JM: [name] here today alone for a stroke follow-up. His brother brought him to prior visit but unfortunately he passed away 2 nights ago.  Reports improving left-sided sensory impairment with occasional numbness, gait impairment and right sided ataxia with weak right hand. He has completed therapies. Remains on disability - initially started by PMR but he has been released from their office and per patient, PCP refused completing forms as he was not  an active patient prior to stroke.  He does not feel as though he would be able to return back to work at [employer] the [employer] section as he can only ambulate for short distance and has difficulty functioning with left hand due to numbness and right hand due to weakness which makes picking up large/heavy objects difficult but otherwise good function with RUE. Denies new stroke/TIA symptoms. Lives alone performing ADLs and IADLs independently. Remains on Eliquis and Atorvastatin without side effects. BP today 137/84. Labs A1c 6.4, LDL 85 11/27/2020.  No further concerns at this time.  Initial visit 11/07/2020 JM: [name] being seen for hospital follow-up unaccompanied (brother waiting in lobby).  Reports left-sided sensory impairment with occasional numbness, right sided ataxia and imbalance. Gradually improving currently working with PT/OT.  [facility] any [facility].  He lives alone but has supportive family checking on him frequently.  Able to maintain ADLs indendently and has been gradually returning back to some IADLs.  He has not yet returned back to work at [employer] in the [place] on short-term disability.  Denies new stroke/TIA symptoms.  Compliant on Eliquis and atorvastatin without side effects.  Blood pressure today 139/89.  Does not have a BP cuff at home to monitor.  Compliant on amlodipine and hydrochlorothiazide.  He has an appointment to establish care with PCP on 10/19.  No further concerns at this time.  Stroke admission 09/11/2020 Malik Jones a 89.o. male with no significant medical history other than marijuana use who [facility]  for evaluation of gait disturbance and clumsiness on the right upper and lower extremities that started the night prior.  CT head showed acute/subacute right superior cerebellar infarction as well as remote left superior cerebellar infarct.  Vessel imaging showed  inherent or free-floating thrombus in the ascending aorta and started on heparin drip.  He was transferred to [facility] on 8/4 for further evaluation and care and possible intervention by cardiothoracic surgery.  Personally reviewed hospitalization pertinent progress notes, lab work and imaging.  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  EF 70 to 75% without cardiac source of embolus identified.  LDL 169.  A1c 6.5.  Remains on IV heparin during hospitalization and recommend transitioning to Eliquis at discharge for 3 to 6 months and then repeat CT angio and if clot resolved switch to antiplatelet therapy.  Continue to be monitored by cardiology.  UDS positive THC.  Initiated atorvastatin 40 mg daily. Eval by Dr. Stanford Breed for input on anticoagulation and recommended nonemergent cardiac surgery evaluation in the future.  Dr. Roxan Hockey consulted recommend repeat CTA chest showing previously identified adherent thrombus no longer visible.  Recommend repeat CT chest to 3 months to monitor for resolution of mediastinal adenopathy likely infectious in nature.  PT/OT/SLP recommended CIR for residual right hemiparesis with ataxia, left sensory deficits and deficits in higher level cognitive tasks.     PERTINENT IMAGING  CT HEAD 06/11/2020 IMPRESSION: 1. Acute/subacute nonhemorrhagic right superior cerebellar infarct. 2. Remote left superior cerebellar infarct. 3. Atherosclerosis.  CTA HEAD NECK 09/28/2020 IMPRESSION: Suboptimal contrast bolus timing.   Focal free floating or adherent thrombus along the ascending thoracic aorta.   Major cerebellar artery origins are not well evaluated due to contrast timing. Probable patent right superior cerebellar artery origin with suspected subsequent occlusion.   Perfusion imaging is likely providing incomplete information in the posterior fossa. There is 3 mL of penumbra identified partially corresponding to the area of right superior  cerebellar infarction on the noncontrast head CT.   Foci of nodular consolidation in the visualized lungs suspicious for pneumonia. Reactive mediastinal adenopathy.  MR BRAIN WO CONTRAST 09/13/2020 IMPRESSION: 1. 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 Mild mass effect on the fourth ventricle which remains patent. No hydrocephalus. No evidence of hemorrhagic transformation. 2. Remote left superior cerebellar artery territory infarct. 3. Mild-to-moderate chronic microvascular ischemic changes of the white matter.  CTA CHEST 09/17/2019 IMPRESSION: 1. The previously identified wall adherent thrombus in the anterior wall of the ascending thoracic aorta is no longer visualized. No other filling defects identified in the thoracic aorta or within the heart. 2. Nodular consolidation cenetered in the bilateral upper lobes with reactive mediastinal lymphadenopathy is most likely infectious pneumonia. Consider follow-up CT chest in 2-3 months to ensure resolution.      ROS:   14 system review of systems performed and negative with exception of those listed in HPI  PMH:  Past Medical History:  Diagnosis Date   Diabetes mellitus without complication ([place])    Hyperlipidemia    Hypertension    Hyponatremia    Medical history non-contributory    Primary hypertension 11/27/2020   Stroke ([place])     PSH:  Past Surgical History:  Procedure Laterality Date   MANDIBLE FRACTURE SURGERY     NO PAST SURGERIES      Social History:  Social History   Socioeconomic History   Marital status: Single    Spouse name: Not on file   Number of  children: Not on file   Years of education: Not on file   Highest education level: Not on file  Occupational History   Not on file  Tobacco Use   Smoking status: Never   Smokeless tobacco: Never  Vaping Use   Vaping Use: Never used  Substance and Sexual Activity   Alcohol use: Yes    Alcohol/week: 6.0 standard  drinks of alcohol    Types: 6 Cans of beer per week   Drug use: Yes    Types: Marijuana   Sexual activity: Not Currently  Other Topics Concern   Not on file  Social History Narrative   Not on file   Social Determinants of Health   Financial Resource Strain: Not on file  Food Insecurity: Not on file  Transportation Needs: Not on file  Physical Activity: Not on file  Stress: Not on file  Social Connections: Not on file  Intimate Partner Violence: Not on file    Family History:  Family History  Problem Relation Age of Onset   Kidney disease Mother    Hip fracture Father     Medications:   Current Outpatient Medications on File Prior to Visit  Medication Sig Dispense Refill   amLODipine (NORVASC) 10 MG tablet Take 1 tablet (10 mg total) by mouth daily. 90 tablet 1   aspirin EC 81 MG tablet Take 1 tablet (81 mg total) by mouth daily. Swallow whole. 30 tablet 11   atorvastatin (LIPITOR) 80 MG tablet Take 1 tablet (80 mg total) by mouth daily. 90 tablet 1   hydrochlorothiazide (MICROZIDE) 12.5 MG capsule Take 1 capsule (12.5 mg total) by mouth daily. 90 capsule 1   metFORMIN (GLUCOPHAGE) 500 MG tablet Take 1 tablet (500 mg total) by mouth daily with breakfast. .need 90 tablet 1   No current facility-administered medications on file prior to visit.    Allergies:  No Known Allergies    OBJECTIVE:  Physical Exam  There were no vitals filed for this visit.   There is no height or weight on file to calculate BMI. No results found.  General: well developed, well nourished, very pleasant middle-aged African-American male, seated, in no evident distress Head: head normocephalic and atraumatic.   Neck: supple with no carotid or supraclavicular bruits Cardiovascular: regular rate and rhythm, no murmurs Musculoskeletal: no deformity Skin:  no rash/petichiae Vascular:  Normal pulses all extremities   Neurologic Exam Mental Status: Awake and fully alert. Mild dysarthria  (per patient, baseline speech).  No evidence of aphasia.  Oriented to place and time. Recent and remote memory intact. Attention span, concentration and fund of knowledge appropriate during visit. Mood and affect appropriate.  Cranial Nerves: Pupils equal, briskly reactive to light. Extraocular movements full without nystagmus. Visual fields full to confrontation. Hearing intact. Facial sensation intact. Face, tongue, palate moves normally and symmetrically.  Motor: Normal bulk and tone. Normal strength in all tested extremity muscles except mildly weak right hand grip and decreased finger dexterity Sensory: intact to pinprick , position and vibratory sensation throughout but decreased light touch sensation on LUE and LLE.  Coordination: Rapid alternating movements normal in all extremities except decreased right hand. Finger-to-nose and heel-to-shin right-sided ataxia, normal on left. Gait and Station: Arises from chair without difficulty. Stance is normal. Gait demonstrates ataxic gait with mild unsteadiness without use of assistive device.  Difficulty performing tandem walk and heel toe.  Romberg negative Reflexes: 1+ and symmetric. Toes downgoing.        ASSESSMENT:  Malik Jones is a 61 y.o. 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 Vascular risk factors include HLD, pre-DM, THC use and aortic thrombus.      PLAN:  Right cerebellar stroke:  Residual deficit: Left sensory impairment, right-sided ataxia and gait impairment.  Therapies completed. Encouraged he continue to apply for Social Security disability as he would likely have great difficulty doing majority of job functions due to residual deficits. Will provide paper for handicap placard renewal due to difficulty ambulating long distance. 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   Discussed secondary stroke prevention measures and importance of close PCP follow up for aggressive stroke risk factor management including BP goal<130/90, HLD with LDL goal<70 and pre-DM with A1c.<7 .  Stroke labs: LDL 52 (06/2021), A1c 6.2 (09/2021) I have gone over the pathophysiology of stroke, warning signs and symptoms, risk factors and their management in some detail with instructions to go to the closest emergency room for symptoms of concern. Aortic thrombus: f/u with cardiothoracic. Completed anticoagulation duration. Continue aspirin.      Follow up in 6 months or call earlier if needed     I spent 34 minutes of face-to-face and non-face-to-face time with patient. This included previsit chart review, lab review, study review, electronic health record documentation, patient education regarding prior stroke including etiology and residual stroke deficits, secondary stroke prevention measures and importance of managing stroke risk factors, disability concerns and answered all other questions to patient satisfaction  Frann Rider, AGNP-BC  [facility] [address] [place], [place] [address]  Phone [phone] Fax [phone] Note: This document was prepared with digital dictation and possible smart phrase technology. Any transcriptional errors that result from this process are unintentional.

## 2021-12-18 ENCOUNTER — Ambulatory Visit (INDEPENDENT_AMBULATORY_CARE_PROVIDER_SITE_OTHER): Payer: Self-pay | Admitting: Adult Health

## 2021-12-18 ENCOUNTER — Encounter: Payer: Self-pay | Admitting: Adult Health

## 2021-12-18 VITALS — BP 144/92 | HR 71 | Ht 65.0 in | Wt 137.5 lb

## 2021-12-18 DIAGNOSIS — I69393 Ataxia following cerebral infarction: Secondary | ICD-10-CM

## 2021-12-18 DIAGNOSIS — I639 Cerebral infarction, unspecified: Secondary | ICD-10-CM

## 2021-12-18 DIAGNOSIS — R29818 Other symptoms and signs involving the nervous system: Secondary | ICD-10-CM

## 2021-12-18 NOTE — Patient Instructions (Signed)
Please let me know if you would like to participate in any type of therapies once insurance is approved   Continue aspirin 81 mg daily  and atorvastatin for secondary stroke prevention  Continue to follow up with PCP regarding blood pressure and cholesterol management  Maintain strict control of hypertension with blood pressure goal below 130/90 and cholesterol with LDL cholesterol (bad cholesterol) goal below 70 mg/dL.   Signs of a Stroke? Follow the BEFAST method:  Balance Watch for a sudden loss of balance, trouble with coordination or vertigo Eyes Is there a sudden loss of vision in one or both eyes? Or double vision?  Face: Ask the person to smile. Does one side of the face droop or is it numb?  Arms: Ask the person to raise both arms. Does one arm drift downward? Is there weakness or numbness of a leg? Speech: Ask the person to repeat a simple phrase. Does the speech sound slurred/strange? Is the person confused ? Time: If you observe any of these signs, call 911.       Thank you for coming to see [place] at [facility]. I hope we have been able to provide you high quality care today.  You may receive a patient satisfaction survey over the next few weeks. We would appreciate your feedback and comments so that we may continue to improve ourselves and the health of our patients.

## 2022-01-27 DIAGNOSIS — Z0271 Encounter for disability determination: Secondary | ICD-10-CM

## 2022-03-13 ENCOUNTER — Other Ambulatory Visit: Payer: Self-pay | Admitting: Physician Assistant

## 2022-03-13 DIAGNOSIS — I10 Essential (primary) hypertension: Secondary | ICD-10-CM

## 2022-03-13 NOTE — Telephone Encounter (Signed)
Requested medications are due for refill today.  yes  Requested medications are on the active medications list.  yes  Last refill. 10/07/2021 #90 1 rf  Future visit scheduled.   yes  Notes to clinic.  Dr. Neomia Dear listed as pcp.    Requested Prescriptions  Pending Prescriptions Disp Refills   amLODipine (NORVASC) 10 MG tablet [Pharmacy Med Name: amLODIPine Besylate 10 MG Oral Tablet] 90 tablet 0    Sig: Take 1 tablet by mouth once daily     Cardiovascular: Calcium Channel Blockers 2 Failed - 03/13/2022  6:52 AM      Failed - Last BP in normal range    BP Readings from Last 1 Encounters:  12/18/21 (!) 144/92         Passed - Last Heart Rate in normal range    Pulse Readings from Last 1 Encounters:  12/18/21 71         Passed - Valid encounter within last 6 months    Recent Outpatient Visits           5 months ago Essential hypertension   [place] [facility] Mecum, Erin E, PA-C   8 months ago Hyperlipidemia associated with type 2 diabetes mellitus ([place])   [place] [facility] Vigg, Avanti, MD   11 months ago Mediastinal lymphadenopathy   [place] Vigg, Avanti, MD   1 year ago Screen for colon cancer   [place] Vigg, Avanti, MD   1 year ago Essential hypertension   [place] Vigg, Avanti, MD       Future Appointments             In 3 weeks Cannady, Barbaraann Faster, NP [place], [facility]

## 2022-03-16 ENCOUNTER — Other Ambulatory Visit: Payer: Self-pay | Admitting: Physician Assistant

## 2022-03-16 DIAGNOSIS — I10 Essential (primary) hypertension: Secondary | ICD-10-CM

## 2022-03-17 ENCOUNTER — Telehealth: Payer: Self-pay | Admitting: Nurse Practitioner

## 2022-03-17 NOTE — Telephone Encounter (Signed)
Requested Prescriptions  Pending Prescriptions Disp Refills   amLODipine (NORVASC) 10 MG tablet [Pharmacy Med Name: amLODIPine Besylate 10 MG Oral Tablet] 90 tablet 0    Sig: Take 1 tablet by mouth once daily     Cardiovascular: Calcium Channel Blockers 2 Failed - 03/16/2022 12:27 PM      Failed - Last BP in normal range    BP Readings from Last 1 Encounters:  12/18/21 (!) 144/92         Passed - Last Heart Rate in normal range    Pulse Readings from Last 1 Encounters:  12/18/21 71         Passed - Valid encounter within last 6 months    Recent Outpatient Visits           5 months ago Essential hypertension   [place] [facility] Mecum, Erin E, PA-C   8 months ago Hyperlipidemia associated with type 2 diabetes mellitus ([place])   [place] [facility] Vigg, Avanti, MD   11 months ago Mediastinal lymphadenopathy   [place] Vigg, Avanti, MD   1 year ago Screen for colon cancer   [place] Vigg, Avanti, MD   1 year ago Essential hypertension   [place] Vigg, Avanti, MD       Future Appointments             In 3 weeks Cannady, Barbaraann Faster, NP [place], [facility]             hydrochlorothiazide (MICROZIDE) 12.5 MG capsule [Pharmacy Med Name: hydroCHLOROthiazide 12.5 MG Oral Capsule] 90 capsule 0    Sig: Take 1 capsule by mouth once daily     Cardiovascular: Diuretics - Thiazide Failed - 03/16/2022 12:27 PM      Failed - Cr in normal range and within 180 days    Creatinine, Ser  Date Value Ref Range Status  07/02/2021 0.94 0.76 - 1.27 mg/dL Final         Failed - K in normal range and within 180 days    Potassium  Date Value Ref Range Status  07/02/2021 4.0 3.5 - 5.2 mmol/L Final         Failed - Na in normal range and within 180 days    Sodium  Date Value Ref Range Status  07/02/2021 140 134 - 144 mmol/L Final         Failed - Last  BP in normal range    BP Readings from Last 1 Encounters:  12/18/21 (!) 144/92         Passed - Valid encounter within last 6 months    Recent Outpatient Visits           5 months ago Essential hypertension   [place] [facility] Mecum, Erin E, PA-C   8 months ago Hyperlipidemia associated with type 2 diabetes mellitus ([place])   [place] [facility] Vigg, Avanti, MD   11 months ago Mediastinal lymphadenopathy   [place] Vigg, Avanti, MD   1 year ago Screen for colon cancer   [place] Vigg, Avanti, MD   1 year ago Essential hypertension   [place] Vigg, Avanti, MD       Future Appointments             In 3 weeks Cannady, Barbaraann Faster, NP [facility]  [place], [facility]

## 2022-03-17 NOTE — Telephone Encounter (Signed)
Pharmacy request new meds AMLODIPINE 10 MG

## 2022-04-05 NOTE — Patient Instructions (Signed)
Diabetes Mellitus Basics  Diabetes mellitus, or diabetes, is a long-term (chronic) disease. It occurs when the body does not properly use sugar (glucose) that is released from food after you eat. Diabetes mellitus may be caused by one or both of these problems: Your pancreas does not make enough of a hormone called insulin. Your body does not react in a normal way to the insulin that it makes. Insulin lets glucose enter cells in your body. This gives you energy. If you have diabetes, glucose cannot get into cells. This causes high blood glucose (hyperglycemia). How to treat and manage diabetes You may need to take insulin or other diabetes medicines daily to keep your glucose in balance. If you are prescribed insulin, you will learn how to give yourself insulin by injection. You may need to adjust the amount of insulin you take based on the foods that you eat. You will need to check your blood glucose levels using a glucose monitor as told by your health care provider. The readings can help determine if you have low or high blood glucose. Generally, you should have these blood glucose levels: Before meals (preprandial): 80-130 mg/dL (4.4-7.2 mmol/L). After meals (postprandial): below 180 mg/dL (10 mmol/L). Hemoglobin A1c (HbA1c) level: less than 7%. Your health care provider will set treatment goals for you. Keep all follow-up visits. This is important. Follow these instructions at home: Diabetes medicines Take your diabetes medicines every day as told by your health care provider. List your diabetes medicines here: Name of medicine: ______________________________ Amount (dose): _______________ Time (a.m./p.m.): _______________ Notes: ___________________________________ Name of medicine: ______________________________ Amount (dose): _______________ Time (a.m./p.m.): _______________ Notes: ___________________________________ Name of medicine: ______________________________ Amount (dose):  _______________ Time (a.m./p.m.): _______________ Notes: ___________________________________ Insulin If you use insulin, list the types of insulin you use here: Insulin type: ______________________________ Amount (dose): _______________ Time (a.m./p.m.): _______________Notes: ___________________________________ Insulin type: ______________________________ Amount (dose): _______________ Time (a.m./p.m.): _______________ Notes: ___________________________________ Insulin type: ______________________________ Amount (dose): _______________ Time (a.m./p.m.): _______________ Notes: ___________________________________ Insulin type: ______________________________ Amount (dose): _______________ Time (a.m./p.m.): _______________ Notes: ___________________________________ Insulin type: ______________________________ Amount (dose): _______________ Time (a.m./p.m.): _______________ Notes: ___________________________________ Managing blood glucose  Check your blood glucose levels using a glucose monitor as told by your health care provider. Write down the times that you check your glucose levels here: Time: _______________ Notes: ___________________________________ Time: _______________ Notes: ___________________________________ Time: _______________ Notes: ___________________________________ Time: _______________ Notes: ___________________________________ Time: _______________ Notes: ___________________________________ Time: _______________ Notes: ___________________________________  Low blood glucose Low blood glucose (hypoglycemia) is when glucose is at or below 70 mg/dL (3.9 mmol/L). Symptoms may include: Feeling: Hungry. Sweaty and clammy. Irritable or easily upset. Dizzy. Sleepy. Having: A fast heartbeat. A headache. A change in your vision. Numbness around the mouth, lips, or tongue. Having trouble with: Moving (coordination). Sleeping. Treating low blood glucose To treat low blood  glucose, eat or drink something containing sugar right away. If you can think clearly and swallow safely, follow the 15:15 rule: Take 15 grams of a fast-acting carb (carbohydrate), as told by your health care provider. Some fast-acting carbs are: Glucose tablets: take 3-4 tablets. Hard candy: eat 3-5 pieces. Fruit juice: drink 4 oz (120 mL). Regular (not diet) soda: drink 4-6 oz (120-180 mL). Honey or sugar: eat 1 Tbsp (15 mL). Check your blood glucose levels 15 minutes after you take the carb. If your glucose is still at or below 70 mg/dL (3.9 mmol/L), take 15 grams of a carb again. If your glucose does not go above 70 mg/dL (3.9 mmol/L) after   3 tries, get help right away. After your glucose goes back to normal, eat a meal or a snack within 1 hour. Treating very low blood glucose If your glucose is at or below 54 mg/dL (3 mmol/L), you have very low blood glucose (severe hypoglycemia). This is an emergency. Do not wait to see if the symptoms will go away. Get medical help right away. Call your local emergency services (911 in the [place] Do not drive yourself to the hospital. Questions to ask your health care provider Should I talk with a diabetes educator? What equipment will I need to care for myself at home? What diabetes medicines do I need? When should I take them? How often do I need to check my blood glucose levels? What number can I call if I have questions? When is my follow-up visit? Where can I find a support group for people with diabetes? Where to find more information American Diabetes Association: www.diabetes.org Association of Diabetes Care and Education Specialists: www.diabeteseducator.org Contact a health care provider if: Your blood glucose is at or above 240 mg/dL (13.3 mmol/L) for 2 days in a row. You have been sick or have had a fever for 2 days or more, and you are not getting better. You have any of these problems for more than 6 hours: You cannot eat or  drink. You feel nauseous. You vomit. You have diarrhea. Get help right away if: Your blood glucose is lower than 54 mg/dL (3 mmol/L). You get confused. You have trouble thinking clearly. You have trouble breathing. These symptoms may represent a serious problem that is an emergency. Do not wait to see if the symptoms will go away. Get medical help right away. Call your local emergency services (911 in the [place] Do not drive yourself to the hospital. Summary Diabetes mellitus is a chronic disease that occurs when the body does not properly use sugar (glucose) that is released from food after you eat. Take insulin and diabetes medicines as told. Check your blood glucose every day, as often as told. Keep all follow-up visits. This is important. This information is not intended to replace advice given to you by your health care provider. Make sure you discuss any questions you have with your health care provider. Document Revised: 05/30/2019 Document Reviewed: 05/30/2019 Elsevier Patient Education  2023 Elsevier Inc.  

## 2022-04-06 ENCOUNTER — Other Ambulatory Visit: Payer: Self-pay | Admitting: Physical Medicine and Rehabilitation

## 2022-04-08 ENCOUNTER — Other Ambulatory Visit: Payer: Self-pay | Admitting: Physical Medicine and Rehabilitation

## 2022-04-09 ENCOUNTER — Ambulatory Visit: Payer: Self-pay | Admitting: Nurse Practitioner

## 2022-04-09 ENCOUNTER — Encounter: Payer: Self-pay | Admitting: Nurse Practitioner

## 2022-04-09 VITALS — BP 136/81 | HR 80 | Temp 97.8°F | Ht 65.0 in | Wt 140.3 lb

## 2022-04-09 DIAGNOSIS — E785 Hyperlipidemia, unspecified: Secondary | ICD-10-CM

## 2022-04-09 DIAGNOSIS — Z8673 Personal history of transient ischemic attack (TIA), and cerebral infarction without residual deficits: Secondary | ICD-10-CM

## 2022-04-09 DIAGNOSIS — E1169 Type 2 diabetes mellitus with other specified complication: Secondary | ICD-10-CM

## 2022-04-09 DIAGNOSIS — R269 Unspecified abnormalities of gait and mobility: Secondary | ICD-10-CM

## 2022-04-09 DIAGNOSIS — R590 Localized enlarged lymph nodes: Secondary | ICD-10-CM

## 2022-04-09 DIAGNOSIS — D862 Sarcoidosis of lung with sarcoidosis of lymph nodes: Secondary | ICD-10-CM

## 2022-04-09 DIAGNOSIS — E1149 Type 2 diabetes mellitus with other diabetic neurological complication: Secondary | ICD-10-CM

## 2022-04-09 DIAGNOSIS — E1159 Type 2 diabetes mellitus with other circulatory complications: Secondary | ICD-10-CM

## 2022-04-09 DIAGNOSIS — I7410 Embolism and thrombosis of unspecified parts of aorta: Secondary | ICD-10-CM

## 2022-04-09 DIAGNOSIS — I152 Hypertension secondary to endocrine disorders: Secondary | ICD-10-CM

## 2022-04-09 LAB — MICROALBUMIN, URINE WAIVED
Creatinine, Urine Waived: 300 mg/dL (ref 10–300)
Microalb, Ur Waived: 80 mg/L — ABNORMAL HIGH (ref 0–19)

## 2022-04-09 LAB — BAYER DCA HB A1C WAIVED: HB A1C (BAYER DCA - WAIVED): 6.1 % — ABNORMAL HIGH (ref 4.8–5.6)

## 2022-04-09 MED ORDER — ATORVASTATIN CALCIUM 80 MG PO TABS: 80.0000 mg | ORAL_TABLET | Freq: Every day | ORAL | 1 refills | Status: DC

## 2022-04-09 MED ORDER — METFORMIN HCL 500 MG PO TABS: 500.0000 mg | ORAL_TABLET | Freq: Every day | ORAL | 4 refills | Status: DC

## 2022-04-09 MED ORDER — VALSARTAN-HYDROCHLOROTHIAZIDE 80-12.5 MG PO TABS: 1.0000 | ORAL_TABLET | Freq: Every day | ORAL | 3 refills | Status: DC

## 2022-04-09 MED ORDER — ATORVASTATIN CALCIUM 80 MG PO TABS: 80.0000 mg | ORAL_TABLET | Freq: Every day | ORAL | 4 refills | Status: DC

## 2022-04-09 MED ORDER — METFORMIN HCL 500 MG PO TABS: 500.0000 mg | ORAL_TABLET | Freq: Every day | ORAL | 1 refills | Status: DC

## 2022-04-09 NOTE — Assessment & Plan Note (Signed)
Chronic, stable.  Continue collaboration with Dr. Roxan Hockey, cardiothoracic surgery.  Due for rescan in September 2024.

## 2022-04-09 NOTE — Assessment & Plan Note (Addendum)
Chronic, ongoing.  A1c 6.2% today, stable. Urine ALB 80 February 2024 -- adding on Valsartan, refer to HTN plan of care.  Recommend he check blood sugar daily and document for provider visits.  Continue current medication regimen and adjust as needed.  Diabetic diet at home.  Recheck A1c in 6 months. - Foot exam up to date - Eye exam ordered - PPSV up to date

## 2022-04-09 NOTE — Assessment & Plan Note (Signed)
On 09/18/20, followed by neurology.  Will work on goals for stroke prevention -- A1c <6.5%, LDL <55, and BP <130/80.  Discussed with patient.  Recent neurology noted reviewed.

## 2022-04-09 NOTE — Assessment & Plan Note (Signed)
Chronic, ongoing.  Currently not on ACE/ARB.  Discussed with him at length.  Urine ALB 80 February 2024.  Will start Valsartan 80 MG (add on as combo with HCTZ) - educated him on this and change + side effects.  Continue Amlodipine at current dosing.  Recommend she monitor BP at least a few mornings a week at home and document.  DASH diet at home.  Labs today: CMP.  Return in 4 weeks.

## 2022-04-09 NOTE — Assessment & Plan Note (Signed)
Chronic, ongoing. Continue current medication regimen and adjust as needed.  Lipid panel today. 

## 2022-04-09 NOTE — Progress Notes (Signed)
BP 136/81   Pulse 80   Temp 97.8 F (36.6 C) (Oral)   Ht '5\' 5"'$  (1.651 m)   Wt 140 lb 4.8 oz (63.6 kg)   SpO2 99%   BMI 23.35 kg/m    Subjective:    Patient ID: Malik Jones, male    DOB: 18-Jul-1960, 62 y.o.   MRN: [id number]  HPI: Malik Jones is a 62 y.o. male  Chief Complaint  Patient presents with   Diabetes   DIABETES Continues on Metformin.  Last A1c 6.2%.  Hypoglycemic episodes:no Polydipsia/polyuria: no Visual disturbance: no Chest pain: no Paresthesias: no Glucose Monitoring: yes  Accucheck frequency: weekly  Fasting glucose: 99 to 110  Post prandial:  Evening:  Before meals: Taking Insulin?: no  Long acting insulin:  Short acting insulin: Blood Pressure Monitoring: weekly Retinal Examination: Not up to Date --  Foot Exam: Up to Date Pneumovax: Up to Date Influenza: Not up to Date Aspirin: yes   HYPERTENSION / HYPERLIPIDEMIA Continues on HCTZ, Amlodipine, and ASA + Lipitor. Sees Dr. Roxan Hockey with cardiothoracic surgery every 6 months for aortic thrombus, sarcoidosis, and mediastinal adenopathy - plan for repeat CT in one year. Denies any tobacco smoking and has occasional beer -- every other day.    Has history of cerebellar stroke 09/11/20 -- follows with neurology, last visit 12/18/21.  Satisfied with current treatment? yes Duration of hypertension: chronic BP monitoring frequency: weekly BP range: 120/80 range BP medication side effects: no Duration of hyperlipidemia: chronic Cholesterol medication side effects: no Cholesterol supplements: none Medication compliance: good compliance Aspirin: yes Recent stressors: no Recurrent headaches: no Visual changes: no Palpitations: no Dyspnea: no Chest pain: no Lower extremity edema: no Dizzy/lightheaded: no   Relevant past medical, surgical, family and social history reviewed and updated as indicated. Interim medical history since our last visit reviewed. Allergies and medications reviewed  and updated.  Review of Systems  Constitutional:  Negative for activity change, diaphoresis, fatigue and fever.  Respiratory:  Negative for cough, chest tightness, shortness of breath and wheezing.   Cardiovascular:  Negative for chest pain, palpitations and leg swelling.  Gastrointestinal: Negative.   Endocrine: Negative for polydipsia, polyphagia and polyuria.  Neurological: Negative.   Psychiatric/Behavioral: Negative.      Per HPI unless specifically indicated above     Objective:    BP 136/81   Pulse 80   Temp 97.8 F (36.6 C) (Oral)   Ht '5\' 5"'$  (1.651 m)   Wt 140 lb 4.8 oz (63.6 kg)   SpO2 99%   BMI 23.35 kg/m   Wt Readings from Last 3 Encounters:  04/09/22 140 lb 4.8 oz (63.6 kg)  12/18/21 137 lb 8 oz (62.4 kg)  10/14/21 142 lb (64.4 kg)    Physical Exam Vitals and nursing note reviewed.  Constitutional:      General: He is awake. He is not in acute distress.    Appearance: He is well-developed and well-groomed. He is not ill-appearing or toxic-appearing.  HENT:     Head: Normocephalic.     Mouth/Throat:     Dentition: Abnormal dentition (poor dentition).  Eyes:     General: Lids are normal.     Extraocular Movements: Extraocular movements intact.     Conjunctiva/sclera: Conjunctivae normal.  Neck:     Thyroid: No thyromegaly.     Vascular: No carotid bruit.  Cardiovascular:     Rate and Rhythm: Normal rate and regular rhythm.     Heart sounds: Normal heart sounds.  Pulmonary:     Effort: No accessory muscle usage or respiratory distress.     Breath sounds: Normal breath sounds.  Abdominal:     General: Bowel sounds are normal. There is no distension.     Palpations: Abdomen is soft.     Tenderness: There is no abdominal tenderness.  Musculoskeletal:     Cervical back: Full passive range of motion without pain.     Right lower leg: No edema.     Left lower leg: No edema.  Lymphadenopathy:     Cervical: No cervical adenopathy.  Skin:    General:  Skin is warm.     Capillary Refill: Capillary refill takes less than 2 seconds.  Neurological:     Mental Status: He is alert and oriented to person, place, and time.     Deep Tendon Reflexes: Reflexes are normal and symmetric.     Reflex Scores:      Brachioradialis reflexes are 2+ on the right side and 2+ on the left side.      Patellar reflexes are 2+ on the right side and 2+ on the left side. Psychiatric:        Attention and Perception: Attention normal.        Mood and Affect: Mood normal.        Speech: Speech normal.        Behavior: Behavior normal. Behavior is cooperative.        Thought Content: Thought content normal.    Results for orders placed or performed in visit on 10/07/21  Bayer DCA Hb A1c Waived (STAT)  Result Value Ref Range   HB A1C (BAYER DCA - WAIVED) 6.2 (H) 4.8 - 5.6 %      Assessment & Plan:   Problem List Items Addressed This Visit       Cardiovascular and Mediastinum   Aortic thrombus (HCC)    Chronic, ongoing.  Continue collaboration with Dr. Roxan Hockey, cardiothoracic surgery.  Recent notes reviewed.      Relevant Medications   atorvastatin (LIPITOR) 80 MG tablet   valsartan-hydrochlorothiazide (DIOVAN-HCT) 80-12.5 MG tablet   Hypertension associated with type 2 diabetes mellitus (HCC)    Chronic, ongoing.  Currently not on ACE/ARB.  Discussed with him at length.  Urine ALB 80 February 2024.  Will start Valsartan 80 MG (add on as combo with HCTZ) - educated him on this and change + side effects.  Continue Amlodipine at current dosing.  Recommend she monitor BP at least a few mornings a week at home and document.  DASH diet at home.  Labs today: CMP.  Return in 4 weeks.       Relevant Medications   metFORMIN (GLUCOPHAGE) 500 MG tablet   atorvastatin (LIPITOR) 80 MG tablet   valsartan-hydrochlorothiazide (DIOVAN-HCT) 80-12.5 MG tablet   Other Relevant Orders   Bayer DCA Hb A1c Waived   Microalbumin, Urine Waived   Comprehensive metabolic  panel   Mediastinal lymphadenopathy    Chronic, stable.  Continue collaboration with Dr. Roxan Hockey, cardiothoracic surgery.  Due for rescan in September 2024.        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  Continue collaboration with Dr. Roxan Hockey, cardiothoracic surgery.          Endocrine   Hyperlipidemia associated with type 2 diabetes mellitus (HCC)    Chronic, ongoing.  Continue current medication regimen and adjust as needed.  Lipid panel today.  Relevant Medications   metFORMIN (GLUCOPHAGE) 500 MG tablet   atorvastatin (LIPITOR) 80 MG tablet   valsartan-hydrochlorothiazide (DIOVAN-HCT) 80-12.5 MG tablet   Other Relevant Orders   Bayer DCA Hb A1c Waived   Comprehensive metabolic panel   Lipid Panel w/o Chol/HDL Ratio   Type 2 diabetes mellitus with neurological complications (HCC) - Primary    Chronic, ongoing.  A1c 6.2% today, stable. Urine ALB 80 February 2024 -- adding on Valsartan, refer to HTN plan of care.  Recommend he check blood sugar daily and document for provider visits.  Continue current medication regimen and adjust as needed.  Diabetic diet at home.  Recheck A1c in 6 months. - Foot exam up to date - Eye exam ordered - PPSV up to date      Relevant Medications   metFORMIN (GLUCOPHAGE) 500 MG tablet   atorvastatin (LIPITOR) 80 MG tablet   valsartan-hydrochlorothiazide (DIOVAN-HCT) 80-12.5 MG tablet   Other Relevant Orders   Bayer DCA Hb A1c Waived   Microalbumin, Urine Waived   Comprehensive metabolic panel   Ambulatory referral to Ophthalmology     Other   History of cerebellar stroke    On 09/18/20, followed by neurology.  Will work on goals for stroke prevention -- A1c <6.5%, LDL <55, and BP <130/80.  Discussed with patient.  Recent neurology noted reviewed.        Follow up plan: Return in about 4 weeks (around 05/07/2022) for HTN -- added on Valsartan.

## 2022-04-09 NOTE — Assessment & Plan Note (Signed)
Chronic, ongoing.  Continue collaboration with Dr. Roxan Hockey, cardiothoracic surgery.  Recent notes reviewed.

## 2022-04-09 NOTE — Assessment & Plan Note (Signed)
Chronic, ongoing.  Continue collaboration with Dr. Roxan Hockey, cardiothoracic surgery.

## 2022-04-10 LAB — COMPREHENSIVE METABOLIC PANEL
ALT: 20 IU/L (ref 0–44)
AST: 23 IU/L (ref 0–40)
Albumin/Globulin Ratio: 1.7 (ref 1.2–2.2)
Albumin: 4.7 g/dL (ref 3.8–4.9)
Alkaline Phosphatase: 51 IU/L (ref 44–121)
BUN/Creatinine Ratio: 10 (ref 9–20)
BUN: 9 mg/dL (ref 6–24)
Bilirubin Total: 0.4 mg/dL (ref 0.0–1.2)
CO2: 22 mmol/L (ref 20–29)
Calcium: 9.6 mg/dL (ref 8.7–10.2)
Chloride: 100 mmol/L (ref 96–106)
Creatinine, Ser: 0.89 mg/dL (ref 0.76–1.27)
Globulin, Total: 2.8 g/dL (ref 1.5–4.5)
Glucose: 145 mg/dL — ABNORMAL HIGH (ref 70–99)
Potassium: 4.3 mmol/L (ref 3.5–5.2)
Sodium: 137 mmol/L (ref 134–144)
Total Protein: 7.5 g/dL (ref 6.0–8.5)
eGFR: 99 mL/min/{1.73_m2} (ref >59)

## 2022-04-10 LAB — LIPID PANEL W/O CHOL/HDL RATIO
Cholesterol, Total: 156 mg/dL (ref 100–199)
HDL: 56 mg/dL (ref >39)
LDL Chol Calc (NIH): 86 mg/dL (ref 0–99)
Triglycerides: 70 mg/dL (ref 0–149)
VLDL Cholesterol Cal: 14 mg/dL (ref 5–40)

## 2022-04-11 ENCOUNTER — Other Ambulatory Visit: Payer: Self-pay | Admitting: Nurse Practitioner

## 2022-04-11 MED ORDER — EZETIMIBE 10 MG PO TABS: 10.0000 mg | ORAL_TABLET | Freq: Every day | ORAL | 4 refills | Status: DC

## 2022-04-11 NOTE — Progress Notes (Signed)
Contacted via [place] [facility], your labs have returned: - Kidney function, creatinine and eGFR, remains normal, as is liver function, AST and ALT.  - Cholesterol labs are stable, but I would like to see LDL (bad cholesterol) <55 for stroke prevention.  I am going to send in Zetia for you to start taking.  You will take this with your current cholesterol medication, Atorvastatin, every day.  No other medication changes needed.  If any issues with Zetia let me know.  We will recheck labs next visit.  Any questions? Keep being amazing!!  Thank you for allowing me to participate in your care.  I appreciate you. Kindest regards, Arionna Hoggard

## 2022-05-02 NOTE — Patient Instructions (Signed)

## 2022-05-08 ENCOUNTER — Ambulatory Visit (INDEPENDENT_AMBULATORY_CARE_PROVIDER_SITE_OTHER): Payer: Self-pay | Admitting: Nurse Practitioner

## 2022-05-08 ENCOUNTER — Encounter: Payer: Self-pay | Admitting: Nurse Practitioner

## 2022-05-08 VITALS — BP 122/72 | HR 70 | Ht 65.0 in | Wt 142.2 lb

## 2022-05-08 DIAGNOSIS — E1159 Type 2 diabetes mellitus with other circulatory complications: Secondary | ICD-10-CM

## 2022-05-08 DIAGNOSIS — E785 Hyperlipidemia, unspecified: Secondary | ICD-10-CM

## 2022-05-08 DIAGNOSIS — I152 Hypertension secondary to endocrine disorders: Secondary | ICD-10-CM

## 2022-05-08 MED ORDER — AMLODIPINE BESYLATE 10 MG PO TABS: 10.0000 mg | ORAL_TABLET | Freq: Every day | ORAL | 4 refills | Status: DC

## 2022-05-08 MED ORDER — VALSARTAN-HYDROCHLOROTHIAZIDE 80-12.5 MG PO TABS: 1.0000 | ORAL_TABLET | Freq: Every day | ORAL | 4 refills | Status: DC

## 2022-05-08 NOTE — Progress Notes (Signed)
BP 122/72   Pulse 70   Ht 5\' 5"  (1.651 m)   Wt 142 lb 3.2 oz (64.5 kg)   SpO2 97%   BMI 23.66 kg/m    Subjective:    Patient ID: Malik Jones, male    DOB: 28-Jul-1960, 62 y.o.   MRN: [id number]  HPI: Malik Jones is a 62 y.o. male  Chief Complaint  Patient presents with   Hypertension    Added Valsartan at last visit, patient reports no side effects at this time   HYPERTENSION  Added on Valsartan 80 MG at visit 04/09/22.  Continues on Amlodipine and HCTZ.  Has had no ADR with new medication. Hypertension status: stable  Satisfied with current treatment? yes Duration of hypertension: chronic BP monitoring frequency:  a few times a week BP range: 116/76 to 120/77 BP medication side effects:  no Medication compliance: good compliance Aspirin: yes Recurrent headaches: no Visual changes: no Palpitations: no Dyspnea: no Chest pain: no Lower extremity edema: no Dizzy/lightheaded: no   Relevant past medical, surgical, family and social history reviewed and updated as indicated. Interim medical history since our last visit reviewed. Allergies and medications reviewed and updated.  Review of Systems  Constitutional:  Negative for activity change, diaphoresis, fatigue and fever.  Respiratory:  Negative for cough, chest tightness, shortness of breath and wheezing.   Cardiovascular:  Negative for chest pain, palpitations and leg swelling.  Gastrointestinal: Negative.   Endocrine: Negative for polydipsia, polyphagia and polyuria.  Neurological: Negative.   Psychiatric/Behavioral: Negative.     Per HPI unless specifically indicated above     Objective:    BP 122/72   Pulse 70   Ht 5\' 5"  (1.651 m)   Wt 142 lb 3.2 oz (64.5 kg)   SpO2 97%   BMI 23.66 kg/m   Wt Readings from Last 3 Encounters:  05/08/22 142 lb 3.2 oz (64.5 kg)  04/09/22 140 lb 4.8 oz (63.6 kg)  12/18/21 137 lb 8 oz (62.4 kg)    Physical Exam Vitals and nursing note reviewed.  Constitutional:       General: He is awake. He is not in acute distress.    Appearance: He is well-developed and well-groomed. He is not ill-appearing or toxic-appearing.  HENT:     Head: Normocephalic.     Mouth/Throat:     Dentition: Abnormal dentition (poor dentition).  Eyes:     General: Lids are normal.     Extraocular Movements: Extraocular movements intact.     Conjunctiva/sclera: Conjunctivae normal.  Neck:     Thyroid: No thyromegaly.     Vascular: No carotid bruit.  Cardiovascular:     Rate and Rhythm: Normal rate and regular rhythm.     Heart sounds: Normal heart sounds.  Pulmonary:     Effort: No accessory muscle usage or respiratory distress.     Breath sounds: Normal breath sounds.  Abdominal:     General: Bowel sounds are normal. There is no distension.     Palpations: Abdomen is soft.     Tenderness: There is no abdominal tenderness.  Musculoskeletal:     Cervical back: Full passive range of motion without pain.     Right lower leg: No edema.     Left lower leg: No edema.  Lymphadenopathy:     Cervical: No cervical adenopathy.  Skin:    General: Skin is warm.     Capillary Refill: Capillary refill takes less than 2 seconds.  Neurological:  Mental Status: He is alert and oriented to person, place, and time.     Deep Tendon Reflexes: Reflexes are normal and symmetric.     Reflex Scores:      Brachioradialis reflexes are 2+ on the right side and 2+ on the left side.      Patellar reflexes are 2+ on the right side and 2+ on the left side. Psychiatric:        Attention and Perception: Attention normal.        Mood and Affect: Mood normal.        Speech: Speech normal.        Behavior: Behavior normal. Behavior is cooperative.        Thought Content: Thought content normal.    Results for orders placed or performed in visit on 04/09/22  Bayer DCA Hb A1c Waived  Result Value Ref Range   HB A1C (BAYER DCA - WAIVED) 6.1 (H) 4.8 - 5.6 %  Microalbumin, Urine Waived  Result  Value Ref Range   Microalb, Ur Waived 80 (H) 0 - 19 mg/L   Creatinine, Urine Waived 300 10 - 300 mg/dL   Microalb/Creat Ratio 30-300 (H) <30 mg/g  Comprehensive metabolic panel  Result Value Ref Range   Glucose 145 (H) 70 - 99 mg/dL   BUN 9 6 - 24 mg/dL   Creatinine, Ser 0.89 0.76 - 1.27 mg/dL   eGFR 99 >59 mL/min/1.73   BUN/Creatinine Ratio 10 9 - 20   Sodium 137 134 - 144 mmol/L   Potassium 4.3 3.5 - 5.2 mmol/L   Chloride 100 96 - 106 mmol/L   CO2 22 20 - 29 mmol/L   Calcium 9.6 8.7 - 10.2 mg/dL   Total Protein 7.5 6.0 - 8.5 g/dL   Albumin 4.7 3.8 - 4.9 g/dL   Globulin, Total 2.8 1.5 - 4.5 g/dL   Albumin/Globulin Ratio 1.7 1.2 - 2.2   Bilirubin Total 0.4 0.0 - 1.2 mg/dL   Alkaline Phosphatase 51 44 - 121 IU/L   AST 23 0 - 40 IU/L   ALT 20 0 - 44 IU/L  Lipid Panel w/o Chol/HDL Ratio  Result Value Ref Range   Cholesterol, Total 156 100 - 199 mg/dL   Triglycerides 70 0 - 149 mg/dL   HDL 56 >39 mg/dL   VLDL Cholesterol Cal 14 5 - 40 mg/dL   LDL Chol Calc (NIH) 86 0 - 99 mg/dL      Assessment & Plan:   Problem List Items Addressed This Visit       Cardiovascular and Mediastinum   Hypertension associated with type 2 diabetes mellitus ([facility]) - Primary    Chronic, improving.  Valsartan added last visit with benefit.  Urine ALB 80 February 2024.  Continue Valsartan-HCTZ and Amlodipine at current dosing.  Recommend she monitor BP at least a few mornings a week at home and document.  DASH diet at home.  Labs today: BMP.  Return at end of June for diabetes visit.       Relevant Medications   valsartan-hydrochlorothiazide (DIOVAN-HCT) 80-12.5 MG tablet   amLODipine (NORVASC) 10 MG tablet   Other Relevant Orders   Basic metabolic panel     Follow up plan: Return in about 3 months (around 08/08/2022) for T2DM, HTN/HLD.

## 2022-05-08 NOTE — Assessment & Plan Note (Signed)
Chronic, improving.  Valsartan added last visit with benefit.  Urine ALB 80 February 2024.  Continue Valsartan-HCTZ and Amlodipine at current dosing.  Recommend she monitor BP at least a few mornings a week at home and document.  DASH diet at home.  Labs today: BMP.  Return at end of June for diabetes visit.

## 2022-05-09 LAB — BASIC METABOLIC PANEL
BUN/Creatinine Ratio: 12 (ref 9–20)
BUN: 12 mg/dL (ref 6–24)
CO2: 22 mmol/L (ref 20–29)
Calcium: 10.4 mg/dL — ABNORMAL HIGH (ref 8.7–10.2)
Chloride: 100 mmol/L (ref 96–106)
Creatinine, Ser: 1.02 mg/dL (ref 0.76–1.27)
Glucose: 120 mg/dL — ABNORMAL HIGH (ref 70–99)
Potassium: 4.2 mmol/L (ref 3.5–5.2)
Sodium: 139 mmol/L (ref 134–144)
eGFR: 85 mL/min/{1.73_m2} (ref >59)

## 2022-05-09 NOTE — Progress Notes (Signed)
Contacted via [place] evening Malik Jones, your labs have returned and overall are stable. No medication changes needed.  Great news!!!  Any questions for me? Keep being awesome!!  Thank you for allowing me to participate in your care.  I appreciate you. Kindest regards, Brinn Westby

## 2022-08-08 NOTE — Patient Instructions (Signed)
Be Involved in Your Health Care:  Taking Medications When medications are taken as directed, they can greatly improve your health. But if they are not taken as instructed, they may not work. In some cases, not taking them correctly can be harmful. To help ensure your treatment remains effective and safe, understand your medications and how to take them.  Your lab results, notes and after visit summary will be available on My Chart. We strongly encourage you to use this feature. If lab results are abnormal the clinic will contact you with the appropriate steps. If the clinic does not contact you assume the results are satisfactory. You can always see your results on My Chart. If you have questions regarding your condition, please contact the clinic during office hours. You can also ask questions on My Chart.  We at [facility] are grateful that you chose us to provide care. We strive to provide excellent and compassionate care and are always looking for feedback. If you get a survey from the clinic please complete this.   Diabetes Mellitus Basics  Diabetes mellitus, or diabetes, is a long-term (chronic) disease. It occurs when the body does not properly use sugar (glucose) that is released from food after you eat. Diabetes mellitus may be caused by one or both of these problems: Your pancreas does not make enough of a hormone called insulin. Your body does not react in a normal way to the insulin that it makes. Insulin lets glucose enter cells in your body. This gives you energy. If you have diabetes, glucose cannot get into cells. This causes high blood glucose (hyperglycemia). How to treat and manage diabetes You may need to take insulin or other diabetes medicines daily to keep your glucose in balance. If you are prescribed insulin, you will learn how to give yourself insulin by injection. You may need to adjust the amount of insulin you take based on the foods that you eat. You will  need to check your blood glucose levels using a glucose monitor as told by your health care provider. The readings can help determine if you have low or high blood glucose. Generally, you should have these blood glucose levels: Before meals (preprandial): 80-130 mg/dL (4.4-7.2 mmol/L). After meals (postprandial): below 180 mg/dL (10 mmol/L). Hemoglobin A1c (HbA1c) level: less than 7%. Your health care provider will set treatment goals for you. Keep all follow-up visits. This is important. Follow these instructions at home: Diabetes medicines Take your diabetes medicines every day as told by your health care provider. List your diabetes medicines here: Name of medicine: ______________________________ Amount (dose): _______________ Time (a.m./p.m.): _______________ Notes: ___________________________________ Name of medicine: ______________________________ Amount (dose): _______________ Time (a.m./p.m.): _______________ Notes: ___________________________________ Name of medicine: ______________________________ Amount (dose): _______________ Time (a.m./p.m.): _______________ Notes: ___________________________________ Insulin If you use insulin, list the types of insulin you use here: Insulin type: ______________________________ Amount (dose): _______________ Time (a.m./p.m.): _______________Notes: ___________________________________ Insulin type: ______________________________ Amount (dose): _______________ Time (a.m./p.m.): _______________ Notes: ___________________________________ Insulin type: ______________________________ Amount (dose): _______________ Time (a.m./p.m.): _______________ Notes: ___________________________________ Insulin type: ______________________________ Amount (dose): _______________ Time (a.m./p.m.): _______________ Notes: ___________________________________ Insulin type: ______________________________ Amount (dose): _______________ Time (a.m./p.m.): _______________  Notes: ___________________________________ Managing blood glucose  Check your blood glucose levels using a glucose monitor as told by your health care provider. Write down the times that you check your glucose levels here: Time: _______________ Notes: ___________________________________ Time: _______________ Notes: ___________________________________ Time: _______________ Notes: ___________________________________ Time: _______________ Notes: ___________________________________ Time: _______________ Notes: ___________________________________ Time: _______________ Notes: ___________________________________    Low blood glucose Low blood glucose (hypoglycemia) is when glucose is at or below 70 mg/dL (3.9 mmol/L). Symptoms may include: Feeling: Hungry. Sweaty and clammy. Irritable or easily upset. Dizzy. Sleepy. Having: A fast heartbeat. A headache. A change in your vision. Numbness around the mouth, lips, or tongue. Having trouble with: Moving (coordination). Sleeping. Treating low blood glucose To treat low blood glucose, eat or drink something containing sugar right away. If you can think clearly and swallow safely, follow the 15:15 rule: Take 15 grams of a fast-acting carb (carbohydrate), as told by your health care provider. Some fast-acting carbs are: Glucose tablets: take 3-4 tablets. Hard candy: eat 3-5 pieces. Fruit juice: drink 4 oz (120 mL). Regular (not diet) soda: drink 4-6 oz (120-180 mL). Honey or sugar: eat 1 Tbsp (15 mL). Check your blood glucose levels 15 minutes after you take the carb. If your glucose is still at or below 70 mg/dL (3.9 mmol/L), take 15 grams of a carb again. If your glucose does not go above 70 mg/dL (3.9 mmol/L) after 3 tries, get help right away. After your glucose goes back to normal, eat a meal or a snack within 1 hour. Treating very low blood glucose If your glucose is at or below 54 mg/dL (3 mmol/L), you have very low blood glucose  (severe hypoglycemia). This is an emergency. Do not wait to see if the symptoms will go away. Get medical help right away. Call your local emergency services (911 in the [place] Do not drive yourself to the hospital. Questions to ask your health care provider Should I talk with a diabetes educator? What equipment will I need to care for myself at home? What diabetes medicines do I need? When should I take them? How often do I need to check my blood glucose levels? What number can I call if I have questions? When is my follow-up visit? Where can I find a support group for people with diabetes? Where to find more information American Diabetes Association: www.diabetes.org Association of Diabetes Care and Education Specialists: www.diabeteseducator.org Contact a health care provider if: Your blood glucose is at or above 240 mg/dL (13.3 mmol/L) for 2 days in a row. You have been sick or have had a fever for 2 days or more, and you are not getting better. You have any of these problems for more than 6 hours: You cannot eat or drink. You feel nauseous. You vomit. You have diarrhea. Get help right away if: Your blood glucose is lower than 54 mg/dL (3 mmol/L). You get confused. You have trouble thinking clearly. You have trouble breathing. These symptoms may represent a serious problem that is an emergency. Do not wait to see if the symptoms will go away. Get medical help right away. Call your local emergency services (911 in the [place] Do not drive yourself to the hospital. Summary Diabetes mellitus is a chronic disease that occurs when the body does not properly use sugar (glucose) that is released from food after you eat. Take insulin and diabetes medicines as told. Check your blood glucose every day, as often as told. Keep all follow-up visits. This is important. This information is not intended to replace advice given to you by your health care provider. Make sure you discuss any  questions you have with your health care provider. Document Revised: 05/30/2019 Document Reviewed: 05/30/2019 Elsevier Patient Education  2024 Elsevier Inc.  

## 2022-08-10 ENCOUNTER — Encounter: Payer: Self-pay | Admitting: Nurse Practitioner

## 2022-08-10 ENCOUNTER — Ambulatory Visit (INDEPENDENT_AMBULATORY_CARE_PROVIDER_SITE_OTHER): Payer: Self-pay | Admitting: Nurse Practitioner

## 2022-08-10 VITALS — BP 110/71 | HR 67 | Ht 65.0 in | Wt 144.4 lb

## 2022-08-10 DIAGNOSIS — E1169 Type 2 diabetes mellitus with other specified complication: Secondary | ICD-10-CM

## 2022-08-10 DIAGNOSIS — Z8673 Personal history of transient ischemic attack (TIA), and cerebral infarction without residual deficits: Secondary | ICD-10-CM

## 2022-08-10 DIAGNOSIS — Z1159 Encounter for screening for other viral diseases: Secondary | ICD-10-CM

## 2022-08-10 DIAGNOSIS — E1149 Type 2 diabetes mellitus with other diabetic neurological complication: Secondary | ICD-10-CM

## 2022-08-10 DIAGNOSIS — R590 Localized enlarged lymph nodes: Secondary | ICD-10-CM

## 2022-08-10 DIAGNOSIS — N400 Enlarged prostate without lower urinary tract symptoms: Secondary | ICD-10-CM

## 2022-08-10 DIAGNOSIS — D862 Sarcoidosis of lung with sarcoidosis of lymph nodes: Secondary | ICD-10-CM

## 2022-08-10 DIAGNOSIS — E559 Vitamin D deficiency, unspecified: Secondary | ICD-10-CM

## 2022-08-10 DIAGNOSIS — Z23 Encounter for immunization: Secondary | ICD-10-CM

## 2022-08-10 DIAGNOSIS — I152 Hypertension secondary to endocrine disorders: Secondary | ICD-10-CM

## 2022-08-10 DIAGNOSIS — E785 Hyperlipidemia, unspecified: Secondary | ICD-10-CM

## 2022-08-10 DIAGNOSIS — I7410 Embolism and thrombosis of unspecified parts of aorta: Secondary | ICD-10-CM

## 2022-08-10 DIAGNOSIS — E1159 Type 2 diabetes mellitus with other circulatory complications: Secondary | ICD-10-CM

## 2022-08-10 LAB — BAYER DCA HB A1C WAIVED: HB A1C (BAYER DCA - WAIVED): 6.4 % — ABNORMAL HIGH (ref 4.8–5.6)

## 2022-08-10 MED ORDER — GLUCOSE BLOOD VI STRP
ORAL_STRIP | 12 refills | Status: DC
Start: 2022-08-10 — End: 2023-09-21

## 2022-08-10 NOTE — Progress Notes (Signed)
BP 110/71   Pulse 67   Ht 5\' 5"  (1.651 m)   Wt 144 lb 6.4 oz (65.5 kg)   SpO2 98%   BMI 24.03 kg/m    Subjective:    Patient ID: Malik Jones, male    DOB: 1960-04-17, 62 y.o.   MRN: [id number]  HPI: Malik Jones is a 62 y.o. male  Chief Complaint  Patient presents with   Diabetes    Patient declines having a recent Diabetic Eye Exam. Patient says his eye provider no longer takes his insurance card. Patient says he was informed he would be only covered for one visit per year.    Hyperlipidemia   Hypertension   DIABETES Continues on Metformin.  Last A1c 6.1% in February. Continues Vitamin D supplement for history of low levels. Hypoglycemic episodes:no Polydipsia/polyuria: no Visual disturbance: no Chest pain: no Paresthesias: no Glucose Monitoring: yes  Accucheck frequency: two times weeks  Fasting glucose: 99 to 102  Post prandial:  Evening:  Before meals: Taking Insulin?: no  Long acting insulin:  Short acting insulin: Blood Pressure Monitoring: weekly Retinal Examination: Not up to Date -- insurance is not covered at [employer], will need to find alternative Foot Exam: Up to Date Pneumovax: Up to Date Influenza: Not up to Date Aspirin: yes   HYPERTENSION / HYPERLIPIDEMIA Continues on Valsartan-HCTZ, Amlodipine, ASA, Lipitor, Zetia. Follows with Dr. Dorris Fetch with cardiothoracic surgery every year for aortic thrombus, sarcoidosis, and mediastinal adenopathy - plan for repeat CT in one year. Denies any tobacco smoking and has occasional beer -- every other day.    Has history of cerebellar stroke 09/11/20 -- follows with neurology, last visit 12/18/21.  Satisfied with current treatment? yes Duration of hypertension: chronic BP monitoring frequency: twice a week BP range: 110-120/70-80 BP medication side effects: no Duration of hyperlipidemia: chronic Cholesterol medication side effects: no Cholesterol supplements: none Medication compliance: good  compliance Aspirin: yes Recent stressors: no Recurrent headaches: no Visual changes: no Palpitations: no Dyspnea: no Chest pain: no Lower extremity edema: no Dizzy/lightheaded: no  The ASCVD Risk score (Arnett DK, et al., 2019) failed to calculate for the following reasons:   The patient has a prior MI or stroke diagnosis  Relevant past medical, surgical, family and social history reviewed and updated as indicated. Interim medical history since our last visit reviewed. Allergies and medications reviewed and updated.  Review of Systems  Constitutional:  Negative for activity change, diaphoresis, fatigue and fever.  Respiratory:  Negative for cough, chest tightness, shortness of breath and wheezing.   Cardiovascular:  Negative for chest pain, palpitations and leg swelling.  Gastrointestinal: Negative.   Endocrine: Negative for polydipsia, polyphagia and polyuria.  Neurological: Negative.   Psychiatric/Behavioral: Negative.      Per HPI unless specifically indicated above     Objective:    BP 110/71   Pulse 67   Ht 5\' 5"  (1.651 m)   Wt 144 lb 6.4 oz (65.5 kg)   SpO2 98%   BMI 24.03 kg/m   Wt Readings from Last 3 Encounters:  08/10/22 144 lb 6.4 oz (65.5 kg)  05/08/22 142 lb 3.2 oz (64.5 kg)  04/09/22 140 lb 4.8 oz (63.6 kg)    Physical Exam Vitals and nursing note reviewed.  Constitutional:      General: He is awake. He is not in acute distress.    Appearance: He is well-developed and well-groomed. He is not ill-appearing or toxic-appearing.  HENT:     Head: Normocephalic.  Mouth/Throat:     Dentition: Abnormal dentition (poor dentition).  Eyes:     General: Lids are normal.     Extraocular Movements: Extraocular movements intact.     Conjunctiva/sclera: Conjunctivae normal.  Neck:     Thyroid: No thyromegaly.     Vascular: No carotid bruit.  Cardiovascular:     Rate and Rhythm: Normal rate and regular rhythm.     Heart sounds: Normal heart sounds.   Pulmonary:     Effort: No accessory muscle usage or respiratory distress.     Breath sounds: Normal breath sounds.  Abdominal:     General: Bowel sounds are normal. There is no distension.     Palpations: Abdomen is soft.     Tenderness: There is no abdominal tenderness.  Musculoskeletal:     Cervical back: Full passive range of motion without pain.     Right lower leg: No edema.     Left lower leg: No edema.  Lymphadenopathy:     Cervical: No cervical adenopathy.  Skin:    General: Skin is warm.     Capillary Refill: Capillary refill takes less than 2 seconds.  Neurological:     Mental Status: He is alert and oriented to person, place, and time.     Deep Tendon Reflexes: Reflexes are normal and symmetric.     Reflex Scores:      Brachioradialis reflexes are 2+ on the right side and 2+ on the left side.      Patellar reflexes are 2+ on the right side and 2+ on the left side. Psychiatric:        Attention and Perception: Attention normal.        Mood and Affect: Mood normal.        Speech: Speech normal.        Behavior: Behavior normal. Behavior is cooperative.        Thought Content: Thought content normal.    Diabetic Foot Exam - Simple   Simple Foot Form Visual Inspection No deformities, no ulcerations, no other skin breakdown bilaterally: Yes Sensation Testing Intact to touch and monofilament testing bilaterally: Yes Pulse Check Posterior Tibialis and Dorsalis pulse intact bilaterally: Yes Comments     Results for orders placed or performed in visit on 05/08/22  Basic metabolic panel  Result Value Ref Range   Glucose 120 (H) 70 - 99 mg/dL   BUN 12 6 - 24 mg/dL   Creatinine, Ser 1.61 0.76 - 1.27 mg/dL   eGFR 85 >09 UE/AVW/0.98   BUN/Creatinine Ratio 12 9 - 20   Sodium 139 134 - 144 mmol/L   Potassium 4.2 3.5 - 5.2 mmol/L   Chloride 100 96 - 106 mmol/L   CO2 22 20 - 29 mmol/L   Calcium 10.4 (H) 8.7 - 10.2 mg/dL      Assessment & Plan:   Problem List  Items Addressed This Visit       Cardiovascular and Mediastinum   Aortic thrombus (HCC)    Chronic, ongoing.  Continue collaboration with Dr. Dorris Fetch, cardiothoracic surgery.  Recent notes reviewed.      Hypertension associated with type 2 diabetes mellitus (HCC)    Chronic, stable.  Urine ALB 80 February 2024 -- Valsartan offering kidney protection.  Continue Valsartan-HCTZ and Amlodipine at current dosing.  Recommend she monitor BP at least a few mornings a week at home and document.  DASH diet at home.  Labs today: CMP and TSH.  Return in 6 months.  Relevant Orders   Bayer DCA Hb A1c Waived   Comprehensive metabolic panel   TSH   Mediastinal lymphadenopathy    Chronic, stable.  Continue collaboration with Dr. Dorris Fetch, cardiothoracic surgery.  Due for rescan in September 2024.        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  Continue collaboration with Dr. Dorris Fetch, cardiothoracic surgery.          Endocrine   Hyperlipidemia associated with type 2 diabetes mellitus (HCC)    Chronic, ongoing.  Continue current medication regimen and adjust as needed.  Lipid panel today.         Relevant Orders   Bayer DCA Hb A1c Waived   Comprehensive metabolic panel   Lipid Panel w/o Chol/HDL Ratio   Type 2 diabetes mellitus with neurological complications (HCC) - Primary    Chronic, ongoing.  A1c 6.4% today, stable. Urine ALB 80 February 2024 -- continue Valsartan for kidney protection.  Recommend he check blood sugar daily and document for provider visits.  Continue current medication regimen and adjust as needed.  Diabetic diet at home.  Recheck A1c in 6 months. - Foot exam up to date - Eye exam ordered last visit, has not attended as of yet - Vaccines up to date - Foot exam up to date.      Relevant Orders   Bayer DCA Hb A1c Waived     Other   History of cerebellar stroke    On 09/18/20, followed by neurology.   Will work on goals for stroke prevention -- A1c <6.5%, LDL <55, and BP <130/80.  Discussed with patient.  Recent neurology noted reviewed.      Vitamin D deficiency    Chronic, ongoing.  Continue supplement and adjust as needed.  Check levels today.      Relevant Orders   VITAMIN D 25 Hydroxy (Vit-D Deficiency, Fractures)   Other Visit Diagnoses     Benign prostatic hyperplasia without lower urinary tract symptoms       PSA on labs today for annual check.   Relevant Orders   PSA   Need for hepatitis C screening test       Hep C screen on labs today per guidelines for one time screening, discussed with patient.   Relevant Orders   Hepatitis C antibody        Follow up plan: Return in about 6 months (around 02/10/2023) for T2DM, HTN/HLD, Aortic thrombus.

## 2022-08-10 NOTE — Assessment & Plan Note (Signed)
Chronic, ongoing.  Continue supplement and adjust as needed.  Check levels today.

## 2022-08-10 NOTE — Assessment & Plan Note (Signed)
Chronic, ongoing.  A1c 6.4% today, stable. Urine ALB 80 February 2024 -- continue Valsartan for kidney protection.  Recommend he check blood sugar daily and document for provider visits.  Continue current medication regimen and adjust as needed.  Diabetic diet at home.  Recheck A1c in 6 months. - Foot exam up to date - Eye exam ordered last visit, has not attended as of yet - Vaccines up to date - Foot exam up to date.

## 2022-08-10 NOTE — Assessment & Plan Note (Signed)
Chronic, ongoing.  Continue collaboration with Dr. Hendrickson, cardiothoracic surgery.  Recent notes reviewed. 

## 2022-08-10 NOTE — Assessment & Plan Note (Signed)
Chronic, ongoing.  Continue collaboration with Dr. Hendrickson, cardiothoracic surgery.   

## 2022-08-10 NOTE — Assessment & Plan Note (Signed)
Chronic, ongoing.  Continue current medication regimen and adjust as needed. Lipid panel today. 

## 2022-08-10 NOTE — Assessment & Plan Note (Signed)
On 09/18/20, followed by neurology.  Will work on goals for stroke prevention -- A1c <6.5%, LDL <55, and BP <130/80.  Discussed with patient.  Recent neurology noted reviewed. 

## 2022-08-10 NOTE — Assessment & Plan Note (Signed)
Chronic, stable.  Urine ALB 80 February 2024 -- Valsartan offering kidney protection.  Continue Valsartan-HCTZ and Amlodipine at current dosing.  Recommend she monitor BP at least a few mornings a week at home and document.  DASH diet at home.  Labs today: CMP and TSH.  Return in 6 months.

## 2022-08-10 NOTE — Assessment & Plan Note (Signed)
Chronic, stable.  Continue collaboration with Dr. Hendrickson, cardiothoracic surgery.  Due for rescan in September 2024. 

## 2022-08-11 LAB — COMPREHENSIVE METABOLIC PANEL
ALT: 30 IU/L (ref 0–44)
AST: 26 IU/L (ref 0–40)
Albumin: 4.7 g/dL (ref 3.8–4.9)
Alkaline Phosphatase: 52 IU/L (ref 44–121)
BUN/Creatinine Ratio: 13 (ref 9–20)
BUN: 12 mg/dL (ref 6–24)
Bilirubin Total: 0.4 mg/dL (ref 0.0–1.2)
CO2: 23 mmol/L (ref 20–29)
Calcium: 9.6 mg/dL (ref 8.7–10.2)
Chloride: 101 mmol/L (ref 96–106)
Creatinine, Ser: 0.93 mg/dL (ref 0.76–1.27)
Globulin, Total: 2.3 g/dL (ref 1.5–4.5)
Glucose: 147 mg/dL — ABNORMAL HIGH (ref 70–99)
Potassium: 4.2 mmol/L (ref 3.5–5.2)
Sodium: 137 mmol/L (ref 134–144)
Total Protein: 7 g/dL (ref 6.0–8.5)
eGFR: 95 mL/min/{1.73_m2} (ref >59)

## 2022-08-11 LAB — LIPID PANEL W/O CHOL/HDL RATIO
Cholesterol, Total: 123 mg/dL (ref 100–199)
HDL: 52 mg/dL (ref >39)
LDL Chol Calc (NIH): 56 mg/dL (ref 0–99)
Triglycerides: 77 mg/dL (ref 0–149)
VLDL Cholesterol Cal: 15 mg/dL (ref 5–40)

## 2022-08-11 LAB — HEPATITIS C ANTIBODY: Hep C Virus Ab: NONREACTIVE

## 2022-08-11 LAB — TSH: TSH: 3.37 u[IU]/mL (ref 0.450–4.500)

## 2022-08-11 LAB — VITAMIN D 25 HYDROXY (VIT D DEFICIENCY, FRACTURES): Vit D, 25-Hydroxy: 62.4 ng/mL (ref 30.0–100.0)

## 2022-08-11 LAB — PSA: Prostate Specific Ag, Serum: 0.8 ng/mL (ref 0.0–4.0)

## 2022-08-11 NOTE — Progress Notes (Signed)
Contacted via MyChart   Good morning Malik Jones, your labs have returned: - Kidney function, creatinine and eGFR, remains normal, as is liver function, AST and ALT.  - Thyroid and prostate labs are normal:) - Vitamin D much improved, continue supplement. - Cholesterol levels at goal. - Hep C screening is negative.  Overall GREAT JOB!!  Any questions? Keep being amazing!!  Thank you for allowing me to participate in your care.  I appreciate you. Kindest regards, Lawrence Roldan

## 2022-09-22 ENCOUNTER — Other Ambulatory Visit: Payer: Self-pay | Admitting: Thoracic Surgery (Cardiothoracic Vascular Surgery)

## 2022-09-22 DIAGNOSIS — R590 Localized enlarged lymph nodes: Secondary | ICD-10-CM

## 2022-10-08 ENCOUNTER — Ambulatory Visit: Payer: Medicaid Other | Admitting: Nurse Practitioner

## 2022-10-15 ENCOUNTER — Telehealth: Payer: Self-pay | Admitting: Nurse Practitioner

## 2022-10-15 NOTE — Telephone Encounter (Signed)
Routing to referral coordinator.

## 2022-10-15 NOTE — Telephone Encounter (Signed)
Amber with [facility] has called in regards to a referral they received for patient. Per [facility], their office does not accept patients insurance and the referral needs to be sent somewhere else. Please advise.

## 2022-10-20 ENCOUNTER — Encounter: Payer: Self-pay | Admitting: Thoracic Surgery (Cardiothoracic Vascular Surgery)

## 2022-10-20 ENCOUNTER — Ambulatory Visit (INDEPENDENT_AMBULATORY_CARE_PROVIDER_SITE_OTHER): Payer: Self-pay | Admitting: Thoracic Surgery (Cardiothoracic Vascular Surgery)

## 2022-10-20 ENCOUNTER — Ambulatory Visit
Admission: RE | Admit: 2022-10-20 | Discharge: 2022-10-20 | Disposition: A | Discharge status: Home / Self Care | Payer: Medicaid Other | Source: Ambulatory Visit | Attending: Thoracic Surgery (Cardiothoracic Vascular Surgery) | Admitting: Thoracic Surgery (Cardiothoracic Vascular Surgery)

## 2022-10-20 VITALS — BP 150/77 | HR 67 | Resp 20 | Ht 65.0 in | Wt 141.0 lb

## 2022-10-20 DIAGNOSIS — R590 Localized enlarged lymph nodes: Secondary | ICD-10-CM

## 2022-10-20 DIAGNOSIS — I7410 Embolism and thrombosis of unspecified parts of aorta: Secondary | ICD-10-CM

## 2022-10-20 NOTE — Progress Notes (Signed)
[address]       Malik Jones [address]             [phone]     HPI: Malik Jones returns for a scheduled follow-up visit regarding an anterior mediastinal nodule and mediastinal adenopathy.  Malik Jones is a 62 year old man with a history of hypertension, hyperlipidemia, stroke, ascending aortic thrombus, diabetes, mediastinal adenopathy, pulmonary sarcoidosis, and an anterior mediastinal nodule.  He presented in August 2022 with a stroke.  CT of the head and neck showed an ascending aortic thrombus.  He was treated with anticoagulation.  A repeat CT a couple days later showed no residual thrombus.  However he was noted to have nodularity in both lungs, mediastinal adenopathy, and an anterior mediastinal nodule.  I have followed him for that.  I last saw him in was able to walk but fatigues easily.  In the interim since his last visit he says he feels about the same.  Still gets heat sensations very rapidly on his left side.  That has been going on since his stroke.  Short of breath with exertion but that is stable.  No double vision or difficulty swallowing.  Past Medical History:  Diagnosis Date   Diabetes mellitus without complication (HCC)    Hyperlipidemia    Hypertension    Hyponatremia    Medical history non-contributory    Primary hypertension 11/27/2020   Stroke [facility])     Current Outpatient Medications  Medication Sig Dispense Refill   amLODipine (NORVASC) 10 MG tablet Take 1 tablet (10 mg total) by mouth daily. 90 tablet 4   aspirin EC 81 MG tablet Take 1 tablet (81 mg total) by mouth daily. Swallow whole. 30 tablet 11   atorvastatin (LIPITOR) 80 MG tablet Take 1 tablet (80 mg total) by mouth daily. 90 tablet 4   ezetimibe (ZETIA) 10 MG tablet Take 1 tablet (10 mg total) by mouth daily. 90 tablet 4   glucose blood test strip Use as instructed 100 each 12   metFORMIN (GLUCOPHAGE) 500 MG tablet Take 1 tablet (500 mg total) by mouth daily with  breakfast. .need 90 tablet 4   valsartan-hydrochlorothiazide (DIOVAN-HCT) 80-12.5 MG tablet Take 1 tablet by mouth daily. 90 tablet 4   No current facility-administered medications for this visit.    Physical Exam BP (!) 150/77   Pulse 67   Resp 20   Ht 5\' 5"  (1.651 m)   Wt 141 lb (64 kg)   SpO2 97% Comment: RA  BMI 23.51 kg/m  62 year old man in no acute distress Alert and oriented x 3.  No gross focal motor deficit Cardiac regular rate and rhythm with no audible murmur Lungs faint wheezing at right base but otherwise clear  Diagnostic Tests: Official CT reading pending.  I personally reviewed the CT images.  There is no significant change in the anterior mediastinal nodule or the mediastinal lymph nodes.  Some improvement in the bilateral pulmonary changes.  Coronary and aortic atherosclerosis.  Impression: Malik Jones is a 62 year old man with a history of hypertension, hyperlipidemia, stroke, ascending aortic thrombus, diabetes, mediastinal adenopathy, pulmonary sarcoidosis, and an anterior mediastinal nodule.  Ascending aortic thrombus with stroke in 2022-resolved with anticoagulation.  Pulmonary nodularity with known pulmonary sarcoidosis.  Stable to improved.  Mediastinal adenopathy-stable.  Also likely sarcoidosis.  Anterior mediastinal nodule-stable.  Could be sarcoidosis related.  A slow-growing thymoma and thymic hyperplasia are also in the differential.   Plan: Return in  1 year with CT chest to follow-up adenopathy and anterior mediastinal nodule.  I spent over 20 minutes in review of records, images, and in consultation with Malik Jones at today Malik Slot, MD [facility] [phone]

## 2023-02-15 ENCOUNTER — Ambulatory Visit: Payer: Medicaid Other | Admitting: Nurse Practitioner

## 2023-03-13 DIAGNOSIS — E119 Type 2 diabetes mellitus without complications: Secondary | ICD-10-CM | POA: Insufficient documentation

## 2023-03-13 NOTE — Patient Instructions (Signed)

## 2023-03-15 ENCOUNTER — Telehealth: Payer: Self-pay | Admitting: Nurse Practitioner

## 2023-03-15 ENCOUNTER — Encounter: Payer: Self-pay | Admitting: Nurse Practitioner

## 2023-03-15 ENCOUNTER — Ambulatory Visit (INDEPENDENT_AMBULATORY_CARE_PROVIDER_SITE_OTHER): Payer: Medicare Other | Admitting: Nurse Practitioner

## 2023-03-15 VITALS — BP 116/71 | HR 62 | Temp 97.7°F | Ht 65.0 in | Wt 150.4 lb

## 2023-03-15 DIAGNOSIS — E1149 Type 2 diabetes mellitus with other diabetic neurological complication: Secondary | ICD-10-CM

## 2023-03-15 DIAGNOSIS — D862 Sarcoidosis of lung with sarcoidosis of lymph nodes: Secondary | ICD-10-CM

## 2023-03-15 DIAGNOSIS — E1159 Type 2 diabetes mellitus with other circulatory complications: Secondary | ICD-10-CM

## 2023-03-15 DIAGNOSIS — Z23 Encounter for immunization: Secondary | ICD-10-CM | POA: Diagnosis not present

## 2023-03-15 DIAGNOSIS — E559 Vitamin D deficiency, unspecified: Secondary | ICD-10-CM | POA: Diagnosis not present

## 2023-03-15 DIAGNOSIS — E119 Type 2 diabetes mellitus without complications: Secondary | ICD-10-CM | POA: Diagnosis not present

## 2023-03-15 DIAGNOSIS — Z8673 Personal history of transient ischemic attack (TIA), and cerebral infarction without residual deficits: Secondary | ICD-10-CM

## 2023-03-15 DIAGNOSIS — I7410 Embolism and thrombosis of unspecified parts of aorta: Secondary | ICD-10-CM

## 2023-03-15 DIAGNOSIS — I152 Hypertension secondary to endocrine disorders: Secondary | ICD-10-CM | POA: Diagnosis not present

## 2023-03-15 DIAGNOSIS — E1169 Type 2 diabetes mellitus with other specified complication: Secondary | ICD-10-CM

## 2023-03-15 DIAGNOSIS — Z7984 Long term (current) use of oral hypoglycemic drugs: Secondary | ICD-10-CM

## 2023-03-15 DIAGNOSIS — E785 Hyperlipidemia, unspecified: Secondary | ICD-10-CM | POA: Diagnosis not present

## 2023-03-15 LAB — MICROALBUMIN, URINE WAIVED
Creatinine, Urine Waived: 300 mg/dL (ref 10–300)
Microalb, Ur Waived: 30 mg/L — ABNORMAL HIGH (ref 0–19)
Microalb/Creat Ratio: <30 mg/g (ref <30)

## 2023-03-15 LAB — BAYER DCA HB A1C WAIVED: HB A1C (BAYER DCA - WAIVED): 6.3 % — ABNORMAL HIGH (ref 4.8–5.6)

## 2023-03-15 MED ORDER — AMLODIPINE BESYLATE 10 MG PO TABS: 10.0000 mg | ORAL_TABLET | Freq: Every day | ORAL | 4 refills | Status: AC

## 2023-03-15 MED ORDER — EZETIMIBE 10 MG PO TABS: 10.0000 mg | ORAL_TABLET | Freq: Every day | ORAL | 4 refills | Status: AC

## 2023-03-15 MED ORDER — ONETOUCH ULTRA VI STRP: 1.0000 | ORAL_STRIP | Freq: Every day | 3 refills | Status: AC

## 2023-03-15 MED ORDER — VALSARTAN-HYDROCHLOROTHIAZIDE 80-12.5 MG PO TABS: 1.0000 | ORAL_TABLET | Freq: Every day | ORAL | 4 refills | Status: AC

## 2023-03-15 MED ORDER — METFORMIN HCL 500 MG PO TABS: 500.0000 mg | ORAL_TABLET | Freq: Every day | ORAL | 4 refills | Status: AC

## 2023-03-15 MED ORDER — ATORVASTATIN CALCIUM 80 MG PO TABS: 80.0000 mg | ORAL_TABLET | Freq: Every day | ORAL | 4 refills | Status: AC

## 2023-03-15 MED ORDER — ONETOUCH ULTRA 2 W/DEVICE KIT: 1.0000 | PACK | Freq: Every day | 0 refills | Status: AC

## 2023-03-15 MED ORDER — ONETOUCH ULTRASOFT LANCETS MISC: 1.0000 | Freq: Every day | 3 refills | Status: AC

## 2023-03-15 NOTE — Assessment & Plan Note (Signed)
Chronic, ongoing.  A1c 6.3% today, stable. Urine ALB 30 February 2025 -- continue Valsartan for kidney protection.  Recommend he check blood sugar daily and document for provider visits.  Continue current medication regimen and adjust as needed.  Diabetic diet at home.  Recheck A1c in 6 months. - Foot exam up to date - Eye exam ordered last visit, has not attended as of yet - Vaccines up to date - Foot exam up to date.

## 2023-03-15 NOTE — Assessment & Plan Note (Signed)
Chronic, ongoing.  Continue supplement and adjust as needed.  Check levels today.

## 2023-03-15 NOTE — Assessment & Plan Note (Signed)
Chronic, stable.  Urine ALB 30 February 2025 -- Valsartan offering kidney protection.  Continue Valsartan-HCTZ and Amlodipine at current dosing.  Recommend she monitor BP at least a few mornings a week at home and document.  DASH diet at home.  Labs today: CMP.  Return in 6 months.

## 2023-03-15 NOTE — Assessment & Plan Note (Signed)
Chronic, ongoing.  Continue collaboration with Dr. Hendrickson, cardiothoracic surgery.   

## 2023-03-15 NOTE — Telephone Encounter (Signed)
Orders signed and sent.

## 2023-03-15 NOTE — Assessment & Plan Note (Signed)
Refer to diabetes with neurological issues plan of care.

## 2023-03-15 NOTE — Assessment & Plan Note (Signed)
 Chronic, ongoing.  Continue current medication regimen and adjust as needed. Lipid panel today.

## 2023-03-15 NOTE — Telephone Encounter (Signed)
Pt is calling in because he needs a new prescription for a diabetic test kit to test his sugar. He also needs lancets and test strips sent over as well. [facility] on [address] is the pharmacy.

## 2023-03-15 NOTE — Telephone Encounter (Signed)
Orders t'd up. Routing to provider for approval.

## 2023-03-15 NOTE — Progress Notes (Signed)
BP 116/71   Pulse 62   Temp 97.7 F (36.5 C) (Oral)   Ht 5\' 5"  (1.651 m)   Wt 150 lb 6.4 oz (68.2 kg)   SpO2 96%   BMI 25.03 kg/m    Subjective:    Patient ID: Malik Jones, male    DOB: May 16, 1960, 63 y.o.   MRN: [id number]  HPI: Malik Jones is a 63 y.o. male  Chief Complaint  Patient presents with   Diabetes   Hyperlipidemia   Hypertension   DIABETES Taking Metformin.  A1c in July 2024 was 6.4%.  Continues on Vitamin D for history of low levels. Hypoglycemic episodes:no Polydipsia/polyuria: no Visual disturbance: no Chest pain: no Paresthesias: no Glucose Monitoring: yes  Accucheck frequency: occasionally  Fasting glucose: 100 range  Post prandial:  Evening:  Before meals: Taking Insulin?: no  Long acting insulin:  Short acting insulin: Blood Pressure Monitoring: weekly Retinal Examination: Not up to Date -- going to make an appointment Foot Exam: Up to Date Pneumovax: Up to Date Influenza: Up To Date Aspirin: yes   HYPERTENSION / HYPERLIPIDEMIA Taking Valsartan-HCTZ, Amlodipine, ASA, Lipitor, Zetia. Follows with Dr. Dorris Fetch, cardiothoracic surgery, yearly for aortic thrombus, sarcoidosis, and mediastinal adenopathy.  Last seen 10/20/22. 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 Satisfied with current treatment? yes Duration of hypertension: chronic BP monitoring frequency: occasional BP range: 110/70 range BP medication side effects: no Duration of hyperlipidemia: chronic Cholesterol medication side effects: no Cholesterol supplements: none Medication compliance: good compliance Aspirin: yes Recent stressors: no Recurrent headaches: no Visual changes: no Palpitations: no Dyspnea: no Chest pain: no Lower extremity edema: no Dizzy/lightheaded: no  The ASCVD Risk score (Arnett DK, et al., 2019) failed to calculate for the following reasons:   Risk score cannot be calculated because patient has a medical  history suggesting prior/existing ASCVD  Relevant past medical, surgical, family and social history reviewed and updated as indicated. Interim medical history since our last visit reviewed. Allergies and medications reviewed and updated.  Review of Systems  Constitutional:  Negative for activity change, diaphoresis, fatigue and fever.  Respiratory:  Negative for cough, chest tightness, shortness of breath and wheezing.   Cardiovascular:  Negative for chest pain, palpitations and leg swelling.  Gastrointestinal: Negative.   Endocrine: Negative for polydipsia, polyphagia and polyuria.  Neurological: Negative.   Psychiatric/Behavioral: Negative.      Per HPI unless specifically indicated above     Objective:    BP 116/71   Pulse 62   Temp 97.7 F (36.5 C) (Oral)   Ht 5\' 5"  (1.651 m)   Wt 150 lb 6.4 oz (68.2 kg)   SpO2 96%   BMI 25.03 kg/m   Wt Readings from Last 3 Encounters:  03/15/23 150 lb 6.4 oz (68.2 kg)  10/20/22 141 lb (64 kg)  08/10/22 144 lb 6.4 oz (65.5 kg)    Physical Exam Vitals and nursing note reviewed.  Constitutional:      General: He is awake. He is not in acute distress.    Appearance: He is well-developed and well-groomed. He is not ill-appearing or toxic-appearing.  HENT:     Head: Normocephalic.     Mouth/Throat:     Dentition: Abnormal dentition (poor dentition).  Eyes:     General: Lids are normal.     Extraocular Movements: Extraocular movements intact.     Conjunctiva/sclera: Conjunctivae normal.  Neck:     Thyroid: No thyromegaly.     Vascular: No  carotid bruit.  Cardiovascular:     Rate and Rhythm: Normal rate and regular rhythm.     Heart sounds: Normal heart sounds.  Pulmonary:     Effort: No accessory muscle usage or respiratory distress.     Breath sounds: Normal breath sounds.  Abdominal:     General: Bowel sounds are normal. There is no distension.     Palpations: Abdomen is soft.     Tenderness: There is no abdominal  tenderness.  Musculoskeletal:     Cervical back: Full passive range of motion without pain.     Right lower leg: No edema.     Left lower leg: No edema.  Lymphadenopathy:     Cervical: No cervical adenopathy.  Skin:    General: Skin is warm.     Capillary Refill: Capillary refill takes less than 2 seconds.  Neurological:     Mental Status: He is alert and oriented to person, place, and time.     Deep Tendon Reflexes: Reflexes are normal and symmetric.     Reflex Scores:      Brachioradialis reflexes are 2+ on the right side and 2+ on the left side.      Patellar reflexes are 2+ on the right side and 2+ on the left side. Psychiatric:        Attention and Perception: Attention normal.        Mood and Affect: Mood normal.        Speech: Speech normal.        Behavior: Behavior normal. Behavior is cooperative.        Thought Content: Thought content normal.    Results for orders placed or performed in visit on 08/10/22  Bayer DCA Hb A1c Waived   Collection Time: 08/10/22 10:45 AM  Result Value Ref Range   HB A1C (BAYER DCA - WAIVED) 6.4 (H) 4.8 - 5.6 %  Comprehensive metabolic panel   Collection Time: 08/10/22 10:47 AM  Result Value Ref Range   Glucose 147 (H) 70 - 99 mg/dL   BUN 12 6 - 24 mg/dL   Creatinine, Ser 0.86 0.76 - 1.27 mg/dL   eGFR 95 >57 QI/ONG/2.95   BUN/Creatinine Ratio 13 9 - 20   Sodium 137 134 - 144 mmol/L   Potassium 4.2 3.5 - 5.2 mmol/L   Chloride 101 96 - 106 mmol/L   CO2 23 20 - 29 mmol/L   Calcium 9.6 8.7 - 10.2 mg/dL   Total Protein 7.0 6.0 - 8.5 g/dL   Albumin 4.7 3.8 - 4.9 g/dL   Globulin, Total 2.3 1.5 - 4.5 g/dL   Bilirubin Total 0.4 0.0 - 1.2 mg/dL   Alkaline Phosphatase 52 44 - 121 IU/L   AST 26 0 - 40 IU/L   ALT 30 0 - 44 IU/L  Lipid Panel w/o Chol/HDL Ratio   Collection Time: 08/10/22 10:47 AM  Result Value Ref Range   Cholesterol, Total 123 100 - 199 mg/dL   Triglycerides 77 0 - 149 mg/dL   HDL 52 >28 mg/dL   VLDL Cholesterol Cal 15 5  - 40 mg/dL   LDL Chol Calc (NIH) 56 0 - 99 mg/dL  TSH   Collection Time: 08/10/22 10:47 AM  Result Value Ref Range   TSH 3.370 0.450 - 4.500 uIU/mL  PSA   Collection Time: 08/10/22 10:47 AM  Result Value Ref Range   Prostate Specific Ag, Serum 0.8 0.0 - 4.0 ng/mL  VITAMIN D 25 Hydroxy (Vit-D Deficiency, Fractures)  Collection Time: 08/10/22 10:47 AM  Result Value Ref Range   Vit D, 25-Hydroxy 62.4 30.0 - 100.0 ng/mL  Hepatitis C antibody   Collection Time: 08/10/22 10:47 AM  Result Value Ref Range   Hep C Virus Ab Non Reactive Non Reactive      Assessment & Plan:   Problem List Items Addressed This Visit       Cardiovascular and Mediastinum   Aortic thrombus (HCC)   Chronic, ongoing.  Continue collaboration with Dr. Dorris Fetch, cardiothoracic surgery.  Recent notes reviewed.      Relevant Medications   atorvastatin (LIPITOR) 80 MG tablet   amLODipine (NORVASC) 10 MG tablet   ezetimibe (ZETIA) 10 MG tablet   valsartan-hydrochlorothiazide (DIOVAN-HCT) 80-12.5 MG tablet   Hypertension associated with type 2 diabetes mellitus (HCC)   Chronic, stable.  Urine ALB 30 February 2025 -- Valsartan offering kidney protection.  Continue Valsartan-HCTZ and Amlodipine at current dosing.  Recommend she monitor BP at least a few mornings a week at home and document.  DASH diet at home.  Labs today: CMP.  Return in 6 months.       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  Continue collaboration with Dr. Dorris Fetch, cardiothoracic surgery.          Endocrine   Diabetes mellitus treated  with oral medication (HCC)   Refer to diabetes with neurological issues plan of care.      Relevant Medications   atorvastatin (LIPITOR) 80 MG tablet   metFORMIN (GLUCOPHAGE) 500 MG tablet   valsartan-hydrochlorothiazide (DIOVAN-HCT) 80-12.5 MG tablet   Other Relevant Orders   Bayer DCA Hb A1c Waived   Microalbumin, Urine Waived   Hyperlipidemia associated with type 2 diabetes mellitus (HCC)   Chronic, ongoing.  Continue current medication regimen and adjust as needed.  Lipid panel today.         Relevant Medications   atorvastatin (LIPITOR) 80 MG tablet   amLODipine (NORVASC) 10 MG tablet   ezetimibe (ZETIA) 10 MG tablet   metFORMIN (GLUCOPHAGE) 500 MG tablet   valsartan-hydrochlorothiazide (DIOVAN-HCT) 80-12.5 MG tablet   Other Relevant Orders   Bayer DCA Hb A1c Waived   Comprehensive metabolic panel   Lipid Panel w/o Chol/HDL Ratio   Type 2 diabetes mellitus with neurological complications (HCC) - Primary   Chronic, ongoing.  A1c 6.3% today, stable. Urine ALB 30 February 2025 -- continue Valsartan for kidney protection.  Recommend he check blood sugar daily and document for provider visits.  Continue current medication regimen and adjust as needed.  Diabetic diet at home.  Recheck A1c in 6 months. - Foot exam up to date - Eye exam ordered last visit, has not attended as of yet - Vaccines up to date - Foot exam up to date.      Relevant Medications   atorvastatin (LIPITOR) 80 MG tablet   metFORMIN (GLUCOPHAGE) 500 MG tablet   valsartan-hydrochlorothiazide (DIOVAN-HCT) 80-12.5 MG tablet   Other Relevant Orders   Bayer DCA Hb A1c Waived   Microalbumin, Urine Waived     Other  History of cerebellar stroke   On 09/18/20, followed by neurology.  Will work on goals for stroke prevention -- A1c <6.5%, LDL <55, and BP <130/80.  Discussed with patient.  Recent neurology noted reviewed.      Vitamin D deficiency   Chronic, ongoing.  Continue supplement and adjust as needed.   Check levels today.      Relevant Orders   VITAMIN D 25 Hydroxy (Vit-D Deficiency, Fractures)   Other Visit Diagnoses       Need for influenza vaccination       Flu vaccine today, educated on this.   Relevant Orders   Flu vaccine trivalent PF, 6mos and older(Flulaval,Afluria,Fluarix,Fluzone) (Completed)        Follow up plan: Return in about 6 months (around 09/12/2023) for T2DM, HTN/HLD + needs Welcome to Medicare in 40 minute slot.

## 2023-03-15 NOTE — Assessment & Plan Note (Signed)
Chronic, stable.  Continue collaboration with Dr. Dorris Fetch, cardiothoracic surgery.

## 2023-03-15 NOTE — Assessment & Plan Note (Signed)
On 09/18/20, followed by neurology.  Will work on goals for stroke prevention -- A1c <6.5%, LDL <55, and BP <130/80.  Discussed with patient.  Recent neurology noted reviewed. 

## 2023-03-15 NOTE — Assessment & Plan Note (Signed)
Chronic, ongoing.  Continue collaboration with Dr. Hendrickson, cardiothoracic surgery.  Recent notes reviewed. 

## 2023-03-16 ENCOUNTER — Encounter: Payer: Self-pay | Admitting: Nurse Practitioner

## 2023-03-16 LAB — COMPREHENSIVE METABOLIC PANEL
ALT: 22 [IU]/L (ref 0–44)
AST: 20 [IU]/L (ref 0–40)
Albumin: 4.6 g/dL (ref 3.8–4.9)
Alkaline Phosphatase: 50 [IU]/L (ref 44–121)
BUN/Creatinine Ratio: 13 (ref 10–24)
BUN: 14 mg/dL (ref 8–27)
Bilirubin Total: 0.5 mg/dL (ref 0.0–1.2)
CO2: 23 mmol/L (ref 20–29)
Calcium: 10 mg/dL (ref 8.6–10.2)
Chloride: 102 mmol/L (ref 96–106)
Creatinine, Ser: 1.06 mg/dL (ref 0.76–1.27)
Globulin, Total: 2.6 g/dL (ref 1.5–4.5)
Glucose: 107 mg/dL — ABNORMAL HIGH (ref 70–99)
Potassium: 4.4 mmol/L (ref 3.5–5.2)
Sodium: 141 mmol/L (ref 134–144)
Total Protein: 7.2 g/dL (ref 6.0–8.5)
eGFR: 80 mL/min/{1.73_m2} (ref >59)

## 2023-03-16 LAB — LIPID PANEL W/O CHOL/HDL RATIO
Cholesterol, Total: 104 mg/dL (ref 100–199)
HDL: 34 mg/dL — ABNORMAL LOW (ref >39)
LDL Chol Calc (NIH): 55 mg/dL (ref 0–99)
Triglycerides: 69 mg/dL (ref 0–149)
VLDL Cholesterol Cal: 15 mg/dL (ref 5–40)

## 2023-03-16 LAB — VITAMIN D 25 HYDROXY (VIT D DEFICIENCY, FRACTURES): Vit D, 25-Hydroxy: 101 ng/mL — ABNORMAL HIGH (ref 30.0–100.0)

## 2023-03-16 NOTE — Progress Notes (Signed)
Contacted via MyChart   Good morning Malik Jones, your labs have returned and overall are stable: - Kidney function, creatinine and eGFR, remains normal, as is liver function, AST and ALT.  - Lipid panel has goal levels. - Vitamin D a little too high, if taking any Vitamin D supplements please cut back to only a few days a week on these.  Any questions? Keep being amazing!!  Thank you for allowing me to participate in your care.  I appreciate you. Kindest regards, Rylei Masella

## 2023-03-28 NOTE — Patient Instructions (Incomplete)
 Be Involved in Caring For Your Health:  Taking Medications When medications are taken as directed, they can greatly improve your health. But if they are not taken as prescribed, they may not work. In some cases, not taking them correctly can be harmful. To help ensure your treatment remains effective and safe, understand your medications and how to take them. Bring your medications to each visit for review by your provider.  Your lab results, notes, and after visit summary will be available on My Chart. We strongly encourage you to use this feature. If lab results are abnormal the clinic will contact you with the appropriate steps. If the clinic does not contact you assume the results are satisfactory. You can always view your results on My Chart. If you have questions regarding your health or results, please contact the clinic during office hours. You can also ask questions on My Chart.  We at [facility] are grateful that you chose [place] to provide your care. We strive to provide evidence-based and compassionate care and are always looking for feedback. If you get a survey from the clinic please complete this so we can hear your opinions.  Heart-Healthy Eating Plan Many factors influence your heart health, including eating and exercise habits. Heart health is also called coronary health. Coronary risk increases with abnormal blood fat (lipid) levels. A heart-healthy eating plan includes limiting unhealthy fats, increasing healthy fats, limiting salt (sodium) intake, and making other diet and lifestyle changes. What is my plan? Your health care provider may recommend that: You limit your fat intake to _________% or less of your total calories each day. You limit your saturated fat intake to _________% or less of your total calories each day. You limit the amount of cholesterol in your diet to less than _________ mg per day. You limit the amount of sodium in your diet to less than _________  mg per day. What are tips for following this plan? Cooking Cook foods using methods other than frying. Baking, boiling, grilling, and broiling are all good options. Other ways to reduce fat include: Removing the skin from poultry. Removing all visible fats from meats. Steaming vegetables in water or broth. Meal planning  At meals, imagine dividing your plate into fourths: Fill one-half of your plate with vegetables and green salads. Fill one-fourth of your plate with whole grains. Fill one-fourth of your plate with lean protein foods. Eat 2-4 cups of vegetables per day. One cup of vegetables equals 1 cup (91 g) broccoli or cauliflower florets, 2 medium carrots, 1 large bell pepper, 1 large sweet potato, 1 large tomato, 1 medium white potato, 2 cups (150 g) raw leafy greens. Eat 1-2 cups of fruit per day. One cup of fruit equals 1 small apple, 1 large banana, 1 cup (237 g) mixed fruit, 1 large orange,  cup (82 g) dried fruit, 1 cup (240 mL) 100% fruit juice. Eat more foods that contain soluble fiber. Examples include apples, broccoli, carrots, beans, peas, and barley. Aim to get 25-30 g of fiber per day. Increase your consumption of legumes, nuts, and seeds to 4-5 servings per week. One serving of dried beans or legumes equals  cup (90 g) cooked, 1 serving of nuts is  oz (12 almonds, 24 pistachios, or 7 walnut halves), and 1 serving of seeds equals  oz (8 g). Fats Choose healthy fats more often. Choose monounsaturated and polyunsaturated fats, such as olive and canola oils, avocado oil, flaxseeds, walnuts, almonds, and seeds. Eat  more omega-3 fats. Choose salmon, mackerel, sardines, tuna, flaxseed oil, and ground flaxseeds. Aim to eat fish at least 2 times each week. Check food labels carefully to identify foods with trans fats or high amounts of saturated fat. Limit saturated fats. These are found in animal products, such as meats, butter, and cream. Plant sources of saturated fats  include palm oil, palm kernel oil, and coconut oil. Avoid foods with partially hydrogenated oils in them. These contain trans fats. Examples are stick margarine, some tub margarines, cookies, crackers, and other baked goods. Avoid fried foods. General information Eat more home-cooked food and less restaurant, buffet, and fast food. Limit or avoid alcohol. Limit foods that are high in added sugar and simple starches such as foods made using white refined flour (white breads, pastries, sweets). Lose weight if you are overweight. Losing just 5-10% of your body weight can help your overall health and prevent diseases such as diabetes and heart disease. Monitor your sodium intake, especially if you have high blood pressure. Talk with your health care provider about your sodium intake. Try to incorporate more vegetarian meals weekly. What foods should I eat? Fruits All fresh, canned (in natural juice), or frozen fruits. Vegetables Fresh or frozen vegetables (raw, steamed, roasted, or grilled). Green salads. Grains Most grains. Choose whole wheat and whole grains most of the time. Rice and pasta, including brown rice and pastas made with whole wheat. Meats and other proteins Lean, well-trimmed beef, veal, pork, and lamb. Chicken and [place] without skin. All fish and shellfish. Wild duck, rabbit, pheasant, and venison. Egg whites or low-cholesterol egg substitutes. Dried beans, peas, lentils, and tofu. Seeds and most nuts. Dairy Low-fat or nonfat cheeses, including ricotta and mozzarella. Skim or 1% milk (liquid, powdered, or evaporated). Buttermilk made with low-fat milk. Nonfat or low-fat yogurt. Fats and oils Non-hydrogenated (trans-free) margarines. Vegetable oils, including soybean, sesame, sunflower, olive, avocado, peanut, safflower, corn, canola, and cottonseed. Salad dressings or mayonnaise made with a vegetable oil. Beverages Water (mineral or sparkling). Coffee and tea. Unsweetened ice  tea. Diet beverages. Sweets and desserts Sherbet, gelatin, and fruit ice. Small amounts of dark chocolate. Limit all sweets and desserts. Seasonings and condiments All seasonings and condiments. The items listed above may not be a complete list of foods and beverages you can eat. Contact a dietitian for more options. What foods should I avoid? Fruits Canned fruit in heavy syrup. Fruit in cream or butter sauce. Fried fruit. Limit coconut. Vegetables Vegetables cooked in cheese, cream, or butter sauce. Fried vegetables. Grains Breads made with saturated or trans fats, oils, or whole milk. Croissants. Sweet rolls. Donuts. High-fat crackers, such as cheese crackers and chips. Meats and other proteins Fatty meats, such as hot dogs, ribs, sausage, bacon, rib-eye roast or steak. High-fat deli meats, such as salami and bologna. Caviar. Domestic duck and goose. Organ meats, such as liver. Dairy Cream, sour cream, cream cheese, and creamed cottage cheese. Whole-milk cheeses. Whole or 2% milk (liquid, evaporated, or condensed). Whole buttermilk. Cream sauce or high-fat cheese sauce. Whole-milk yogurt. Fats and oils Meat fat, or shortening. Cocoa butter, hydrogenated oils, palm oil, coconut oil, palm kernel oil. Solid fats and shortenings, including bacon fat, salt pork, lard, and butter. Nondairy cream substitutes. Salad dressings with cheese or sour cream. Beverages Regular sodas and any drinks with added sugar. Sweets and desserts Frosting. Pudding. Cookies. Cakes. Pies. Milk chocolate or white chocolate. Buttered syrups. Full-fat ice cream or ice cream drinks. The items listed above may  not be a complete list of foods and beverages to avoid. Contact a dietitian for more information. Summary Heart-healthy meal planning includes limiting unhealthy fats, increasing healthy fats, limiting salt (sodium) intake and making other diet and lifestyle changes. Lose weight if you are overweight. Losing just  5-10% of your body weight can help your overall health and prevent diseases such as diabetes and heart disease. Focus on eating a balance of foods, including fruits and vegetables, low-fat or nonfat dairy, lean protein, nuts and legumes, whole grains, and heart-healthy oils and fats. This information is not intended to replace advice given to you by your health care provider. Make sure you discuss any questions you have with your health care provider. Document Revised: 03/03/2021 Document Reviewed: 03/03/2021 Elsevier Patient Education  2024 [employer].

## 2023-03-31 ENCOUNTER — Encounter: Payer: Medicare Other | Admitting: Nurse Practitioner

## 2023-04-02 DIAGNOSIS — H40003 Preglaucoma, unspecified, bilateral: Secondary | ICD-10-CM | POA: Diagnosis not present

## 2023-04-02 DIAGNOSIS — E119 Type 2 diabetes mellitus without complications: Secondary | ICD-10-CM | POA: Diagnosis not present

## 2023-04-02 DIAGNOSIS — H2513 Age-related nuclear cataract, bilateral: Secondary | ICD-10-CM | POA: Diagnosis not present

## 2023-04-02 LAB — HM DIABETES EYE EXAM

## 2023-04-04 NOTE — Patient Instructions (Signed)

## 2023-04-07 ENCOUNTER — Ambulatory Visit (INDEPENDENT_AMBULATORY_CARE_PROVIDER_SITE_OTHER): Payer: Medicare Other | Admitting: Nurse Practitioner

## 2023-04-07 ENCOUNTER — Encounter: Payer: Self-pay | Admitting: Nurse Practitioner

## 2023-04-07 VITALS — BP 112/65 | HR 66 | Temp 98.0°F | Ht 65.1 in | Wt 152.6 lb

## 2023-04-07 DIAGNOSIS — E1159 Type 2 diabetes mellitus with other circulatory complications: Secondary | ICD-10-CM

## 2023-04-07 DIAGNOSIS — E1149 Type 2 diabetes mellitus with other diabetic neurological complication: Secondary | ICD-10-CM | POA: Diagnosis not present

## 2023-04-07 DIAGNOSIS — Z0000 Encounter for general adult medical examination without abnormal findings: Secondary | ICD-10-CM

## 2023-04-07 DIAGNOSIS — I152 Hypertension secondary to endocrine disorders: Secondary | ICD-10-CM | POA: Diagnosis not present

## 2023-04-07 NOTE — Assessment & Plan Note (Signed)
 Chronic, stable.  Urine ALB 30 February 2025 -- Valsartan offering kidney protection.  Continue Valsartan-HCTZ and Amlodipine at current dosing.  Recommend he monitor BP at least a few mornings a week at home and document.  DASH diet at home.  Labs today: up to date.

## 2023-04-07 NOTE — Progress Notes (Signed)
 Subjective:    Malik Jones is a 63 y.o. male who presents for a Welcome to Medicare exam.   Cardiac Risk Factors include: diabetes mellitus;hypertension     Objective:    Today's Vitals   04/07/23 1357 04/07/23 1400  BP: 112/65   Pulse: 66   Temp: 98 F (36.7 C)   TempSrc: Oral   SpO2: 98%   Weight: 152 lb 9.6 oz (69.2 kg)   Height: 5' 5.1" (1.654 m)   PainSc: 0-No pain 0-No pain   Body mass index is 25.32 kg/m.  Medications Outpatient Encounter Medications as of 04/07/2023  Medication Sig   amLODipine (NORVASC) 10 MG tablet Take 1 tablet (10 mg total) by mouth daily.   atorvastatin (LIPITOR) 80 MG tablet Take 1 tablet (80 mg total) by mouth daily.   Blood Glucose Monitoring Suppl (ONE TOUCH ULTRA 2) w/Device KIT 1 each by Does not apply route daily at 2 PM.   ezetimibe (ZETIA) 10 MG tablet Take 1 tablet (10 mg total) by mouth daily.   glucose blood (ONETOUCH ULTRA) test strip 1 each by Other route daily at 2 PM. Use as instructed   glucose blood test strip Use as instructed   Lancets (ONETOUCH ULTRASOFT) lancets 1 each by Other route daily at 2 PM. Use as instructed   metFORMIN (GLUCOPHAGE) 500 MG tablet Take 1 tablet (500 mg total) by mouth daily with breakfast. .need   valsartan-hydrochlorothiazide (DIOVAN-HCT) 80-12.5 MG tablet Take 1 tablet by mouth daily.   No facility-administered encounter medications on file as of 04/07/2023.     History: Past Medical History:  Diagnosis Date   Diabetes mellitus without complication (HCC)    Hyperlipidemia    Hypertension    Hyponatremia    Medical history non-contributory    Primary hypertension 11/27/2020   Stroke [facility])    Past Surgical History:  Procedure Laterality Date   MANDIBLE FRACTURE SURGERY     NO PAST SURGERIES      Family History  Problem Relation Age of Onset   Kidney disease Mother    Hip fracture Father    Social History   Occupational History   Not on file  Tobacco Use   Smoking status:  Never   Smokeless tobacco: Never  Vaping Use   Vaping status: Never Used  Substance and Sexual Activity   Alcohol use: Not Currently    Alcohol/week: 6.0 standard drinks of alcohol    Types: 6 Cans of beer per week   Drug use: Not Currently    Types: Marijuana   Sexual activity: Not Currently    Tobacco Counseling Counseling given: Not Answered  Immunizations and Health Maintenance Immunization History  Administered Date(s) Administered   Influenza, Seasonal, Injecte, Preservative Fre 03/15/2023   Influenza,inj,Quad PF,6+ Mos 01/27/2021   Moderna Sars-Covid-2 Vaccination 05/03/2019, 05/31/2019, 12/17/2020   Pneumococcal Polysaccharide-23 09/20/2020   Health Maintenance Due  Topic Date Due   OPHTHALMOLOGY EXAM  Never done    Activities of Daily Living    04/07/2023    2:02 PM  In your present state of health, do you have any difficulty performing the following activities:  Hearing? 0  Vision? 0  Difficulty concentrating or making decisions? 0  Walking or climbing stairs? 0  Dressing or bathing? 0  Doing errands, shopping? 0  Preparing Food and eating ? N  Using the Toilet? N  In the past six months, have you accidently leaked urine? N  Do you have problems with loss  of bowel control? N  Managing your Medications? N  Managing your Finances? N  Housekeeping or managing your Housekeeping? N    Physical Exam Vitals and nursing note reviewed.  Constitutional:      General: He is awake. He is not in acute distress.    Appearance: He is well-developed and well-groomed. He is not ill-appearing or toxic-appearing.  HENT:     Head: Normocephalic and atraumatic.     Right Ear: Hearing, tympanic membrane, ear canal and external ear normal. No drainage.     Left Ear: Hearing, tympanic membrane, ear canal and external ear normal. No drainage.     Nose: Nose normal.     Mouth/Throat:     Pharynx: Uvula midline.  Eyes:     General: Lids are normal.        Right eye: No  discharge.        Left eye: No discharge.     Extraocular Movements: Extraocular movements intact.     Conjunctiva/sclera: Conjunctivae normal.     Pupils: Pupils are equal, round, and reactive to light.     Visual Fields: Right eye visual fields normal and left eye visual fields normal.  Neck:     Thyroid: No thyromegaly.     Vascular: No carotid bruit or JVD.     Trachea: Trachea normal.  Cardiovascular:     Rate and Rhythm: Normal rate and regular rhythm.     Heart sounds: Normal heart sounds, S1 normal and S2 normal. No murmur heard.    No gallop.  Pulmonary:     Effort: Pulmonary effort is normal. No accessory muscle usage or respiratory distress.     Breath sounds: Normal breath sounds.  Abdominal:     General: Bowel sounds are normal.     Palpations: Abdomen is soft. There is no hepatomegaly or splenomegaly.     Tenderness: There is no abdominal tenderness.  Musculoskeletal:        General: Normal range of motion.     Cervical back: Normal range of motion and neck supple.     Right lower leg: No edema.     Left lower leg: No edema.  Lymphadenopathy:     Head:     Right side of head: No submental, submandibular, tonsillar, preauricular or posterior auricular adenopathy.     Left side of head: No submental, submandibular, tonsillar, preauricular or posterior auricular adenopathy.     Cervical: No cervical adenopathy.  Skin:    General: Skin is warm and dry.     Capillary Refill: Capillary refill takes less than 2 seconds.     Findings: No rash.  Neurological:     Mental Status: He is alert and oriented to person, place, and time.     Gait: Gait is intact.     Deep Tendon Reflexes: Reflexes are normal and symmetric.     Reflex Scores:      Brachioradialis reflexes are 2+ on the right side and 2+ on the left side.      Patellar reflexes are 2+ on the right side and 2+ on the left side. Psychiatric:        Attention and Perception: Attention normal.        Mood and  Affect: Mood normal.        Speech: Speech normal.        Behavior: Behavior normal. Behavior is cooperative.        Thought Content: Thought content normal.  Cognition and Memory: Cognition normal.    Advanced Directives: Does Patient Have a Medical Advance Directive?: No Would patient like information on creating a medical advance directive?: Yes (MAU/Ambulatory/Procedural Areas - Information given)   EKG:  normal EKG, normal sinus rhythm, unchanged from previous tracings, normal sinus rhythm     Assessment:    This is a routine wellness  examination for this patient . Welcome To Medicare Visit.  Vision/Hearing screen Vision Screening   Right eye Left eye Both eyes  Without correction 20/200 20/200 20/200  With correction 20/20 20/20 20/20      Goals   None     Depression Screen    04/07/2023    2:09 PM 03/15/2023    9:48 AM 08/10/2022   10:47 AM 05/08/2022    8:52 AM  PHQ 2/9 Scores  PHQ - 2 Score 0 0 3 0  PHQ- 9 Score 0 0 3 0    Fall Risk    04/07/2023    2:08 PM  Fall Risk   Falls in the past year? 0  Number falls in past yr: 0  Injury with Fall? 0  Risk for fall due to : No Fall Risks  Follow up Falls evaluation completed    Cognitive Function        04/07/2023    2:10 PM  6CIT Screen  What Year? 0 points  What month? 0 points  What time? 0 points  Count back from 20 0 points  Months in reverse 2 points  Repeat phrase 4 points  Total Score 6 points    Patient Care Team: Marjie Skiff, NP as PCP - General (Nurse Practitioner)     Plan:   Overall healthy male on exam.  Continue current treatment regimen.  He plans on exercising at the gym -- water aerobics and light weights.  I have personally reviewed and noted the following in the patient's chart:   Medical and social history Use of alcohol, tobacco or illicit drugs  Current medications and supplements Functional ability and status Nutritional status Physical activity Advanced  directives List of other physicians Hospitalizations, surgeries, and ER visits in previous 12 months Vitals Screenings to include cognitive, depression, and falls Referrals and appointments  In addition, I have reviewed and discussed with patient certain preventive protocols, quality metrics, and best practice recommendations. A written personalized care plan for preventive services as well as general preventive health recommendations were provided to patient.

## 2023-04-07 NOTE — Assessment & Plan Note (Signed)
 Chronic, ongoing.  A1c 6.3% last visit, stable. Urine ALB 30 February 2025 -- continue Valsartan for kidney protection.  Recommend he check blood sugar daily and document for provider visits.  Continue current medication regimen and adjust as needed.  Diabetic diet at home.  Recheck A1c in 6 months. - Foot exam up to date - Eye exam ordered last visit, has not attended as of yet - Vaccines up to date - Foot exam up to date.

## 2023-04-12 ENCOUNTER — Telehealth: Payer: Self-pay

## 2023-04-12 NOTE — Telephone Encounter (Signed)
 Copied from CRM [id number]. Topic: Clinical - Medication Question >> Apr 09, 2023  1:18 PM Hamdi H wrote: Reason for CRM: [facility] called needing a prior auth for heplisav-b medication. Best call back number for them is: [phone]

## 2023-04-12 NOTE — Telephone Encounter (Signed)
 Confirmed with [facility] this was for a vaccine done at pharmacy. No needs here for our office.

## 2023-07-05 ENCOUNTER — Other Ambulatory Visit: Payer: Self-pay | Admitting: Nurse Practitioner

## 2023-07-08 NOTE — Telephone Encounter (Signed)
 Refilled 03/15/2023 #[id number] rf at same pharmacy. Requested Prescriptions  Refused Prescriptions Disp Refills   valsartan -hydrochlorothiazide  (DIOVAN -HCT) 80-12.5 MG tablet [Pharmacy Med Name: Valsartan -hydroCHLOROthiazide  80-12.5 MG Oral Tablet] 30 tablet 0    Sig: Take 1 tablet by mouth once daily     Cardiovascular: ARB + Diuretic Combos Passed - 07/08/2023 12:51 PM      Passed - K in normal range and within 180 days    Potassium  Date Value Ref Range Status  03/15/2023 4.4 3.5 - 5.2 mmol/L Final         Passed - Na in normal range and within 180 days    Sodium  Date Value Ref Range Status  03/15/2023 141 134 - 144 mmol/L Final         Passed - Cr in normal range and within 180 days    Creatinine, Ser  Date Value Ref Range Status  03/15/2023 1.06 0.76 - 1.27 mg/dL Final         Passed - eGFR is 10 or above and within 180 days    GFR, Estimated  Date Value Ref Range Status  09/25/2020 >60 >60 mL/min Final    Comment:    (NOTE) Calculated using the CKD-EPI Creatinine Equation (2021)    eGFR  Date Value Ref Range Status  03/15/2023 80 >59 mL/min/1.73 Final         Passed - Patient is not pregnant      Passed - Last BP in normal range    BP Readings from Last 1 Encounters:  04/07/23 112/65         Passed - Valid encounter within last 6 months    Recent Outpatient Visits           3 months ago Welcome to [employer] preventive visit   [facility] [place], Galena T, NP   3 months ago 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

## 2023-09-18 NOTE — Patient Instructions (Signed)
Be Involved in Caring For Your Health:  Taking Medications When medications are taken as directed, they can greatly improve your health. But if they are not taken as prescribed, they may not work. In some cases, not taking them correctly can be harmful. To help ensure your treatment remains effective and safe, understand your medications and how to take them. Bring your medications to each visit for review by your provider.  Your lab results, notes, and after visit summary will be available on My Chart. We strongly encourage you to use this feature. If lab results are abnormal the clinic will contact you with the appropriate steps. If the clinic does not contact you assume the results are satisfactory. You can always view your results on My Chart. If you have questions regarding your health or results, please contact the clinic during office hours. You can also ask questions on My Chart.  We at [facility] are grateful that you chose [place] to provide your care. We strive to provide evidence-based and compassionate care and are always looking for feedback. If you get a survey from the clinic please complete this so we can hear your opinions.  Diabetes Mellitus and Exercise Regular exercise is important for your health, especially if you have diabetes mellitus. Exercise is not just about losing weight. It can also help you increase muscle strength and bone density and reduce body fat and stress. This can help your level of endurance and make you more fit and flexible. Why should I exercise if I have diabetes? Exercise has many benefits for people with diabetes. It can: Help lower and control your blood sugar (glucose). Help your body respond better and become more sensitive to the hormone insulin. Reduce how much insulin your body needs. Lower your risk for heart disease by: Lowering how much "bad" cholesterol and triglycerides you have in your body. Increasing how much "good" cholesterol  you have in your body. Lowering your blood pressure. Lowering your blood glucose levels. What is my activity plan? Your health care provider or an expert trained in diabetes care (certified diabetes educator) can help you make an activity plan. This plan can help you find the type of exercise that works for you. It may also tell you how often to exercise and for how long. Be sure to: Get at least 150 minutes of medium-intensity or high-intensity exercise each week. This may involve brisk walking, biking, or water aerobics. Do stretching and strengthening exercises at least 2 times a week. This may involve yoga or weight lifting. Spread out your activity over at least 3 days of the week. Get some form of physical activity each day. Do not go more than 2 days in a row without some kind of activity. Avoid being inactive for more than 30 minutes at a time. Take frequent breaks to walk or stretch. Choose activities that you enjoy. Set goals that you know you can accomplish. Start slowly and increase the intensity of your exercise over time. How do I manage my diabetes during exercise?  Monitor your blood glucose Check your blood glucose before and after you exercise. If your blood glucose is 240 mg/dL (40.9 mmol/L) or higher before you exercise, check your urine for ketones. These are chemicals created by the liver. If you have ketones in your urine, do not exercise until your blood glucose returns to normal. If your blood glucose is 100 mg/dL (5.6 mmol/L) or lower, eat a snack that has 15-20 grams of carbohydrate in  it. Check your blood glucose 15 minutes after the snack to make sure that your level is above 100 mg/dL (5.6 mmol/L) before you start to exercise. Your risk for low blood glucose (hypoglycemia) goes up during and after exercise. Know the symptoms of this condition and how to treat it. Follow these instructions at home: Keep a carbohydrate snack on hand for use before, during, and after  exercise. This can help prevent or treat hypoglycemia. Avoid injecting insulin into parts of your body that are going to be used during exercise. This may include: Your arms, when you are going to play tennis. Your legs, when you are about to go jogging. Keep track of your exercise habits. This can help you and your health care provider watch and adjust your activity plan. Write down: What you eat before and after you exercise. Blood glucose levels before and after you exercise. The type and amount of exercise you do. Talk to your health care provider before you start a new activity. They may need to: Make sure that the activity is safe for you. Adjust your insulin, other medicines, and food that you eat. Drink water while you exercise. This can stop you from losing too much water (dehydration). It can also prevent problems caused by having a lot of heat in your body (heat stroke). Where to find more information American Diabetes Association: diabetes.org Association of Diabetes Care & Education Specialists: diabeteseducator.org This information is not intended to replace advice given to you by your health care provider. Make sure you discuss any questions you have with your health care provider. Document Revised: 07/16/2021 Document Reviewed: 07/16/2021 Elsevier Patient Education  2024 [employer].

## 2023-09-21 ENCOUNTER — Other Ambulatory Visit: Payer: Self-pay | Admitting: Thoracic Surgery (Cardiothoracic Vascular Surgery)

## 2023-09-21 ENCOUNTER — Encounter: Payer: Self-pay | Admitting: Nurse Practitioner

## 2023-09-21 ENCOUNTER — Ambulatory Visit: Payer: Medicare Other | Admitting: Nurse Practitioner

## 2023-09-21 VITALS — BP 123/76 | HR 59 | Temp 97.9°F | Ht 65.1 in | Wt 150.6 lb

## 2023-09-21 DIAGNOSIS — D862 Sarcoidosis of lung with sarcoidosis of lymph nodes: Secondary | ICD-10-CM | POA: Diagnosis not present

## 2023-09-21 DIAGNOSIS — Z0000 Encounter for general adult medical examination without abnormal findings: Secondary | ICD-10-CM | POA: Diagnosis not present

## 2023-09-21 DIAGNOSIS — E785 Hyperlipidemia, unspecified: Secondary | ICD-10-CM | POA: Diagnosis not present

## 2023-09-21 DIAGNOSIS — E119 Type 2 diabetes mellitus without complications: Secondary | ICD-10-CM | POA: Diagnosis not present

## 2023-09-21 DIAGNOSIS — Z7984 Long term (current) use of oral hypoglycemic drugs: Secondary | ICD-10-CM | POA: Diagnosis not present

## 2023-09-21 DIAGNOSIS — I7410 Embolism and thrombosis of unspecified parts of aorta: Secondary | ICD-10-CM | POA: Diagnosis not present

## 2023-09-21 DIAGNOSIS — E559 Vitamin D deficiency, unspecified: Secondary | ICD-10-CM

## 2023-09-21 DIAGNOSIS — E1149 Type 2 diabetes mellitus with other diabetic neurological complication: Secondary | ICD-10-CM | POA: Diagnosis not present

## 2023-09-21 DIAGNOSIS — I152 Hypertension secondary to endocrine disorders: Secondary | ICD-10-CM

## 2023-09-21 DIAGNOSIS — Z8673 Personal history of transient ischemic attack (TIA), and cerebral infarction without residual deficits: Secondary | ICD-10-CM

## 2023-09-21 DIAGNOSIS — E1159 Type 2 diabetes mellitus with other circulatory complications: Secondary | ICD-10-CM | POA: Diagnosis not present

## 2023-09-21 DIAGNOSIS — N400 Enlarged prostate without lower urinary tract symptoms: Secondary | ICD-10-CM

## 2023-09-21 DIAGNOSIS — E1169 Type 2 diabetes mellitus with other specified complication: Secondary | ICD-10-CM

## 2023-09-21 DIAGNOSIS — J9859 Other diseases of mediastinum, not elsewhere classified: Secondary | ICD-10-CM

## 2023-09-21 DIAGNOSIS — Z1211 Encounter for screening for malignant neoplasm of colon: Secondary | ICD-10-CM | POA: Diagnosis not present

## 2023-09-21 LAB — BAYER DCA HB A1C WAIVED: HB A1C (BAYER DCA - WAIVED): 6.4 % — ABNORMAL HIGH (ref 4.8–5.6)

## 2023-09-21 NOTE — Assessment & Plan Note (Signed)
 Chronic, ongoing.  Continue collaboration with Dr. Kerrin, cardiothoracic surgery.  Recent notes reviewed.

## 2023-09-21 NOTE — Assessment & Plan Note (Addendum)
 Chronic, ongoing.  A1c 6.4% today, stable. Urine ALB 30 February 2025 -- continue Valsartan  for kidney protection.  Recommend he check blood sugar daily and document for provider visits.  Continue current medication regimen and adjust as needed.  Diabetic diet at home.  Recheck A1c in 6 months. - Foot exam up to date - Eye exam up to date. - Vaccines up to date - ARB and statin on board

## 2023-09-21 NOTE — Assessment & Plan Note (Signed)
 Chronic, stable.  Urine ALB 30 February 2025 -- Valsartan  offering kidney protection.  Continue Valsartan -HCTZ and Amlodipine  at current dosing.  Recommend he monitor BP at least a few mornings a week at home and document.  DASH diet at home.  Labs today: CBC, CMP, TSH.

## 2023-09-21 NOTE — Assessment & Plan Note (Signed)
 Chronic, ongoing.  Continue current medication regimen and adjust as needed.  Lipid panel today.  Start Zetia  if elevations above goals for stroke prevention.

## 2023-09-21 NOTE — Assessment & Plan Note (Signed)
Chronic, ongoing.  Continue supplement and adjust as needed.  Check levels today.

## 2023-09-21 NOTE — Assessment & Plan Note (Addendum)
 On 09/18/20, followed by neurology in past.  Will work on goals for stroke prevention -- A1c <6.5%, LDL <55, and BP <130/80.  Discussed with patient.  Last neurology notes reviewed. Return to them as needed.

## 2023-09-21 NOTE — Progress Notes (Addendum)
 BP 123/76   Pulse (!) 59   Temp 97.9 F (36.6 C) (Oral)   Ht 5' 5.1 (1.654 m)   Wt 150 lb 9.6 oz (68.3 kg)   SpO2 97%   BMI 24.98 kg/m    Subjective:    Patient ID: Malik Jones, male    DOB: Sep 14, 1960, 63 y.o.   MRN: [id number]  HPI: Malik Jones is a 63 y.o. male presenting on 09/21/2023 for comprehensive medical examination. Current medical complaints include:none  He currently lives with: Interim Problems from his last visit: no  DIABETES A1c 6.3% February.  Taking Metformin .  Continues on Vitamin D  for history of low levels. Hypoglycemic episodes:no Polydipsia/polyuria: no Visual disturbance: no Chest pain: no Paresthesias: no Glucose Monitoring: yes  Accucheck frequency: occasionally  Fasting glucose: 110 to 130  Post prandial:  Evening:  Before meals: Taking Insulin ?: no  Long acting insulin :  Short acting insulin : Blood Pressure Monitoring: weekly Retinal Examination: Up To Date -- [place] [facility] Foot Exam: Up to Date Pneumovax: Up to Date Influenza: Up To Date Aspirin : yes   HYPERTENSION / HYPERLIPIDEMIA Continues Valsartan -HCTZ, Amlodipine , ASA, Lipitor. Has not been taking Zetia , never picked up. Follows with Dr. Kerrin, cardiothoracic surgery, yearly for aortic thrombus, sarcoidosis, and mediastinal adenopathy, last visit 10/20/22. 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 Still has some mild weakness on left hand side per patient. Satisfied with current treatment? yes Duration of hypertension: chronic BP monitoring frequency: occasional BP range: 110/70 range BP medication side effects: no Duration of hyperlipidemia: chronic Cholesterol medication side effects: no Cholesterol supplements: none Medication compliance: good compliance Aspirin : yes Recent stressors: no Recurrent headaches: no Visual changes: no Palpitations: no Dyspnea: no Chest pain: no Lower extremity edema: no Dizzy/lightheaded: no  The  ASCVD Risk score (Arnett DK, et al., 2019) failed to calculate for the following reasons:   Risk score cannot be calculated because patient has a medical history suggesting prior/existing ASCVD  Functional Status Survey: Is the patient deaf or have difficulty hearing?: No Does the patient have difficulty seeing, even when wearing glasses/contacts?: No Does the patient have difficulty concentrating, remembering, or making decisions?: No Does the patient have difficulty walking or climbing stairs?: No Does the patient have difficulty dressing or bathing?: No Does the patient have difficulty doing errands alone such as visiting a doctor's office or shopping?: No  FALL RISK:    09/21/2023    9:03 AM 04/07/2023    2:08 PM 03/15/2023    9:47 AM 08/10/2022   10:48 AM 05/08/2022    8:52 AM  Fall Risk   Falls in the past year? 0 0 0 0 0  Number falls in past yr: 0 0 0 0 0  Injury with Fall? 0 0 0 0 0  Risk for fall due to : No Fall Risks No Fall Risks No Fall Risks No Fall Risks No Fall Risks  Follow up Falls evaluation completed Falls evaluation completed Falls evaluation completed Falls evaluation completed Falls evaluation completed   Depression Screen    09/21/2023    9:03 AM 04/07/2023    2:09 PM 03/15/2023    9:48 AM 08/10/2022   10:47 AM 05/08/2022    8:52 AM  Depression screen PHQ 2/9  Decreased Interest 0 0 0 3 0  Down, Depressed, Hopeless 0 0 0 0 0  PHQ - 2 Score 0 0 0 3 0  Altered sleeping 0 0 0 0 0  Tired, decreased energy 0  0 0 0 0  Change in appetite 0 0 0 0 0  Feeling bad or failure about yourself  0 0 0 0 0  Trouble concentrating 0 0 0 0 0  Moving slowly or fidgety/restless 0 0 0 0 0  Suicidal thoughts 0 0 0 0 0  PHQ-9 Score 0 0 0 3 0  Difficult doing work/chores Not difficult at all Not difficult at all Not difficult at all Not difficult at all Not difficult at all      09/21/2023    9:04 AM 03/15/2023    9:48 AM 08/10/2022   10:48 AM 05/08/2022    8:53 AM  GAD 7 :  Generalized Anxiety Score  Nervous, Anxious, on Edge 0 0 0 0  Control/stop worrying 0 0 0 0  Worry too much - different things 0 0 0 0  Trouble relaxing 0 0 0 0  Restless 0 0 0 0  Easily annoyed or irritable 0 0 0 0  Afraid - awful might happen 0 0 0 0  Total GAD 7 Score 0 0 0 0  Anxiety Difficulty Not difficult at all Not difficult at all Not difficult at all Not difficult at all   Past Medical History:  Past Medical History:  Diagnosis Date   Diabetes mellitus without complication (HCC)    Hyperlipidemia    Hypertension    Hyponatremia    Medical history non-contributory    Primary hypertension 11/27/2020   Stroke [facility])     Surgical History:  Past Surgical History:  Procedure Laterality Date   MANDIBLE FRACTURE SURGERY     NO PAST SURGERIES      Medications:  Current Outpatient Medications on File Prior to Visit  Medication Sig   amLODipine  (NORVASC ) 10 MG tablet Take 1 tablet (10 mg total) by mouth daily.   atorvastatin  (LIPITOR) 80 MG tablet Take 1 tablet (80 mg total) by mouth daily.   Blood Glucose Monitoring Suppl (ONE TOUCH ULTRA 2) w/Device KIT 1 each by Does not apply route daily at 2 PM.   glucose blood (ONETOUCH ULTRA) test strip 1 each by Other route daily at 2 PM. Use as instructed   Lancets (ONETOUCH ULTRASOFT) lancets 1 each by Other route daily at 2 PM. Use as instructed   metFORMIN  (GLUCOPHAGE ) 500 MG tablet Take 1 tablet (500 mg total) by mouth daily with breakfast. .need   valsartan -hydrochlorothiazide  (DIOVAN -HCT) 80-12.5 MG tablet Take 1 tablet by mouth daily.   ezetimibe  (ZETIA ) 10 MG tablet Take 1 tablet (10 mg total) by mouth daily. (Patient not taking: Reported on 09/21/2023)   No current facility-administered medications on file prior to visit.    Allergies:  No Known Allergies  Social History:  Social History   Socioeconomic History   Marital status: Single    Spouse name: Not on file   Number of children: Not on file   Years of  education: Not on file   Highest education level: Not on file  Occupational History   Not on file  Tobacco Use   Smoking status: Never   Smokeless tobacco: Never  Vaping Use   Vaping status: Never Used  Substance and Sexual Activity   Alcohol use: Yes    Alcohol/week: 6.0 standard drinks of alcohol    Types: 6 Cans of beer per week    Comment: on occasion   Drug use: Not Currently    Types: Marijuana   Sexual activity: Not Currently  Other Topics Concern  Not on file  Social History Narrative   Not on file   Social Drivers of Health   Financial Resource Strain: Low Risk  (04/07/2023)   Overall Financial Resource Strain (CARDIA)    Difficulty of Paying Living Expenses: Not very hard  Food Insecurity: No Food Insecurity (04/07/2023)   Hunger Vital Sign    Worried About Running Out of Food in the Last Year: Never true    Ran Out of Food in the Last Year: Never true  Transportation Needs: No Transportation Needs (04/07/2023)   PRAPARE - Administrator, Civil Service (Medical): No    Lack of Transportation (Non-Medical): No  Physical Activity: Sufficiently Active (04/07/2023)   Exercise Vital Sign    Days of Exercise per Week: 5 days    Minutes of Exercise per Session: 30 min  Stress: No Stress Concern Present (04/07/2023)   [employer] of Occupational Health - Occupational Stress Questionnaire    Feeling of Stress : Not at all  Social Connections: Moderately Integrated (04/07/2023)   Social Connection and Isolation Panel    Frequency of Communication with Friends and Family: More than three times a week    Frequency of Social Gatherings with Friends and Family: More than three times a week    Attends Religious Services: More than 4 times per year    Active Member of [employer] or Organizations: No    Attends Banker Meetings: Never    Marital Status: Living with partner  Intimate Partner Violence: Not At Risk (04/07/2023)   Humiliation, Afraid, Rape,  and Kick questionnaire    Fear of Current or Ex-Partner: No    Emotionally Abused: No    Physically Abused: No    Sexually Abused: No   Social History   Tobacco Use  Smoking Status Never  Smokeless Tobacco Never   Social History   Substance and Sexual Activity  Alcohol Use Yes   Alcohol/week: 6.0 standard drinks of alcohol   Types: 6 Cans of beer per week   Comment: on occasion    Family History:  Family History  Problem Relation Age of Onset   Kidney disease Mother    Hip fracture Father     Past medical history, surgical history, medications, allergies, family history and social history reviewed with patient today and changes made to appropriate areas of the chart.   ROS All other ROS negative except what is listed above and in the HPI.      Objective:    BP 123/76   Pulse (!) 59   Temp 97.9 F (36.6 C) (Oral)   Ht 5' 5.1 (1.654 m)   Wt 150 lb 9.6 oz (68.3 kg)   SpO2 97%   BMI 24.98 kg/m   Wt Readings from Last 3 Encounters:  09/21/23 150 lb 9.6 oz (68.3 kg)  04/07/23 152 lb 9.6 oz (69.2 kg)  03/15/23 150 lb 6.4 oz (68.2 kg)    No results found.  Physical Exam Vitals and nursing note reviewed.  Constitutional:      General: He is awake. He is not in acute distress.    Appearance: He is well-developed and well-groomed. He is not ill-appearing or toxic-appearing.  HENT:     Head: Normocephalic and atraumatic.     Right Ear: Hearing, tympanic membrane, ear canal and external ear normal. No drainage.     Left Ear: Hearing, tympanic membrane, ear canal and external ear normal. No drainage.     Nose: Nose  normal.     Mouth/Throat:     Pharynx: Uvula midline.  Eyes:     General: Lids are normal.        Right eye: No discharge.        Left eye: No discharge.     Extraocular Movements: Extraocular movements intact.     Conjunctiva/sclera: Conjunctivae normal.     Pupils: Pupils are equal, round, and reactive to light.     Visual Fields: Right eye  visual fields normal and left eye visual fields normal.  Neck:     Thyroid: No thyromegaly.     Vascular: No carotid bruit or JVD.     Trachea: Trachea normal.  Cardiovascular:     Rate and Rhythm: Regular rhythm. Bradycardia present.     Heart sounds: S1 normal and S2 normal. Murmur heard.     Systolic murmur is present with a grade of 2/6.     No gallop.  Pulmonary:     Effort: Pulmonary effort is normal. No accessory muscle usage or respiratory distress.     Breath sounds: Normal breath sounds.  Abdominal:     General: Bowel sounds are normal.     Palpations: Abdomen is soft. There is no hepatomegaly or splenomegaly.     Tenderness: There is no abdominal tenderness.  Musculoskeletal:        General: Normal range of motion.     Cervical back: Normal range of motion and neck supple.     Right lower leg: No edema.     Left lower leg: No edema.  Lymphadenopathy:     Head:     Right side of head: No submental, submandibular, tonsillar, preauricular or posterior auricular adenopathy.     Left side of head: No submental, submandibular, tonsillar, preauricular or posterior auricular adenopathy.     Cervical: No cervical adenopathy.  Skin:    General: Skin is warm and dry.     Capillary Refill: Capillary refill takes less than 2 seconds.     Findings: No rash.  Neurological:     Mental Status: He is alert and oriented to person, place, and time.     Gait: Gait is intact.     Deep Tendon Reflexes: Reflexes are normal and symmetric.     Reflex Scores:      Brachioradialis reflexes are 2+ on the right side and 2+ on the left side.      Patellar reflexes are 2+ on the right side and 2+ on the left side. Psychiatric:        Attention and Perception: Attention normal.        Mood and Affect: Mood normal.        Speech: Speech normal.        Behavior: Behavior normal. Behavior is cooperative.        Thought Content: Thought content normal.        Cognition and Memory: Cognition normal.     Diabetic Foot Exam - Simple   Simple Foot Form Visual Inspection No deformities, no ulcerations, no other skin breakdown bilaterally: Yes Sensation Testing Intact to touch and monofilament testing bilaterally: Yes Pulse Check Posterior Tibialis and Dorsalis pulse intact bilaterally: Yes Comments        04/07/2023    2:10 PM  6CIT Screen  What Year? 0 points  What month? 0 points  What time? 0 points  Count back from 20 0 points  Months in reverse 2 points  Repeat phrase 4 points  Total Score 6 points   Results for orders placed or performed in visit on 04/08/23  HM DIABETES EYE EXAM   Collection Time: 04/02/23 11:31 AM  Result Value Ref Range   HM Diabetic Eye Exam No Retinopathy No Retinopathy      Assessment & Plan:   Problem List Items Addressed This Visit       Cardiovascular and Mediastinum   Hypertension associated with type 2 diabetes mellitus (HCC)   Chronic, stable.  Urine ALB 30 February 2025 -- Valsartan  offering kidney protection.  Continue Valsartan -HCTZ and Amlodipine  at current dosing.  Recommend he monitor BP at least a few mornings a week at home and document.  DASH diet at home.  Labs today: CBC, CMP, TSH.       Relevant Orders   Bayer DCA Hb A1c Waived   CBC with Differential/Platelet   TSH   Aortic thrombus (HCC)   Chronic, ongoing.  Continue collaboration with Dr. Kerrin, cardiothoracic surgery.  Recent notes reviewed.        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  Continue collaboration with Dr. Kerrin, cardiothoracic surgery.  Recent notes reviewed.        Endocrine   Type 2 diabetes mellitus with neurological complications (HCC) - Primary   Chronic, ongoing.  A1c 6.4% today, stable. Urine ALB 30 February 2025 -- continue Valsartan  for kidney protection.  Recommend he check blood sugar daily and document for provider visits.  Continue current medication regimen and  adjust as needed.  Diabetic diet at home.  Recheck A1c in 6 months. - Foot exam up to date - Eye exam up to date. - Vaccines up to date - ARB and statin on board      Relevant Orders   Bayer DCA Hb A1c Waived   Hyperlipidemia associated with type 2 diabetes mellitus (HCC)   Chronic, ongoing.  Continue current medication regimen and adjust as needed.  Lipid panel today.  Start Zetia  if elevations above goals for stroke prevention.      Relevant Orders   Bayer DCA Hb A1c Waived   Comprehensive metabolic panel with GFR   Lipid Panel w/o Chol/HDL Ratio   Diabetes mellitus treated with oral medication (HCC)   Refer to diabetes with neurological issues plan of care.      Relevant Orders   Bayer DCA Hb A1c Waived     Other   Vitamin D  deficiency   Chronic, ongoing.  Continue supplement and adjust as needed.  Check levels today.      Relevant Orders   VITAMIN D  25 Hydroxy (Vit-D Deficiency, Fractures)   History of cerebellar stroke   On 09/18/20, followed by neurology in past.  Will work on goals for stroke prevention -- A1c <6.5%, LDL <55, and BP <130/80.  Discussed with patient.  Last neurology notes reviewed. Return to them as needed.      Other Visit Diagnoses       Benign prostatic hyperplasia without lower urinary tract symptoms       PSA on labs today.   Relevant Orders   PSA     Colon cancer screening       Cologuard ordered per patient request and educated on this.   Relevant Orders   Cologuard     Encounter for annual physical exam       Annual physical today with labs and health maintenance reviewed, discussed with patient.  Discussed aspirin  prophylaxis for myocardial infarction prevention and decision was made to continue ASA  LABORATORY TESTING:  Health maintenance labs ordered today as discussed above.   The natural history of prostate cancer and ongoing controversy regarding screening and potential treatment outcomes of prostate cancer has been  discussed with the patient. The meaning of a false positive PSA and a false negative PSA has been discussed. He indicates understanding of the limitations of this screening test and wishes to proceed with screening PSA testing.   IMMUNIZATIONS:   - Tdap: Tetanus vaccination status reviewed: last tetanus booster within 10 years. - Influenza: Up to date - Pneumovax: Up to date - Prevnar: Up to date - Zostavax vaccine: Up to date  SCREENING: - Colonoscopy: Cologuard ordered per patient preference, discussed this with him  Discussed with patient purpose of the colonoscopy is to detect colon cancer at curable precancerous or early stages   - AAA Screening: Not applicable  -Hearing Test: Not applicable  -Spirometry: Not applicable   PATIENT COUNSELING:    Sexuality: Discussed sexually transmitted diseases, partner selection, use of condoms, avoidance of unintended pregnancy  and contraceptive alternatives.   Advised to avoid cigarette smoking.  I discussed with the patient that most people either abstain from alcohol or drink within safe limits (<=14/week and <=4 drinks/occasion for males, <=7/weeks and <= 3 drinks/occasion for females) and that the risk for alcohol disorders and other health effects rises proportionally with the number of drinks per week and how often a drinker exceeds daily limits.  Discussed cessation/primary prevention of drug use and availability of treatment for abuse.   Diet: Encouraged to adjust caloric intake to maintain  or achieve ideal body weight, to reduce intake of dietary saturated fat and total fat, to limit sodium intake by avoiding high sodium foods and not adding table salt, and to maintain adequate dietary potassium and calcium  preferably from fresh fruits, vegetables, and low-fat dairy products.    Stressed the importance of regular exercise  Injury prevention: Discussed safety belts, safety helmets, smoke detector, smoking near bedding or upholstery.    Dental health: Discussed importance of regular tooth brushing, flossing, and dental visits.   Follow up plan: NEXT PREVENTATIVE PHYSICAL DUE IN 1 YEAR. Return in about 6 months (around 03/23/2024) for T2DM, HTN/HLD, SARCOIDOSIS.

## 2023-09-21 NOTE — Assessment & Plan Note (Signed)
 Refer to diabetes with neurological issues plan of care.

## 2023-09-22 ENCOUNTER — Ambulatory Visit: Payer: Self-pay | Admitting: Nurse Practitioner

## 2023-09-22 LAB — CBC WITH DIFFERENTIAL/PLATELET
Basophils Absolute: 0 x10E3/uL (ref 0.0–0.2)
Basos: 1 %
EOS (ABSOLUTE): 0.1 x10E3/uL (ref 0.0–0.4)
Eos: 3 %
Hematocrit: 40.6 % (ref 37.5–51.0)
Hemoglobin: 13.7 g/dL (ref 13.0–17.7)
Immature Grans (Abs): 0 x10E3/uL (ref 0.0–0.1)
Immature Granulocytes: 0 %
Lymphocytes Absolute: 1.5 x10E3/uL (ref 0.7–3.1)
Lymphs: 34 %
MCH: 31.5 pg (ref 26.6–33.0)
MCHC: 33.7 g/dL (ref 31.5–35.7)
MCV: 93 fL (ref 79–97)
Monocytes Absolute: 0.6 x10E3/uL (ref 0.1–0.9)
Monocytes: 13 %
Neutrophils Absolute: 2.1 x10E3/uL (ref 1.4–7.0)
Neutrophils: 49 %
Platelets: 311 x10E3/uL (ref 150–450)
RBC: 4.35 x10E6/uL (ref 4.14–5.80)
RDW: 12.7 % (ref 11.6–15.4)
WBC: 4.3 x10E3/uL (ref 3.4–10.8)

## 2023-09-22 LAB — COMPREHENSIVE METABOLIC PANEL WITH GFR
ALT: 22 IU/L (ref 0–44)
AST: 22 IU/L (ref 0–40)
Albumin: 4.7 g/dL (ref 3.9–4.9)
Alkaline Phosphatase: 49 IU/L (ref 44–121)
BUN/Creatinine Ratio: 14 (ref 10–24)
BUN: 14 mg/dL (ref 8–27)
Bilirubin Total: 0.4 mg/dL (ref 0.0–1.2)
CO2: 20 mmol/L (ref 20–29)
Calcium: 9.7 mg/dL (ref 8.6–10.2)
Chloride: 101 mmol/L (ref 96–106)
Creatinine, Ser: 1 mg/dL (ref 0.76–1.27)
Globulin, Total: 2.7 g/dL (ref 1.5–4.5)
Glucose: 108 mg/dL — ABNORMAL HIGH (ref 70–99)
Potassium: 4.3 mmol/L (ref 3.5–5.2)
Sodium: 136 mmol/L (ref 134–144)
Total Protein: 7.4 g/dL (ref 6.0–8.5)
eGFR: 86 mL/min/1.73 (ref >59)

## 2023-09-22 LAB — LIPID PANEL W/O CHOL/HDL RATIO
Cholesterol, Total: 104 mg/dL (ref 100–199)
HDL: 35 mg/dL — ABNORMAL LOW (ref >39)
LDL Chol Calc (NIH): 50 mg/dL (ref 0–99)
Triglycerides: 96 mg/dL (ref 0–149)
VLDL Cholesterol Cal: 19 mg/dL (ref 5–40)

## 2023-09-22 LAB — PSA: Prostate Specific Ag, Serum: 0.8 ng/mL (ref 0.0–4.0)

## 2023-09-22 LAB — TSH: TSH: 2.03 u[IU]/mL (ref 0.450–4.500)

## 2023-09-22 LAB — VITAMIN D 25 HYDROXY (VIT D DEFICIENCY, FRACTURES): Vit D, 25-Hydroxy: 54.5 ng/mL (ref 30.0–100.0)

## 2023-09-22 NOTE — Progress Notes (Signed)
 Contacted via MyChart  Good afternoon Malik Jones, your labs have returned and overall I have no concerns on these.  They look great.  Any questions? Keep being amazing!!  Thank you for allowing me to participate in your care.  I appreciate you. Kindest regards, Daren Yeagle

## 2023-11-02 ENCOUNTER — Ambulatory Visit
Admission: RE | Admit: 2023-11-02 | Discharge: 2023-11-02 | Disposition: A | Discharge status: Home / Self Care | Source: Ambulatory Visit | Attending: Thoracic Surgery (Cardiothoracic Vascular Surgery) | Admitting: Thoracic Surgery (Cardiothoracic Vascular Surgery)

## 2023-11-02 DIAGNOSIS — J9859 Other diseases of mediastinum, not elsewhere classified: Secondary | ICD-10-CM

## 2023-11-02 DIAGNOSIS — D869 Sarcoidosis, unspecified: Secondary | ICD-10-CM | POA: Diagnosis not present

## 2023-11-09 ENCOUNTER — Ambulatory Visit

## 2023-11-09 VITALS — BP 132/78 | HR 70 | Resp 18 | Ht 65.0 in | Wt 151.0 lb

## 2023-11-09 DIAGNOSIS — R590 Localized enlarged lymph nodes: Secondary | ICD-10-CM

## 2023-11-09 DIAGNOSIS — J9859 Other diseases of mediastinum, not elsewhere classified: Secondary | ICD-10-CM

## 2023-11-09 NOTE — Progress Notes (Signed)
 [address] [place] [address]             [id number]            Lenell Lama [id number] 1960-10-02   History of Present Illness:  Malik Jones is a 63 year old man with a history of hypertension, hyperlipidemia, stroke, ascending aortic thrombus, diabetes, mediastinal adenopathy, pulmonary sarcoidosis, and an anterior mediastinal nodule. He was found to have nodularity in both lungs, mediastinal adenopathy and an anterior mediastinal nodule in 2022.  He has been followed by our clinic since then.    He reports that he has been doing well. He follows up regurarly with his PCP for his chronic health conditions.  He denies shortness of breath, chest pain and lower leg swelling.     Current Outpatient Medications on File Prior to Visit  Medication Sig Dispense Refill   amLODipine  (NORVASC ) 10 MG tablet Take 1 tablet (10 mg total) by mouth daily. 90 tablet 4   atorvastatin  (LIPITOR) 80 MG tablet Take 1 tablet (80 mg total) by mouth daily. 90 tablet 4   Blood Glucose Monitoring Suppl (ONE TOUCH ULTRA 2) w/Device KIT 1 each by Does not apply route daily at 2 PM. 1 kit 0   ezetimibe  (ZETIA ) 10 MG tablet Take 1 tablet (10 mg total) by mouth daily. 90 tablet 4   glucose blood (ONETOUCH ULTRA) test strip 1 each by Other route daily at 2 PM. Use as instructed 100 each 3   Lancets (ONETOUCH ULTRASOFT) lancets 1 each by Other route daily at 2 PM. Use as instructed 100 each 3   metFORMIN  (GLUCOPHAGE ) 500 MG tablet Take 1 tablet (500 mg total) by mouth daily with breakfast. .need 90 tablet 4   valsartan -hydrochlorothiazide  (DIOVAN -HCT) 80-12.5 MG tablet Take 1 tablet by mouth daily. 90 tablet 4   No current facility-administered medications on file prior to visit.     ROS: Review of Systems  Respiratory: Negative.  Negative for cough and shortness of breath.   Cardiovascular: Negative.  Negative for chest pain and leg swelling.     BP 132/78 (BP  Location: Right Arm)   Pulse 70   Resp 18   Ht 5' 5 (1.651 m)   Wt 151 lb (68.5 kg)   SpO2 98%   BMI 25.13 kg/m   Physical Exam Constitutional:      Appearance: Normal appearance.  HENT:     Head: Normocephalic and atraumatic.  Cardiovascular:     Rate and Rhythm: Normal rate and regular rhythm.     Heart sounds: Normal heart sounds, S1 normal and S2 normal.  Pulmonary:     Effort: Pulmonary effort is normal.     Breath sounds: Normal breath sounds.  Skin:    General: Skin is warm and dry.  Neurological:     General: No focal deficit present.     Mental Status: He is alert and oriented to person, place, and time.      Imaging: CLINICAL DATA:  Anterior mediastinal mass.  Sarcoidosis.   EXAM: CT CHEST WITHOUT CONTRAST   TECHNIQUE: Multidetector CT imaging of the chest was performed following the standard protocol without IV contrast.   RADIATION DOSE REDUCTION: This exam was performed according to the departmental dose-optimization program which includes automated exposure control, adjustment of the mA and/or kV according to patient size and/or use of iterative reconstruction technique.   COMPARISON:  10/20/2022   FINDINGS:  Cardiovascular: Coronary, aortic arch, and branch vessel atherosclerotic vascular disease.   Mediastinum/Nodes: Calcified AP window lymph nodes up to 1.0 cm in diameter similar to previous. Additional calcified paratracheal and left hilar lymph nodes compatible with old granulomatous disease. A noncalcified right paratracheal node measures 0.8 cm in short axis on image 46 series 2, formerly 0.9 cm.   Oval-shaped anterior mediastinal structure favoring a prevascular lymph node measures 0.9 cm in short axis, stable back through 09/11/2020. No other anterior mediastinal findings.   Lungs/Pleura: Bilateral scarring in both upper lobes. Unchanged confluent reticular and faintly reticulonodular scattered opacities in the lungs compatible with  pulmonary manifestations of sarcoidosis.   Upper Abdomen: Stable partially exophytic hypodense lesion of the lateral segment left hepatic lobe anteriorly measuring about 2.6 by 1.5 cm on image 142 series 2. This was shown to be a hemangioma on 03/27/2021 MRI.   Atheromatous vascular calcification in the proximal superior mesenteric artery.   Musculoskeletal: Unremarkable   IMPRESSION: 1. Stable appearance of the anterior mediastinal structure favoring a prevascular lymph node measuring 0.9 cm in short axis. This is stable back through 09/11/2020. Other scattered upper normal lymph nodes are present in the mediastinum and left hilum, some calcified. 2. Stable pulmonary manifestations of sarcoidosis. 3. Stable partially exophytic hypodense lesion of the lateral segment left hepatic lobe anteriorly measuring about 2.6 by 1.5 cm. This was shown to be a hemangioma on 03/27/2021 MRI. 4. Aortic Atherosclerosis (ICD10-I70.0). Coronary and systemic atherosclerosis.     Electronically Signed   By: Ryan Salvage M.D.   On: 11/02/2023 14:31     A/P: Mediastinal mass and Mediastinal adenopathy -There is no significant change in the anterior mediastinal nodule or in the mediastinal lymph nodes.   -Pulmonary nodularity with sarcoidosis is stable -Follow up in one year with CT of chest and virtual visit for continued monitoring of mediastinal mass and adenopathy     Manuelita CHRISTELLA Rough, PA-C 11/09/23

## 2023-11-09 NOTE — Patient Instructions (Signed)
-  Follow up in one year with CT of chest and virtual visit

## 2024-03-23 ENCOUNTER — Ambulatory Visit: Admitting: Nurse Practitioner
